# Patient Record
Sex: Male | Born: 1955 | Race: White | Hispanic: No | Marital: Married | Smoking: Former smoker
Health system: Southern US, Community
[De-identification: ages and names within clinical notes are randomized; demographics above are authoritative.]

## PROBLEM LIST (undated history)

## (undated) DIAGNOSIS — H269 Unspecified cataract: Secondary | ICD-10-CM

## (undated) DIAGNOSIS — Z8249 Family history of ischemic heart disease and other diseases of the circulatory system: Secondary | ICD-10-CM

## (undated) DIAGNOSIS — E119 Type 2 diabetes mellitus without complications: Secondary | ICD-10-CM

## (undated) DIAGNOSIS — G43909 Migraine, unspecified, not intractable, without status migrainosus: Secondary | ICD-10-CM

## (undated) DIAGNOSIS — E785 Hyperlipidemia, unspecified: Secondary | ICD-10-CM

## (undated) DIAGNOSIS — R7303 Prediabetes: Secondary | ICD-10-CM

## (undated) DIAGNOSIS — I739 Peripheral vascular disease, unspecified: Secondary | ICD-10-CM

## (undated) DIAGNOSIS — R569 Unspecified convulsions: Secondary | ICD-10-CM

## (undated) DIAGNOSIS — Z85828 Personal history of other malignant neoplasm of skin: Secondary | ICD-10-CM

## (undated) DIAGNOSIS — I10 Essential (primary) hypertension: Secondary | ICD-10-CM

## (undated) DIAGNOSIS — M1990 Unspecified osteoarthritis, unspecified site: Secondary | ICD-10-CM

## (undated) HISTORY — DX: Prediabetes: R73.03

## (undated) HISTORY — DX: Personal history of other malignant neoplasm of skin: Z85.828

## (undated) HISTORY — DX: Type 2 diabetes mellitus without complications: E11.9

## (undated) HISTORY — DX: Hyperlipidemia, unspecified: E78.5

## (undated) HISTORY — DX: Essential (primary) hypertension: I10

## (undated) HISTORY — DX: Peripheral vascular disease, unspecified: I73.9

## (undated) HISTORY — DX: Migraine, unspecified, not intractable, without status migrainosus: G43.909

## (undated) HISTORY — DX: Unspecified cataract: H26.9

---
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
## 1992-02-16 HISTORY — PX: SPINE SURGERY: SHX786

## 2010-04-14 DIAGNOSIS — E269 Hyperaldosteronism, unspecified: Secondary | ICD-10-CM | POA: Insufficient documentation

## 2011-10-30 ENCOUNTER — Ambulatory Visit: Payer: Managed Care, Other (non HMO) | Admitting: Emergency Medicine

## 2011-10-30 VITALS — BP 157/81 | HR 62 | Temp 97.5°F | Resp 16 | Ht 71.0 in | Wt 189.0 lb

## 2011-10-30 DIAGNOSIS — E119 Type 2 diabetes mellitus without complications: Secondary | ICD-10-CM

## 2011-10-30 DIAGNOSIS — I10 Essential (primary) hypertension: Secondary | ICD-10-CM

## 2011-10-30 LAB — COMPREHENSIVE METABOLIC PANEL
ALT: 23 U/L (ref 0–53)
BUN: 19 mg/dL (ref 6–23)
CO2: 25 mEq/L (ref 19–32)
Calcium: 9.9 mg/dL (ref 8.4–10.5)
Chloride: 107 mEq/L (ref 96–112)
Creat: 0.97 mg/dL (ref 0.50–1.35)

## 2011-10-30 LAB — LIPID PANEL
Cholesterol: 242 mg/dL — ABNORMAL HIGH (ref 0–200)
Total CHOL/HDL Ratio: 7.1 Ratio

## 2011-10-30 LAB — POCT GLYCOSYLATED HEMOGLOBIN (HGB A1C): Hemoglobin A1C: 5.2

## 2011-10-30 MED ORDER — LISINOPRIL 40 MG PO TABS: 40.0000 mg | ORAL_TABLET | Freq: Every day | ORAL | Status: DC

## 2011-10-30 MED ORDER — SPIRONOLACTONE 50 MG PO TABS: 50.0000 mg | ORAL_TABLET | Freq: Every day | ORAL | Status: DC

## 2011-10-30 MED ORDER — HYDRALAZINE HCL 50 MG PO TABS: 50.0000 mg | ORAL_TABLET | Freq: Three times a day (TID) | ORAL | Status: DC

## 2011-10-30 MED ORDER — METOPROLOL SUCCINATE ER 100 MG PO TB24: 100.0000 mg | ORAL_TABLET | Freq: Every day | ORAL | Status: DC

## 2011-10-30 MED ORDER — METFORMIN HCL 500 MG PO TABS: 500.0000 mg | ORAL_TABLET | Freq: Two times a day (BID) | ORAL | Status: DC

## 2011-10-30 NOTE — Progress Notes (Addendum)
   Date:  10/30/2011   Name:  Kyle [name]   DOB:  1955/11/17   MRN:  [id number] Gender: male Age: 56 y.o.  PCP:  No primary provider on file.    Chief Complaint: Establish Care and Hypertension   History of Present Illness:  Kyle [name] is a 56 y.o. pleasant patient who presents with the following:  History of "borderline" NIDDM and hypertension.  Wants to change from his current doctor that is "too far away".  Says that his blood pressure is always a little high.  Says that he had a normal A1C on his last testing one year ago.  Does not follow a particular diet and does not check his sugar at home.  Denies symptoms.  Reformed smoker.  There is no problem list on file for this patient.   No past medical history on file.  No past surgical history on file.  History  Substance Use Topics  . Smoking status: Former Games developer  . Smokeless tobacco: Not on file  . Alcohol Use: Not on file    No family history on file.  No Known Allergies  Medication list has been reviewed and updated.  No outpatient prescriptions prior to visit.    Review of Systems:  As per HPI, otherwise negative.    Physical Examination: Filed Vitals:   10/30/11 0947  BP: 157/81  Pulse: 62  Temp: 97.5 F (36.4 C)  Resp: 16   Filed Vitals:   10/30/11 0947  Height: 5\' 11"  (1.803 m)  Weight: 189 lb (85.73 kg)   Body mass index is 26.36 kg/(m^2). Ideal Body Weight: Weight in (lb) to have BMI = 25: 178.9   GEN: WDWN, NAD, Non-toxic, A & O x 3 HEENT: Atraumatic, Normocephalic. Neck supple. No masses, No LAD. Ears and Nose: No external deformity. CV: RRR, No M/G/R. No JVD. No thrill. No extra heart sounds. PULM: CTA B, no wheezes, crackles, rhonchi. No retractions. No resp. distress. No accessory muscle use. ABD: S, NT, ND, +BS. No rebound. No HSM. EXTR: No c/c/e NEURO Normal gait.  PSYCH: Normally interactive. Conversant. Not depressed or anxious appearing.  Calm demeanor.     Assessment and Plan:  Hypertension NIDDM Discussed importance of smoking cessation maintenance, dietary factors in NIDDM, and need to check his sugar daily and follow a diet. Refilled meds Changed to Toprol XL from lopressor Follow up in three months.   Carmelina Dane, MD I have reviewed and agree with documentation. Robert P. Merla Riches, M.D.

## 2011-11-02 MED ORDER — ROSUVASTATIN CALCIUM 20 MG PO TABS: 20.0000 mg | ORAL_TABLET | Freq: Every day | ORAL | Status: DC

## 2011-11-03 ENCOUNTER — Other Ambulatory Visit: Payer: Self-pay

## 2011-11-03 MED ORDER — HYDRALAZINE HCL 50 MG PO TABS: 50.0000 mg | ORAL_TABLET | Freq: Three times a day (TID) | ORAL | Status: DC

## 2011-11-03 MED ORDER — METOPROLOL SUCCINATE ER 100 MG PO TB24: 100.0000 mg | ORAL_TABLET | Freq: Every day | ORAL | Status: DC

## 2011-11-03 MED ORDER — METFORMIN HCL 500 MG PO TABS: 500.0000 mg | ORAL_TABLET | Freq: Two times a day (BID) | ORAL | Status: DC

## 2011-11-03 MED ORDER — SPIRONOLACTONE 50 MG PO TABS: 50.0000 mg | ORAL_TABLET | Freq: Every day | ORAL | Status: DC

## 2011-11-03 MED ORDER — LISINOPRIL 40 MG PO TABS: 40.0000 mg | ORAL_TABLET | Freq: Every day | ORAL | Status: DC

## 2011-11-07 ENCOUNTER — Telehealth: Payer: Self-pay

## 2011-11-07 NOTE — Telephone Encounter (Signed)
Pt states that medication was sent in mail for crestor, but dr was supposed to had prescribed another type of blood pressure medication. [phone]

## 2011-11-07 NOTE — Telephone Encounter (Signed)
Yes, his lopressor was changed to Toprol XL (metoprolol succinate) and that was sent to [facility] on 9/18.

## 2011-11-08 NOTE — Telephone Encounter (Signed)
Called pt unable to leave message.

## 2011-11-11 NOTE — Telephone Encounter (Signed)
Dr Dareen Piano, please see notes under pt's lab results. Pt stated he can not take Crestor or Lipitor d/t leg cramps and would like you to Rx something else.

## 2011-12-31 ENCOUNTER — Telehealth: Payer: Self-pay

## 2011-12-31 NOTE — Telephone Encounter (Signed)
Pt is needing nurse to contact him with his results, numbers when he had an A1C done, pt needs this info for personal purposes. [phone]

## 2011-12-31 NOTE — Telephone Encounter (Signed)
Patient notified and voiced understanding. Also requested copy mailed.

## 2012-01-20 ENCOUNTER — Telehealth: Payer: Self-pay

## 2012-01-20 NOTE — Telephone Encounter (Signed)
Pt is needing to talk about changing his medications because at beginning of year price will increase for blood pressure

## 2012-01-21 ENCOUNTER — Telehealth: Payer: Self-pay | Admitting: Family Medicine

## 2012-01-21 NOTE — Telephone Encounter (Signed)
Mr Kaushik called to speak with Amy about RX but will call to speak with her tomorrow

## 2012-01-21 NOTE — Telephone Encounter (Signed)
Left message for pt. To call back about this.

## 2012-01-22 NOTE — Telephone Encounter (Signed)
Pt called to speak with amy about his medication [phone]

## 2012-01-23 ENCOUNTER — Telehealth: Payer: Self-pay

## 2012-01-23 MED ORDER — METOPROLOL TARTRATE 50 MG PO TABS: 50.0000 mg | ORAL_TABLET | Freq: Two times a day (BID) | ORAL | Status: DC

## 2012-01-23 NOTE — Telephone Encounter (Signed)
Spoke with pt advised message from [place]. Pt understood and will recheck next Saturday.

## 2012-01-23 NOTE — Telephone Encounter (Signed)
Taking Toprol XL, through [facility]. Has gotten letter this medication not covered as of Jan 1st please advise on changing medication, they state they will cover regular Metoprolol, but no longer the Toprol XL

## 2012-01-23 NOTE — Telephone Encounter (Signed)
Please advise patient change has been made. They will need a recheck before they run out. Have them check their BP periodically on the new medication also, if it is running too high or too low RTC.

## 2012-01-23 NOTE — Telephone Encounter (Signed)
Patients wife called in regards to RX [facility]- [facility]- patient needs a low generic or low cost brand to replace it and have it covered by the insurance. Spoke to Amy a couple days ago. Patients wife just wants to get it resolved as soon as possible before his appointment. Thank you!

## 2012-02-06 ENCOUNTER — Other Ambulatory Visit: Payer: Self-pay | Admitting: Physician Assistant

## 2012-02-12 ENCOUNTER — Ambulatory Visit: Payer: Managed Care, Other (non HMO)

## 2012-03-17 ENCOUNTER — Ambulatory Visit: Payer: Managed Care, Other (non HMO) | Admitting: Emergency Medicine

## 2012-03-17 VITALS — BP 146/88 | HR 77 | Temp 98.0°F | Resp 16 | Ht 70.5 in | Wt 189.0 lb

## 2012-03-17 DIAGNOSIS — E782 Mixed hyperlipidemia: Secondary | ICD-10-CM

## 2012-03-17 DIAGNOSIS — E785 Hyperlipidemia, unspecified: Secondary | ICD-10-CM | POA: Insufficient documentation

## 2012-03-17 DIAGNOSIS — I10 Essential (primary) hypertension: Secondary | ICD-10-CM | POA: Insufficient documentation

## 2012-03-17 DIAGNOSIS — E119 Type 2 diabetes mellitus without complications: Secondary | ICD-10-CM | POA: Insufficient documentation

## 2012-03-17 LAB — BASIC METABOLIC PANEL
CO2: 28 mEq/L (ref 19–32)
Chloride: 106 mEq/L (ref 96–112)
Creat: 1.07 mg/dL (ref 0.50–1.35)
Potassium: 4.4 mEq/L (ref 3.5–5.3)

## 2012-03-17 LAB — POCT CBC
Granulocyte percent: 56 %G (ref 37–80)
HCT, POC: 45.5 % (ref 43.5–53.7)
Hemoglobin: 14.9 g/dL (ref 14.1–18.1)
Lymph, poc: 4.8 — AB (ref 0.6–3.4)
MCV: 91.4 fL (ref 80–97)
POC Granulocyte: 7.3 — AB (ref 2–6.9)
POC LYMPH PERCENT: 37.3 %L (ref 10–50)
RDW, POC: 13.9 %

## 2012-03-17 LAB — POCT GLYCOSYLATED HEMOGLOBIN (HGB A1C): Hemoglobin A1C: 5.6

## 2012-03-17 LAB — GLUCOSE, POCT (MANUAL RESULT ENTRY): POC Glucose: 62 mg/dl — AB (ref 70–99)

## 2012-03-17 NOTE — Progress Notes (Signed)
  Subjective:    Patient ID: Kyle [name], male    DOB: 22-Feb-1955, 57 y.o.   MRN: [id number]  HPI Pt here for check up. He was here In Sept 2013. They drew blood then for his DM. He has been taking his meds and checks his sugars when he has the strips. HE is a truckdriver. He has never had a colonoscopy. Patient has been to the [facility] in the past with hypertensive crisis. At times he has not had money to pay for his medications. He currently has been made with the takes medication and is trying to do better with his help  Review of Systems     Objective:   Physical Exam HEENT exam is unremarkable. His chest is clear to auscultation and percussion. Heart regular rate no murmurs. Abdomen soft nontender. Extremities without edema pulses are 2+ and symmetrical.  Results for orders placed in visit on 03/17/12  POCT CBC      Component Value Range   WBC 13.0 (*) 4.6 - 10.2 K/uL   Lymph, poc 4.8 (*) 0.6 - 3.4   POC LYMPH PERCENT 37.3  10 - 50 %L   MID (cbc) 0.9  0 - 0.9   POC MID % 6.7  0 - 12 %M   POC Granulocyte 7.3 (*) 2 - 6.9   Granulocyte percent 56.0  37 - 80 %G   RBC 4.98  4.69 - 6.13 M/uL   Hemoglobin 14.9  14.1 - 18.1 g/dL   HCT, POC 40.9  81.1 - 53.7 %   MCV 91.4  80 - 97 fL   MCH, POC 29.9  27 - 31.2 pg   MCHC 32.7  31.8 - 35.4 g/dL   RDW, POC 91.4     Platelet Count, POC 296  142 - 424 K/uL   MPV 11.3  0 - 99.8 fL  GLUCOSE, POCT (MANUAL RESULT ENTRY)      Component Value Range   POC Glucose 62 (*) 70 - 99 mg/dl  POCT GLYCOSYLATED HEMOGLOBIN (HGB A1C)      Component Value Range   Hemoglobin A1C 5.6          Assessment & Plan:

## 2012-03-24 ENCOUNTER — Encounter: Payer: Self-pay | Admitting: *Deleted

## 2012-04-04 ENCOUNTER — Encounter (INDEPENDENT_AMBULATORY_CARE_PROVIDER_SITE_OTHER): Payer: Managed Care, Other (non HMO)

## 2012-04-04 ENCOUNTER — Ambulatory Visit (INDEPENDENT_AMBULATORY_CARE_PROVIDER_SITE_OTHER): Payer: Managed Care, Other (non HMO) | Admitting: Cardiology

## 2012-04-04 ENCOUNTER — Encounter: Payer: Self-pay | Admitting: Cardiology

## 2012-04-04 VITALS — BP 162/90 | HR 60 | Ht 70.5 in | Wt 193.0 lb

## 2012-04-04 DIAGNOSIS — I10 Essential (primary) hypertension: Secondary | ICD-10-CM

## 2012-04-04 DIAGNOSIS — E7800 Pure hypercholesterolemia, unspecified: Secondary | ICD-10-CM

## 2012-04-04 DIAGNOSIS — E785 Hyperlipidemia, unspecified: Secondary | ICD-10-CM

## 2012-04-04 DIAGNOSIS — I739 Peripheral vascular disease, unspecified: Secondary | ICD-10-CM

## 2012-04-04 DIAGNOSIS — Z9189 Other specified personal risk factors, not elsewhere classified: Secondary | ICD-10-CM

## 2012-04-04 MED ORDER — PRAVASTATIN SODIUM 20 MG PO TABS: 20.0000 mg | ORAL_TABLET | Freq: Every evening | ORAL | Status: DC

## 2012-04-04 MED ORDER — HYDRALAZINE HCL 100 MG PO TABS: 100.0000 mg | ORAL_TABLET | Freq: Three times a day (TID) | ORAL | Status: DC

## 2012-04-04 NOTE — Progress Notes (Signed)
Patient ID: Kyle [name], male   DOB: 04/03/1955, 57 y.o.   MRN: [id number] PCP: Dr. Cleta Alberts  57 yo with history of PAD, resistant HTN, type II diabetes, and hyperlipidemia presents for cardiology evaluation.  Patient has had a long history of HTN.  He was told by a doctor in [place] that he had hyperaldosteronism so was started on spironolactone.  SBP at home ranges from the 120s-160s when he checks on his home cuff.  It is high today.  Additionally, he used to get severe pain in the right calf with ambulation.  He was diagnosed with PAD noninvasively by a doctor in [place].  He says that he started a walking program and the pain considerably improved.  Currently, he really is not getting symptoms that would be concerning for claudication.  He has been on Crestor and Lipitor in the past for hyperlipidemia but has been unable to tolerate them due to myalgias.  With last lipid check, triglycerides were very high.  Fish oil was increased to 6000 mg daily.    Patient denies chest pain or exertional dyspnea.  He does not get formal exercise but does not have any limitations that he can think of.  He says he had a stress test > 10 years ago that was "normal."    ECG: NSR, normal  Labs (9/13): TGs 890, HDL 34 Labs (1/14): K 4.4, creatinine 1.07  PMH: 1. PAD: Diagnosed in [place], greater involvement of right leg.  I do not have full details of prior workup.  Never had peripheral angiogram.  2. HTN: Resistant.  He was told by a doctor in [place] that he had hyperaldosteronism and was started on spironolactone.  3. Type II diabetes 4. Hyperlipidemia: Leg cramps with Lipitor and Crestor.  Very high triglycerides.   SH: Truck driver Ambulance person), lives in [place].  Originally from [place].  Quit smoking in 2009.   FH: No premature CAD.  ROS: All systems reviewed and negative except as per HPI.   Current Outpatient Prescriptions  Medication Sig Dispense Refill  . aspirin 81 MG  tablet Take 81 mg by mouth daily.      . fish oil-omega-3 fatty acids 1000 MG capsule Take 2 g by mouth daily.      Marland Kitchen lisinopril (PRINIVIL,ZESTRIL) 40 MG tablet Take 1 tablet (40 mg total) by mouth daily.  90 tablet  0  . metFORMIN (GLUCOPHAGE) 500 MG tablet Take 1 tablet (500 mg total) by mouth 2 (two) times daily with a meal.  180 tablet  0  . metoprolol succinate (TOPROL-XL) 100 MG 24 hr tablet Take 1 tablet (100 mg total) by mouth daily. Take with or immediately following a meal.  90 tablet  0  . spironolactone (ALDACTONE) 50 MG tablet Take 1 tablet (50 mg total) by mouth daily. Need office visit for additional refills.  90 tablet  0  . Coenzyme Q10 200 MG TABS Take by mouth.      . hydrALAZINE (APRESOLINE) 100 MG tablet Take 1 tablet (100 mg total) by mouth 3 (three) times daily.  270 tablet  2  . pravastatin (PRAVACHOL) 20 MG tablet Take 1 tablet (20 mg total) by mouth every evening.  90 tablet  3   No current facility-administered medications for this visit.    BP 162/90  Pulse 60  Ht 5' 10.5" (1.791 m)  Wt 193 lb (87.544 kg)  BMI 27.29 kg/m2 General: NAD Neck: No JVD, no thyromegaly or thyroid nodule.  Lungs: Clear  to auscultation bilaterally with normal respiratory effort. CV: Nondisplaced PMI.  Heart regular S1/S2, no S3/S4, no murmur.  No peripheral edema.  No carotid bruit.  Trace right PT pulse, 2+ left PT pulse.   Abdomen: Soft, nontender, no hepatosplenomegaly, no distention.  Skin: Intact without lesions or rashes.  Neurologic: Alert and oriented x 3.  Psych: Normal affect. Extremities: No clubbing or cyanosis.  HEENT: Normal.   Assessment/Plan: 1. PAD: Patient has history of PAD with no recent evaluation.   He used to have significant claudication but this has improved considerably.  He is no longer smoking.  The pulses in his right foot are considerably weaker than the left foot.  - I will get ABIs to assess degree of PAD. - Continue ASA, needs to start statin given  vascular disease history.  2. HTN: Resistant.  He is on multiple meds but BP still tends to run high.  He has been told that he has hyperaldosteronism and is on spironolactone.   - Increase hydralazine to 100 mg tid.  - Check renal artery dopplers to rule out renal artery stenosis.  3. Hyperlipidemia: Should be on a statin with known vascular disease but has had myalgias with Lipitor and Crestor.  I will have him try a low dose of pravastatin (20 mg daily) along with coenzyme Q10 200 mg daily to see if he can tolerate this.  He recently increased fish oil for very high triglycerides.  The level of hypertriglyceridemia that he had risks acute pancreatitis.  I will repeat lipids/LFTs in 2 months.  If TGs still high, will need to add fenofibrate.   4. CAD: Multiple CAD risk factors (PAD, HTN, hyperlipidemia, DM).  I will get an ETT (no imaging) to risk stratify. I would like him to exercise more.  If ETT looks ok, he should try to start a regular walking program.   Marca Ancona 04/04/2012 2:37 PM

## 2012-04-04 NOTE — Patient Instructions (Addendum)
Increase hydralazine to 100mg  three times a day.   Start pravastatin (pravachol) 20mg  daily.   Take coenzyme Q10 200mg  daily.   Your physician has requested that you have an exercise tolerance test. For further information please visit [email or web address] Please also follow instruction sheet, as given.  Your physician has requested that you have a lower extremity arterial duplex. This test is an ultrasound of the arteries in the legs . It looks at arterial blood flow in the legs. Allow one hour for Lower  Arterial scans. There are no restrictions or special instructions.  Your physician has requested that you have a renal artery duplex. During this test, an ultrasound is used to evaluate blood flow to the kidneys. Allow one hour for this exam. Do not eat after midnight the day before and avoid carbonated beverages. Take your medications as you usually do.    Your physician recommends that you return for a FASTING lipid profile /liver profile/BMEt in 2 months.  Your physician wants you to follow-up in: 6 months with Dr Shirlee Latch. (August 2014). You will receive a reminder letter in the mail two months in advance. If you don't receive a letter, please call our office to schedule the follow-up appointment.

## 2012-04-09 ENCOUNTER — Other Ambulatory Visit: Payer: Self-pay | Admitting: Physician Assistant

## 2012-04-10 ENCOUNTER — Telehealth: Payer: Self-pay | Admitting: Cardiology

## 2012-04-12 ENCOUNTER — Encounter (INDEPENDENT_AMBULATORY_CARE_PROVIDER_SITE_OTHER): Payer: Managed Care, Other (non HMO)

## 2012-04-12 DIAGNOSIS — E7800 Pure hypercholesterolemia, unspecified: Secondary | ICD-10-CM

## 2012-04-12 DIAGNOSIS — I10 Essential (primary) hypertension: Secondary | ICD-10-CM

## 2012-04-12 DIAGNOSIS — I739 Peripheral vascular disease, unspecified: Secondary | ICD-10-CM

## 2012-04-16 ENCOUNTER — Other Ambulatory Visit: Payer: Self-pay | Admitting: Physician Assistant

## 2012-04-17 ENCOUNTER — Encounter: Payer: Self-pay | Admitting: Nurse Practitioner

## 2012-04-17 ENCOUNTER — Ambulatory Visit (INDEPENDENT_AMBULATORY_CARE_PROVIDER_SITE_OTHER): Payer: Managed Care, Other (non HMO) | Admitting: Nurse Practitioner

## 2012-04-17 VITALS — BP 175/80 | HR 83

## 2012-04-17 DIAGNOSIS — E7800 Pure hypercholesterolemia, unspecified: Secondary | ICD-10-CM

## 2012-04-17 DIAGNOSIS — I739 Peripheral vascular disease, unspecified: Secondary | ICD-10-CM

## 2012-04-17 DIAGNOSIS — I10 Essential (primary) hypertension: Secondary | ICD-10-CM

## 2012-04-17 DIAGNOSIS — I472 Ventricular tachycardia, unspecified: Secondary | ICD-10-CM

## 2012-04-17 MED ORDER — AMLODIPINE BESYLATE 5 MG PO TABS: 5.0000 mg | ORAL_TABLET | Freq: Every day | ORAL | Status: DC

## 2012-04-17 NOTE — Progress Notes (Signed)
Exercise Treadmill Test  Pre-Exercise Testing Evaluation Rhythm: normal sinus  Rate: 64                 Test  Exercise Tolerance Test Ordering MD: Marca Ancona, MD  Interpreting MD: Norma Fredrickson, NP  Unique Test No: 1  Treadmill:  1  Indication for ETT: Ischemia Evaluation, PVD  Contraindication to ETT: No   Stress Modality: exercise - treadmill  Cardiac Imaging Performed: non   Protocol: standard Bruce - maximal  Max BP:  243/99  Max MPHR (bpm):  162 85% MPR (bpm):  138  MPHR obtained (bpm):  141 % MPHR obtained:  87%  Reached 85% MPHR (min:sec):  n/a Total Exercise Time (min-sec):  3:42  Workload in METS:  5.4 Borg Scale: 13  Reason ETT Terminated:  exaggerated hypertensive response    ST Segment Analysis At Rest: normal ST segments - no evidence of significant ST depression With Exercise: no evidence of significant ST depression  Other Information Arrhythmia:  Yes Angina during ETT:  absent (0) Quality of ETT:  non-diagnostic  ETT Interpretation:  normal - no evidence of ischemia by ST analysis but with poor exercise tolerance, hypertensive response and frequent PVC's and short runs (3 to 4 beats)  Comments: Patient presents today for routine GXT. Has no known CAD but multiple CV risk factors. No smoking since 2009.  Patient exercised today on the standard Bruce protocol. He exercised for only 3:42. Test was stopped by myself due to marked hypertension and PVC's. Reduced exercise tolerance. Marked hypertensive BP response. Clinically negative. EKG negative for ischemia but with PVCs and short 3 to 4 beat runs.   Recommendations: Will arrange for Lexiscan - not to hold his beta blocker Will arrange echo as well  See Dr. Shirlee Latch in one month  Addendum: At the end of the procedure, patient developed SVT - rate up to 200. Code Blue was called. Patient remains alert and responsive during the entire episode.  Rhythm broke with vagal maneuvers (coughing and bearing down).  BP remains markedly high (up to 215/138). Lopressor 50 mg given po. Will need to discuss with Dr. Shirlee Latch as to cath vs Myoview testing. BP has dropped to 199/104  Patient was subsequently seen by Dr. Shirlee Latch. He has advised Lexiscan. 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   Call the [facility] office at [phone] if you have any questions, problems or concerns.

## 2012-04-17 NOTE — Patient Instructions (Addendum)
We need to get an ultrasound of your heart and a stress test Kyle [name])  Do not hold your Toprol for this next stress test  We are going to add Norvasc 5 mg a day - take this along with your other medicines  Take your Toprol when you get home  See Dr. Shirlee Latch in one month  Call the [facility] office at [phone] if you have any questions, problems or concerns.

## 2012-04-23 ENCOUNTER — Other Ambulatory Visit: Payer: Self-pay | Admitting: Physician Assistant

## 2012-05-01 ENCOUNTER — Ambulatory Visit (HOSPITAL_COMMUNITY): Payer: Managed Care, Other (non HMO) | Attending: Cardiology

## 2012-05-01 DIAGNOSIS — I079 Rheumatic tricuspid valve disease, unspecified: Secondary | ICD-10-CM | POA: Insufficient documentation

## 2012-05-01 DIAGNOSIS — E119 Type 2 diabetes mellitus without complications: Secondary | ICD-10-CM | POA: Insufficient documentation

## 2012-05-01 DIAGNOSIS — I059 Rheumatic mitral valve disease, unspecified: Secondary | ICD-10-CM | POA: Insufficient documentation

## 2012-05-01 DIAGNOSIS — E7800 Pure hypercholesterolemia, unspecified: Secondary | ICD-10-CM

## 2012-05-01 DIAGNOSIS — I739 Peripheral vascular disease, unspecified: Secondary | ICD-10-CM

## 2012-05-01 DIAGNOSIS — I471 Supraventricular tachycardia, unspecified: Secondary | ICD-10-CM | POA: Insufficient documentation

## 2012-05-01 DIAGNOSIS — I10 Essential (primary) hypertension: Secondary | ICD-10-CM

## 2012-05-01 DIAGNOSIS — I472 Ventricular tachycardia, unspecified: Secondary | ICD-10-CM

## 2012-05-01 DIAGNOSIS — I379 Nonrheumatic pulmonary valve disorder, unspecified: Secondary | ICD-10-CM | POA: Insufficient documentation

## 2012-05-01 NOTE — Progress Notes (Signed)
Echocardiogram performed.  

## 2012-05-08 ENCOUNTER — Ambulatory Visit (HOSPITAL_COMMUNITY): Payer: Managed Care, Other (non HMO) | Attending: Cardiovascular Disease | Admitting: Radiology

## 2012-05-08 VITALS — BP 179/95 | HR 67 | Ht 70.5 in | Wt 185.0 lb

## 2012-05-08 DIAGNOSIS — R9439 Abnormal result of other cardiovascular function study: Secondary | ICD-10-CM

## 2012-05-08 DIAGNOSIS — I472 Ventricular tachycardia, unspecified: Secondary | ICD-10-CM

## 2012-05-08 DIAGNOSIS — I10 Essential (primary) hypertension: Secondary | ICD-10-CM

## 2012-05-08 DIAGNOSIS — E119 Type 2 diabetes mellitus without complications: Secondary | ICD-10-CM | POA: Insufficient documentation

## 2012-05-08 DIAGNOSIS — E7800 Pure hypercholesterolemia, unspecified: Secondary | ICD-10-CM

## 2012-05-08 DIAGNOSIS — I739 Peripheral vascular disease, unspecified: Secondary | ICD-10-CM

## 2012-05-08 DIAGNOSIS — R079 Chest pain, unspecified: Secondary | ICD-10-CM | POA: Insufficient documentation

## 2012-05-08 MED ORDER — TECHNETIUM TC 99M SESTAMIBI GENERIC - CARDIOLITE
33.0000 | Freq: Once | INTRAVENOUS | Status: AC | PRN
  Administered 2012-05-08: 33 via INTRAVENOUS

## 2012-05-08 MED ORDER — REGADENOSON 0.4 MG/5ML IV SOLN
0.4000 mg | Freq: Once | INTRAVENOUS | Status: AC
  Administered 2012-05-08: 0.4 mg via INTRAVENOUS

## 2012-05-08 MED ORDER — TECHNETIUM TC 99M SESTAMIBI GENERIC - CARDIOLITE
11.0000 | Freq: Once | INTRAVENOUS | Status: AC | PRN
  Administered 2012-05-08: 11 via INTRAVENOUS

## 2012-05-08 NOTE — Progress Notes (Signed)
[facility] SITE 3 NUCLEAR MED [address] [place], [place] [address] [phone]    Cardiology Nuclear Med Study  Kyle [name] is a 57 y.o. male     MRN : [id number]     DOB: 02-08-1956  Procedure Date: 05/08/2012  Nuclear Med Background Indication for Stress Test:  Evaluation for Ischemia and Abnormal GXT History:  3/14 Echo:EF=65%; 3/14 GXT: Cardiac Risk Factors: Claudication, History of Smoking, Hypertension, Lipids, NIDDM and PVD  Symptoms:  Chest Pressure with Rapid HR.  (last episode of chest discomfort was with GXT on 04/17/12 during SVT in recovery)    Nuclear Pre-Procedure Caffeine/Decaff Intake:  None NPO After: 7:00pm   Lungs:  Clear. O2 Sat: 99% on room air. IV 0.9% NS with Angio Cath:  20g  IV Site: R Antecubital  IV Started by:  Stanton Kidney, EMT-P  Chest Size (in):  42 Cup Size: n/a  Height: 5' 10.5" (1.791 m)  Weight:  185 lb (83.915 kg)  BMI:  Body mass index is 26.16 kg/(m^2). Tech Comments:  Metoprolol was taken at 7am, as directed.    Nuclear Med Study 1 or 2 day study: 1 day  Stress Test Type:  Eugenie Birks  Reading MD: Charlton Haws, MD  Order Authorizing Provider:  Marca Ancona, MD  Resting Radionuclide: Technetium 29m Sestamibi  Resting Radionuclide Dose: 11.0 mCi   Stress Radionuclide:  Technetium 29m Sestamibi  Stress Radionuclide Dose: 33.0 mCi           Stress Protocol Rest HR: 67 Stress HR: 110  Rest BP: 179/95 Stress BP: 181/83  Exercise Time (min): n/a METS: n/a   Predicted Max HR: 162 bpm % Max HR: 67.9 bpm Rate Pressure Product: 21308   Dose of Adenosine (mg):  n/a Dose of Lexiscan: 0.4 mg  Dose of Atropine (mg): n/a Dose of Dobutamine: n/a mcg/kg/min (at max HR)  Stress Test Technologist: Smiley Houseman, CMA-N  Nuclear Technologist:  Domenic Polite, CNMT     Rest Procedure:  Myocardial perfusion imaging was performed at rest 45 minutes following the intravenous administration of Technetium 36m Sestamibi.  Rest ECG:  NSR - Normal EKG  Stress Procedure:  The patient received IV Lexiscan 0.4 mg over 15-seconds.  Technetium 37m Sestamibi injected at 30-seconds.  He c/o chest pressure with Lexiscan, relieved in recovery.  Quantitative spect images were obtained after a 45 minute delay.  Stress ECG: No significant change from baseline ECG  QPS Raw Data Images:  Patient motion noted. Stress Images:  Normal homogeneous uptake in all areas of the myocardium. Rest Images:  Normal homogeneous uptake in all areas of the myocardium. Subtraction (SDS):  Normal Transient Ischemic Dilatation (Normal <1.22):  1.01 Lung/Heart Ratio (Normal <0.45):  0.29  Quantitative Gated Spect Images QGS EDV:  102 ml QGS ESV:  40 ml  Impression Exercise Capacity:  Lexiscan with no exercise. BP Response:  Normal blood pressure response. Clinical Symptoms:  There is dyspnea. ECG Impression:  No significant ST segment change suggestive of ischemia. Comparison with Prior Nuclear Study: No previous nuclear study performed  Overall Impression:  Normal stress nuclear study.  LV Ejection Fraction: 61%.  LV Wall Motion:  NL LV Function; NL Wall Motion  Charlton Haws

## 2012-05-10 ENCOUNTER — Telehealth: Payer: Self-pay | Admitting: Cardiology

## 2012-05-10 NOTE — Telephone Encounter (Signed)
New problem ° ° ° °Pt was returning your phone call. °

## 2012-05-10 NOTE — Telephone Encounter (Signed)
Spoke with pt

## 2012-05-20 ENCOUNTER — Ambulatory Visit: Payer: Managed Care, Other (non HMO)

## 2012-05-20 ENCOUNTER — Ambulatory Visit: Payer: Managed Care, Other (non HMO) | Admitting: Family Medicine

## 2012-05-20 VITALS — BP 149/75 | HR 79 | Temp 98.5°F | Resp 16 | Ht 71.5 in | Wt 187.0 lb

## 2012-05-20 DIAGNOSIS — M25569 Pain in unspecified knee: Secondary | ICD-10-CM

## 2012-05-20 DIAGNOSIS — M25562 Pain in left knee: Secondary | ICD-10-CM

## 2012-05-20 DIAGNOSIS — M25559 Pain in unspecified hip: Secondary | ICD-10-CM

## 2012-05-20 DIAGNOSIS — M25552 Pain in left hip: Secondary | ICD-10-CM

## 2012-05-20 MED ORDER — HYDROCODONE-ACETAMINOPHEN 5-325 MG PO TABS: 1.0000 | ORAL_TABLET | Freq: Three times a day (TID) | ORAL | Status: DC | PRN

## 2012-05-20 NOTE — Progress Notes (Signed)
[facility] [address] [place] [address] [phone]  Date:  05/20/2012   Name:  Kyle [name]   DOB:  [birth year]   MRN:  [id number]  PCP:  Carmelina Dane, MD    Chief Complaint: Knee Pain   History of Present Illness:  Kyle [name] is a 57 y.o. very pleasant male patient who presents with the following:  He is here with left knee pain for a few weeks- it has been hurting more for the last 3 days.  It hurts from the knee up to the left hip.  The right knee is ok.  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  He has not had this in the past.   The knee is painful even at rest- heat and ice do help some.  He will be quite stiff when he first rises and then loosen up with a few steps.  There is no known injury No past history of gout.  He has not noted fevers or chills.   He has had a discectomy in the past- this did help him.  His disc was causing pain in his left leg.    Patient Active Problem List  Diagnosis  . Hypertension  . Diabetes  . Hyperlipidemia  . PAD (peripheral artery disease)  . Cardiovascular risk factor    Past Medical History  Diagnosis Date  . History of basal cell carcinoma of skin     shoulder  . Prediabetes   . Hypertension   . HLD (hyperlipidemia)     Past Surgical History  Procedure Laterality Date  . Spine surgery  1994  . Tonsillectomy  1961    History  Substance Use Topics  . Smoking status: Former Smoker    Types: Cigarettes    Start date: 11/05/2007  . Smokeless tobacco: Not on file  . Alcohol Use: No    Family History  Problem Relation Age of Onset  . Diabetes Father     Allergies  Allergen Reactions  . Statins     Leg cramps    Medication list has been reviewed and updated.  Current Outpatient Prescriptions on File Prior to Visit  Medication Sig Dispense Refill  . amLODipine (NORVASC) 5 MG tablet Take 1 tablet (5 mg total) by mouth daily.  30 tablet  3  .  aspirin 81 MG tablet Take 81 mg by mouth daily.      . Coenzyme Q10 200 MG TABS Take by mouth.      . fish oil-omega-3 fatty acids 1000 MG capsule Take 2 g by mouth daily.      . hydrALAZINE (APRESOLINE) 100 MG tablet Take 1 tablet (100 mg total) by mouth 3 (three) times daily.  270 tablet  2  . lisinopril (PRINIVIL,ZESTRIL) 40 MG tablet TAKE 1 TABLET DAILY  90 tablet  0  . metFORMIN (GLUCOPHAGE) 500 MG tablet TAKE 1 TABLET TWICE A DAY WITH MEALS  180 tablet  1  . metoprolol (LOPRESSOR) 50 MG tablet Take 1 tablet (50 mg total) by mouth 2 (two) times daily. Need office visit for additional refills. 2nd notice.  180 tablet  0  . metoprolol succinate (TOPROL-XL) 100 MG 24 hr tablet Take 1 tablet (100 mg total) by mouth daily. Take with or immediately following a meal.  90 tablet  0  . spironolactone (ALDACTONE) 50 MG tablet Take 1 tablet (50 mg total) by mouth daily. Need office  visit for additional refills. 2nd notice.  90 tablet  0  . pravastatin (PRAVACHOL) 20 MG tablet Take 1 tablet (20 mg total) by mouth every evening.  90 tablet  3   No current facility-administered medications on file prior to visit.    Review of Systems:  As per HPI- otherwise negative.   Physical Examination: Filed Vitals:   05/20/12 1328  BP: 149/75  Pulse: 79  Temp: 98.5 F (36.9 C)  Resp: 16   Filed Vitals:   05/20/12 1328  Height: 5' 11.5" (1.816 m)  Weight: 187 lb (84.823 kg)   Body mass index is 25.72 kg/(m^2). Ideal Body Weight: Weight in (lb) to have BMI = 25: 181.4  GEN: WDWN, NAD, Non-toxic, A & O x 3 HEENT: Atraumatic, Normocephalic. Neck supple. No masses, No LAD. Ears and Nose: No external deformity. CV: RRR, No M/G/R. No JVD. No thrill. No extra heart sounds. PULM: CTA B, no wheezes, crackles, rhonchi. No retractions. No resp. distress. No accessory muscle use. EXTR: No c/c/e NEURO very stiff gait for the first few steps after sitting, then improves.  Slightly decreased patellar DTR on  the left which he relates is stable since his back surgery PSYCH: Normally interactive. Conversant. Not depressed or anxious appearing.  Calm demeanor.  Right knee: negative exam except for slight tenderness which is inconsitent.  The knee has full ROM, good flexion, no swelling/ edema/ effusion/ redness or heat.  Ligaments are stable The thigh also seems to have some tenderness which moves on re-exam.  There is no swelling or redness noted.   Left hip is non- tender over the greater trochanter, normal internal and external rotation and flexion.   [facility] reading (PRIMARY) by  Dr. Patsy Lager. Left ZOX:WRUE OA changes Left femur: negative Left knee:negative  LEFT KNEE - COMPLETE 4+ VIEW  Comparison: Femur film and hip films same date  Findings: Mild lateral patellofemoral joint space narrowing.  No fracture or dislocation.  Small sclerotic focus probably a bone island proximal tibia.  IMPRESSION: Mild lateral patellofemoral joint space narrowing.  No fracture or dislocation.  Small sclerotic focus probably a bone island proximal tibia.  LEFT FEMUR - 2 VIEW  Comparison: Hip films and knee films same date.  Findings: Mild left hip joint degenerative changes. No fracture or dislocation.  IMPRESSION: Mild left hip joint degenerative changes.  No fracture or dislocation.  LEFT HIP - COMPLETE 2+ VIEW  Comparison: None.  Findings: Mild left hip joint degenerative changes. No evidence of fracture or dislocation. No plain film evidence of avascular necrosis.  IMPRESSION: Mild left hip joint degenerative changes.   Assessment and Plan: Pain in left knee - Plan: DG Femur Left, DG Knee Complete 4 Views Left, HYDROcodone-acetaminophen (NORCO/VICODIN) 5-325 MG per tablet  Pain in left hip - Plan: DG Hip Complete Left, HYDROcodone-acetaminophen (NORCO/VICODIN) 5-325 MG per tablet  Noted mild arthritis on x-rays.  Edu relates that his pain is enough to cause difficulty with  walking. Will give a small supply of norco, rest, ice, and elevate knee.  If not better in the next couple of days plan ortho referral as his symptoms are somewhat unusual   Signed Abbe Amsterdam, MD

## 2012-05-20 NOTE — Patient Instructions (Addendum)
Please get in touch with me on Monday- if you are not doing a good bit better I will refer you to an orthopedist.  We are open tomorrow if you get worse. Do not use the pain medicine when you need to drive as it can cause drowsiness

## 2012-05-22 ENCOUNTER — Ambulatory Visit: Payer: Managed Care, Other (non HMO) | Admitting: Cardiology

## 2012-05-22 ENCOUNTER — Telehealth: Payer: Self-pay | Admitting: Radiology

## 2012-05-22 DIAGNOSIS — M25569 Pain in unspecified knee: Secondary | ICD-10-CM

## 2012-05-22 NOTE — Telephone Encounter (Signed)
To you FYI , pt states his knee is not better, per your note, ortho referral made. I have also provided him a work note, per his request. 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

## 2012-05-24 ENCOUNTER — Telehealth: Payer: Self-pay

## 2012-05-24 NOTE — Telephone Encounter (Signed)
Have given request to xray.

## 2012-05-24 NOTE — Telephone Encounter (Signed)
Pt has an appt with dr Darrelyn Hillock at [phone] tomorrow and needs xrays of his knees   Best number [phone] once ready to pick up

## 2012-05-29 ENCOUNTER — Other Ambulatory Visit (INDEPENDENT_AMBULATORY_CARE_PROVIDER_SITE_OTHER): Payer: Managed Care, Other (non HMO)

## 2012-05-29 ENCOUNTER — Telehealth: Payer: Self-pay | Admitting: Family Medicine

## 2012-05-29 DIAGNOSIS — I739 Peripheral vascular disease, unspecified: Secondary | ICD-10-CM

## 2012-05-29 DIAGNOSIS — I10 Essential (primary) hypertension: Secondary | ICD-10-CM

## 2012-05-29 DIAGNOSIS — E7800 Pure hypercholesterolemia, unspecified: Secondary | ICD-10-CM

## 2012-05-29 LAB — BASIC METABOLIC PANEL
BUN: 21 mg/dL (ref 6–23)
Calcium: 9.1 mg/dL (ref 8.4–10.5)
Creatinine, Ser: 1 mg/dL (ref 0.4–1.5)
GFR: 81.34 mL/min (ref >60.00)
Glucose, Bld: 121 mg/dL — ABNORMAL HIGH (ref 70–99)

## 2012-05-29 LAB — HEPATIC FUNCTION PANEL
ALT: 28 U/L (ref 0–53)
AST: 17 U/L (ref 0–37)
Bilirubin, Direct: 0 mg/dL (ref 0.0–0.3)
Total Bilirubin: 0.4 mg/dL (ref 0.3–1.2)

## 2012-05-29 LAB — LIPID PANEL: Triglycerides: 756 mg/dL — ABNORMAL HIGH (ref 0.0–149.0)

## 2012-05-29 NOTE — Telephone Encounter (Signed)
Received notes from [facility] ortho and discussed with the PA who saw this patient- they are not concerned about the probably bone island seen on his x-ray.  Will send a letter to pt

## 2012-05-30 ENCOUNTER — Telehealth: Payer: Self-pay | Admitting: Cardiology

## 2012-05-30 DIAGNOSIS — E7800 Pure hypercholesterolemia, unspecified: Secondary | ICD-10-CM

## 2012-05-30 MED ORDER — FENOFIBRATE 145 MG PO TABS: 145.0000 mg | ORAL_TABLET | Freq: Every day | ORAL | Status: DC

## 2012-05-30 NOTE — Telephone Encounter (Signed)
Called patient to report lab results and order from Dr. Shirlee Latch to begin taking Fenofibrate.  Rx sent to [facility].  Patient verbalized agreement with plan of care including repeat lipid panel in 2 months.  Patient requested cholesterol results and states he is not putting another statin in his body.  Patient states DM is not aware that he has stopped his pravastatin.

## 2012-07-11 ENCOUNTER — Other Ambulatory Visit: Payer: Self-pay | Admitting: Physician Assistant

## 2012-07-30 ENCOUNTER — Other Ambulatory Visit: Payer: Self-pay | Admitting: Physician Assistant

## 2012-07-31 NOTE — Telephone Encounter (Signed)
Needs OV, labs 

## 2012-09-03 ENCOUNTER — Other Ambulatory Visit: Payer: Self-pay | Admitting: Physician Assistant

## 2012-09-11 ENCOUNTER — Ambulatory Visit: Payer: Managed Care, Other (non HMO) | Admitting: Emergency Medicine

## 2012-09-11 VITALS — BP 150/80 | HR 60 | Temp 98.0°F | Resp 16 | Ht 70.0 in | Wt 198.0 lb

## 2012-09-11 DIAGNOSIS — E119 Type 2 diabetes mellitus without complications: Secondary | ICD-10-CM

## 2012-09-11 DIAGNOSIS — Z0000 Encounter for general adult medical examination without abnormal findings: Secondary | ICD-10-CM

## 2012-09-11 DIAGNOSIS — I10 Essential (primary) hypertension: Secondary | ICD-10-CM

## 2012-09-11 LAB — POCT CBC
Hemoglobin: 14 g/dL — AB (ref 14.1–18.1)
Lymph, poc: 3.2 (ref 0.6–3.4)
MCH, POC: 30.9 pg (ref 27–31.2)
MCHC: 32.7 g/dL (ref 31.8–35.4)
MID (cbc): 0.7 (ref 0–0.9)
MPV: 11.1 fL (ref 0–99.8)
POC MID %: 7.3 %M (ref 0–12)
WBC: 9.6 10*3/uL (ref 4.6–10.2)

## 2012-09-11 NOTE — Progress Notes (Signed)
  Subjective:    Patient here for follow-up of elevated blood pressure.  He is not exercising and is adherent to a low-salt diet.  Blood pressure is not well controlled at home. Cardiac symptoms: none. Patient denies: chest pain, chest pressure/discomfort, claudication, dyspnea, exertional chest pressure/discomfort, fatigue, irregular heart beat, lower extremity edema and near-syncope. Cardiovascular risk factors: advanced age (older than 76 for men, 67 for women), diabetes mellitus, hypertension, male gender, obesity (BMI >= 30 kg/m2) and sedentary lifestyle. Use of agents associated with hypertension: none. History of target organ damage: peripheral artery disease.  The following portions of the patient's history were reviewed and updated as appropriate: allergies, current medications, past family history, past medical history, past social history, past surgical history and problem list.  Review of Systems A comprehensive review of systems was negative.     Objective:    BP 150/80  Pulse 60  Temp(Src) 98 F (36.7 C) (Oral)  Resp 16  Ht 5\' 10"  (1.778 m)  Wt 198 lb (89.812 kg)  BMI 28.41 kg/m2  SpO2 97%  General Appearance:    Alert, cooperative, no distress, appears stated age  Head:    Normocephalic, without obvious abnormality, atraumatic  Eyes:    PERRL, conjunctiva/corneas clear, EOM's intact, fundi    benign, both eyes       Ears:    Normal TM's and external ear canals, both ears  Nose:   Nares normal, septum midline, mucosa normal, no drainage    or sinus tenderness  Throat:   Lips, mucosa, and tongue normal; teeth and gums normal  Neck:   Supple, symmetrical, trachea midline, no adenopathy;       thyroid:  No enlargement/tenderness/nodules; no carotid   bruit or JVD  Back:     Symmetric, no curvature, ROM normal, no CVA tenderness  Lungs:     Clear to auscultation bilaterally, respirations unlabored  Chest wall:    No tenderness or deformity  Heart:    Regular rate and  rhythm, S1 and S2 normal, no murmur, rub   or gallop  Abdomen:     Soft, non-tender, bowel sounds active all four quadrants,    no masses, no organomegaly        Extremities:   Extremities normal, atraumatic, no cyanosis or edema  Pulses:   2+ and symmetric all extremities  Skin:   Skin color, texture, turgor normal, no rashes or lesions  Lymph nodes:   Cervical, supraclavicular, and axillary nodes normal  Neurologic:   CNII-XII intact. Normal strength, sensation and reflexes      throughout      Assessment:    Hypertension, normal blood pressure . Evidence of target organ damage: peripheral artery disease.    Plan:    Medication: no change.

## 2012-09-12 ENCOUNTER — Telehealth: Payer: Self-pay

## 2012-09-12 LAB — COMPREHENSIVE METABOLIC PANEL
Alkaline Phosphatase: 40 U/L (ref 39–117)
BUN: 22 mg/dL (ref 6–23)
Glucose, Bld: 112 mg/dL — ABNORMAL HIGH (ref 70–99)
Sodium: 137 mEq/L (ref 135–145)
Total Bilirubin: 0.4 mg/dL (ref 0.3–1.2)

## 2012-09-12 LAB — LIPID PANEL
HDL: 35 mg/dL — ABNORMAL LOW (ref >39)
Total CHOL/HDL Ratio: 6.7 Ratio
Triglycerides: 460 mg/dL — ABNORMAL HIGH (ref <150)

## 2012-09-12 NOTE — Telephone Encounter (Signed)
Pt is calling back about his lab work  Best number is [phone]

## 2012-09-12 NOTE — Telephone Encounter (Signed)
Notes Recorded by Cheri Kearns, CMA on 09/12/2012 at 9:39 AM Left message on machine for pt to call back ------  Notes Recorded by Phillips Odor, MD on 09/12/2012 at 8:38 AM Lipids have improved. Follow up with your cardiologist   Called him to advise. Left another message/ see lab.

## 2012-09-17 ENCOUNTER — Other Ambulatory Visit: Payer: Self-pay | Admitting: Physician Assistant

## 2012-09-22 ENCOUNTER — Other Ambulatory Visit: Payer: Self-pay | Admitting: Physician Assistant

## 2012-09-22 NOTE — Telephone Encounter (Signed)
Patient calling back to lab results.   Best number: [phone]

## 2012-09-25 ENCOUNTER — Telehealth: Payer: Self-pay

## 2012-09-25 NOTE — Telephone Encounter (Signed)
Spoke with patient and did not see new message since we had called for lab results.

## 2012-09-25 NOTE — Telephone Encounter (Signed)
Please call patient back - lab call   [phone]

## 2012-10-01 ENCOUNTER — Other Ambulatory Visit: Payer: Self-pay | Admitting: Physician Assistant

## 2012-11-03 ENCOUNTER — Other Ambulatory Visit: Payer: Self-pay | Admitting: Physician Assistant

## 2012-11-08 ENCOUNTER — Telehealth: Payer: Self-pay

## 2012-11-08 NOTE — Telephone Encounter (Signed)
We sent in a 90 day supply on 8/4/ 14. What is he referring to? Who d/c his statin? Will call him tomorrow

## 2012-11-08 NOTE — Telephone Encounter (Signed)
PT STATES HE DID NOT GET HIS PROPER NO OF REFILLS THRU [facility],SAYS THIS CAUSED HIS BP TO ELEVATE. 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 HE NOW HAS NO MONEY TO COME IN FOR OFFICE VISITS AND ALSO STATES WE KEEP FILLING HIS STATIN RX WHEN HE TOLD us HE WOULD NOT TAKE THEM????   BEST PHONE FOR PT [phone]

## 2012-11-09 NOTE — Telephone Encounter (Signed)
Left message for him to call me back.  

## 2012-11-13 NOTE — Telephone Encounter (Signed)
Called again left another message

## 2012-12-03 ENCOUNTER — Other Ambulatory Visit: Payer: Self-pay | Admitting: Physician Assistant

## 2012-12-07 ENCOUNTER — Other Ambulatory Visit: Payer: Self-pay | Admitting: Cardiology

## 2012-12-16 ENCOUNTER — Other Ambulatory Visit: Payer: Self-pay | Admitting: Physician Assistant

## 2012-12-21 ENCOUNTER — Other Ambulatory Visit: Payer: Self-pay | Admitting: Physician Assistant

## 2012-12-21 NOTE — Telephone Encounter (Signed)
Needs OV, labs 

## 2013-01-11 ENCOUNTER — Other Ambulatory Visit: Payer: Self-pay | Admitting: Emergency Medicine

## 2013-02-10 ENCOUNTER — Other Ambulatory Visit: Payer: Self-pay | Admitting: Emergency Medicine

## 2013-03-01 ENCOUNTER — Other Ambulatory Visit: Payer: Self-pay | Admitting: Cardiology

## 2013-03-16 ENCOUNTER — Other Ambulatory Visit: Payer: Self-pay | Admitting: Physician Assistant

## 2013-03-21 ENCOUNTER — Other Ambulatory Visit: Payer: Self-pay | Admitting: Physician Assistant

## 2013-04-08 ENCOUNTER — Other Ambulatory Visit: Payer: Self-pay | Admitting: Cardiology

## 2013-04-10 ENCOUNTER — Other Ambulatory Visit: Payer: Self-pay | Admitting: Emergency Medicine

## 2013-04-28 ENCOUNTER — Ambulatory Visit: Payer: Managed Care, Other (non HMO) | Admitting: Physician Assistant

## 2013-04-28 VITALS — BP 142/74 | HR 61 | Temp 98.3°F | Resp 16 | Ht 71.0 in | Wt 192.0 lb

## 2013-04-28 DIAGNOSIS — I10 Essential (primary) hypertension: Secondary | ICD-10-CM

## 2013-04-28 DIAGNOSIS — E785 Hyperlipidemia, unspecified: Secondary | ICD-10-CM

## 2013-04-28 DIAGNOSIS — E119 Type 2 diabetes mellitus without complications: Secondary | ICD-10-CM

## 2013-04-28 DIAGNOSIS — Z1159 Encounter for screening for other viral diseases: Secondary | ICD-10-CM

## 2013-04-28 LAB — COMPREHENSIVE METABOLIC PANEL
ALBUMIN: 4.6 g/dL (ref 3.5–5.2)
ALT: 26 U/L (ref 0–53)
AST: 16 U/L (ref 0–37)
Alkaline Phosphatase: 66 U/L (ref 39–117)
BUN: 21 mg/dL (ref 6–23)
CALCIUM: 10.4 mg/dL (ref 8.4–10.5)
CHLORIDE: 103 meq/L (ref 96–112)
CO2: 27 mEq/L (ref 19–32)
Creat: 1.15 mg/dL (ref 0.50–1.35)
Glucose, Bld: 107 mg/dL — ABNORMAL HIGH (ref 70–99)
POTASSIUM: 4.6 meq/L (ref 3.5–5.3)
Sodium: 140 mEq/L (ref 135–145)
Total Bilirubin: 0.5 mg/dL (ref 0.2–1.2)
Total Protein: 7.9 g/dL (ref 6.0–8.3)

## 2013-04-28 LAB — LIPID PANEL
CHOLESTEROL: 235 mg/dL — AB (ref 0–200)
HDL: 38 mg/dL — AB (ref >39)
Total CHOL/HDL Ratio: 6.2 Ratio
Triglycerides: 652 mg/dL — ABNORMAL HIGH (ref <150)

## 2013-04-28 LAB — POCT CBC
Granulocyte percent: 61.7 %G (ref 37–80)
HEMATOCRIT: 45.2 % (ref 43.5–53.7)
Hemoglobin: 15.4 g/dL (ref 14.1–18.1)
LYMPH, POC: 3.7 — AB (ref 0.6–3.4)
MCH, POC: 30.8 pg (ref 27–31.2)
MCHC: 34.1 g/dL (ref 31.8–35.4)
MCV: 90.4 fL (ref 80–97)
MID (CBC): 0.7 (ref 0–0.9)
MPV: 10.4 fL (ref 0–99.8)
POC GRANULOCYTE: 7.2 — AB (ref 2–6.9)
POC LYMPH %: 31.9 % (ref 10–50)
POC MID %: 6.4 %M (ref 0–12)
Platelet Count, POC: 267 10*3/uL (ref 142–424)
RBC: 5 M/uL (ref 4.69–6.13)
RDW, POC: 13.5 %
WBC: 11.6 10*3/uL — AB (ref 4.6–10.2)

## 2013-04-28 LAB — MICROALBUMIN, URINE: MICROALB UR: 1.71 mg/dL (ref 0.00–1.89)

## 2013-04-28 LAB — HEPATITIS C ANTIBODY: HCV AB: NEGATIVE

## 2013-04-28 LAB — POCT GLYCOSYLATED HEMOGLOBIN (HGB A1C): Hemoglobin A1C: 5.8

## 2013-04-28 LAB — GLUCOSE, POCT (MANUAL RESULT ENTRY): POC Glucose: 90 mg/dL (ref 70–99)

## 2013-04-28 MED ORDER — METOPROLOL TARTRATE 50 MG PO TABS: ORAL_TABLET | ORAL | Status: DC

## 2013-04-28 MED ORDER — SPIRONOLACTONE 50 MG PO TABS: 50.0000 mg | ORAL_TABLET | Freq: Once | ORAL | Status: DC

## 2013-04-28 MED ORDER — HYDRALAZINE HCL 100 MG PO TABS: ORAL_TABLET | ORAL | Status: DC

## 2013-04-28 MED ORDER — METFORMIN HCL 500 MG PO TABS: 500.0000 mg | ORAL_TABLET | Freq: Two times a day (BID) | ORAL | Status: DC

## 2013-04-28 MED ORDER — LISINOPRIL 40 MG PO TABS: 40.0000 mg | ORAL_TABLET | Freq: Every day | ORAL | Status: DC

## 2013-04-28 NOTE — Patient Instructions (Addendum)
Please consider getting the following: Tetanus booster, flu vaccine, pneumococcal vaccine, colonoscopy.  I will contact you with your lab results as soon as they are available.   If you have not heard from me in 2 weeks, please contact me.  The fastest way to get your results is to register for My Chart (see the instructions on the last page of this printout).

## 2013-04-28 NOTE — Progress Notes (Signed)
Subjective:    Patient ID: Kyle [name], male    DOB: January 15, 1956, 58 y.o.   MRN: [id number]   PCP: Carmelina Dane, MD  Chief Complaint  Patient presents with  . Medication Refill    apresoline, prinivil, metformin, meoprolol, and aldactone    Medications, allergies, past medical history, surgical history, family history, social history and problem list reviewed and updated.   HPI  Has been out of Metformin for about a week. Home BP 120's/60's recently.  Frequency of home glucose monitoring: doesn't check ("I don't have to check it.") Sees eye specialist annually at [facility]. Doesn't routinely see a dentist, due to fully compensated complete edentula. Checks feet daily. Is not current with influenza vaccine. Declines today. Is not current with pneumococcal vaccine. Declines today. "I've never had the flu or pneumonia, and I've never had those shots, and I don't plan to start now." Due to Tdap, decline today. Not interested in referral for colonoscopy.  He is accompanied by his wife today.  Review of Systems Denies chest pain, shortness of breath, HA, dizziness, vision change, nausea, vomiting, diarrhea, constipation, melena, hematochezia, dysuria, increased urinary urgency or frequency, increased hunger or thirst, unintentional weight change, unexplained myalgias or arthralgias, rash.     Objective:   Physical Exam  Vitals reviewed. Constitutional: He is oriented to person, place, and time. Vital signs are normal. He appears well-developed and well-nourished. He is active and cooperative. No distress.  BP 142/74  Pulse 61  Temp(Src) 98.3 F (36.8 C)  Resp 16  Ht 5\' 11"  (1.803 m)  Wt 192 lb (87.091 kg)  BMI 26.79 kg/m2  SpO2 99%   HENT:  Head: Normocephalic and atraumatic.  Right Ear: Hearing normal.  Left Ear: Hearing normal.  Eyes: EOM are normal. Pupils are equal, round, and reactive to light.  Neck: Normal range of motion. Neck supple. No  thyromegaly present.  Cardiovascular: Normal rate, regular rhythm and normal heart sounds.   Pulses:      Radial pulses are 2+ on the right side, and 2+ on the left side.       Dorsalis pedis pulses are 2+ on the right side, and 2+ on the left side.       Posterior tibial pulses are 2+ on the right side, and 2+ on the left side.  Pulmonary/Chest: Effort normal and breath sounds normal.  Lymphadenopathy:       Head (right side): No tonsillar, no preauricular, no posterior auricular and no occipital adenopathy present.       Head (left side): No tonsillar, no preauricular, no posterior auricular and no occipital adenopathy present.    He has no cervical adenopathy.       Right: No supraclavicular adenopathy present.       Left: No supraclavicular adenopathy present.  Neurological: He is alert and oriented to person, place, and time. No sensory deficit.  Skin: Skin is warm, dry and intact. No rash noted. No cyanosis or erythema. Nails show no clubbing.  See diabetic foot exam.   Psychiatric: He has a normal mood and affect.    Results for orders placed in visit on 04/28/13  GLUCOSE, POCT (MANUAL RESULT ENTRY)      Result Value Ref Range   POC Glucose 90  70 - 99 mg/dl  POCT GLYCOSYLATED HEMOGLOBIN (HGB A1C)      Result Value Ref Range   Hemoglobin A1C 5.8    POCT CBC      Result Value Ref  Range   WBC 11.6 (*) 4.6 - 10.2 K/uL   Lymph, poc 3.7 (*) 0.6 - 3.4   POC LYMPH PERCENT 31.9  10 - 50 %L   MID (cbc) 0.7  0 - 0.9   POC MID % 6.4  0 - 12 %M   POC Granulocyte 7.2 (*) 2 - 6.9   Granulocyte percent 61.7  37 - 80 %G   RBC 5.00  4.69 - 6.13 M/uL   Hemoglobin 15.4  14.1 - 18.1 g/dL   HCT, POC 21.345.2  08.643.5 - 53.7 %   MCV 90.4  80 - 97 fL   MCH, POC 30.8  27 - 31.2 pg   MCHC 34.1  31.8 - 35.4 g/dL   RDW, POC 57.813.5     Platelet Count, POC 267  142 - 424 K/uL   MPV 10.4  0 - 99.8 fL         Assessment & Plan:  1. Type II or unspecified type diabetes mellitus without mention of  complication, not stated as uncontrolled Well-controlled. Continue current treatment. - HM Diabetes Eye Exam - HM Diabetes Foot Exam - POCT glucose (manual entry) - POCT glycosylated hemoglobin (Hb A1C) - Comprehensive metabolic panel - Microalbumin, urine - metFORMIN (GLUCOPHAGE) 500 MG tablet; Take 1 tablet (500 mg total) by mouth 2 (two) times daily with a meal.  Dispense: 180 tablet; Refill: 3  2. Hypertension Above goal here today, but controlled by report of home readings.  Advise keeping a log of home measurements to bring to visits here and with cardiology. - POCT CBC - hydrALAZINE (APRESOLINE) 100 MG tablet; TAKE 1 TABLET THREE TIMES A DAY  Dispense: 270 tablet; Refill: 3 - lisinopril (PRINIVIL,ZESTRIL) 40 MG tablet; Take 1 tablet (40 mg total) by mouth daily.  Dispense: 90 tablet; Refill: 3 - metoprolol (LOPRESSOR) 50 MG tablet; TAKE 1 TABLET TWICE A DAY  Dispense: 180 tablet; Refill: 3 - spironolactone (ALDACTONE) 50 MG tablet; Take 1 tablet (50 mg total) by mouth once.  Dispense: 90 tablet; Refill: 3  3. Hyperlipidemia Await lab results.  Intolerant to statins. - Lipid panel  4. Need for hepatitis C screening test - Hepatitis C antibody  Patient Instructions  Please consider getting the following: Tetanus booster, flu vaccine, pneumococcal vaccine, colonoscopy.    Return in about 6 months (around 10/29/2013). Unless labs indicate need for visit sooner.  Fernande Brashelle S. Khalila Buechner, PA-C Physician Assistant-Certified [facility]

## 2013-04-29 ENCOUNTER — Encounter: Payer: Self-pay | Admitting: Physician Assistant

## 2013-04-29 MED ORDER — GEMFIBROZIL 600 MG PO TABS: 600.0000 mg | ORAL_TABLET | Freq: Two times a day (BID) | ORAL | Status: DC

## 2013-04-29 NOTE — Addendum Note (Signed)
Addended by: Fernande BrasJEFFERY, Bowen Goyal S on: 04/29/2013 07:15 PM   Modules accepted: Orders

## 2013-07-08 ENCOUNTER — Other Ambulatory Visit: Payer: Self-pay | Admitting: Cardiology

## 2013-11-14 ENCOUNTER — Telehealth: Payer: Self-pay | Admitting: *Deleted

## 2013-11-14 NOTE — Telephone Encounter (Signed)
Called patient left message in voice mail of cell number to follow up diabetes at [address] [facility].

## 2013-12-13 ENCOUNTER — Other Ambulatory Visit: Payer: Self-pay | Admitting: Cardiology

## 2014-01-26 ENCOUNTER — Ambulatory Visit (INDEPENDENT_AMBULATORY_CARE_PROVIDER_SITE_OTHER): Payer: Managed Care, Other (non HMO) | Admitting: Physician Assistant

## 2014-01-26 VITALS — BP 116/60 | HR 71 | Temp 98.2°F | Resp 18 | Ht 72.0 in | Wt 195.0 lb

## 2014-01-26 DIAGNOSIS — I10 Essential (primary) hypertension: Secondary | ICD-10-CM

## 2014-01-26 DIAGNOSIS — M1610 Unilateral primary osteoarthritis, unspecified hip: Secondary | ICD-10-CM | POA: Insufficient documentation

## 2014-01-26 DIAGNOSIS — E785 Hyperlipidemia, unspecified: Secondary | ICD-10-CM

## 2014-01-26 DIAGNOSIS — G43109 Migraine with aura, not intractable, without status migrainosus: Secondary | ICD-10-CM | POA: Insufficient documentation

## 2014-01-26 DIAGNOSIS — E119 Type 2 diabetes mellitus without complications: Secondary | ICD-10-CM

## 2014-01-26 LAB — LIPID PANEL
CHOL/HDL RATIO: 8.2 ratio
Cholesterol: 270 mg/dL — ABNORMAL HIGH (ref 0–200)
HDL: 33 mg/dL — ABNORMAL LOW (ref >39)
Triglycerides: 852 mg/dL — ABNORMAL HIGH (ref <150)

## 2014-01-26 LAB — COMPREHENSIVE METABOLIC PANEL
ALT: 42 U/L (ref 0–53)
AST: 25 U/L (ref 0–37)
Albumin: 4.7 g/dL (ref 3.5–5.2)
Alkaline Phosphatase: 68 U/L (ref 39–117)
BILIRUBIN TOTAL: 0.5 mg/dL (ref 0.2–1.2)
BUN: 18 mg/dL (ref 6–23)
CALCIUM: 10.6 mg/dL — AB (ref 8.4–10.5)
CO2: 23 mEq/L (ref 19–32)
CREATININE: 1.1 mg/dL (ref 0.50–1.35)
Chloride: 102 mEq/L (ref 96–112)
Glucose, Bld: 135 mg/dL — ABNORMAL HIGH (ref 70–99)
Potassium: 4.6 mEq/L (ref 3.5–5.3)
SODIUM: 140 meq/L (ref 135–145)
Total Protein: 8.1 g/dL (ref 6.0–8.3)

## 2014-01-26 LAB — POCT CBC
GRANULOCYTE PERCENT: 61.7 % (ref 37–80)
HEMATOCRIT: 46.5 % (ref 43.5–53.7)
Hemoglobin: 15.8 g/dL (ref 14.1–18.1)
Lymph, poc: 4.1 — AB (ref 0.6–3.4)
MCH, POC: 30 pg (ref 27–31.2)
MCHC: 33.9 g/dL (ref 31.8–35.4)
MCV: 88.4 fL (ref 80–97)
MID (cbc): 0.3 (ref 0–0.9)
MPV: 8.7 fL (ref 0–99.8)
POC GRANULOCYTE: 7.1 — AB (ref 2–6.9)
POC LYMPH %: 35.8 % (ref 10–50)
POC MID %: 2.5 %M (ref 0–12)
Platelet Count, POC: 276 10*3/uL (ref 142–424)
RBC: 5.26 M/uL (ref 4.69–6.13)
RDW, POC: 12.9 %
WBC: 11.5 10*3/uL — AB (ref 4.6–10.2)

## 2014-01-26 LAB — POCT GLYCOSYLATED HEMOGLOBIN (HGB A1C): HEMOGLOBIN A1C: 6.8

## 2014-01-26 LAB — GLUCOSE, POCT (MANUAL RESULT ENTRY): POC Glucose: 102 mg/dl — AB (ref 70–99)

## 2014-01-26 MED ORDER — METOPROLOL TARTRATE 50 MG PO TABS: ORAL_TABLET | ORAL | Status: DC

## 2014-01-26 MED ORDER — SPIRONOLACTONE 50 MG PO TABS: 50.0000 mg | ORAL_TABLET | Freq: Once | ORAL | Status: DC

## 2014-01-26 MED ORDER — METFORMIN HCL 500 MG PO TABS: 500.0000 mg | ORAL_TABLET | Freq: Two times a day (BID) | ORAL | Status: DC

## 2014-01-26 MED ORDER — GEMFIBROZIL 600 MG PO TABS: 600.0000 mg | ORAL_TABLET | Freq: Two times a day (BID) | ORAL | Status: DC

## 2014-01-26 MED ORDER — LISINOPRIL 40 MG PO TABS: 40.0000 mg | ORAL_TABLET | Freq: Every day | ORAL | Status: DC

## 2014-01-26 MED ORDER — HYDRALAZINE HCL 100 MG PO TABS: ORAL_TABLET | ORAL | Status: DC

## 2014-01-26 NOTE — Patient Instructions (Signed)
I will contact you with your lab results as soon as they are available.   If you have not heard from me in 2 weeks, please contact me.  The fastest way to get your results is to register for My Chart (see the instructions on the last page of this printout).   

## 2014-01-26 NOTE — Progress Notes (Signed)
Subjective:    Patient ID: Kyle [name], male    DOB: 1955/07/16, 58 y.o.   MRN: [id number]   PCP: Carmelina DaneAnderson, Aadvika Konen S, MD  Chief Complaint  Patient presents with  . rx refills    lisinopril, hydralazine, metformin, metoprolol, spironolactone    Allergies  Allergen Reactions  . Statins     Leg cramps    Patient Active Problem List   Diagnosis Date Noted  . PAD (peripheral artery disease) 04/04/2012  . Cardiovascular risk factor 04/04/2012  . Hypertension 03/17/2012  . Diabetes 03/17/2012  . Hyperlipidemia 03/17/2012    Prior to Admission medications   Medication Sig Start Date End Date Taking? Authorizing Provider  aspirin 81 MG tablet Take 81 mg by mouth daily.   Yes Historical Provider, MD  fish oil-omega-3 fatty acids 1000 MG capsule Take 2 g by mouth daily.   Yes Historical Provider, MD  hydrALAZINE (APRESOLINE) 100 MG tablet TAKE 1 TABLET THREE TIMES A DAY 04/28/13  Yes Braidyn Peace S Aleaha Fickling, PA-C  lisinopril (PRINIVIL,ZESTRIL) 40 MG tablet Take 1 tablet (40 mg total) by mouth daily. 04/28/13  Yes Eriel Dunckel S Lain Tetterton, PA-C  metFORMIN (GLUCOPHAGE) 500 MG tablet Take 1 tablet (500 mg total) by mouth 2 (two) times daily with a meal. 04/28/13  Yes Mayleen Borrero S Ronnell Makarewicz, PA-C  metoprolol (LOPRESSOR) 50 MG tablet TAKE 1 TABLET TWICE A DAY 04/28/13  Yes Lee-Ann Gal S Beatriz Quintela, PA-C  spironolactone (ALDACTONE) 50 MG tablet Take 1 tablet (50 mg total) by mouth once. 04/28/13  Yes Kayvon Mo S Larine Fielding, PA-C  Coenzyme Q10 200 MG TABS Take by mouth. 04/04/12   Laurey Moralealton S McLean, MD  fenofibrate (TRICOR) 145 MG tablet TAKE 1 TABLET DAILY Patient not taking: Reported on 01/26/2014   Side effect Laurey Moralealton S McLean, MD  gemfibrozil (LOPID) 600 MG tablet Take 1 tablet (600 mg total) by mouth 2 (two) times daily before a meal. Patient not taking: Reported on 01/26/2014 04/29/13  Did not fill Noelie Renfrow S Sedric Guia, PA-C  pravastatin (PRAVACHOL) 20 MG tablet TAKE 1 TABLET EVERY EVENING Patient not taking: Reported on  01/26/2014 12/14/13  Side  effect Laurey Moralealton S McLean, MD    Medical, Surgical, Family and Social History reviewed and updated.  HPI  Presents for follow-up of diabetes, HTN and elevated lipids.  Last seen in 04/2013.  He doesn't like to follow-up as frequently as I recommend, we agreed on Q6 months as a compromise.   Frequency of home glucose monitoring: doesn't check ("I don't have to check it.") Sees eye specialist annually at [facility]. Doesn't routinely see a dentist, due to fully compensated complete edentula. Checks feet daily. Is not current with influenza vaccine. Declines today. Is not current with pneumococcal vaccine. Declines today. "I believe they cause a lot of problems." Due to Tdap, declines today. He does not have any vacation time to take until after the first of the year, and intends to schedule a screening colonoscopy after that.  Review of Systems Denies chest pain, shortness of breath, HA, dizziness, vision change, nausea, vomiting, diarrhea, constipation, melena, hematochezia, dysuria, increased urinary urgency or frequency, increased hunger or thirst, unintentional weight change, unexplained myalgias or arthralgias, rash.  He does have LEFT hip arthritis pain intermittently.    Objective:   Physical Exam  Constitutional: He is oriented to person, place, and time. Vital signs are normal. He appears well-developed and well-nourished. He is active and cooperative. No distress.  BP 116/60 mmHg  Pulse 71  Temp(Src) 98.2 F (36.8 C) (  Oral)  Resp 18  Ht 6' (1.829 m)  Wt 195 lb (88.451 kg)  BMI 26.44 kg/m2  SpO2 96%  HENT:  Head: Normocephalic and atraumatic.  Right Ear: Hearing normal.  Left Ear: Hearing normal.  Mouth/Throat: Oropharynx is clear and moist and mucous membranes are normal. He has dentures (fully compensated complete edentula).  Eyes: Conjunctivae are normal. No scleral icterus.  Neck: Normal range of motion. Neck supple. No thyromegaly present.   Cardiovascular: Normal rate, regular rhythm and normal heart sounds.   Pulses:      Radial pulses are 2+ on the right side, and 2+ on the left side.  Pulmonary/Chest: Effort normal and breath sounds normal.  Lymphadenopathy:       Head (right side): No tonsillar, no preauricular, no posterior auricular and no occipital adenopathy present.       Head (left side): No tonsillar, no preauricular, no posterior auricular and no occipital adenopathy present.    He has no cervical adenopathy.       Right: No supraclavicular adenopathy present.       Left: No supraclavicular adenopathy present.  Neurological: He is alert and oriented to person, place, and time. No sensory deficit.  Skin: Skin is warm, dry and intact. No rash noted. No cyanosis or erythema. Nails show no clubbing.  Psychiatric: He has a normal mood and affect. His speech is normal and behavior is normal.  Vitals reviewed.  Diabetic Foot Exam - Simple   Simple Foot Form  Diabetic Foot exam was performed with the following findings:  Yes 01/26/2014  1:11 PM  Visual Inspection  No deformities, no ulcerations, no other skin breakdown bilaterally:  Yes  Sensation Testing  Intact to touch and monofilament testing bilaterally:  Yes  Pulse Check  Posterior Tibialis and Dorsalis pulse intact bilaterally:  Yes  Comments         Results for orders placed or performed in visit on 01/26/14  POCT CBC  Result Value Ref Range   WBC 11.5 (A) 4.6 - 10.2 K/uL   Lymph, poc 4.1 (A) 0.6 - 3.4   POC LYMPH PERCENT 35.8 10 - 50 %L   MID (cbc) 0.3 0 - 0.9   POC MID % 2.5 0 - 12 %M   POC Granulocyte 7.1 (A) 2 - 6.9   Granulocyte percent 61.7 37 - 80 %G   RBC 5.26 4.69 - 6.13 M/uL   Hemoglobin 15.8 14.1 - 18.1 g/dL   HCT, POC 16.1 09.6 - 53.7 %   MCV 88.4 80 - 97 fL   MCH, POC 30.0 27 - 31.2 pg   MCHC 33.9 31.8 - 35.4 g/dL   RDW, POC 04.5 %   Platelet Count, POC 276 142 - 424 K/uL   MPV 8.7 0 - 99.8 fL  POCT glycosylated hemoglobin (Hb  A1C)  Result Value Ref Range   Hemoglobin A1C 6.8   POCT glucose (manual entry)  Result Value Ref Range   POC Glucose 102 (A) 70 - 99 mg/dl       Assessment & Plan:  1. Diabetes type 2, controlled A1C, while still <7%, has risen 1 point since 04/2013. Reminded of the importance of taking his medications regularly and getting exercise and making healthy eating choices. - POCT glycosylated hemoglobin (Hb A1C) - POCT glucose (manual entry) - Comprehensive metabolic panel - metFORMIN (GLUCOPHAGE) 500 MG tablet; Take 1 tablet (500 mg total) by mouth 2 (two) times daily with a meal.  Dispense: 180 tablet;  Refill: 3  2. Hyperlipidemia Never filled the Lopid prescribed in 04/2013. Start it now. - Lipid panel - gemfibrozil (LOPID) 600 MG tablet; Take 1 tablet (600 mg total) by mouth 2 (two) times daily before a meal.  Dispense: 180 tablet; Refill: 3  3. Essential hypertension Controlled. Continue current medications. - POCT CBC - spironolactone (ALDACTONE) 50 MG tablet; Take 1 tablet (50 mg total) by mouth once.  Dispense: 90 tablet; Refill: 3 - metoprolol (LOPRESSOR) 50 MG tablet; TAKE 1 TABLET TWICE A DAY  Dispense: 180 tablet; Refill: 3 - lisinopril (PRINIVIL,ZESTRIL) 40 MG tablet; Take 1 tablet (40 mg total) by mouth daily.  Dispense: 90 tablet; Refill: 3 - hydrALAZINE (APRESOLINE) 100 MG tablet; TAKE 1 TABLET THREE TIMES A DAY  Dispense: 270 tablet; Refill: 3  Return in about 6 months (around 07/28/2014) for re-evaluation and labs.  Fernande Brashelle S. Mahamed Zalewski, PA-C Physician Assistant-Certified [facility]

## 2014-02-01 ENCOUNTER — Other Ambulatory Visit: Payer: Self-pay

## 2014-02-01 DIAGNOSIS — I10 Essential (primary) hypertension: Secondary | ICD-10-CM

## 2014-02-01 NOTE — Telephone Encounter (Signed)
Chelle, [facility] faxed for clarification of sig bc you had clicked on "once" instead of "daily". I corrected sig, but when I went to sign I got a "very high" alert of interaction between lisinopril and spirolactone involving possible hyperkalemia. I know that pt has been taking these two meds together, but wanted to make sure you were aware.

## 2014-02-04 MED ORDER — SPIRONOLACTONE 50 MG PO TABS: 50.0000 mg | ORAL_TABLET | Freq: Every day | ORAL | Status: DC

## 2014-02-04 NOTE — Telephone Encounter (Signed)
Meds ordered this encounter  Medications  . spironolactone (ALDACTONE) 50 MG tablet    Sig: Take 1 tablet (50 mg total) by mouth daily.    Dispense:  90 tablet    Refill:  3

## 2014-09-21 ENCOUNTER — Ambulatory Visit (INDEPENDENT_AMBULATORY_CARE_PROVIDER_SITE_OTHER): Payer: Managed Care, Other (non HMO) | Admitting: Emergency Medicine

## 2014-09-21 VITALS — BP 130/80 | HR 65 | Temp 97.7°F | Ht 71.0 in | Wt 190.1 lb

## 2014-09-21 DIAGNOSIS — E785 Hyperlipidemia, unspecified: Secondary | ICD-10-CM | POA: Diagnosis not present

## 2014-09-21 DIAGNOSIS — E119 Type 2 diabetes mellitus without complications: Secondary | ICD-10-CM | POA: Diagnosis not present

## 2014-09-21 DIAGNOSIS — I10 Essential (primary) hypertension: Secondary | ICD-10-CM

## 2014-09-21 DIAGNOSIS — Z1211 Encounter for screening for malignant neoplasm of colon: Secondary | ICD-10-CM | POA: Diagnosis not present

## 2014-09-21 DIAGNOSIS — Z125 Encounter for screening for malignant neoplasm of prostate: Secondary | ICD-10-CM

## 2014-09-21 LAB — POCT URINALYSIS DIPSTICK
BILIRUBIN UA: NEGATIVE
Glucose, UA: 100
Ketones, UA: NEGATIVE
LEUKOCYTES UA: NEGATIVE
Nitrite, UA: NEGATIVE
PH UA: 5.5
PROTEIN UA: NEGATIVE
RBC UA: NEGATIVE
Spec Grav, UA: 1.025
Urobilinogen, UA: 0.2

## 2014-09-21 LAB — COMPREHENSIVE METABOLIC PANEL
ALT: 36 U/L (ref 9–46)
AST: 25 U/L (ref 10–35)
Albumin: 5 g/dL (ref 3.6–5.1)
Alkaline Phosphatase: 68 U/L (ref 40–115)
BUN: 27 mg/dL — ABNORMAL HIGH (ref 7–25)
CO2: 22 mmol/L (ref 20–31)
Calcium: 10.5 mg/dL — ABNORMAL HIGH (ref 8.6–10.3)
Chloride: 102 mmol/L (ref 98–110)
Creat: 0.99 mg/dL (ref 0.70–1.25)
Glucose, Bld: 199 mg/dL — ABNORMAL HIGH (ref 65–99)
Potassium: 4.6 mmol/L (ref 3.5–5.3)
Sodium: 137 mmol/L (ref 135–146)
Total Bilirubin: 0.6 mg/dL (ref 0.2–1.2)
Total Protein: 7.8 g/dL (ref 6.1–8.1)

## 2014-09-21 LAB — POCT UA - MICROSCOPIC ONLY
Casts, Ur, LPF, POC: NEGATIVE
Crystals, Ur, HPF, POC: NEGATIVE
MUCUS UA: NEGATIVE
RBC, urine, microscopic: NEGATIVE
Yeast, UA: NEGATIVE

## 2014-09-21 LAB — MICROALBUMIN, URINE: Microalb, Ur: 0.9 mg/dL (ref <2.0)

## 2014-09-21 LAB — LIPID PANEL
Cholesterol: 275 mg/dL — ABNORMAL HIGH (ref 125–200)
HDL: 31 mg/dL — ABNORMAL LOW (ref >=40)
Total CHOL/HDL Ratio: 8.9 Ratio — ABNORMAL HIGH (ref <=5.0)
Triglycerides: 527 mg/dL — ABNORMAL HIGH (ref <150)

## 2014-09-21 LAB — GLUCOSE, POCT (MANUAL RESULT ENTRY): POC Glucose: 162 mg/dl — AB (ref 70–99)

## 2014-09-21 LAB — POCT GLYCOSYLATED HEMOGLOBIN (HGB A1C): HEMOGLOBIN A1C: 7.8

## 2014-09-21 MED ORDER — LISINOPRIL 40 MG PO TABS: 40.0000 mg | ORAL_TABLET | Freq: Every day | ORAL | Status: DC

## 2014-09-21 MED ORDER — GEMFIBROZIL 600 MG PO TABS: 600.0000 mg | ORAL_TABLET | Freq: Two times a day (BID) | ORAL | Status: DC

## 2014-09-21 MED ORDER — METOPROLOL TARTRATE 50 MG PO TABS: ORAL_TABLET | ORAL | Status: DC

## 2014-09-21 MED ORDER — METFORMIN HCL 500 MG PO TABS: 1000.0000 mg | ORAL_TABLET | Freq: Two times a day (BID) | ORAL | Status: DC

## 2014-09-21 MED ORDER — HYDRALAZINE HCL 100 MG PO TABS: ORAL_TABLET | ORAL | Status: DC

## 2014-09-21 MED ORDER — SPIRONOLACTONE 50 MG PO TABS: 50.0000 mg | ORAL_TABLET | Freq: Every day | ORAL | Status: DC

## 2014-09-21 NOTE — Patient Instructions (Signed)

## 2014-09-21 NOTE — Progress Notes (Signed)
Subjective:  Patient ID: Kyle [name], male    DOB: 14-Oct-1955  Age: 59 y.o. MRN: [id number]  CC: Medication Refill and Mass   HPI Kyle [name] presents  patient comes for evaluation of his diabetes and hypertension. His blood pressures well-controlled sugar control is satisfactory although he did the clients to admit that he has diabetes says is "borderline" he's not had a colonoscopy and is 11 years overdue. Is a nonsmoker and denies any other complaints.  History Kyle [name] has a past medical history of History of basal cell carcinoma of skin; Prediabetes; Hypertension; and HLD (hyperlipidemia).   He has past surgical history that includes Spine surgery (1994) and Tonsillectomy (1961).   His  family history includes Diabetes in his father and sister; Hypertension in his mother, sister, and sister.  He   reports that he has quit smoking. His smoking use included Cigarettes. He started smoking about 6 years ago. He has never used smokeless tobacco. He reports that he does not drink alcohol or use illicit drugs.  Outpatient Prescriptions Prior to Visit  Medication Sig Dispense Refill  . aspirin 81 MG tablet Take 81 mg by mouth daily.    . fish oil-omega-3 fatty acids 1000 MG capsule Take 2 g by mouth daily.    Marland Kitchen gemfibrozil (LOPID) 600 MG tablet Take 1 tablet (600 mg total) by mouth 2 (two) times daily before a meal. 180 tablet 3  . hydrALAZINE (APRESOLINE) 100 MG tablet TAKE 1 TABLET THREE TIMES A DAY 270 tablet 3  . lisinopril (PRINIVIL,ZESTRIL) 40 MG tablet Take 1 tablet (40 mg total) by mouth daily. 90 tablet 3  . metFORMIN (GLUCOPHAGE) 500 MG tablet Take 1 tablet (500 mg total) by mouth 2 (two) times daily with a meal. 180 tablet 3  . metoprolol (LOPRESSOR) 50 MG tablet TAKE 1 TABLET TWICE A DAY 180 tablet 3  . spironolactone (ALDACTONE) 50 MG tablet Take 1 tablet (50 mg total) by mouth daily. 90 tablet 3  . Coenzyme Q10 200 MG TABS Take by mouth.     No  facility-administered medications prior to visit.    History   Social History  . Marital Status: Married    Spouse Name: Dennie Bible  . Number of Children: 2  . Years of Education: GED   Occupational History  . Estate agent     former truck Hospital doctor   Social History Main Topics  . Smoking status: Former Smoker    Types: Cigarettes    Start date: 11/05/2007  . Smokeless tobacco: Never Used  . Alcohol Use: No  . Drug Use: No  . Sexual Activity:    Partners: Female    Copy: None   Other Topics Concern  . None   Social History Narrative   Lives with his wife and their pets.     Review of Systems  Constitutional: Negative for fever, chills and appetite change.  HENT: Negative for congestion, ear pain, postnasal drip, sinus pressure and sore throat.   Eyes: Negative for pain and redness.  Respiratory: Negative for cough, shortness of breath and wheezing.   Cardiovascular: Negative for leg swelling.  Gastrointestinal: Negative for nausea, vomiting, abdominal pain, diarrhea, constipation and blood in stool.  Endocrine: Negative for polyuria.  Genitourinary: Negative for dysuria, urgency, frequency and flank pain.  Musculoskeletal: Negative for gait problem.  Skin: Negative for rash.  Neurological: Negative for weakness and headaches.  Psychiatric/Behavioral: Negative for confusion and decreased concentration. The patient is not nervous/anxious.  Objective:  BP 130/80 mmHg  Pulse 65  Temp(Src) 97.7 F (36.5 C) (Oral)  Ht 5\' 11"  (1.803 m)  Wt 190 lb 2 oz (86.24 kg)  BMI 26.53 kg/m2  SpO2 98%  Physical Exam  Constitutional: He is oriented to person, place, and time. He appears well-developed and well-nourished. No distress.  HENT:  Head: Normocephalic and atraumatic.  Right Ear: External ear normal.  Left Ear: External ear normal.  Nose: Nose normal.  Eyes: Conjunctivae and EOM are normal. Pupils are equal, round, and reactive to light. No  scleral icterus.  Neck: Normal range of motion. Neck supple. No tracheal deviation present.  Cardiovascular: Normal rate, regular rhythm and normal heart sounds.   Pulmonary/Chest: Effort normal. No respiratory distress. He has no wheezes. He has no rales.  Abdominal: He exhibits no mass. There is no tenderness. There is no rebound and no guarding.  Musculoskeletal: He exhibits no edema.  Lymphadenopathy:    He has no cervical adenopathy.  Neurological: He is alert and oriented to person, place, and time. Coordination normal.  Skin: Skin is warm and dry. No rash noted.  Psychiatric: He has a normal mood and affect. His behavior is normal.      Assessment & Plan:   Kyle [name] was seen today for medication refill and mass.  Diagnoses and all orders for this visit:  Essential hypertension Orders: -     spironolactone (ALDACTONE) 50 MG tablet; Take 1 tablet (50 mg total) by mouth daily. -     lisinopril (PRINIVIL,ZESTRIL) 40 MG tablet; Take 1 tablet (40 mg total) by mouth daily. -     hydrALAZINE (APRESOLINE) 100 MG tablet; TAKE 1 TABLET THREE TIMES A DAY -     metoprolol (LOPRESSOR) 50 MG tablet; TAKE 1 TABLET TWICE A DAY -     POCT glucose (manual entry) -     POCT glycosylated hemoglobin (Hb A1C) -     Comprehensive metabolic panel -     Lipid panel -     POCT UA - Microscopic Only -     POCT urinalysis dipstick -     Microalbumin, urine -     Ambulatory referral to Ophthalmology  Hyperlipidemia Orders: -     gemfibrozil (LOPID) 600 MG tablet; Take 1 tablet (600 mg total) by mouth 2 (two) times daily before a meal. -     POCT glucose (manual entry) -     POCT glycosylated hemoglobin (Hb A1C) -     Comprehensive metabolic panel -     Lipid panel -     POCT UA - Microscopic Only -     POCT urinalysis dipstick -     Microalbumin, urine -     Ambulatory referral to Ophthalmology  Diabetes type 2, controlled Orders: -     metFORMIN (GLUCOPHAGE) 500 MG tablet; Take 2 tablets  (1,000 mg total) by mouth 2 (two) times daily with a meal. -     POCT glucose (manual entry) -     POCT glycosylated hemoglobin (Hb A1C) -     Comprehensive metabolic panel -     Lipid panel -     POCT UA - Microscopic Only -     POCT urinalysis dipstick -     Microalbumin, urine -     Ambulatory referral to Ophthalmology  Special screening for malignant neoplasms, colon Orders: -     Ambulatory referral to Gastroenterology   I have changed Kyle [name] metFORMIN. I am also  having him maintain his aspirin, fish oil-omega-3 fatty acids, Coenzyme Q10, spironolactone, lisinopril, gemfibrozil, hydrALAZINE, and metoprolol.  Meds ordered this encounter  Medications  . spironolactone (ALDACTONE) 50 MG tablet    Sig: Take 1 tablet (50 mg total) by mouth daily.    Dispense:  90 tablet    Refill:  3  . lisinopril (PRINIVIL,ZESTRIL) 40 MG tablet    Sig: Take 1 tablet (40 mg total) by mouth daily.    Dispense:  90 tablet    Refill:  3  . gemfibrozil (LOPID) 600 MG tablet    Sig: Take 1 tablet (600 mg total) by mouth 2 (two) times daily before a meal.    Dispense:  180 tablet    Refill:  3  . hydrALAZINE (APRESOLINE) 100 MG tablet    Sig: TAKE 1 TABLET THREE TIMES A DAY    Dispense:  270 tablet    Refill:  3  . metFORMIN (GLUCOPHAGE) 500 MG tablet    Sig: Take 2 tablets (1,000 mg total) by mouth 2 (two) times daily with a meal.    Dispense:  360 tablet    Refill:  3  . metoprolol (LOPRESSOR) 50 MG tablet    Sig: TAKE 1 TABLET TWICE A DAY    Dispense:  180 tablet    Refill:  3   I increased his dose of metformin from 500 twice daily 1000 twice daily. I also encouraged him to go for consultation with GI specialist for colonoscopy as he is 11 years overdue and he is refused stating concern about missing work.  Appropriate red flag conditions were discussed with the patient as well as actions that should be taken.  Patient expressed his understanding.  Follow-up: Return in about 6  months (around 03/24/2015), or if symptoms worsen or fail to improve.  Carmelina Dane, MD   Results for orders placed or performed in visit on 09/21/14  POCT glucose (manual entry)  Result Value Ref Range   POC Glucose 162 (A) 70 - 99 mg/dl  POCT glycosylated hemoglobin (Hb A1C)  Result Value Ref Range   Hemoglobin A1C 7.8   POCT UA - Microscopic Only  Result Value Ref Range   WBC, Ur, HPF, POC 0-1    RBC, urine, microscopic Negative    Bacteria, U Microscopic trace    Mucus, UA negative    Epithelial cells, urine per micros 0-3    Crystals, Ur, HPF, POC Negative    Casts, Ur, LPF, POC Negative    Yeast, UA Negative   POCT urinalysis dipstick  Result Value Ref Range   Color, UA Amber    Clarity, UA Clear    Glucose, UA 100    Bilirubin, UA Negative    Ketones, UA Negative    Spec Grav, UA 1.025    Blood, UA Negative    pH, UA 5.5    Protein, UA negative    Urobilinogen, UA 0.2    Nitrite, UA negative    Leukocytes, UA Negative Negative

## 2014-09-24 LAB — PSA: PSA: 0.57 ng/mL (ref <=4.00)

## 2014-09-26 ENCOUNTER — Encounter: Payer: Self-pay | Admitting: Family Medicine

## 2014-11-18 LAB — HM DIABETES EYE EXAM

## 2015-08-26 ENCOUNTER — Ambulatory Visit (INDEPENDENT_AMBULATORY_CARE_PROVIDER_SITE_OTHER): Payer: Managed Care, Other (non HMO) | Admitting: Physician Assistant

## 2015-08-26 ENCOUNTER — Encounter: Payer: Self-pay | Admitting: Physician Assistant

## 2015-08-26 VITALS — BP 190/82 | HR 68 | Temp 97.3°F | Resp 16 | Ht 70.0 in | Wt 184.0 lb

## 2015-08-26 DIAGNOSIS — E785 Hyperlipidemia, unspecified: Secondary | ICD-10-CM

## 2015-08-26 DIAGNOSIS — I10 Essential (primary) hypertension: Secondary | ICD-10-CM

## 2015-08-26 DIAGNOSIS — Z23 Encounter for immunization: Secondary | ICD-10-CM

## 2015-08-26 DIAGNOSIS — E119 Type 2 diabetes mellitus without complications: Secondary | ICD-10-CM | POA: Diagnosis not present

## 2015-08-26 DIAGNOSIS — Z114 Encounter for screening for human immunodeficiency virus [HIV]: Secondary | ICD-10-CM | POA: Diagnosis not present

## 2015-08-26 DIAGNOSIS — I739 Peripheral vascular disease, unspecified: Secondary | ICD-10-CM

## 2015-08-26 MED ORDER — METOPROLOL TARTRATE 50 MG PO TABS: ORAL_TABLET | ORAL | Status: DC

## 2015-08-26 MED ORDER — METFORMIN HCL 500 MG PO TABS: 1000.0000 mg | ORAL_TABLET | Freq: Two times a day (BID) | ORAL | Status: DC

## 2015-08-26 MED ORDER — GEMFIBROZIL 600 MG PO TABS: 600.0000 mg | ORAL_TABLET | Freq: Two times a day (BID) | ORAL | Status: DC

## 2015-08-26 MED ORDER — HYDRALAZINE HCL 100 MG PO TABS: ORAL_TABLET | ORAL | Status: DC

## 2015-08-26 MED ORDER — SPIRONOLACTONE 50 MG PO TABS: 50.0000 mg | ORAL_TABLET | Freq: Every day | ORAL | Status: DC

## 2015-08-26 MED ORDER — LISINOPRIL 40 MG PO TABS: 40.0000 mg | ORAL_TABLET | Freq: Every day | ORAL | Status: DC

## 2015-08-26 NOTE — Progress Notes (Signed)
Patient ID: Kyle DuffelJames [name], male    DOB: 08-27-1955, 60 y.o.   MRN: [id number]  PCP: Carmelina DaneJeffery S Anderson, MD  Subjective:   Chief Complaint  Patient presents with  . Medication Refill    all meds    HPI Presents for medication refills. Last seen here 09/2014.  He is due for evaluation of DM, HTN.  He feels well, denies problems/concerns/medication side effects. Home BP 140/80. Normally takes BP med at noon, and his dose is in the car.  Overdue health maintenance: Declines shingles, pneumococcal, colonoscopy.   Review of Systems No chest pain, SOB, HA, dizziness, vision change, N/V, diarrhea, constipation, dysuria, urinary urgency or frequency, myalgias, arthralgias or rash.     Patient Active Problem List   Diagnosis Date Noted  . 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 Arthritis, hip 01/26/2014  . PAD (peripheral artery disease) (HCC) 04/04/2012  . Cardiovascular risk factor 04/04/2012  . Hypertension 03/17/2012  . Diabetes (HCC) 03/17/2012  . Hyperlipidemia 03/17/2012     Prior to Admission medications   Medication Sig Start Date End Date Taking? Authorizing Provider  aspirin 81 MG tablet Take 81 mg by mouth daily.   Yes Historical Provider, MD  gemfibrozil (LOPID) 600 MG tablet Take 1 tablet (600 mg total) by mouth 2 (two) times daily before a meal. 09/21/14  Yes Carmelina DaneJeffery S Anderson, MD  hydrALAZINE (APRESOLINE) 100 MG tablet TAKE 1 TABLET THREE TIMES A DAY 09/21/14  Yes Carmelina DaneJeffery S Anderson, MD  lisinopril (PRINIVIL,ZESTRIL) 40 MG tablet Take 1 tablet (40 mg total) by mouth daily. 09/21/14  Yes Carmelina DaneJeffery S Anderson, MD  metFORMIN (GLUCOPHAGE) 500 MG tablet Take 2 tablets (1,000 mg total) by mouth 2 (two) times daily with a meal. 09/21/14  Yes Carmelina DaneJeffery S Anderson, MD  metoprolol (LOPRESSOR) 50 MG tablet TAKE 1 TABLET TWICE A DAY 09/21/14  Yes Carmelina DaneJeffery S Anderson, MD  spironolactone (ALDACTONE) 50 MG tablet Take 1 tablet (50 mg total) by mouth daily. 09/21/14  Yes Carmelina DaneJeffery S Anderson, MD    Coenzyme Q10 200 MG TABS Take by mouth. Reported on 08/26/2015 04/04/12   Laurey Moralealton S McLean, MD  fish oil-omega-3 fatty acids 1000 MG capsule Take 2 g by mouth daily. Reported on 08/26/2015    Historical Provider, MD     Allergies  Allergen Reactions  . Statins     Leg cramps       Objective:  Physical Exam  Constitutional: He is oriented to person, place, and time. He appears well-developed and well-nourished. He is active and cooperative. No distress.  BP 190/82 mmHg  Pulse 68  Temp(Src) 97.3 F (36.3 C) (Oral)  Resp 16  Ht 5\' 10"  (1.778 m)  Wt 184 lb (83.462 kg)  BMI 26.40 kg/m2  SpO2 98%  HENT:  Head: Normocephalic and atraumatic.  Right Ear: Hearing normal.  Left Ear: Hearing normal.  Eyes: Conjunctivae are normal. No scleral icterus.  Neck: Normal range of motion. Neck supple. No thyromegaly present.  Cardiovascular: Normal rate, regular rhythm and normal heart sounds.   Pulses:      Radial pulses are 2+ on the right side, and 2+ on the left side.  Pulmonary/Chest: Effort normal and breath sounds normal.  Lymphadenopathy:       Head (right side): No tonsillar, no preauricular, no posterior auricular and no occipital adenopathy present.       Head (left side): No tonsillar, no preauricular, no posterior auricular and no occipital adenopathy present.    He has no  cervical adenopathy.       Right: No supraclavicular adenopathy present.       Left: No supraclavicular adenopathy present.  Neurological: He is alert and oriented to person, place, and time. No sensory deficit.  Skin: Skin is warm, dry and intact. No rash noted. No cyanosis or erythema. Nails show no clubbing.  Psychiatric: He has a normal mood and affect. His speech is normal and behavior is normal.           Assessment & Plan:   1. Controlled type 2 diabetes mellitus without complication, without long-term current use of insulin (HCC) - metFORMIN (GLUCOPHAGE) 500 MG tablet; Take 2 tablets (1,000 mg  total) by mouth 2 (two) times daily with a meal.  Dispense: 360 tablet; Refill: 3  2. Essential hypertension - spironolactone (ALDACTONE) 50 MG tablet; Take 1 tablet (50 mg total) by mouth daily.  Dispense: 90 tablet; Refill: 3 - metoprolol (LOPRESSOR) 50 MG tablet; TAKE 1 TABLET TWICE A DAY  Dispense: 180 tablet; Refill: 3 - lisinopril (PRINIVIL,ZESTRIL) 40 MG tablet; Take 1 tablet (40 mg total) by mouth daily.  Dispense: 90 tablet; Refill: 3 - hydrALAZINE (APRESOLINE) 100 MG tablet; TAKE 1 TABLET THREE TIMES A DAY  Dispense: 270 tablet; Refill: 3  3. Hyperlipidemia - gemfibrozil (LOPID) 600 MG tablet; Take 1 tablet (600 mg total) by mouth 2 (two) times daily before a meal.  Dispense: 180 tablet; Refill: 3  4. PAD (peripheral artery disease) (HCC)  5. Need for Tdap vaccination - Tdap vaccine greater than or equal to 7yo IM - Care order/instruction  6. Screening for HIV (human immunodeficiency virus) - HIV antibody  Return in about 4 months (around 12/27/2015) for re-evaluation of diabetes and blood pressure. It is not clear to me why the patient had only the HIV test, and none of the other intended labs: A1C, CBC, CMET, Lipids, Urine Microalbumin. Will need these at his follow-up visit in 3 months.   Fernande Bras, PA-C Physician Assistant-Certified [facility]

## 2015-08-26 NOTE — Patient Instructions (Signed)
     IF you received an x-ray today, you will receive an invoice from [facility]. Please contact [facility] at [phone] with questions or concerns regarding your invoice.   IF you received labwork today, you will receive an invoice from [facility]. Please contact [facility] at [phone] with questions or concerns regarding your invoice.   Our billing staff will not be able to assist you with questions regarding bills from these companies.  You will be contacted with the lab results as soon as they are available. The fastest way to get your results is to activate your My Chart account. Instructions are located on the last page of this paperwork. If you have not heard from us regarding the results in 2 weeks, please contact this office.      

## 2015-08-27 LAB — HIV ANTIBODY (ROUTINE TESTING W REFLEX): HIV 1&2 Ab, 4th Generation: NONREACTIVE

## 2015-10-30 ENCOUNTER — Other Ambulatory Visit: Payer: Self-pay

## 2015-10-30 DIAGNOSIS — I10 Essential (primary) hypertension: Secondary | ICD-10-CM

## 2015-10-30 MED ORDER — HYDRALAZINE HCL 100 MG PO TABS: ORAL_TABLET | ORAL | 2 refills | Status: DC

## 2015-10-30 NOTE — Telephone Encounter (Signed)
Re-sending remaining RFs on Rx Chelle sent in July. [facility] doesn't have those on file that they can find.

## 2015-11-25 ENCOUNTER — Other Ambulatory Visit: Payer: Self-pay | Admitting: Emergency Medicine

## 2015-11-25 DIAGNOSIS — I10 Essential (primary) hypertension: Secondary | ICD-10-CM

## 2015-11-25 MED ORDER — SPIRONOLACTONE 50 MG PO TABS: 50.0000 mg | ORAL_TABLET | Freq: Every day | ORAL | 3 refills | Status: DC

## 2015-11-25 MED ORDER — LISINOPRIL 40 MG PO TABS: 40.0000 mg | ORAL_TABLET | Freq: Every day | ORAL | 3 refills | Status: DC

## 2015-12-11 ENCOUNTER — Telehealth: Payer: Self-pay

## 2015-12-11 NOTE — Telephone Encounter (Signed)
Please call this patient.  Has his glucose been running this high for a while or just today?  He needs to drink LOTS of water and go for a brisk walk.  Avoid sugary drinks and meal tonight should be protein and vegetables (skip the starch).  If it's going up over night, he should go to the ED. If it remains this high, he needs to come in for re-evaluation and may need additional medications.

## 2015-12-11 NOTE — Telephone Encounter (Signed)
Wife states patient has been sleeping more lately x 3 days.I spoke with patient and he is appropriate on the phone. He has not been checking sugars regularly and wife advised he needs to check them.  426 BS at 320pm today  Is only on metformin now.  I advised he needs an evaluation now. I offered tomorrow and they declined, they stated they could come in Saturday to see a partner, transferred up front for an appointment. I stressed the need for them monitor and also go to the ER for treatment, which they declined today. chelles advice is given as well.  Wife did state she will call [phone] if he is lethargic because "she dealt with this with her dad"

## 2015-12-11 NOTE — Telephone Encounter (Signed)
Patient's wife requested to speak to Chelle.  Patients blood sugar is 426 and is asking for advice as to what to do.  Please call back as soon as possible.  Patient's number: [phone]

## 2015-12-12 ENCOUNTER — Encounter (HOSPITAL_COMMUNITY): Payer: Self-pay

## 2015-12-12 ENCOUNTER — Emergency Department (HOSPITAL_COMMUNITY)
Admission: EM | Admit: 2015-12-12 | Discharge: 2015-12-12 | Disposition: A | Discharge status: Home / Self Care | Payer: 59 | Attending: Emergency Medicine | Admitting: Emergency Medicine

## 2015-12-12 DIAGNOSIS — Z7984 Long term (current) use of oral hypoglycemic drugs: Secondary | ICD-10-CM | POA: Insufficient documentation

## 2015-12-12 DIAGNOSIS — Z87891 Personal history of nicotine dependence: Secondary | ICD-10-CM | POA: Diagnosis not present

## 2015-12-12 DIAGNOSIS — Z7982 Long term (current) use of aspirin: Secondary | ICD-10-CM | POA: Diagnosis not present

## 2015-12-12 DIAGNOSIS — E1165 Type 2 diabetes mellitus with hyperglycemia: Secondary | ICD-10-CM | POA: Diagnosis not present

## 2015-12-12 DIAGNOSIS — Z79899 Other long term (current) drug therapy: Secondary | ICD-10-CM | POA: Diagnosis not present

## 2015-12-12 DIAGNOSIS — I10 Essential (primary) hypertension: Secondary | ICD-10-CM | POA: Diagnosis not present

## 2015-12-12 DIAGNOSIS — R739 Hyperglycemia, unspecified: Secondary | ICD-10-CM

## 2015-12-12 LAB — URINALYSIS, ROUTINE W REFLEX MICROSCOPIC
BILIRUBIN URINE: NEGATIVE
Glucose, UA: >1000 mg/dL — AB
Hgb urine dipstick: NEGATIVE
KETONES UR: NEGATIVE mg/dL
LEUKOCYTES UA: NEGATIVE
NITRITE: NEGATIVE
PROTEIN: NEGATIVE mg/dL
Specific Gravity, Urine: 1.028 (ref 1.005–1.030)
pH: 5.5 (ref 5.0–8.0)

## 2015-12-12 LAB — CBC
HEMATOCRIT: 40.1 % (ref 39.0–52.0)
HEMOGLOBIN: 14.4 g/dL (ref 13.0–17.0)
MCH: 30.8 pg (ref 26.0–34.0)
MCHC: 35.9 g/dL (ref 30.0–36.0)
MCV: 85.9 fL (ref 78.0–100.0)
Platelets: 236 10*3/uL (ref 150–400)
RBC: 4.67 MIL/uL (ref 4.22–5.81)
RDW: 12.3 % (ref 11.5–15.5)
WBC: 9.6 10*3/uL (ref 4.0–10.5)

## 2015-12-12 LAB — URINE MICROSCOPIC-ADD ON
RBC / HPF: NONE SEEN RBC/hpf (ref 0–5)
WBC UA: NONE SEEN WBC/hpf (ref 0–5)

## 2015-12-12 LAB — CBG MONITORING, ED
GLUCOSE-CAPILLARY: 485 mg/dL — AB (ref 65–99)
Glucose-Capillary: 283 mg/dL — ABNORMAL HIGH (ref 65–99)

## 2015-12-12 LAB — BASIC METABOLIC PANEL
ANION GAP: 15 (ref 5–15)
BUN: 28 mg/dL — ABNORMAL HIGH (ref 6–20)
CALCIUM: 10.4 mg/dL — AB (ref 8.9–10.3)
CO2: 18 mmol/L — ABNORMAL LOW (ref 22–32)
Chloride: 98 mmol/L — ABNORMAL LOW (ref 101–111)
Creatinine, Ser: 1.09 mg/dL (ref 0.61–1.24)
GFR calc Af Amer: >60 mL/min (ref >60)
Glucose, Bld: 444 mg/dL — ABNORMAL HIGH (ref 65–99)
POTASSIUM: 4.7 mmol/L (ref 3.5–5.1)
SODIUM: 131 mmol/L — AB (ref 135–145)

## 2015-12-12 MED ORDER — SODIUM CHLORIDE 0.9 % IV BOLUS (SEPSIS)
1000.0000 mL | Freq: Once | INTRAVENOUS | Status: AC
  Administered 2015-12-12: 1000 mL via INTRAVENOUS

## 2015-12-12 NOTE — Discharge Instructions (Signed)
Take your normal medications. Follow-up with your doctor tomorrow.

## 2015-12-12 NOTE — ED Triage Notes (Signed)
Pt presents for evaluation of hyperglycemia this AM. Pt. States takes metformin twice daily, States has noticed a steady incline in BS over the past week. Pt. States this AM had CBG 240, pt. States he checked again around 0745 and meter read 502. States only had bowl of cereal this AM. Pt. Has no complaints otherwise, appointment with PCP tomorrow.

## 2015-12-12 NOTE — ED Provider Notes (Signed)
[facility]-EMER[facility]ENCY DEPT Provider Note   CSN: [id number] Arrival date & time: 12/12/15  4098     History   Chief Complaint Chief Complaint  Patient presents with  . Hyperglycemia    HPI Kyle [name] is a 60 y.o. male.  Patient is a known type II diabetic. His glucose's have been running high lately. Normally, they're in the 180-220 range. Lately they have been in the 300-400 range. He has not been ill of late.   No fever, sweats, chills, chest pain, dyspnea, dysuria. He been taking his medications as prescribed.      Past Medical History:  Diagnosis Date  . History of basal cell carcinoma of skin    shoulder  . HLD (hyperlipidemia)   . Hypertension   . Prediabetes     Patient Active Problem List   Diagnosis Date Noted  . 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 Arthritis, hip 01/26/2014  . PAD (peripheral artery disease) (HCC) 04/04/2012  . Cardiovascular risk factor 04/04/2012  . Hypertension 03/17/2012  . Diabetes (HCC) 03/17/2012  . Hyperlipidemia 03/17/2012  . 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 SPINE SURGERY  1994  . TONSILLECTOMY  1961       Home Medications    Prior to Admission medications   Medication Sig Start Date End Date Taking? Authorizing Provider  aspirin 81 MG tablet Take 81 mg by mouth daily.   Yes Historical Provider, MD  gemfibrozil (LOPID) 600 MG tablet Take 1 tablet (600 mg total) by mouth 2 (two) times daily before a meal. 08/26/15  Yes Chelle Jeffery, PA-C  hydrALAZINE (APRESOLINE) 100 MG tablet TAKE 1 TABLET THREE TIMES A DAY Patient taking differently: Take 100 mg by mouth 3 (three) times daily.  10/30/15  Yes Ethelda Chick, MD  lisinopril (PRINIVIL,ZESTRIL) 40 MG tablet Take 1 tablet (40 mg total) by mouth daily. 11/25/15  Yes Chelle Jeffery, PA-C  metFORMIN (GLUCOPHAGE) 500 MG tablet Take 2 tablets (1,000 mg total) by mouth 2 (two) times daily with a meal. 08/26/15  Yes Chelle Jeffery,  PA-C  metoprolol (LOPRESSOR) 50 MG tablet TAKE 1 TABLET TWICE A DAY Patient taking differently: Take 50 mg by mouth 2 (two) times daily.  08/26/15  Yes Chelle Jeffery, PA-C  spironolactone (ALDACTONE) 50 MG tablet Take 1 tablet (50 mg total) by mouth daily. 11/25/15  Yes Porfirio Oar, PA-C    Family History Family History  Problem Relation Age of Onset  . Diabetes Father   . Hypertension Father   . Hypertension Mother   . Hypertension Sister   . Diabetes Sister     diet controlled    Social History Social History  Substance Use Topics  . Smoking status: Former Smoker    Types: Cigarettes    Start date: 11/05/2007  . Smokeless tobacco: Never Used  . Alcohol use No     Allergies   Statins   Review of Systems Review of Systems  All other systems reviewed and are negative.    Physical Exam Updated Vital Signs BP 140/83   Pulse 74   Temp 98 F (36.7 C) (Oral)   Resp 12   Ht 5\' 10"  (1.778 m)   Wt 180 lb (81.6 kg)   SpO2 96%   BMI 25.83 kg/m   Physical Exam  Constitutional: He is oriented to person, place, and time. He appears well-developed and well-nourished.  HENT:  Head: Normocephalic and atraumatic.  Eyes: Conjunctivae are normal.  Neck: Neck supple.  Cardiovascular: Normal rate and regular rhythm.   Pulmonary/Chest: Effort normal and breath sounds normal.  Abdominal: Soft. Bowel sounds are normal.  Musculoskeletal: Normal range of motion.  Neurological: He is alert and oriented to person, place, and time.  Skin: Skin is warm and dry.  Psychiatric: He has a normal mood and affect. His behavior is normal.  Nursing note and vitals reviewed.    ED Treatments / Results  Labs (all labs ordered are listed, but only abnormal results are displayed) Labs Reviewed  BASIC METABOLIC PANEL - Abnormal; Notable for the following:       Result Value   Sodium 131 (*)    Chloride 98 (*)    CO2 18 (*)    Glucose, Bld 444 (*)    BUN 28 (*)    Calcium 10.4 (*)     All other components within normal limits  URINALYSIS, ROUTINE W REFLEX MICROSCOPIC (NOT AT [facility]) - Abnormal; Notable for the following:    Glucose, UA >1000 (*)    All other components within normal limits  URINE MICROSCOPIC-ADD ON - Abnormal; Notable for the following:    Squamous Epithelial / LPF 0-5 (*)    Bacteria, UA RARE (*)    All other components within normal limits  CBG MONITORING, ED - Abnormal; Notable for the following:    Glucose-Capillary 485 (*)    All other components within normal limits  CBG MONITORING, ED - Abnormal; Notable for the following:    Glucose-Capillary 283 (*)    All other components within normal limits  CBC    EKG  EKG Interpretation None       Radiology No results found.  Procedures Procedures (including critical care time)  Medications Ordered in ED Medications  sodium chloride 0.9 % bolus 1,000 mL (1,000 mLs Intravenous New Bag/Given 12/12/15 0931)  sodium chloride 0.9 % bolus 1,000 mL (1,000 mLs Intravenous New Bag/Given 12/12/15 0931)     Initial Impression / Assessment and Plan / ED Course  I have reviewed the triage vital signs and the nursing notes.  Pertinent labs & imaging results that were available during my care of the patient were reviewed by me and considered in my medical decision making (see chart for details).  Clinical Course    Glucose has improved with hydration only. He has a primary care visit scheduled for tomorrow. No evidence of DKA  Final Clinical Impressions(s) / ED Diagnoses   Final diagnoses:  Hyperglycemia    New Prescriptions New Prescriptions   No medications on file     Donnetta HutchingBrian Trino Higinbotham, MD 12/12/15 1339

## 2015-12-12 NOTE — ED Notes (Addendum)
PT CBG Was 283mg . Nurse notified.

## 2015-12-13 ENCOUNTER — Ambulatory Visit (INDEPENDENT_AMBULATORY_CARE_PROVIDER_SITE_OTHER): Payer: Managed Care, Other (non HMO) | Admitting: Family Medicine

## 2015-12-13 VITALS — BP 132/82 | HR 78 | Temp 98.3°F | Resp 17 | Ht 72.0 in | Wt 183.0 lb

## 2015-12-13 DIAGNOSIS — E118 Type 2 diabetes mellitus with unspecified complications: Secondary | ICD-10-CM | POA: Diagnosis not present

## 2015-12-13 LAB — COMPLETE METABOLIC PANEL WITH GFR
ALBUMIN: 4.6 g/dL (ref 3.6–5.1)
ALK PHOS: 65 U/L (ref 40–115)
ALT: 41 U/L (ref 9–46)
AST: 33 U/L (ref 10–35)
BUN: 21 mg/dL (ref 7–25)
CALCIUM: 10.5 mg/dL — AB (ref 8.6–10.3)
CHLORIDE: 101 mmol/L (ref 98–110)
CO2: 21 mmol/L (ref 20–31)
Creat: 1.05 mg/dL (ref 0.70–1.25)
GFR, EST NON AFRICAN AMERICAN: 76 mL/min (ref >=60)
GFR, Est African American: 88 mL/min (ref >=60)
Glucose, Bld: 312 mg/dL — ABNORMAL HIGH (ref 65–99)
POTASSIUM: 4.5 mmol/L (ref 3.5–5.3)
Sodium: 135 mmol/L (ref 135–146)
Total Bilirubin: 0.4 mg/dL (ref 0.2–1.2)
Total Protein: 7.4 g/dL (ref 6.1–8.1)

## 2015-12-13 LAB — CBC WITH DIFFERENTIAL/PLATELET
BASOS ABS: 79 {cells}/uL (ref 0–200)
Basophils Relative: 1 %
EOS ABS: 316 {cells}/uL (ref 15–500)
Eosinophils Relative: 4 %
HEMATOCRIT: 39.4 % (ref 38.5–50.0)
Hemoglobin: 13.9 g/dL (ref 13.2–17.1)
LYMPHS PCT: 36 %
Lymphs Abs: 2844 cells/uL (ref 850–3900)
MCH: 31.1 pg (ref 27.0–33.0)
MCHC: 35.3 g/dL (ref 32.0–36.0)
MCV: 88.1 fL (ref 80.0–100.0)
MONO ABS: 474 {cells}/uL (ref 200–950)
MONOS PCT: 6 %
MPV: 12.2 fL (ref 7.5–12.5)
NEUTROS PCT: 53 %
Neutro Abs: 4187 cells/uL (ref 1500–7800)
PLATELETS: 242 10*3/uL (ref 140–400)
RBC: 4.47 MIL/uL (ref 4.20–5.80)
RDW: 12.3 % (ref 11.0–15.0)
WBC: 7.9 10*3/uL (ref 3.8–10.8)

## 2015-12-13 LAB — LIPID PANEL
CHOL/HDL RATIO: 10.1 ratio — AB (ref <=5.0)
CHOLESTEROL: 292 mg/dL — AB (ref 125–200)
HDL: 29 mg/dL — AB (ref >=40)
TRIGLYCERIDES: 1697 mg/dL — AB (ref <150)

## 2015-12-13 LAB — THYROID PANEL WITH TSH
FREE THYROXINE INDEX: 2.4 (ref 1.4–3.8)
T3 Uptake: 29 % (ref 22–35)
T4, Total: 8.3 ug/dL (ref 4.5–12.0)
TSH: 1.03 mIU/L (ref 0.40–4.50)

## 2015-12-13 MED ORDER — GLIPIZIDE 5 MG PO TABS: 5.0000 mg | ORAL_TABLET | Freq: Every day | ORAL | 1 refills | Status: DC

## 2015-12-13 MED ORDER — GLIPIZIDE 5 MG PO TABS: 5.0000 mg | ORAL_TABLET | Freq: Every day | ORAL | 0 refills | Status: DC

## 2015-12-13 NOTE — Patient Instructions (Addendum)
Start Glipizide 5 mg once daily for diabetes.  Continue Metformin. Continue to check blood sugar at least once per day fasting and keep a log of readings to  determine if medication therapy is effective.  You have an appointment with Kyle [name] on November 14th and she will re-evaluate medication therapy at that time. Nice to meet you!  Godfrey PickKimberly S. Tiburcio PeaHarris, MSN, FNP-C [facility]  IF you received an x-ray today, you will receive an invoice from [facility]. Please contact [facility] at [phone] with questions or concerns regarding your invoice.   IF you received labwork today, you will receive an invoice from [facility]. Please contact [facility] at [phone] with questions or concerns regarding your invoice.   Our billing staff will not be able to assist you with questions regarding bills from these companies.  You will be contacted with the lab results as soon as they are available. The fastest way to get your results is to activate your My Chart account. Instructions are located on the last page of this paperwork. If you have not heard from us regarding the results in 2 weeks, please contact this office.

## 2015-12-13 NOTE — Progress Notes (Signed)
Patient ID: Kyle [name], male    DOB: 19-Nov-1955, 60 y.o.   MRN: [id number]  PCP: Porfirio Oarhelle Jeffery, PA-C  Chief Complaint  Patient presents with  . Diabetes    high blood sugar    Subjective:   HPI 60 year old male, presents for follow-up post admission from the emergency department times 1 day ago. He reports feeling ill yesterday. He checked his blood sugar and is was 502. He presented to the emergency department, 12/12/15 and upon further evaluation his blood glucose was elevated greater than 400. He reports that his blood sugar reading at home has steadily ranged in 300-400's when he monitors as at home. While at the ED he received 2 liter boluses of fluid and was advised to follow-up with is primary care office on today. Reports today his fasting blood sugar was 287. A1C when checked in the emergency department was 7.8. He denies any recent numbness of tingling of extremities, headaches, changes in urination patterns, or appetite. Reports increased fatigue.  Social History   Social History  . Marital status: Married    Spouse name: Dennie Bibleat  . Number of children: 2  . Years of education: GED   Occupational History  . retired 07/2015     former truck Hospital doctordriver, Museum/gallery exhibitions officerforklift driver   Social History Main Topics  . Smoking status: Former Smoker    Types: Cigarettes    Start date: 11/05/2007  . Smokeless tobacco: Never Used  . Alcohol use No  . Drug use: No  . Sexual activity: Yes    Partners: Female    Birth control/ protection: None   Other Topics Concern  . Not on file   Social History Narrative   Lives with his wife and their pets.   Daughters live in [place] and [place].   Son lives in [place].   Enjoying doing work around the house and yard.   Is thinking about part time work at some time.     Review of Systems See HPI  Patient Active Problem List   Diagnosis Date Noted  . 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 Arthritis, hip 01/26/2014  . PAD (peripheral artery disease)  (HCC) 04/04/2012  . Cardiovascular risk factor 04/04/2012  . Hypertension 03/17/2012  . Diabetes (HCC) 03/17/2012  . Hyperlipidemia 03/17/2012  . 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 Authorizing Provider  aspirin 81 MG tablet Take 81 mg by mouth daily.   Yes Historical Provider, MD  gemfibrozil (LOPID) 600 MG tablet Take 1 tablet (600 mg total) by mouth 2 (two) times daily before a meal. 08/26/15  Yes Chelle Jeffery, PA-C  hydrALAZINE (APRESOLINE) 100 MG tablet TAKE 1 TABLET THREE TIMES A DAY Patient taking differently: Take 100 mg by mouth 3 (three) times daily.  10/30/15  Yes Ethelda ChickKristi M Smith, MD  lisinopril (PRINIVIL,ZESTRIL) 40 MG tablet Take 1 tablet (40 mg total) by mouth daily. 11/25/15  Yes Chelle Jeffery, PA-C  metFORMIN (GLUCOPHAGE) 500 MG tablet Take 2 tablets (1,000 mg total) by mouth 2 (two) times daily with a meal. 08/26/15  Yes Chelle Jeffery, PA-C  metoprolol (LOPRESSOR) 50 MG tablet TAKE 1 TABLET TWICE A DAY Patient taking differently: Take 50 mg by mouth 2 (two) times daily.  08/26/15  Yes Chelle Jeffery, PA-C  spironolactone (ALDACTONE) 50 MG tablet Take 1 tablet (50 mg total) by mouth daily. 11/25/15  Yes Porfirio Oarhelle Jeffery, PA-C  Allergies  Allergen Reactions  . Statins     Leg cramps       Objective:  Physical Exam  Constitutional: He is oriented to person, place, and time. He appears well-developed.  HENT:  Head: Normocephalic and atraumatic.  Right Ear: External ear normal.  Left Ear: External ear normal.  Nose: Nose normal.  Eyes: Conjunctivae and EOM are normal. Pupils are equal, round, and reactive to light.  Neck: Normal range of motion. Neck supple. No thyromegaly present.  Cardiovascular: Normal rate, regular rhythm, normal heart sounds and intact distal pulses.   Pulmonary/Chest: Effort normal and breath sounds normal.  Musculoskeletal: Normal range of motion.  Neurological: He  is alert and oriented to person, place, and time.  Skin: Skin is warm and dry.  Psychiatric: He has a normal mood and affect. His behavior is normal. Judgment and thought content normal.      Vitals:   12/13/15 1124  BP: 132/82  Pulse: 78  Resp: 17  Temp: 98.3 F (36.8 C)   Assessment & Plan:  1. Type 2 diabetes mellitus with complication, without long-term current use of insulin (HCC) - COMPLETE METABOLIC PANEL WITH GFR - Lipid panel - CBC with Differential/Platelet - Thyroid Panel With TSH  Plan: Continue Metformin 1,000 mg twice daily with meals.  Start Glipizide (glucotrol) 5 mg daily with breakfast.  Continue to check blood sugar at least once per day fasting and keep a log of readings to determine if medication therapy is effective.   You have an appointment with Junius Creamerhelle Jeffries on November 14th and she will re-evaluate medication therapy at that time.  Godfrey PickKimberly S. Tiburcio PeaHarris, MSN, FNP-C [facility]

## 2015-12-18 ENCOUNTER — Telehealth: Payer: Self-pay

## 2015-12-18 MED ORDER — GLIPIZIDE 10 MG PO TABS: 10.0000 mg | ORAL_TABLET | Freq: Two times a day (BID) | ORAL | 1 refills | Status: DC

## 2015-12-18 NOTE — Telephone Encounter (Signed)
Returned call and left VM for patient to call back. I really would like to further inquire about the patients diet in addition I will increase his glipizide to 10 mg in morning with breakfast and add 5 mg with dinner initially.  Furthermore I would like him to start checking his blood sugar prior to bedtime and keep a log of those readings and continue checking fasting levels in the morning. Bring readings to follow-up appointment scheduled with Chelle. Call me next Monday if blood sugars remain elevated as we may need to schedule a return visit sooner than his appointment with Chelle.  Godfrey PickKimberly S. Tiburcio PeaHarris, MSN, FNP-C [facility]

## 2015-12-18 NOTE — Telephone Encounter (Signed)
Selena BattenKim, please see notes below from wife and advise.

## 2015-12-18 NOTE — Telephone Encounter (Signed)
Spoke with wife she understands message and will keep a log and will will RTC if it stay elevated

## 2015-12-18 NOTE — Telephone Encounter (Signed)
PATIENT'S WIFE (PATRICIA) STATES HER HUSBAND SAW KIMBERLY HARRIS ON Saturday FOR HIS DIABETES. HE WAS PUT ON ALIPIZIDE 5 MG TO TAKE 1 DOSE BY MOUTH WITH HIS BREAKFAST. SHE SAID HIS BLOOD SUGARS ARE STILL RUNNING HIGH. THIS MORNING IT WAS 335. Friday IT WAS 286. Thursday IT WAS 319. SHE WANTS TO KNOW IF SHE SHOULD HAVE HIM TAKE ANOTHER PILL AT SUPER TIME? BEST PHONE [phone] (CELL)  PHARMACY CHOICE IS [facility].  MBC

## 2015-12-30 ENCOUNTER — Encounter: Payer: Self-pay | Admitting: Physician Assistant

## 2015-12-30 ENCOUNTER — Ambulatory Visit (INDEPENDENT_AMBULATORY_CARE_PROVIDER_SITE_OTHER): Payer: Managed Care, Other (non HMO) | Admitting: Physician Assistant

## 2015-12-30 VITALS — BP 130/76 | HR 71 | Temp 98.0°F | Resp 16 | Ht 70.5 in | Wt 178.2 lb

## 2015-12-30 DIAGNOSIS — I10 Essential (primary) hypertension: Secondary | ICD-10-CM

## 2015-12-30 DIAGNOSIS — E119 Type 2 diabetes mellitus without complications: Secondary | ICD-10-CM | POA: Diagnosis not present

## 2015-12-30 DIAGNOSIS — E785 Hyperlipidemia, unspecified: Secondary | ICD-10-CM

## 2015-12-30 MED ORDER — LANCETS 30G MISC: 99 refills | Status: DC

## 2015-12-30 MED ORDER — GLUCOSE BLOOD VI STRP: ORAL_STRIP | 12 refills | Status: DC

## 2015-12-30 NOTE — Patient Instructions (Signed)
     IF you received an x-ray today, you will receive an invoice from [facility]. Please contact [facility] at [phone] with questions or concerns regarding your invoice.   IF you received labwork today, you will receive an invoice from [facility]. Please contact [facility] at [phone] with questions or concerns regarding your invoice.   Our billing staff will not be able to assist you with questions regarding bills from these companies.  You will be contacted with the lab results as soon as they are available. The fastest way to get your results is to activate your My Chart account. Instructions are located on the last page of this paperwork. If you have not heard from us regarding the results in 2 weeks, please contact this office.      

## 2015-12-30 NOTE — Progress Notes (Signed)
Patient ID: Kyle [name], male    DOB: March 10, 1955, 60 y.o.   MRN: [id number]  PCP: Porfirio Oarhelle Xavien Dauphinais, PA-C  Chief Complaint  Patient presents with  . Diabetes    bloodwork    Subjective:    HPI Presents for evaluation of Diabetes.  I last saw him in 08/2015, with plans to see him back this month. Unfortunately in late October, he had a significant elevation in the glucose. He was seen in the ED, though he reports he was feeling fine. He was hydrated with improvement in glucose from 444 to 283. He presented here the following day for ED follow-up and glipizide was added to his regimen. TG were up significantly, likely due to uncontrolled diabetes.  He is tolerating that well, and for the past week has had only one reading >200 (203 on 12/28/2015 before lunch), and two readings in the 110's. He continues to feel well. A1C not done recently, but won't be helpful today, given known elevation last month. Will defer to later date. Same with urine microalbumin.  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    Review of Systems Denies chest pain, shortness of breath, HA, dizziness, vision change, nausea, vomiting, diarrhea, constipation, melena, hematochezia, dysuria, increased urinary urgency or frequency, increased hunger or thirst, unintentional weight change, unexplained myalgias or arthralgias, rash.     Patient Active Problem List   Diagnosis Date Noted  . 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 Arthritis, hip 01/26/2014  . PAD (peripheral artery disease) (HCC) 04/04/2012  . Cardiovascular risk factor 04/04/2012  . Hypertension 03/17/2012  . Diabetes (HCC) 03/17/2012  . Hyperlipidemia 03/17/2012  . 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 Authorizing Provider  aspirin 81 MG tablet Take 81 mg by mouth daily.   Yes Historical Provider, MD  gemfibrozil  (LOPID) 600 MG tablet Take 1 tablet (600 mg total) by mouth 2 (two) times daily before a meal. 08/26/15  Yes Estel Tonelli, PA-C  glipiZIDE (GLUCOTROL) 10 MG tablet Take 1 tablet (10 mg total) by mouth 2 (two) times daily before a meal. 12/18/15  Yes Doyle AskewKimberly Stephenia Harris, FNP  hydrALAZINE (APRESOLINE) 100 MG tablet TAKE 1 TABLET THREE TIMES A DAY Patient taking differently: Take 100 mg by mouth 3 (three) times daily.  10/30/15  Yes Ethelda ChickKristi M Smith, MD  lisinopril (PRINIVIL,ZESTRIL) 40 MG tablet Take 1 tablet (40 mg total) by mouth daily. 11/25/15  Yes Robi Mitter, PA-C  metFORMIN (GLUCOPHAGE) 500 MG tablet Take 2 tablets (1,000 mg total) by mouth 2 (two) times daily with a meal. 08/26/15  Yes Tyresha Fede, PA-C  metoprolol (LOPRESSOR) 50 MG tablet TAKE 1 TABLET TWICE A DAY Patient taking differently: Take 50 mg by mouth 2 (two) times daily.  08/26/15  Yes Melvena Vink, PA-C  spironolactone (ALDACTONE) 50 MG tablet Take 1 tablet (50 mg total) by mouth daily. 11/25/15  Yes Eleanna Theilen, PA-C     Allergies  Allergen Reactions  . Statins     Leg cramps       Objective:  Physical Exam  Constitutional: He is oriented to person, place, and time. He appears well-developed and well-nourished. He is active and cooperative. No distress.  BP 130/76   Pulse 71   Temp 98 F (36.7 C) (Oral)   Resp 16   Ht 5' 10.5" (1.791 m)   Wt 178 lb 3.2 oz (  80.8 kg)   SpO2 99%   BMI 25.21 kg/m   HENT:  Head: Normocephalic and atraumatic.  Right Ear: Hearing normal.  Left Ear: Hearing normal.  Eyes: Conjunctivae are normal. No scleral icterus.  Neck: Normal range of motion. Neck supple. No thyromegaly present.  Cardiovascular: Normal rate, regular rhythm and normal heart sounds.   Pulses:      Radial pulses are 2+ on the right side, and 2+ on the left side.  Pulmonary/Chest: Effort normal and breath sounds normal.  Lymphadenopathy:       Head (right side): No tonsillar, no preauricular, no  posterior auricular and no occipital adenopathy present.       Head (left side): No tonsillar, no preauricular, no posterior auricular and no occipital adenopathy present.    He has no cervical adenopathy.       Right: No supraclavicular adenopathy present.       Left: No supraclavicular adenopathy present.  Neurological: He is alert and oriented to person, place, and time. No sensory deficit.  Skin: Skin is warm, dry and intact. No rash noted. No cyanosis or erythema. Nails show no clubbing.  Psychiatric: He has a normal mood and affect. His speech is normal and behavior is normal.           Assessment & Plan:   1. Type 2 diabetes mellitus without complication, without long-term current use of insulin (HCC) Recent loss of control, now improving. Diabetes education. Continue current regimen. Continue daily glucose checks, with alternating fasting and 2-3 hour post=prandial readings. Schedule eye exam. Re-evaluate in 4 weeks. - HM Diabetes Foot Exam - Ambulatory referral to diabetic education - glucose blood test strip; Use as instructed  Dispense: 100 each; Refill: 12 - Lancets 30G MISC; Use to check home glucose daily and as needed  Dispense: 100 each; Refill: prn  2. Hyperlipidemia, unspecified hyperlipidemia type TG elevated 10/28, at least in part due to elevated glucose. Recheck in 12-16 weeks.  3. Essential hypertension Controlled.   Fernande Brashelle S. Eustace Hur, PA-C Physician Assistant-Certified [facility]

## 2016-01-22 ENCOUNTER — Encounter: Payer: 59 | Attending: Physician Assistant | Admitting: Skilled Nursing Facility1

## 2016-01-22 ENCOUNTER — Encounter: Payer: Self-pay | Admitting: Skilled Nursing Facility1

## 2016-01-22 DIAGNOSIS — Z6824 Body mass index (BMI) 24.0-24.9, adult: Secondary | ICD-10-CM | POA: Diagnosis not present

## 2016-01-22 DIAGNOSIS — Z713 Dietary counseling and surveillance: Secondary | ICD-10-CM | POA: Diagnosis present

## 2016-01-22 DIAGNOSIS — E119 Type 2 diabetes mellitus without complications: Secondary | ICD-10-CM

## 2016-01-22 NOTE — Progress Notes (Signed)
Pt states he checks his blood sugar fasting, before lunch, and 2 hours after dinner alternating throughout the week. Pt states A nurse told them to stop eating carrots because they are full of sugars: Dietitian educated the pt on this topic. Pt states there is no way anyone is getting vegetables into my mouth. Diabetes Self-Management Education  Visit Type: First/Initial  Appt. Start Time: 10:20 Appt. End Time: 11:20  01/22/2016  Kyle [name], identified by name and date of birth, is a 60 y.o. male with a diagnosis of Diabetes: Type 2.   ASSESSMENT  Height 5\' 11"  (1.803 m), weight 173 lb (78.5 kg). Body mass index is 24.13 kg/m.      Diabetes Self-Management Education - 01/22/16 1027      Visit Information   Visit Type First/Initial     Initial Visit   Diabetes Type Type 2   Are you currently following a meal plan? No   Are you taking your medications as prescribed? Yes   Date Diagnosed 2015     Health Coping   How would you rate your overall health? Good     Psychosocial Assessment   Patient Belief/Attitude about Diabetes Motivated to manage diabetes     Pre-Education Assessment   Patient understands the diabetes disease and treatment process. Needs Instruction   Patient understands incorporating nutritional management into lifestyle. Needs Instruction   Patient undertands incorporating physical activity into lifestyle. Needs Instruction   Patient understands using medications safely. Needs Instruction   Patient understands monitoring blood glucose, interpreting and using results Needs Instruction   Patient understands prevention, detection, and treatment of acute complications. Needs Instruction   Patient understands prevention, detection, and treatment of chronic complications. Needs Instruction   Patient understands how to develop strategies to address psychosocial issues. Needs Instruction   Patient understands how to develop strategies to promote  health/change behavior. Needs Instruction     Complications   Last HgB A1C per patient/outside source 7.8 %   How often do you check your blood sugar? 1-2 times/day   Fasting Blood glucose range (mg/dL) [phone]   Postprandial Blood glucose range (mg/dL) 98-119;[phone];130-179   Have you had a dilated eye exam in the past 12 months? No   Have you had a dental exam in the past 12 months? No   Are you checking your feet? No     Dietary Intake   Breakfast cereal   Lunch sandwich and fruit bar   Snack (afternoon) chips---fruit   Dinner hamburger---steak---chicken, potatoes or rice or noodles or mac n cheese somtimes a vegetable with it   Beverage(s) water, 2% milk, creamer coffee     Exercise   Exercise Type ADL's     Patient Education   Previous Diabetes Education No   Disease state  Definition of diabetes, type 1 and 2, and the diagnosis of diabetes   Nutrition management  Role of diet in the treatment of diabetes and the relationship between the three main macronutrients and blood glucose level;Food label reading, portion sizes and measuring food.;Carbohydrate counting;Reviewed blood glucose goals for pre and post meals and how to evaluate the patients' food intake on their blood glucose level.;Meal timing in regards to the patients' current diabetes medication.;Information on hints to eating out and maintain blood glucose control.   Physical activity and exercise  Role of exercise on diabetes management, blood pressure control and cardiac health.   Monitoring Daily foot exams;Yearly dilated eye exam;Purpose and frequency of SMBG.   Chronic  complications Dental care;Assessed and discussed foot care and prevention of foot problems;Retinopathy and reason for yearly dilated eye exams     Individualized Goals (developed by patient)   Nutrition Follow meal plan discussed;Adjust meds/carbs with exercise as discussed   Physical Activity Exercise 1-2 times per week;15 minutes per day      Post-Education Assessment   Patient understands the diabetes disease and treatment process. Demonstrates understanding / competency   Patient understands incorporating nutritional management into lifestyle. Demonstrates understanding / competency   Patient undertands incorporating physical activity into lifestyle. Demonstrates understanding / competency   Patient understands using medications safely. Demonstrates understanding / competency   Patient understands monitoring blood glucose, interpreting and using results Demonstrates understanding / competency   Patient understands prevention, detection, and treatment of acute complications. Demonstrates understanding / competency   Patient understands prevention, detection, and treatment of chronic complications. Demonstrates understanding / competency   Patient understands how to develop strategies to address psychosocial issues. Demonstrates understanding / competency   Patient understands how to develop strategies to promote health/change behavior. Demonstrates understanding / competency     Outcomes   Expected Outcomes Demonstrated interest in learning. Expect positive outcomes   Future DMSE PRN   Program Status Completed      Individualized Plan for Diabetes Self-Management Training:   Learning Objective:  Patient will have a greater understanding of diabetes self-management. Patient education plan is to attend individual and/or group sessions per assessed needs and concerns.   Plan:   Patient Instructions  -Check your feet every day  -Before you eat in the morning you want your sugars to be under 130  -2 hours after you eat you want your sugars under 180   -Always bring your meter with you everywhere you go -Always Properly dispose of your needles:  -Discard in a hard plastic/metal container with a lid (something the needle can't puncture)  -Write Do Not Recycle on the outside of the container  -Example: A laundry detergent  bottle -Never use the same needle more than once -Eat 2-3 carbohydrate choices for each meal and 1 for each snack -A meal: carbohydrates, protein, vegetable -A snack: A Fruit OR Vegetable AND Protein  -Try to be more active -Always pay attention to your body keeping watchful of possible low blood sugar (below 70) or high blood sugar (above 200)    Expected Outcomes:  Demonstrated interest in learning. Expect positive outcomes  Education material provided: Living Well with Diabetes, Meal plan card, My Plate and Snack sheet  If problems or questions, patient to contact team via:  Phone  Future DSME appointment: PRN

## 2016-01-22 NOTE — Patient Instructions (Signed)
-  Check your feet every day  -Before you eat in the morning you want your sugars to be under 130  -2 hours after you eat you want your sugars under 180   -Always bring your meter with you everywhere you go -Always Properly dispose of your needles:  -Discard in a hard plastic/metal container with a lid (something the needle can't puncture)  -Write Do Not Recycle on the outside of the container  -Example: A laundry detergent bottle -Never use the same needle more than once -Eat 2-3 carbohydrate choices for each meal and 1 for each snack -A meal: carbohydrates, protein, vegetable -A snack: A Fruit OR Vegetable AND Protein  -Try to be more active -Always pay attention to your body keeping watchful of possible low blood sugar (below 70) or high blood sugar (above 200)

## 2016-01-27 ENCOUNTER — Ambulatory Visit (INDEPENDENT_AMBULATORY_CARE_PROVIDER_SITE_OTHER): Payer: Managed Care, Other (non HMO) | Admitting: Physician Assistant

## 2016-01-27 ENCOUNTER — Encounter: Payer: Self-pay | Admitting: Physician Assistant

## 2016-01-27 VITALS — BP 118/72 | HR 74 | Temp 98.6°F | Resp 18 | Ht 71.0 in | Wt 178.0 lb

## 2016-01-27 DIAGNOSIS — E119 Type 2 diabetes mellitus without complications: Secondary | ICD-10-CM

## 2016-01-27 NOTE — Patient Instructions (Addendum)
Keep up the great work!    IF you received an x-ray today, you will receive an invoice from [facility]. Please contact [facility] at [phone] with questions or concerns regarding your invoice.   IF you received labwork today, you will receive an invoice from [facility]. Please contact [facility] at [phone] with questions or concerns regarding your invoice.   Our billing staff will not be able to assist you with questions regarding bills from these companies.  You will be contacted with the lab results as soon as they are available. The fastest way to get your results is to activate your My Chart account. Instructions are located on the last page of this paperwork. If you have not heard from us regarding the results in 2 weeks, please contact this office.     

## 2016-01-27 NOTE — Progress Notes (Signed)
Patient ID: Kyle [name], male    DOB: [birth year], 60 y.o.   MRN: [id number]  PCP: Porfirio Oarhelle Zykeriah Mathia, PA-C  No chief complaint on file.   Subjective:   Presents for evaluation of diabetes. He is accompanied by his wife today.  09/2014 A1C was 7.8%. In October he had significant elevation of glucose, was started on glipizide and has started diabetes education classes. Presents today for re-evaluation of diabetes and for update of A1C.  In the last two weeks, glucose has ranged 78-204, only 2 readings >=200 Is waiting for new health benefits information to determine who is in network, then will schedule.Previously saw Dr. Dione BoozeGroat. He reports that he feels well. No problems/concerns.  Review of Systems  Constitutional: Negative for activity change, appetite change, fatigue and unexpected weight change.  HENT: Negative for congestion, dental problem, ear pain, hearing loss, mouth sores, postnasal drip, rhinorrhea, sneezing, sore throat, tinnitus and trouble swallowing.   Eyes: Negative for photophobia, pain, redness and visual disturbance.  Respiratory: Negative for cough, chest tightness and shortness of breath.   Cardiovascular: Negative for chest pain, palpitations and leg swelling.  Gastrointestinal: Negative for abdominal pain, blood in stool, constipation, diarrhea, nausea and vomiting.  Genitourinary: Negative for dysuria, frequency, hematuria and urgency.  Musculoskeletal: Negative for arthralgias, gait problem, myalgias and neck stiffness.  Skin: Negative for rash.  Neurological: Negative for dizziness, speech difficulty, weakness, light-headedness, numbness and headaches.  Hematological: Negative for adenopathy.  Psychiatric/Behavioral: Negative for confusion and sleep disturbance. The patient is not nervous/anxious.        Patient Active Problem List   Diagnosis Date Noted  . 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 Arthritis, hip 01/26/2014  . PAD (peripheral artery disease)  (HCC) 04/04/2012  . Cardiovascular risk factor 04/04/2012  . Hypertension 03/17/2012  . Diabetes (HCC) 03/17/2012  . Hyperlipidemia 03/17/2012  . 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 Authorizing Provider  aspirin 81 MG tablet Take 81 mg by mouth daily.    Historical Provider, MD  gemfibrozil (LOPID) 600 MG tablet Take 1 tablet (600 mg total) by mouth 2 (two) times daily before a meal. 08/26/15   Samia Kukla, PA-C  glipiZIDE (GLUCOTROL) 10 MG tablet Take 1 tablet (10 mg total) by mouth 2 (two) times daily before a meal. 12/18/15   Doyle AskewKimberly Stephenia Harris, FNP  glucose blood test strip Use as instructed 12/30/15   Katherleen Folkes, PA-C  hydrALAZINE (APRESOLINE) 100 MG tablet TAKE 1 TABLET THREE TIMES A DAY Patient taking differently: Take 100 mg by mouth 3 (three) times daily.  10/30/15   Ethelda ChickKristi M Smith, MD  Lancets 30G MISC Use to check home glucose daily and as needed 12/30/15   Loranda Mastel, PA-C  lisinopril (PRINIVIL,ZESTRIL) 40 MG tablet Take 1 tablet (40 mg total) by mouth daily. 11/25/15   Keiaira Donlan, PA-C  metFORMIN (GLUCOPHAGE) 500 MG tablet Take 2 tablets (1,000 mg total) by mouth 2 (two) times daily with a meal. 08/26/15   Iysis Germain, PA-C  metoprolol (LOPRESSOR) 50 MG tablet TAKE 1 TABLET TWICE A DAY Patient taking differently: Take 50 mg by mouth 2 (two) times daily.  08/26/15   Acsa Estey, PA-C  spironolactone (ALDACTONE) 50 MG tablet Take 1 tablet (50 mg total) by mouth daily. 11/25/15   Porfirio Oarhelle Sedona Wenk, PA-C     Allergies  Allergen Reactions  . Statins     Leg cramps  Objective:  Physical Exam  Constitutional: He is oriented to person, place, and time. He appears well-developed and well-nourished. He is active and cooperative. No distress.  BP 118/72 (BP Location: Right Arm, Patient Position: Sitting, Cuff Size: Small)   Pulse 74   Temp 98.6 F (37 C) (Oral)   Resp 18   Ht  5\' 11"  (1.803 m)   Wt 178 lb (80.7 kg)   SpO2 98%   BMI 24.83 kg/m   HENT:  Head: Normocephalic and atraumatic.  Right Ear: Hearing normal.  Left Ear: Hearing normal.  Eyes: Conjunctivae are normal. No scleral icterus.  Neck: Normal range of motion. Neck supple. No thyromegaly present.  Cardiovascular: Normal rate, regular rhythm and normal heart sounds.   Pulses:      Radial pulses are 2+ on the right side, and 2+ on the left side.  Pulmonary/Chest: Effort normal and breath sounds normal.  Lymphadenopathy:       Head (right side): No tonsillar, no preauricular, no posterior auricular and no occipital adenopathy present.       Head (left side): No tonsillar, no preauricular, no posterior auricular and no occipital adenopathy present.    He has no cervical adenopathy.       Right: No supraclavicular adenopathy present.       Left: No supraclavicular adenopathy present.  Neurological: He is alert and oriented to person, place, and time. No sensory deficit.  Skin: Skin is warm, dry and intact. No rash noted. No cyanosis or erythema. Nails show no clubbing.  Psychiatric: He has a normal mood and affect. His speech is normal and behavior is normal.           Assessment & Plan:   1. Type 2 diabetes mellitus without complication, without long-term current use of insulin (HCC) Expect some elevation today, given the significantly elevated readings in October. Continue healthy lifestyle changes. Plan re-evaluation in 3 months with lipids, CMET. Once glucose is well controlled, plan to switch from glipizide to a newer agent like Invokana or Farxiga. - Hemoglobin A1c   Fernande Brashelle S. Siegfried Vieth, PA-C Physician Assistant-Certified [facility]

## 2016-01-28 LAB — HEMOGLOBIN A1C
Est. average glucose Bld gHb Est-mCnc: 163 mg/dL
HEMOGLOBIN A1C: 7.3 % — AB (ref 4.8–5.6)

## 2016-03-14 ENCOUNTER — Encounter (HOSPITAL_COMMUNITY): Payer: Self-pay | Admitting: *Deleted

## 2016-03-14 ENCOUNTER — Emergency Department (HOSPITAL_COMMUNITY): Payer: PRIVATE HEALTH INSURANCE

## 2016-03-14 DIAGNOSIS — E119 Type 2 diabetes mellitus without complications: Secondary | ICD-10-CM | POA: Diagnosis not present

## 2016-03-14 DIAGNOSIS — I10 Essential (primary) hypertension: Secondary | ICD-10-CM | POA: Diagnosis not present

## 2016-03-14 DIAGNOSIS — Z7984 Long term (current) use of oral hypoglycemic drugs: Secondary | ICD-10-CM | POA: Insufficient documentation

## 2016-03-14 DIAGNOSIS — Z7982 Long term (current) use of aspirin: Secondary | ICD-10-CM | POA: Diagnosis not present

## 2016-03-14 DIAGNOSIS — Z79899 Other long term (current) drug therapy: Secondary | ICD-10-CM | POA: Insufficient documentation

## 2016-03-14 DIAGNOSIS — Z87891 Personal history of nicotine dependence: Secondary | ICD-10-CM | POA: Insufficient documentation

## 2016-03-14 DIAGNOSIS — R42 Dizziness and giddiness: Secondary | ICD-10-CM | POA: Diagnosis present

## 2016-03-14 LAB — CBC
HCT: 41.5 % (ref 39.0–52.0)
Hemoglobin: 14.6 g/dL (ref 13.0–17.0)
MCH: 31.3 pg (ref 26.0–34.0)
MCHC: 35.2 g/dL (ref 30.0–36.0)
MCV: 89.1 fL (ref 78.0–100.0)
PLATELETS: 275 10*3/uL (ref 150–400)
RBC: 4.66 MIL/uL (ref 4.22–5.81)
RDW: 12.9 % (ref 11.5–15.5)
WBC: 10.2 10*3/uL (ref 4.0–10.5)

## 2016-03-14 LAB — TROPONIN I

## 2016-03-14 LAB — BASIC METABOLIC PANEL
Anion gap: 8 (ref 5–15)
BUN: 29 mg/dL — AB (ref 6–20)
CALCIUM: 9.7 mg/dL (ref 8.9–10.3)
CHLORIDE: 111 mmol/L (ref 101–111)
CO2: 22 mmol/L (ref 22–32)
Creatinine, Ser: 1.02 mg/dL (ref 0.61–1.24)
GFR calc non Af Amer: >60 mL/min (ref >60)
Glucose, Bld: 140 mg/dL — ABNORMAL HIGH (ref 65–99)
Potassium: 4 mmol/L (ref 3.5–5.1)
SODIUM: 141 mmol/L (ref 135–145)

## 2016-03-14 NOTE — ED Triage Notes (Signed)
The pt is c/o palpitations dizziness  For 1-2 weeks  He feels like he is going to pass out intermittently

## 2016-03-15 ENCOUNTER — Emergency Department (HOSPITAL_COMMUNITY)
Admission: EM | Admit: 2016-03-15 | Discharge: 2016-03-15 | Disposition: A | Discharge status: Home / Self Care | Payer: PRIVATE HEALTH INSURANCE | Attending: Emergency Medicine | Admitting: Emergency Medicine

## 2016-03-15 DIAGNOSIS — R42 Dizziness and giddiness: Secondary | ICD-10-CM

## 2016-03-15 LAB — D-DIMER, QUANTITATIVE: D-Dimer, Quant: 0.5 ug/mL-FEU (ref 0.00–0.50)

## 2016-03-15 MED ORDER — SODIUM CHLORIDE 0.9 % IV BOLUS (SEPSIS)
1000.0000 mL | Freq: Once | INTRAVENOUS | Status: AC
  Administered 2016-03-15: 1000 mL via INTRAVENOUS

## 2016-03-15 NOTE — Discharge Instructions (Signed)
FOLLOW UP WITH YOUR PRIMARY CARE DOCTOR THIS WEEK FOR ONGOING EVALUATION OF EPISODIC SYMPTOMS OF LIGHTHEADEDNESS. RETURN HERE WITH ANY NEW SYMPTOMS OR CONCERN.

## 2016-03-15 NOTE — ED Provider Notes (Signed)
[facility]-EMERGENCY DEPT Provider Note   CSN: [id number] Arrival date & time: 03/14/16  2239     History   Chief Complaint Chief Complaint  Patient presents with  . Dizziness    HPI Kyle [name] is a 61 y.o. male.  Patient presents with episodes of lightheadedness, "like the lights are going out", cold sweats, "tunnel vision" that have been going on for the past 2 weeks. The episodes are becoming more frequent and of longer duration. No full syncope, chest pain, SOB, nausea or vomiting. He denies headaches, difficulty speaking or lateralizing weakness. He has been eating and drinking per usual and does not feel he is dehydrated. He reports having Glipizide added to medication regimen for control of blood sugar 2 months ago without interim symptoms. He is currently asymptomatic.   The history is provided by the patient and the spouse. No language interpreter was used.    Past Medical History:  Diagnosis Date  . Diabetes mellitus without complication (HCC)   . History of basal cell carcinoma of skin    shoulder  . HLD (hyperlipidemia)   . Hypertension   . Prediabetes     Patient Active Problem List   Diagnosis Date Noted  . 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 Arthritis, hip 01/26/2014  . PAD (peripheral artery disease) (HCC) 04/04/2012  . Cardiovascular risk factor 04/04/2012  . Hypertension 03/17/2012  . Diabetes (HCC) 03/17/2012  . Hyperlipidemia 03/17/2012  . 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 SPINE SURGERY  1994  . TONSILLECTOMY  1961       Home Medications    Prior to Admission medications   Medication Sig Start Date End Date Taking? Authorizing Provider  aspirin 81 MG tablet Take 81 mg by mouth daily.   Yes Historical Provider, MD  gemfibrozil (LOPID) 600 MG tablet Take 1 tablet (600 mg total) by mouth 2 (two) times daily before a meal. 08/26/15  Yes Chelle Jeffery, PA-C  glipiZIDE (GLUCOTROL) 10 MG tablet  Take 1 tablet (10 mg total) by mouth 2 (two) times daily before a meal. 12/18/15  Yes Doyle Askew, FNP  hydrALAZINE (APRESOLINE) 100 MG tablet TAKE 1 TABLET THREE TIMES A DAY Patient taking differently: Take 100 mg by mouth 3 (three) times daily.  10/30/15  Yes Ethelda Chick, MD  lisinopril (PRINIVIL,ZESTRIL) 40 MG tablet Take 1 tablet (40 mg total) by mouth daily. 11/25/15  Yes Chelle Jeffery, PA-C  metFORMIN (GLUCOPHAGE) 500 MG tablet Take 2 tablets (1,000 mg total) by mouth 2 (two) times daily with a meal. 08/26/15  Yes Chelle Jeffery, PA-C  metoprolol (LOPRESSOR) 50 MG tablet TAKE 1 TABLET TWICE A DAY Patient taking differently: Take 50 mg by mouth 2 (two) times daily.  08/26/15  Yes Chelle Jeffery, PA-C  spironolactone (ALDACTONE) 50 MG tablet Take 1 tablet (50 mg total) by mouth daily. 11/25/15  Yes Chelle Jeffery, PA-C  glucose blood test strip Use as instructed 12/30/15   Porfirio Oar, PA-C  Lancets 30G MISC Use to check home glucose daily and as needed 12/30/15   Porfirio Oar, PA-C    Family History Family History  Problem Relation Age of Onset  . Diabetes Father   . Hypertension Father   . Hypertension Mother   . Hypertension Sister   . Diabetes Sister     diet controlled    Social History Social History  Substance Use Topics  . Smoking status: Former Smoker    Types:  Cigarettes    Start date: 11/05/2007  . Smokeless tobacco: Never Used  . Alcohol use No     Allergies   Statins   Review of Systems Review of Systems  Constitutional: Positive for diaphoresis. Negative for chills and fever.  HENT: Negative.   Eyes: Positive for visual disturbance.  Respiratory: Negative.   Cardiovascular: Negative.  Negative for leg swelling.  Gastrointestinal: Positive for nausea and vomiting. Negative for abdominal pain.  Musculoskeletal: Negative.   Skin: Negative.   Neurological: Positive for light-headedness. Negative for headaches.     Physical  Exam Updated Vital Signs BP 150/91   Pulse 88   Temp 97.9 F (36.6 C) (Oral)   Resp 10   Ht 5' 10.5" (1.791 m)   Wt 80.7 kg   SpO2 97%   BMI 25.18 kg/m   Physical Exam  Constitutional: He is oriented to person, place, and time. He appears well-developed and well-nourished.  HENT:  Head: Normocephalic.  Neck: Normal range of motion. Neck supple.  Cardiovascular: Normal rate and regular rhythm.   No murmur heard. Pulmonary/Chest: Effort normal and breath sounds normal. He has no wheezes. He has no rales.  Abdominal: Soft. Bowel sounds are normal. There is no tenderness. There is no rebound and no guarding.  Musculoskeletal: Normal range of motion.  Neurological: He is alert and oriented to person, place, and time.  Skin: Skin is warm and dry. No rash noted.  Psychiatric: He has a normal mood and affect.     ED Treatments / Results  Labs (all labs ordered are listed, but only abnormal results are displayed) Labs Reviewed  BASIC METABOLIC PANEL - Abnormal; Notable for the following:       Result Value   Glucose, Bld 140 (*)    BUN 29 (*)    All other components within normal limits  CBC  TROPONIN I    EKG  EKG Interpretation None       Radiology Dg Chest 2 View  Result Date: 03/14/2016 CLINICAL DATA:  Dizziness. Rapid heart rate. Mid upper chest pain yesterday. EXAM: CHEST  2 VIEW COMPARISON:  None. FINDINGS: The cardiomediastinal contours are normal. Atherosclerosis of the aortic arch. Borderline hyperinflation. Pulmonary vasculature is normal. No consolidation, pleural effusion, or pneumothorax. No acute osseous abnormalities are seen. Mild loss of height of several thoracic vertebral bodies appears chronic. IMPRESSION: No active cardiopulmonary disease. Mild aortic atherosclerosis. Electronically Signed   By: Rubye OaksMelanie  Ehinger M.D.   On: 03/14/2016 23:17    Procedures Procedures (including critical care time)  Medications Ordered in ED Medications - No data to  display   Initial Impression / Assessment and Plan / ED Course  I have reviewed the triage vital signs and the nursing notes.  Pertinent labs & imaging results that were available during my care of the patient were reviewed by me and considered in my medical decision making (see chart for details).     Patient with symptoms of lightheadedness x 2 weeks, worsening over time. Currently asymptomatic. No stroke symptoms, no syncope.  VSS, no orthostatic hypotension. Labs are essentially unremarkable, including troponin and d-dimer. CXR clear. EKG sinus tachycardia, which has resolved. On re-evaluation the patient remains comfortable.   Discussed evaluation and importance of follow up this week with primary care physician. Patient and wife are comfortable with discharge home. Return precautions discussed.   Final Clinical Impressions(s) / ED Diagnoses   Final diagnoses:  None   1. Lightheadedness  New Prescriptions New Prescriptions  No medications on file     Elpidio Anis, PA-C 03/15/16 1610    Gilda Crease, MD 03/15/16 [phone]

## 2016-03-15 NOTE — ED Notes (Signed)
Pt understood dc material. NAD noted. 

## 2016-03-16 ENCOUNTER — Telehealth: Payer: Self-pay

## 2016-03-16 NOTE — Telephone Encounter (Signed)
Replied to patient in My Chart. If his symptoms of LIGHTHEADEDNESS have resolved, and he feels well, he does not need to come in. If he has any persistent symptoms, then he needs to come in for re-evaluation.

## 2016-03-16 NOTE — Telephone Encounter (Signed)
Kyle [name] patient wanted to inform us he is feeling, but states he wants to know if he needs to come in for a follow-up, he was seen in the ED yesterday , for a elevated heart.

## 2016-03-16 NOTE — Telephone Encounter (Signed)
Pt is needing Chelle to give them a call regarding pt's ER visit  Best number [phone]

## 2016-03-25 ENCOUNTER — Telehealth: Payer: Self-pay

## 2016-03-25 NOTE — Telephone Encounter (Signed)
Left message for patient to call back and reschedule his 04/27/16 appt with Chelle since she will be out of the office that day. Anyone can make him an appointment on her next available day. Thank you!

## 2016-04-27 ENCOUNTER — Ambulatory Visit: Payer: Managed Care, Other (non HMO) | Admitting: Physician Assistant

## 2016-05-28 ENCOUNTER — Other Ambulatory Visit: Payer: Self-pay | Admitting: Family Medicine

## 2016-06-01 ENCOUNTER — Ambulatory Visit (INDEPENDENT_AMBULATORY_CARE_PROVIDER_SITE_OTHER): Payer: PRIVATE HEALTH INSURANCE | Admitting: Physician Assistant

## 2016-06-01 ENCOUNTER — Encounter: Payer: Self-pay | Admitting: Physician Assistant

## 2016-06-01 VITALS — BP 196/96 | HR 57 | Temp 98.0°F | Resp 18 | Ht 70.5 in | Wt 183.6 lb

## 2016-06-01 DIAGNOSIS — E119 Type 2 diabetes mellitus without complications: Secondary | ICD-10-CM

## 2016-06-01 DIAGNOSIS — I10 Essential (primary) hypertension: Secondary | ICD-10-CM

## 2016-06-01 DIAGNOSIS — E269 Hyperaldosteronism, unspecified: Secondary | ICD-10-CM | POA: Diagnosis not present

## 2016-06-01 DIAGNOSIS — E785 Hyperlipidemia, unspecified: Secondary | ICD-10-CM | POA: Diagnosis not present

## 2016-06-01 DIAGNOSIS — M1610 Unilateral primary osteoarthritis, unspecified hip: Secondary | ICD-10-CM

## 2016-06-01 NOTE — Assessment & Plan Note (Signed)
Now on the RIGHT. Improved some with indomethacin. Will let me know if it persists. Would refer back to Dr. Darrelyn Hillock.

## 2016-06-01 NOTE — Assessment & Plan Note (Signed)
Await labs. Adjust regimen as indicated by results.  

## 2016-06-01 NOTE — Assessment & Plan Note (Addendum)
Uncontrolled HTN here today. Refer to nephrology. Prefers local specialist rather than driving back to [place].

## 2016-06-01 NOTE — Assessment & Plan Note (Addendum)
Uncontrolled today, atypical for him. Reportedly 130's/80's at home. Heart rate very low normal, increasing metoprolol dose not appropriate. Refer to local nephrologist (previously seen in [place]). May also need cardiology evaluation if palpitations persist. To ED for CP, SOB, HA, dizziness, weakness, slurred speech, confusion, etc.
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
## 2016-06-01 NOTE — Patient Instructions (Addendum)
[facility]  [address], [place], [place] [address]  Phone: [phone]  [facility] [address], [place], [place] [address]  Phone: [phone]  [facility] [address], [place], [place] [address]  Phone: [phone]      IF you received an x-ray today, you will receive an invoice from [facility]. Please contact [facility] at [phone] with questions or concerns regarding your invoice.   IF you received labwork today, you will receive an invoice from [facility]. Please contact [facility] at [phone] with questions or concerns regarding your invoice.   Our billing staff will not be able to assist you with questions regarding bills from these companies.  You will be contacted with the lab results as soon as they are available. The fastest way to get your results is to activate your My Chart account. Instructions are located on the last page of this paperwork. If you have not heard from us regarding the results in 2 weeks, please contact this office.      

## 2016-06-01 NOTE — Progress Notes (Signed)
Patient ID: Kyle [name], male    DOB: [birth year], 60 y.o.   MRN: [id number]  PCP: Porfirio Oar, PA-C  Chief Complaint  Patient presents with  . Labs Only    fasting labs   . Follow-up    DM    Subjective:   Presents for evaluation of diabetes and HTN, hyperlipidemia.  He is accompanied by his wife. He feels well, without problems or concerns.  Occasionally still has heart palpitations, for which he takes an extra dose of metoprolol with relief.  No CP, SOB, HA, dizziness. No nausea, vomiting. No urinary symptoms.  He has a history of aldosteronism, for which he saw nephrology in [place], and started spironolactone.   He tolerates his medications without difficulty.  A1C was 7.3% 01/27/2016. Thyroid studies were normal 12/13/2015.  Having some RIGHT hip pain, which he believes is arthritis or a lipoma he has in the buttock. It started after playing tackle football with his son and grandson. He had some leftover ?indomethacin? from Dr. Darrelyn Hillock for LEFT hip pain, which has helped some. Cause drowsiness, so he only takes them at HS. One day he experienced some tingling into the RIGHT leg. Still occurs when standing. The pain is constant. History of back surgery in 1992-07-12.  His father died 2 weeks ago. He apparently arrested while at his PCP office being evaluated for respiratory illness (family thought he probably had pneumonia). Emergency attention regained cardiac rhythm, but he never regained consciousness and required mechanical ventilations. The family made the decision to withdraw support.  Final diagnosis was influenza.    Review of Systems Denies chest pain, shortness of breath, HA, dizziness, vision change, nausea, vomiting, diarrhea, constipation, melena, hematochezia, dysuria, increased urinary urgency or frequency, increased hunger or thirst, unintentional weight change, other unexplained myalgias or arthralgias, rash.     Patient Active Problem  List   Diagnosis Date Noted  . 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 Arthritis, hip 01/26/2014  . PAD (peripheral artery disease) (HCC) 04/04/2012  . Cardiovascular risk factor 04/04/2012  . Hypertension 03/17/2012  . Diabetes (HCC) 03/17/2012  . Hyperlipidemia 03/17/2012  . 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 Authorizing Provider  aspirin 81 MG tablet Take 81 mg by mouth daily.   Yes Historical Provider, MD  gemfibrozil (LOPID) 600 MG tablet Take 1 tablet (600 mg total) by mouth 2 (two) times daily before a meal. 08/26/15  Yes Kimiyo Carmicheal, PA-C  glipiZIDE (GLUCOTROL) 10 MG tablet TAKE 1 TABLET TWICE A DAY BEFORE MEALS 05/29/16  Yes Zoe A Creta Levin, MD  glucose blood test strip Use as instructed 12/30/15  Yes Milt Coye, PA-C  hydrALAZINE (APRESOLINE) 100 MG tablet TAKE 1 TABLET THREE TIMES A DAY Patient taking differently: Take 100 mg by mouth 3 (three) times daily.  10/30/15  Yes Ethelda Chick, MD  Lancets 30G MISC Use to check home glucose daily and as needed 12/30/15  Yes Kanchan Gal, PA-C  lisinopril (PRINIVIL,ZESTRIL) 40 MG tablet Take 1 tablet (40 mg total) by mouth daily. 11/25/15  Yes Diondre Pulis, PA-C  metFORMIN (GLUCOPHAGE) 500 MG tablet Take 2 tablets (1,000 mg total) by mouth 2 (two) times daily with a meal. 08/26/15  Yes Johnathon Mittal, PA-C  metoprolol (LOPRESSOR) 50 MG tablet TAKE 1 TABLET TWICE A DAY Patient taking differently: Take 50 mg by mouth 2 (two) times daily.  08/26/15  Yes Taniya Dasher, PA-C  spironolactone (  ALDACTONE) 50 MG tablet Take 1 tablet (50 mg total) by mouth daily. 11/25/15  Yes Jeanne Terrance, PA-C     Allergies  Allergen Reactions  . Statins     Leg cramps       Objective:  Physical Exam  Constitutional: He is oriented to person, place, and time. He appears well-developed and well-nourished. He is active and cooperative. No distress.  BP (!) 196/96   Pulse (!)  57   Temp 98 F (36.7 C) (Oral)   Resp 18   Ht 5' 10.5" (1.791 m)   Wt 183 lb 9.6 oz (83.3 kg)   SpO2 97%   BMI 25.97 kg/m   HENT:  Head: Normocephalic and atraumatic.  Right Ear: Hearing normal.  Left Ear: Hearing normal.  Eyes: Conjunctivae are normal. No scleral icterus.  Neck: Normal range of motion. Neck supple. No thyromegaly present.  Cardiovascular: Normal rate, regular rhythm and normal heart sounds.   Pulses:      Radial pulses are 2+ on the right side, and 2+ on the left side.  Pulmonary/Chest: Effort normal and breath sounds normal.  Lymphadenopathy:       Head (right side): No tonsillar, no preauricular, no posterior auricular and no occipital adenopathy present.       Head (left side): No tonsillar, no preauricular, no posterior auricular and no occipital adenopathy present.    He has no cervical adenopathy.       Right: No supraclavicular adenopathy present.       Left: No supraclavicular adenopathy present.  Neurological: He is alert and oriented to person, place, and time. No sensory deficit.  Skin: Skin is warm, dry and intact. No rash noted. No cyanosis or erythema. Nails show no clubbing.  Psychiatric: He has a normal mood and affect. His speech is normal and behavior is normal.      Wt Readings from Last 3 Encounters:  06/01/16 183 lb 9.6 oz (83.3 kg)  03/14/16 178 lb (80.7 kg)  01/27/16 178 lb (80.7 kg)        Assessment & Plan:   Problem List Items Addressed This Visit    Arthritis, hip (Chronic)    Now on the RIGHT. Improved some with indomethacin. Will let me know if it persists. Would refer back to Dr. Darrelyn Hillock.      Hypertension - Primary    Uncontrolled today, atypical for him. Reportedly 130's/80's at home. Heart rate very low normal, increasing metoprolol dose not appropriate. Refer to local nephrologist (previously seen in [place]). May also need cardiology evaluation if palpitations persist. To ED for CP, SOB, HA, dizziness,  weakness, slurred speech, confusion, etc.       Relevant Orders   CBC with Differential/Platelet   Ambulatory referral to Nephrology   Diabetes [facility])    Await labs. Adjust regimen as indicated by results.       Relevant Orders   Comprehensive metabolic panel   Hemoglobin A1c   Hyperlipidemia    Await labs. Adjust regimen as indicated by results.       Relevant Orders   Lipid panel   Aldosteronism (HCC)    Uncontrolled HTN here today. Refer to nephrology. Prefers local specialist rather than driving back to [place].      Relevant Orders   Ambulatory referral to Nephrology       Return in about 3 months (around 08/31/2016) for re-evaluation and fasting labs.   Fernande Bras, PA-C [facility]

## 2016-06-02 LAB — CBC WITH DIFFERENTIAL/PLATELET
BASOS: 1 %
Basophils Absolute: 0 10*3/uL (ref 0.0–0.2)
EOS (ABSOLUTE): 0.3 10*3/uL (ref 0.0–0.4)
Eos: 4 %
HEMATOCRIT: 38.4 % (ref 37.5–51.0)
HEMOGLOBIN: 13.4 g/dL (ref 13.0–17.7)
IMMATURE GRANULOCYTES: 0 %
Immature Grans (Abs): 0 10*3/uL (ref 0.0–0.1)
LYMPHS ABS: 2.5 10*3/uL (ref 0.7–3.1)
Lymphs: 31 %
MCH: 30.2 pg (ref 26.6–33.0)
MCHC: 34.9 g/dL (ref 31.5–35.7)
MCV: 87 fL (ref 79–97)
MONOCYTES: 8 %
MONOS ABS: 0.7 10*3/uL (ref 0.1–0.9)
Neutrophils Absolute: 4.5 10*3/uL (ref 1.4–7.0)
Neutrophils: 56 %
Platelets: 284 10*3/uL (ref 150–379)
RBC: 4.44 x10E6/uL (ref 4.14–5.80)
RDW: 14.1 % (ref 12.3–15.4)
WBC: 8.1 10*3/uL (ref 3.4–10.8)

## 2016-06-02 LAB — COMPREHENSIVE METABOLIC PANEL
A/G RATIO: 1.7 (ref 1.2–2.2)
ALBUMIN: 4.6 g/dL (ref 3.6–4.8)
ALT: 23 IU/L (ref 0–44)
AST: 24 IU/L (ref 0–40)
Alkaline Phosphatase: 60 IU/L (ref 39–117)
BUN/Creatinine Ratio: 22 (ref 10–24)
BUN: 22 mg/dL (ref 8–27)
Bilirubin Total: 0.3 mg/dL (ref 0.0–1.2)
CALCIUM: 10.2 mg/dL (ref 8.6–10.2)
CO2: 21 mmol/L (ref 18–29)
CREATININE: 0.99 mg/dL (ref 0.76–1.27)
Chloride: 103 mmol/L (ref 96–106)
GFR calc Af Amer: 94 mL/min/{1.73_m2} (ref >59)
GFR, EST NON AFRICAN AMERICAN: 81 mL/min/{1.73_m2} (ref >59)
GLOBULIN, TOTAL: 2.7 g/dL (ref 1.5–4.5)
Glucose: 150 mg/dL — ABNORMAL HIGH (ref 65–99)
POTASSIUM: 4.4 mmol/L (ref 3.5–5.2)
SODIUM: 143 mmol/L (ref 134–144)
Total Protein: 7.3 g/dL (ref 6.0–8.5)

## 2016-06-02 LAB — LIPID PANEL
Chol/HDL Ratio: 6.6 ratio — ABNORMAL HIGH (ref 0.0–5.0)
Cholesterol, Total: 210 mg/dL — ABNORMAL HIGH (ref 100–199)
HDL: 32 mg/dL — AB (ref >39)
LDL Calculated: 116 mg/dL — ABNORMAL HIGH (ref 0–99)
Triglycerides: 308 mg/dL — ABNORMAL HIGH (ref 0–149)
VLDL CHOLESTEROL CAL: 62 mg/dL — AB (ref 5–40)

## 2016-06-02 LAB — HEMOGLOBIN A1C
Est. average glucose Bld gHb Est-mCnc: 128 mg/dL
Hgb A1c MFr Bld: 6.1 % — ABNORMAL HIGH (ref 4.8–5.6)

## 2016-06-03 ENCOUNTER — Encounter (HOSPITAL_COMMUNITY): Payer: Self-pay | Admitting: Emergency Medicine

## 2016-06-03 ENCOUNTER — Other Ambulatory Visit: Payer: Self-pay | Admitting: Physician Assistant

## 2016-06-03 ENCOUNTER — Emergency Department (HOSPITAL_COMMUNITY): Payer: PRIVATE HEALTH INSURANCE

## 2016-06-03 ENCOUNTER — Emergency Department (HOSPITAL_COMMUNITY)
Admission: EM | Admit: 2016-06-03 | Discharge: 2016-06-03 | Disposition: A | Discharge status: Home / Self Care | Payer: PRIVATE HEALTH INSURANCE | Attending: Emergency Medicine | Admitting: Emergency Medicine

## 2016-06-03 DIAGNOSIS — Z7984 Long term (current) use of oral hypoglycemic drugs: Secondary | ICD-10-CM | POA: Insufficient documentation

## 2016-06-03 DIAGNOSIS — I10 Essential (primary) hypertension: Secondary | ICD-10-CM | POA: Insufficient documentation

## 2016-06-03 DIAGNOSIS — E119 Type 2 diabetes mellitus without complications: Secondary | ICD-10-CM | POA: Diagnosis not present

## 2016-06-03 DIAGNOSIS — M5431 Sciatica, right side: Secondary | ICD-10-CM | POA: Insufficient documentation

## 2016-06-03 DIAGNOSIS — Z87891 Personal history of nicotine dependence: Secondary | ICD-10-CM | POA: Diagnosis not present

## 2016-06-03 DIAGNOSIS — Z7982 Long term (current) use of aspirin: Secondary | ICD-10-CM | POA: Insufficient documentation

## 2016-06-03 DIAGNOSIS — M79604 Pain in right leg: Secondary | ICD-10-CM | POA: Diagnosis present

## 2016-06-03 LAB — CBC
HEMATOCRIT: 38.3 % — AB (ref 39.0–52.0)
Hemoglobin: 13.3 g/dL (ref 13.0–17.0)
MCH: 30.3 pg (ref 26.0–34.0)
MCHC: 34.7 g/dL (ref 30.0–36.0)
MCV: 87.2 fL (ref 78.0–100.0)
PLATELETS: 270 10*3/uL (ref 150–400)
RBC: 4.39 MIL/uL (ref 4.22–5.81)
RDW: 13.4 % (ref 11.5–15.5)
WBC: 9.1 10*3/uL (ref 4.0–10.5)

## 2016-06-03 LAB — COMPREHENSIVE METABOLIC PANEL
ALBUMIN: 4.3 g/dL (ref 3.5–5.0)
ALT: 27 U/L (ref 17–63)
AST: 25 U/L (ref 15–41)
Alkaline Phosphatase: 51 U/L (ref 38–126)
Anion gap: 10 (ref 5–15)
BUN: 15 mg/dL (ref 6–20)
CHLORIDE: 107 mmol/L (ref 101–111)
CO2: 23 mmol/L (ref 22–32)
CREATININE: 0.97 mg/dL (ref 0.61–1.24)
Calcium: 9.6 mg/dL (ref 8.9–10.3)
GFR calc Af Amer: >60 mL/min (ref >60)
GLUCOSE: 168 mg/dL — AB (ref 65–99)
POTASSIUM: 3.5 mmol/L (ref 3.5–5.1)
Sodium: 140 mmol/L (ref 135–145)
Total Bilirubin: 0.9 mg/dL (ref 0.3–1.2)
Total Protein: 7.3 g/dL (ref 6.5–8.1)

## 2016-06-03 LAB — TROPONIN I: Troponin I: <0.03 ng/mL (ref <0.03)

## 2016-06-03 MED ORDER — LABETALOL HCL 100 MG PO TABS: 100.0000 mg | ORAL_TABLET | Freq: Two times a day (BID) | ORAL | 0 refills | Status: DC

## 2016-06-03 MED ORDER — LABETALOL HCL 200 MG PO TABS
100.0000 mg | ORAL_TABLET | Freq: Once | ORAL | Status: AC
  Administered 2016-06-03: 100 mg via ORAL
  Filled 2016-06-03: qty 1

## 2016-06-03 MED ORDER — TRAMADOL HCL 50 MG PO TABS: 50.0000 mg | ORAL_TABLET | Freq: Four times a day (QID) | ORAL | 0 refills | Status: DC | PRN

## 2016-06-03 NOTE — ED Provider Notes (Addendum)
[facility]-EMERGENCY DEPT Provider Note   CSN: [id number] Arrival date & time: 06/03/16  1138   By signing my name below, I, Kyle [name], attest that this documentation has been prepared under the direction and in the presence of Linwood Dibbles, MD . Electronically Signed: Freida [name], Scribe. 06/03/2016. 12:42 PM.   History   Chief Complaint Chief Complaint  Patient presents with  . Leg Pain   The history is provided by the patient. No language interpreter was used.     HPI Comments:  Kyle [name] is a 61 y.o. male who presents to the Emergency Department complaining of moderate constant RLE pain x 1 week. He has pain from the hip region down to the foot and pain is exacerbated when ambulating. Pt reports striking the extremity on a door and notes h/o similar pain when ghe "bangs" the leg on something. He also reports h/o PAD and was concerned his pain may be due to this. Pt is a former smoker. No alleviating factors noted.   He is also concerned about his BP. He reports elevated BP for the last few days, BPs of around 221/109. He denies recent changes in BP meds. He also denies CP and SOB. He notes mild HA PTA but none at this time. His PCP has recommend nephrology follow up.   Past Medical History:  Diagnosis Date  . Diabetes mellitus without complication (HCC)   . History of basal cell carcinoma of skin    shoulder  . HLD (hyperlipidemia)   . Hypertension   . Prediabetes     Patient Active Problem List   Diagnosis Date Noted  . 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 Arthritis, hip 01/26/2014  . PAD (peripheral artery disease) (HCC) 04/04/2012  . Cardiovascular risk factor 04/04/2012  . Hypertension 03/17/2012  . Diabetes (HCC) 03/17/2012  . Hyperlipidemia 03/17/2012  . 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 SPINE SURGERY  1994  . TONSILLECTOMY  1961       Home Medications    Prior to Admission medications   Medication  Sig Start Date End Date Taking? Authorizing Provider  aspirin 81 MG tablet Take 81 mg by mouth daily.    Historical Provider, MD  gemfibrozil (LOPID) 600 MG tablet Take 1 tablet (600 mg total) by mouth 2 (two) times daily before a meal. 08/26/15   Chelle Jeffery, PA-C  glipiZIDE (GLUCOTROL) 10 MG tablet TAKE 1 TABLET TWICE A DAY BEFORE MEALS 05/29/16   Doristine Bosworth, MD  glucose blood test strip Use as instructed 12/30/15   Chelle Jeffery, PA-C  hydrALAZINE (APRESOLINE) 100 MG tablet TAKE 1 TABLET THREE TIMES A DAY Patient taking differently: Take 100 mg by mouth 3 (three) times daily.  10/30/15   Ethelda Chick, MD  labetalol (NORMODYNE) 100 MG tablet Take 1 tablet (100 mg total) by mouth 2 (two) times daily. 06/03/16   Linwood Dibbles, MD  Lancets 30G MISC Use to check home glucose daily and as needed 12/30/15   Chelle Jeffery, PA-C  lisinopril (PRINIVIL,ZESTRIL) 40 MG tablet Take 1 tablet (40 mg total) by mouth daily. 11/25/15   Chelle Jeffery, PA-C  metFORMIN (GLUCOPHAGE) 500 MG tablet Take 2 tablets (1,000 mg total) by mouth 2 (two) times daily with a meal. 08/26/15   Chelle Jeffery, PA-C  spironolactone (ALDACTONE) 50 MG tablet Take 1 tablet (50 mg total) by mouth daily. 11/25/15   Chelle Jeffery, PA-C  traMADol (ULTRAM) 50 MG tablet Take  1 tablet (50 mg total) by mouth every 6 (six) hours as needed. 06/03/16   Linwood Dibbles, MD    Family History Family History  Problem Relation Age of Onset  . Diabetes Father   . Hypertension Father   . Hypertension Mother   . Hypertension Sister   . Diabetes Sister     diet controlled    Social History Social History  Substance Use Topics  . Smoking status: Former Smoker    Types: Cigarettes    Start date: 11/05/2007  . Smokeless tobacco: Never Used  . Alcohol use No     Allergies   Statins   Review of Systems Review of Systems  Constitutional: Negative for chills and fever.       +elevated BP  Respiratory: Negative for shortness of breath.     Cardiovascular: Negative for chest pain.  Musculoskeletal: Positive for myalgias.  Neurological: Positive for headaches (resolved).     Physical Exam Updated Vital Signs BP (!) 200/92 (BP Location: Right Arm)   Pulse 70   Temp 98.6 F (37 C) (Oral)   Resp 18   SpO2 98%   Physical Exam  Constitutional: He appears well-developed and well-nourished. No distress.  HENT:  Head: Normocephalic and atraumatic.  Right Ear: External ear normal.  Left Ear: External ear normal.  Eyes: Conjunctivae are normal. Right eye exhibits no discharge. Left eye exhibits no discharge. No scleral icterus.  Neck: Neck supple. No tracheal deviation present.  Cardiovascular: Normal rate, regular rhythm and intact distal pulses.   Pulmonary/Chest: Effort normal and breath sounds normal. No stridor. No respiratory distress. He has no wheezes. He has no rales.  Abdominal: Soft. Bowel sounds are normal. He exhibits no distension. There is no tenderness. There is no rebound and no guarding.  Musculoskeletal: He exhibits no edema or tenderness.  Mild TTP right buttock  5/5 lower extremity strength Normal sensation  Strong DP pulse  Foot is warm and well perfused   Neurological: He is alert. He has normal strength. No cranial nerve deficit (no facial droop, extraocular movements intact, no slurred speech) or sensory deficit. He exhibits normal muscle tone. He displays no seizure activity. Coordination normal.  Skin: Skin is warm and dry. No rash noted.  Psychiatric: He has a normal mood and affect.  Nursing note and vitals reviewed.    ED Treatments / Results  DIAGNOSTIC STUDIES:  Oxygen Saturation is 96% on RA, normal by my interpretation.    COORDINATION OF CARE:  12:30 PM Discussed treatment plan with pt at bedside and pt agreed to plan.  Labs (all labs ordered are listed, but only abnormal results are displayed) Labs Reviewed  COMPREHENSIVE METABOLIC PANEL - Abnormal; Notable for the following:        Result Value   Glucose, Bld 168 (*)    All other components within normal limits  CBC - Abnormal; Notable for the following:    HCT 38.3 (*)    All other components within normal limits  TROPONIN I    EKG  EKG Interpretation  Date/Time:  Thursday June 03 2016 12:50:46 EDT Ventricular Rate:  70 PR Interval:  174 QRS Duration: 86 QT Interval:  410 QTC Calculation: 442 R Axis:   56 Text Interpretation:  Normal sinus rhythm Normal ECG Since last tracing rate slower Confirmed by Amedeo Detweiler  MD-J, Hurshell Dino ([id number]) on 06/03/2016 2:05:35 PM       Radiology Dg Lumbar Spine Complete  Result Date: 06/03/2016 CLINICAL DATA:  Back  pain. Right lower extremity pain. No reported injury. EXAM: LUMBAR SPINE - COMPLETE 4+ VIEW COMPARISON:  None. FINDINGS: This report assumes 5 non rib-bearing lumbar vertebrae. Lumbar vertebral body heights are preserved, with no fracture. Moderate multilevel degenerative disc disease in the visualized thoracolumbar spine, most prominent at L3-4. Minimal 2 mm retrolisthesis at L3-4. Mild facet arthropathy bilaterally in the lower lumbar spine. No aggressive appearing focal osseous lesions. Abdominal aortic atherosclerosis. Cholelithiasis. IMPRESSION: 1. No lumbar spine fracture. 2. Moderate multilevel degenerative disc disease, most prominent at L3-4. 3. Minimal 2 mm retrolisthesis at L3-4. 4. Mild facet arthropathy in the lower lumbar spine . 5. Aortic atherosclerosis . 6. Cholelithiasis. Electronically Signed   By: Delbert Phenix M.D.   On: 06/03/2016 13:31   Dg Hip Unilat W Or Wo Pelvis 2-3 Views Right  Result Date: 06/03/2016 CLINICAL DATA:  Right leg pain.  No known injury. EXAM: DG HIP (WITH OR WITHOUT PELVIS) 2-3V RIGHT COMPARISON:  No recent prior . FINDINGS: Degenerative changes lumbar spine and both hips. No acute bony abnormality identified. No evidence of fracture or dislocation. IMPRESSION: 1. Degenerative changes lumbar spine and both hips. No acute abnormality.  2. Peripheral vascular disease. Electronically Signed   By: Maisie Fus  Register   On: 06/03/2016 13:29    Procedures Procedures (including critical care time)  Medications Ordered in ED Medications  labetalol (NORMODYNE) tablet 100 mg (100 mg Oral Given 06/03/16 1425)     Initial Impression / Assessment and Plan / ED Course  I have reviewed the triage vital signs and the nursing notes.  Pertinent labs & imaging results that were available during my care of the patient were reviewed by me and considered in my medical decision making (see chart for details).   patient presented to the emergency room with 2 separate issues. He has been having trouble with hypertension. Patient states he saw his doctor yesterday for that. He is getting a referral to a nephrologist. He was given medications in the office but the patient states he was not started on any additional medications. He remains hypertensive today. No signs of any acute complications associated with his hypertension.I will have him try taking labetalol and have him hold his metoprolol. Patient was given a dose of oral labetalol.  I suspect his back pain may be radicular in nature. He has normal pulses. Normal sensation. Discussed outpatient follow-up. I will give him a prescription for Ultram.  Final Clinical Impressions(s) / ED Diagnoses   Final diagnoses:  Essential hypertension  Sciatica of right side    New Prescriptions New Prescriptions   LABETALOL (NORMODYNE) 100 MG TABLET    Take 1 tablet (100 mg total) by mouth 2 (two) times daily.   TRAMADOL (ULTRAM) 50 MG TABLET    Take 1 tablet (50 mg total) by mouth every 6 (six) hours as needed.   I personally performed the services described in this documentation, which was scribed in my presence.  The recorded information has been reviewed and is accurate.     Linwood Dibbles, MD 06/03/16 1451    Linwood Dibbles, MD 06/03/16 1452

## 2016-06-03 NOTE — ED Triage Notes (Signed)
Pt sts right leg pain and htn x 1 week

## 2016-06-03 NOTE — ED Notes (Signed)
Patient transported to X-ray 

## 2016-06-03 NOTE — Discharge Instructions (Signed)
Start taking the labetalol instead of the metoprolol. Follow-up with your primary doctor and nephrologist as planned. Return to the emergency room as needed for worsening symptoms

## 2016-06-03 NOTE — ED Notes (Signed)
Patient presents to ed c/o elevated b/p this am ,. 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 States he went to work today and his right leg was hurting also has tingling.

## 2016-06-03 NOTE — Telephone Encounter (Signed)
Pts wife called to check on pts BP medications.  She states that he needs all of them filled.  He did see Chelle on Tuesday for his BP follow up visit.  Pt still uses the [facility] off [address] and are currently at the pharmacy now.  Please advise [phone]

## 2016-06-04 NOTE — Telephone Encounter (Signed)
So, the patient has refills of Hydralazine, lisinopril and spironolactone, via [facility]. Please clarify that he wants these sent to the local pharmacy instead.  Dr. Lynelle Doctor refilled the labetalol yesterday.

## 2016-06-05 MED ORDER — LISINOPRIL 40 MG PO TABS
40.0000 mg | ORAL_TABLET | Freq: Every day | ORAL | 0 refills | Status: DC
Start: 2016-06-05 — End: 2016-08-31

## 2016-06-05 MED ORDER — SPIRONOLACTONE 50 MG PO TABS: 50.0000 mg | ORAL_TABLET | Freq: Every day | ORAL | 0 refills | Status: DC

## 2016-06-05 MED ORDER — HYDRALAZINE HCL 100 MG PO TABS
ORAL_TABLET | ORAL | 0 refills | Status: DC
Start: 2016-06-05 — End: 2016-08-31

## 2016-06-29 ENCOUNTER — Encounter: Payer: Self-pay | Admitting: Physician Assistant

## 2016-06-29 ENCOUNTER — Ambulatory Visit (INDEPENDENT_AMBULATORY_CARE_PROVIDER_SITE_OTHER): Payer: PRIVATE HEALTH INSURANCE | Admitting: Physician Assistant

## 2016-06-29 ENCOUNTER — Telehealth: Payer: Self-pay

## 2016-06-29 VITALS — BP 182/83 | HR 58 | Temp 97.3°F | Resp 19 | Ht 70.47 in | Wt 186.0 lb

## 2016-06-29 DIAGNOSIS — I10 Essential (primary) hypertension: Secondary | ICD-10-CM | POA: Diagnosis not present

## 2016-06-29 DIAGNOSIS — E119 Type 2 diabetes mellitus without complications: Secondary | ICD-10-CM

## 2016-06-29 DIAGNOSIS — M51369 Other intervertebral disc degeneration, lumbar region without mention of lumbar back pain or lower extremity pain: Secondary | ICD-10-CM

## 2016-06-29 DIAGNOSIS — E785 Hyperlipidemia, unspecified: Secondary | ICD-10-CM | POA: Diagnosis not present

## 2016-06-29 DIAGNOSIS — M5136 Other intervertebral disc degeneration, lumbar region: Secondary | ICD-10-CM

## 2016-06-29 DIAGNOSIS — I739 Peripheral vascular disease, unspecified: Secondary | ICD-10-CM

## 2016-06-29 DIAGNOSIS — M1610 Unilateral primary osteoarthritis, unspecified hip: Secondary | ICD-10-CM

## 2016-06-29 MED ORDER — GEMFIBROZIL 600 MG PO TABS: 600.0000 mg | ORAL_TABLET | Freq: Two times a day (BID) | ORAL | 3 refills | Status: DC

## 2016-06-29 MED ORDER — GLIPIZIDE 10 MG PO TABS: ORAL_TABLET | ORAL | 3 refills | Status: DC

## 2016-06-29 MED ORDER — LABETALOL HCL 200 MG PO TABS: 200.0000 mg | ORAL_TABLET | Freq: Two times a day (BID) | ORAL | 0 refills | Status: DC

## 2016-06-29 MED ORDER — DICLOFENAC SODIUM 1 % TD GEL: 2.0000 g | Freq: Four times a day (QID) | TRANSDERMAL | 1 refills | Status: DC | PRN

## 2016-06-29 MED ORDER — METFORMIN HCL 500 MG PO TABS: 1000.0000 mg | ORAL_TABLET | Freq: Two times a day (BID) | ORAL | 3 refills | Status: DC

## 2016-06-29 NOTE — Telephone Encounter (Signed)
CMA faxed order for Labetalol faxed to [phone]. Confirmation faxed received.

## 2016-06-29 NOTE — Patient Instructions (Signed)
     IF you received an x-ray today, you will receive an invoice from [facility]. Please contact [facility] at [phone] with questions or concerns regarding your invoice.   IF you received labwork today, you will receive an invoice from [facility]. Please contact [facility] at [phone] with questions or concerns regarding your invoice.   Our billing staff will not be able to assist you with questions regarding bills from these companies.  You will be contacted with the lab results as soon as they are available. The fastest way to get your results is to activate your My Chart account. Instructions are located on the last page of this paperwork. If you have not heard from us regarding the results in 2 weeks, please contact this office.     

## 2016-07-01 NOTE — Assessment & Plan Note (Signed)
Remains uncontrolled. Increase labetalol to 200 mg BID, which is his previous dose of this medication. Proceed with nephrology visit, as planned.

## 2016-07-01 NOTE — Assessment & Plan Note (Signed)
Trial of diclofenac gel

## 2016-07-01 NOTE — Assessment & Plan Note (Signed)
May need referral back to  [facility]. He'd prefer to wait for now.

## 2016-07-01 NOTE — Assessment & Plan Note (Signed)
Tolerating current treatment. Needs refills.

## 2016-07-01 NOTE — Progress Notes (Signed)
Patient ID: Kyle DuffelJames [name], male    DOB: [birth year], 61 y.o.   MRN: [id number]  PCP: Porfirio OarJeffery, Shaunika Italiano, PA-C  Chief Complaint  Patient presents with  . Hospitalization Follow-up    06/02/2016  . Medication Refill    Glipizide, Metformin, Gemfibrozil    Subjective:   Presents for hospital follow-up, and for medication refills. He is accompanied by his wife.  I saw him on 06/01/2016 for routine evaluation. He noted RIGHT hip pain following a game of tackle football with his grandson. He took some leftover indomethacin at HS, with some benefit. He had had one episode of some numbness in the leg. He had surgery on the low back in 1994. He wasn't interested in radiographs at the time, and we agreed that he would continue the HS dose of indomethacin and he would let me know if he had worsening pain or was ready to go back to orthopedics. BP was 196/96, but he reported 130's/80's at home. Due to relative bradycardia, increasing metoprolol was not appropriate, and he was referred to nephrology.  He presented to the ED 2 days later with RIGHT leg pain, extending from the hip to the foot, constant, worse with ambulation. He related a history of PAD and was concerned that the pain may be due to that. He was also concerned about his elevated BP x several days. BP there was 200/92. No findings suggestive of CVA, poor LE circulation. He was advised to stop the metoprolol and replace with labetalol (which he had used previously, at a higher dose). Leg pain suspected to be radicular from the low back, per the ED note, though the patient did not understand that. Tramadol prescribed.  He tells me that the radiographs showed that he is "Full of arthritis." Has been using the metoprolol midday to address heart palpitations.  Tramadol was ineffective. Ibuprofen 800 mg QHS was somewhat helpful, but the benefit lasted only 3-4 hours.  Restarted indomethacin 50 mg Q12 hours, instead of ibuprofen, on  06/20/11/2018. Helps some, but temporarily.  On 06/22/2016 he tasted blood in his mouth after clearing his throat. No recurrence. No heartburn, indigestion, globus sensation. No abdominal pain, nausea, increased belching. No melena, hematochezia.  Home glucose readings 60-223. In the mornings, 140-180 Before lunch 60-120   Review of Systems As above. No CP, SOB, HA, dizziness. No weakness, new paresthesias or muscle/joint pain. No facial droop, mental status changes. No fever, chills.    Patient Active Problem List   Diagnosis Date Noted  . DDD (degenerative disc disease), lumbar 06/29/2016  . 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 Arthritis, hip 01/26/2014  . PAD (peripheral artery disease) (HCC) 04/04/2012  . Cardiovascular risk factor 04/04/2012  . Hypertension 03/17/2012  . Diabetes (HCC) 03/17/2012  . Hyperlipidemia 03/17/2012  . 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 Authorizing Provider  aspirin 81 MG tablet Take 81 mg by mouth daily.    [provider]  diclofenac sodium (VOLTAREN) 1 % GEL Apply 2 g topically 4 (four) times daily as needed. 06/29/16   Porfirio OarJeffery, Drago Hammonds, PA-C  gemfibrozil (LOPID) 600 MG tablet Take 1 tablet (600 mg total) by mouth 2 (two) times daily before a meal. 06/29/16   Kelen Laura, PA-C  glipiZIDE (GLUCOTROL) 10 MG tablet TAKE 1 TABLET TWICE A DAY BEFORE MEALS 06/29/16   Porfirio OarJeffery, Marketta Valadez, PA-C  glucose blood test strip Use as instructed 12/30/15   Leotis ShamesJeffery,  Noorah Giammona, PA-C  hydrALAZINE (APRESOLINE) 100 MG tablet TAKE 1 TABLET THREE TIMES A DAY 06/05/16   Kathelyn Gombos, PA-C  labetalol (NORMODYNE) 200 MG tablet Take 1 tablet (200 mg total) by mouth 2 (two) times daily. 06/29/16   Porfirio Oar, PA-C  Lancets 30G MISC Use to check home glucose daily and as needed 12/30/15   Porfirio Oar, PA-C  lisinopril (PRINIVIL,ZESTRIL) 40 MG tablet Take 1 tablet (40 mg total) by mouth  daily. 06/05/16   Porfirio Oar, PA-C  metFORMIN (GLUCOPHAGE) 500 MG tablet Take 2 tablets (1,000 mg total) by mouth 2 (two) times daily with a meal. 06/29/16   Mayrani Khamis, PA-C  spironolactone (ALDACTONE) 50 MG tablet Take 1 tablet (50 mg total) by mouth daily. 06/05/16   Porfirio Oar, PA-C     Allergies  Allergen Reactions  . Statins     Leg cramps       Objective:  Physical Exam  Constitutional: He is oriented to person, place, and time. He appears well-developed and well-nourished. He is active and cooperative. No distress.  BP (!) 182/83   Pulse (!) 58   Temp 97.3 F (36.3 C) (Oral)   Resp 19   Ht 5' 10.47" (1.79 m)   Wt 186 lb (84.4 kg)   SpO2 97%   BMI 26.33 kg/m   HENT:  Head: Normocephalic and atraumatic.  Right Ear: Hearing normal.  Left Ear: Hearing normal.  Eyes: Conjunctivae are normal. No scleral icterus.  Neck: Normal range of motion. Neck supple. No thyromegaly present.  Cardiovascular: Normal rate, regular rhythm and normal heart sounds.   Pulses:      Radial pulses are 2+ on the right side, and 2+ on the left side.  Pulmonary/Chest: Effort normal and breath sounds normal.  Lymphadenopathy:       Head (right side): No tonsillar, no preauricular, no posterior auricular and no occipital adenopathy present.       Head (left side): No tonsillar, no preauricular, no posterior auricular and no occipital adenopathy present.    He has no cervical adenopathy.       Right: No supraclavicular adenopathy present.       Left: No supraclavicular adenopathy present.  Neurological: He is alert and oriented to person, place, and time. No sensory deficit.  Skin: Skin is warm, dry and intact. No rash noted. No cyanosis or erythema. Nails show no clubbing.     Psychiatric: He has a normal mood and affect. His speech is normal and behavior is normal.   We reviewed the radiographs performed in the ED together, including viewing the actual images. Dg Lumbar Spine  Complete  Result Date: 06/03/2016 CLINICAL DATA:  Back pain. Right lower extremity pain. No reported injury. EXAM: LUMBAR SPINE - COMPLETE 4+ VIEW COMPARISON:  None. FINDINGS: This report assumes 5 non rib-bearing lumbar vertebrae. Lumbar vertebral body heights are preserved, with no fracture. Moderate multilevel degenerative disc disease in the visualized thoracolumbar spine, most prominent at L3-4. Minimal 2 mm retrolisthesis at L3-4. Mild facet arthropathy bilaterally in the lower lumbar spine. No aggressive appearing focal osseous lesions. Abdominal aortic atherosclerosis. Cholelithiasis. IMPRESSION: 1. No lumbar spine fracture. 2. Moderate multilevel degenerative disc disease, most prominent at L3-4. 3. Minimal 2 mm retrolisthesis at L3-4. 4. Mild facet arthropathy in the lower lumbar spine . 5. Aortic atherosclerosis . 6. Cholelithiasis. Electronically Signed   By: Delbert Phenix M.D.   On: 06/03/2016 13:31   Dg Hip Unilat W Or Wo Pelvis 2-3 Views  Right  Result Date: 06/03/2016 CLINICAL DATA:  Right leg pain.  No known injury. EXAM: DG HIP (WITH OR WITHOUT PELVIS) 2-3V RIGHT COMPARISON:  No recent prior . FINDINGS: Degenerative changes lumbar spine and both hips. No acute bony abnormality identified. No evidence of fracture or dislocation. IMPRESSION: 1. Degenerative changes lumbar spine and both hips. No acute abnormality. 2. Peripheral vascular disease. Electronically Signed   By: Maisie Fus  Register   On: 06/03/2016 13:29      Assessment & Plan:   Problem List Items Addressed This Visit    Arthritis, hip (Chronic)    Trial of diclofenac gel.      Relevant Medications   diclofenac sodium (VOLTAREN) 1 % GEL   Hypertension - Primary    Remains uncontrolled. Increase labetalol to 200 mg BID, which is his previous dose of this medication. Proceed with nephrology visit, as planned.      Relevant Medications   labetalol (NORMODYNE) 200 MG tablet   gemfibrozil (LOPID) 600 MG tablet    Hyperlipidemia    Stressed the importance of good control, given his cardiovascular risk and known PVD.      Relevant Medications   labetalol (NORMODYNE) 200 MG tablet   gemfibrozil (LOPID) 600 MG tablet   PAD (peripheral artery disease) (HCC)    May need referral back to  [facility]. He'd prefer to wait for now.      Relevant Medications   labetalol (NORMODYNE) 200 MG tablet   gemfibrozil (LOPID) 600 MG tablet   DDD (degenerative disc disease), lumbar    Trial of diclofenac gel.      Relevant Medications   diclofenac sodium (VOLTAREN) 1 % GEL    Other Visit Diagnoses    Controlled type 2 diabetes mellitus without complication, without long-term current use of insulin (HCC)       Relevant Medications   metFORMIN (GLUCOPHAGE) 500 MG tablet   glipiZIDE (GLUCOTROL) 10 MG tablet       Return for re-evaluation as planned, 08/31/16.   Fernande Bras, PA-C Primary Care at [facility]

## 2016-07-01 NOTE — Assessment & Plan Note (Signed)
Stressed the importance of good control, given his cardiovascular risk and known PVD.

## 2016-08-31 ENCOUNTER — Ambulatory Visit (INDEPENDENT_AMBULATORY_CARE_PROVIDER_SITE_OTHER): Payer: PRIVATE HEALTH INSURANCE | Admitting: Physician Assistant

## 2016-08-31 ENCOUNTER — Encounter: Payer: Self-pay | Admitting: Family Medicine

## 2016-08-31 VITALS — BP 128/74 | HR 64 | Temp 97.9°F | Resp 18 | Ht 72.05 in | Wt 186.0 lb

## 2016-08-31 DIAGNOSIS — I10 Essential (primary) hypertension: Secondary | ICD-10-CM | POA: Diagnosis not present

## 2016-08-31 DIAGNOSIS — E119 Type 2 diabetes mellitus without complications: Secondary | ICD-10-CM | POA: Diagnosis not present

## 2016-08-31 DIAGNOSIS — Z1211 Encounter for screening for malignant neoplasm of colon: Secondary | ICD-10-CM | POA: Diagnosis not present

## 2016-08-31 DIAGNOSIS — Z0000 Encounter for general adult medical examination without abnormal findings: Secondary | ICD-10-CM | POA: Diagnosis not present

## 2016-08-31 DIAGNOSIS — R002 Palpitations: Secondary | ICD-10-CM | POA: Diagnosis not present

## 2016-08-31 DIAGNOSIS — E785 Hyperlipidemia, unspecified: Secondary | ICD-10-CM | POA: Diagnosis not present

## 2016-08-31 DIAGNOSIS — E269 Hyperaldosteronism, unspecified: Secondary | ICD-10-CM

## 2016-08-31 MED ORDER — LABETALOL HCL 200 MG PO TABS: 200.0000 mg | ORAL_TABLET | Freq: Two times a day (BID) | ORAL | 1 refills | Status: DC

## 2016-08-31 MED ORDER — SPIRONOLACTONE 50 MG PO TABS: 50.0000 mg | ORAL_TABLET | Freq: Every day | ORAL | 1 refills | Status: DC

## 2016-08-31 MED ORDER — LABETALOL HCL 200 MG PO TABS
200.0000 mg | ORAL_TABLET | Freq: Two times a day (BID) | ORAL | 1 refills | Status: DC
Start: 2016-08-31 — End: 2016-12-07

## 2016-08-31 MED ORDER — LISINOPRIL 40 MG PO TABS: 40.0000 mg | ORAL_TABLET | Freq: Every day | ORAL | 1 refills | Status: DC

## 2016-08-31 MED ORDER — HYDRALAZINE HCL 100 MG PO TABS: ORAL_TABLET | ORAL | 1 refills | Status: DC

## 2016-08-31 MED ORDER — METOPROLOL SUCCINATE ER 50 MG PO TB24: 50.0000 mg | ORAL_TABLET | Freq: Every day | ORAL | 1 refills | Status: DC

## 2016-08-31 NOTE — Progress Notes (Signed)
Subjective:    Patient ID: Kyle [name], male    DOB: Jun 18, 1955, 61 y.o.   MRN: [id number]  08/31/2016  Diabetes (follow-up ) and Hypertension   HPI This 61 y.o. male presents for evaluation of diabetes and hypertension.  BP Readings from Last 3 Encounters:  08/31/16 128/74  06/29/16 (!) 182/83  06/03/16 (!) 200/92   Wt Readings from Last 3 Encounters:  08/31/16 186 lb (84.4 kg)  06/29/16 186 lb (84.4 kg)  06/01/16 183 lb 9.6 oz (83.3 kg)   Immunization History  Administered Date(s) Administered  . Tdap 08/26/2015      Review of Systems  Constitutional: Negative for activity change, appetite change, chills, diaphoresis, fatigue and fever.  Respiratory: Negative for cough and shortness of breath.   Cardiovascular: Negative for chest pain, palpitations and leg swelling.  Gastrointestinal: Negative for abdominal pain, diarrhea, nausea and vomiting.  Endocrine: Negative for cold intolerance, heat intolerance, polydipsia, polyphagia and polyuria.  Skin: Negative for color change, rash and wound.  Neurological: Negative for dizziness, tremors, seizures, syncope, facial asymmetry, speech difficulty, weakness, light-headedness, numbness and headaches.  Psychiatric/Behavioral: Negative for dysphoric mood and sleep disturbance. The patient is not nervous/anxious.     Past Medical History:  Diagnosis Date  . Diabetes mellitus without complication (HCC)   . History of basal cell carcinoma of skin    shoulder  . HLD (hyperlipidemia)   . Hypertension   . Prediabetes    Past Surgical History:  Procedure Laterality Date  . SPINE SURGERY  1994  . TONSILLECTOMY  1961   Allergies  Allergen Reactions  . Statins     Leg cramps   Current Outpatient Prescriptions  Medication Sig Dispense Refill  . aspirin 81 MG tablet Take 81 mg by mouth daily.    . diclofenac sodium (VOLTAREN) 1 % GEL Apply 2 g topically 4 (four) times daily as needed. 100 g 1  . gemfibrozil (LOPID) 600  MG tablet Take 1 tablet (600 mg total) by mouth 2 (two) times daily before a meal. 180 tablet 3  . glipiZIDE (GLUCOTROL) 10 MG tablet TAKE 1 TABLET TWICE A DAY BEFORE MEALS 180 tablet 3  . glucose blood test strip Use as instructed 100 each 12  . hydrALAZINE (APRESOLINE) 100 MG tablet TAKE 1 TABLET THREE TIMES A DAY 270 tablet 0  . labetalol (NORMODYNE) 200 MG tablet Take 1 tablet (200 mg total) by mouth 2 (two) times daily. 60 tablet 0  . Lancets 30G MISC Use to check home glucose daily and as needed 100 each prn  . lisinopril (PRINIVIL,ZESTRIL) 40 MG tablet Take 1 tablet (40 mg total) by mouth daily. 90 tablet 0  . metFORMIN (GLUCOPHAGE) 500 MG tablet Take 2 tablets (1,000 mg total) by mouth 2 (two) times daily with a meal. 360 tablet 3  . spironolactone (ALDACTONE) 50 MG tablet Take 1 tablet (50 mg total) by mouth daily. 90 tablet 0   No current facility-administered medications for this visit.    Social History   Social History  . Marital status: Married    Spouse name: Dennie Bibleat  . Number of children: 2  . Years of education: GED   Occupational History  . retired 07/2015     former truck Hospital doctordriver, Museum/gallery exhibitions officerforklift driver  . 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 Smoking status: Former Smoker  Types: Cigarettes    Start date: 11/05/2007  . Smokeless tobacco: Never Used  . Alcohol use No  . Drug use: No  . Sexual activity: Yes    Partners: Female    Birth control/ protection: None   Other Topics Concern  . Not on file   Social History Narrative   Lives with his wife and their pets.   Daughters live in [place] and [place].   Son lives in [place].   Enjoying doing work around the house and yard.   Is thinking about part time work at some time.   Family History  Problem Relation Age of Onset  . Diabetes Father   . Hypertension Father   . Hypertension Mother   . Hypertension Sister    . Diabetes Sister        diet controlled       Objective:    BP 128/74   Pulse 64   Temp 97.9 F (36.6 C) (Oral)   Resp 18   Ht 6' 0.05" (1.83 m)   Wt 186 lb (84.4 kg)   SpO2 97%   BMI 25.19 kg/m  Physical Exam  Constitutional: He is oriented to person, place, and time. He appears well-developed and well-nourished. No distress.  HENT:  Head: Normocephalic and atraumatic.  Right Ear: External ear normal.  Left Ear: External ear normal.  Nose: Nose normal.  Mouth/Throat: Oropharynx is clear and moist.  Eyes: Pupils are equal, round, and reactive to light. Conjunctivae and EOM are normal.  Neck: Normal range of motion. Neck supple. Carotid bruit is not present. No thyromegaly present.  Cardiovascular: Normal rate, regular rhythm, normal heart sounds and intact distal pulses.  Exam reveals no gallop and no friction rub.   No murmur heard. Pulmonary/Chest: Effort normal and breath sounds normal. He has no wheezes. He has no rales.  Abdominal: Soft. Bowel sounds are normal. He exhibits no distension and no mass. There is no tenderness. There is no rebound and no guarding.  Lymphadenopathy:    He has no cervical adenopathy.  Neurological: He is alert and oriented to person, place, and time. No cranial nerve deficit.  Skin: Skin is warm and dry. No rash noted. He is not diaphoretic.  Psychiatric: He has a normal mood and affect. His behavior is normal.  Nursing note and vitals reviewed.  Results for orders placed or performed during the hospital encounter of 06/03/16  Comprehensive metabolic panel  Result Value Ref Range   Sodium 140 135 - 145 mmol/L   Potassium 3.5 3.5 - 5.1 mmol/L   Chloride 107 101 - 111 mmol/L   CO2 23 22 - 32 mmol/L   Glucose, Bld 168 (H) 65 - 99 mg/dL   BUN 15 6 - 20 mg/dL   Creatinine, Ser 1.30 0.61 - 1.24 mg/dL   Calcium 9.6 8.9 - 86.5 mg/dL   Total Protein 7.3 6.5 - 8.1 g/dL   Albumin 4.3 3.5 - 5.0 g/dL   AST 25 15 - 41 U/L   ALT 27 17 - 63 U/L     Alkaline Phosphatase 51 38 - 126 U/L   Total Bilirubin 0.9 0.3 - 1.2 mg/dL   GFR calc non Af Amer >60 >60 mL/min   GFR calc Af Amer >60 >60 mL/min   Anion gap 10 5 - 15  CBC  Result Value Ref Range   WBC 9.1 4.0 - 10.5 K/uL   RBC 4.39 4.22 - 5.81 MIL/uL   Hemoglobin 13.3  13.0 - 17.0 g/dL   HCT 40.9 (L) 81.1 - 91.4 %   MCV 87.2 78.0 - 100.0 fL   MCH 30.3 26.0 - 34.0 pg   MCHC 34.7 30.0 - 36.0 g/dL   RDW 78.2 95.6 - 21.3 %   Platelets 270 150 - 400 K/uL  Troponin I  Result Value Ref Range   Troponin I <0.03 <0.03 ng/mL       Assessment & Plan:  No diagnosis found.  No orders of the defined types were placed in this encounter.  No orders of the defined types were placed in this encounter.   No Follow-up on file.   Carlette Palmatier Paulita Fujita, M.D. Primary Care at [facility] [address] [place], [place]  [phone] phone [phone] fax

## 2016-08-31 NOTE — Progress Notes (Signed)
Patient ID: Kyle [name], male    DOB: December 06, 1955, 61 y.o.   MRN: [id number]  PCP: Porfirio Oar, PA-C  Chief Complaint  Patient presents with  . Diabetes    follow-up   . Hypertension    Subjective:   Presents for evaluation of diabetes and HTN. He is accompanied by his wife.  Fasting glucose in AM 160-170. Wide variation of home BP readings. 108-180/68-102. Nephrology evaluation recently. He has been referred for a 24-hour ambulatory BP monitor, scheduled to begin tomorrow.  He feels well, denying CP, SOB, HA, dizziness, nausea, vomiting, diarrhea. Continues to tolerate his medications. Is in the process of scheduling an eye exam. He is making healthier eating choices, managed by his wife. Walks for exercise 3-4 times/week.  Several tick bites over te past several weeks. 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 Previous work up included a 30-day Holter monitor, which was reportedly normal.     Review of Systems  Constitutional: Negative for activity change, appetite change, fatigue and unexpected weight change.  HENT: Negative for congestion, dental problem, ear pain, hearing loss, mouth sores, postnasal drip, rhinorrhea, sneezing, sore throat, tinnitus and trouble swallowing.   Eyes: Negative for photophobia, pain, redness and visual disturbance.  Respiratory: Negative for cough, chest tightness and shortness of breath.   Cardiovascular: Positive for palpitations. Negative for chest pain and leg swelling.  Gastrointestinal: Negative for abdominal pain, blood in stool, constipation, diarrhea, nausea and vomiting.  Endocrine: Negative for cold intolerance, heat intolerance, polydipsia, polyphagia and polyuria.  Genitourinary: Negative for dysuria, frequency, hematuria and urgency.  Musculoskeletal: Negative for arthralgias, gait problem, myalgias and neck stiffness.  Skin:  Positive for wound (tick bite, abdomen). Negative for rash.  Neurological: Negative for dizziness, speech difficulty, weakness, light-headedness, numbness and headaches.  Hematological: Negative for adenopathy.  Psychiatric/Behavioral: Negative for confusion and sleep disturbance. The patient is not nervous/anxious.        Patient Active Problem List   Diagnosis Date Noted  . Palpitations 08/31/2016  . DDD (degenerative disc disease), lumbar 06/29/2016  . 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 Arthritis, hip 01/26/2014  . PAD (peripheral artery disease) (HCC) 04/04/2012  . Cardiovascular risk factor 04/04/2012  . Hypertension 03/17/2012  . Diabetes (HCC) 03/17/2012  . Hyperlipidemia 03/17/2012  . 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 Authorizing Provider  metoprolol succinate (TOPROL-XL) 50 MG 24 hr tablet Take 50 mg by mouth daily. Take with or immediately following a meal.   Yes [provider]  aspirin 81 MG tablet Take 81 mg by mouth daily.    [provider]  gemfibrozil (LOPID) 600 MG tablet Take 1 tablet (600 mg total) by mouth 2 (two) times daily before a meal. 06/29/16   Asja Frommer, PA-C  glipiZIDE (GLUCOTROL) 10 MG tablet TAKE 1 TABLET TWICE A DAY BEFORE MEALS 06/29/16   Porfirio Oar, PA-C  glucose blood test strip Use as instructed 12/30/15   Porfirio Oar, PA-C  hydrALAZINE (APRESOLINE) 100 MG tablet TAKE 1 TABLET THREE TIMES A DAY 06/05/16   Meigan Pates, PA-C  labetalol (NORMODYNE) 200 MG tablet Take 1 tablet (200 mg total) by mouth 2 (two) times daily. 06/29/16   Porfirio Oar, PA-C  Lancets 30G MISC Use to check home glucose daily and as needed 12/30/15   Porfirio Oar, PA-C  lisinopril (  PRINIVIL,ZESTRIL) 40 MG tablet Take 1 tablet (40 mg total) by mouth daily. 06/05/16   Porfirio Oar, PA-C  metFORMIN (GLUCOPHAGE) 500 MG tablet Take 2 tablets (1,000 mg total) by mouth 2  (two) times daily with a meal. 06/29/16   Tyaire Odem, PA-C  spironolactone (ALDACTONE) 50 MG tablet Take 1 tablet (50 mg total) by mouth daily. 06/05/16   Porfirio Oar, PA-C     Allergies  Allergen Reactions  . Statins     Leg cramps       Objective:  Physical Exam  Constitutional: He is oriented to person, place, and time. He appears well-developed and well-nourished. He is active and cooperative. No distress.  BP 128/74   Pulse 64   Temp 97.9 F (36.6 C) (Oral)   Resp 18   Ht 6' 0.05" (1.83 m)   Wt 186 lb (84.4 kg)   SpO2 97%   BMI 25.19 kg/m   HENT:  Head: Normocephalic and atraumatic.  Right Ear: Hearing normal.  Left Ear: Hearing normal.  Eyes: Conjunctivae are normal. No scleral icterus.  Neck: Normal range of motion. Neck supple. No thyromegaly present.  Cardiovascular: Normal rate, regular rhythm and normal heart sounds.   Pulses:      Radial pulses are 2+ on the right side, and 2+ on the left side.  Pulmonary/Chest: Effort normal and breath sounds normal.  Lymphadenopathy:       Head (right side): No tonsillar, no preauricular, no posterior auricular and no occipital adenopathy present.       Head (left side): No tonsillar, no preauricular, no posterior auricular and no occipital adenopathy present.    He has no cervical adenopathy.       Right: No supraclavicular adenopathy present.       Left: No supraclavicular adenopathy present.  Neurological: He is alert and oriented to person, place, and time. No sensory deficit.  Skin: Skin is warm, dry and intact. No rash noted. Lesion: 1-2 mm excoriated lesion on the abdomen, consistent with reported tick bite. No cellulitis.  No cyanosis or erythema. Nails show no clubbing.  Psychiatric: He has a normal mood and affect. His speech is normal and behavior is normal.           Assessment & Plan:   Problem List Items Addressed This Visit    Diabetes (HCC) - Primary (Chronic)    Await lab results. May need  bedtime snack to reduce AM elevations.      Relevant Medications   lisinopril (PRINIVIL,ZESTRIL) 40 MG tablet   Other Relevant Orders   Comprehensive metabolic panel   Hemoglobin A1c   Hypertension    Controlled here today. Continue current treatment. Proceed with ambulatory BP monitor tomorrow, as planned.      Relevant Medications   spironolactone (ALDACTONE) 50 MG tablet   metoprolol succinate (TOPROL-XL) 50 MG 24 hr tablet   lisinopril (PRINIVIL,ZESTRIL) 40 MG tablet   hydrALAZINE (APRESOLINE) 100 MG tablet   labetalol (NORMODYNE) 200 MG tablet   Other Relevant Orders   Comprehensive metabolic panel   CBC with Differential/Platelet   Hyperlipidemia    Await labs. Adjust regimen as indicated by results.       Relevant Medications   spironolactone (ALDACTONE) 50 MG tablet   metoprolol succinate (TOPROL-XL) 50 MG 24 hr tablet   lisinopril (PRINIVIL,ZESTRIL) 40 MG tablet   hydrALAZINE (APRESOLINE) 100 MG tablet   labetalol (NORMODYNE) 200 MG tablet   Other Relevant Orders   Comprehensive metabolic panel  Lipid panel   Aldosteronism [facility](HCC)    Follow-up with Dr. Hyman HopesWebb next month, as planned.      Palpitations    Monitor for worsening symptoms. If needs additional BP reduction and continues to have palpitations, would consider increasing metoprolol dose,+/- re-evaluation with cardiology.      Relevant Medications   metoprolol succinate (TOPROL-XL) 50 MG 24 hr tablet    Other Visit Diagnoses    Screening for colon cancer       Relevant Orders   Cologuard   Healthcare maintenance       Declines Pneumovax 23 today. Doesn't think he needs it. Agrees to [employer]. Will schedule eye exam.       Return in about 3 months (around 12/01/2016) for blood pressure, cholesterol, and diabetes follow up.   Fernande Brashelle S. Haron Beilke, PA-C [facility]

## 2016-08-31 NOTE — Assessment & Plan Note (Signed)
Follow-up with Dr. Hyman HopesWebb next month, as planned.

## 2016-08-31 NOTE — Assessment & Plan Note (Signed)
Controlled here today. Continue current treatment. Proceed with ambulatory BP monitor tomorrow, as planned.

## 2016-08-31 NOTE — Assessment & Plan Note (Signed)
Await labs. Adjust regimen as indicated by results.  

## 2016-08-31 NOTE — Assessment & Plan Note (Signed)
Monitor for worsening symptoms. If needs additional BP reduction and continues to have palpitations, would consider increasing metoprolol dose,+/- re-evaluation with cardiology.

## 2016-08-31 NOTE — Patient Instructions (Signed)
     IF you received an x-ray today, you will receive an invoice from [facility]. Please contact [facility] at [phone] with questions or concerns regarding your invoice.   IF you received labwork today, you will receive an invoice from [facility]. Please contact [facility] at [phone] with questions or concerns regarding your invoice.   Our billing staff will not be able to assist you with questions regarding bills from these companies.  You will be contacted with the lab results as soon as they are available. The fastest way to get your results is to activate your My Chart account. Instructions are located on the last page of this paperwork. If you have not heard from us regarding the results in 2 weeks, please contact this office.     

## 2016-08-31 NOTE — Progress Notes (Signed)
Subjective:    Patient ID: Kyle [name], male    DOB: November 19, 1955, 61 y.o.   MRN: [id number] PCP: Porfirio Oar, PA-C   HPI Yamato Kopf is a 61 year old white male presenting for hypertension, hyperlipidemia and diabetes follow up. He is accompanied by his wife, Elease Hashimoto.  Feeling well overall. No concerns. Denies nausea, vomiting, chest pain, polyuria, polydipsia, dizziness, headaches, syncope, vision changes. Current diabetic medications include metformin and glipizide.  Last eye exam one year ago. Will make an appointment soon. Saw dietitian once previously. Does not feel the need to return with the support of his wife. Current diet: Cuts down portions. Potatoes, burgers, fruit smoothies, etc. Wife monitors diet. Current exercise: Walks at work 3-4 days a week.  Current monitoring regimen: every day before breakfast and dinner, as well as 2 hours after dinner. Morning readings range 160-170. Before supper is 100-120. After dinner ranges 120-180.  He is bitten by ticks frequently when he is out working in his yard and is concerned about Lyme disease. He was bitten by one a few months ago that left a red mark on his stomach. He read Lyme disease can cause palpitations, which he has had for 10+ years. Normal 30-day holter monitor 10 years ago.  Referred to [facility] by Dr. Hyman Hopes for "24-hour BP monitoring." Appointment tomorrow. Takes home BP every morning. Ranges 108-180/68-102.    Patient Active Problem List   Diagnosis Date Noted  . DDD (degenerative disc disease), lumbar 06/29/2016  . 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 Arthritis, hip 01/26/2014  . PAD (peripheral artery disease) (HCC) 04/04/2012  . Cardiovascular risk factor 04/04/2012  . Hypertension 03/17/2012  . Diabetes (HCC) 03/17/2012  . Hyperlipidemia 03/17/2012  . 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 Authorizing Provider    metoprolol succinate (TOPROL-XL) 50 MG 24 hr tablet Take 50 mg by mouth daily. Take with or immediately following a meal.   Yes [provider]  aspirin 81 MG tablet Take 81 mg by mouth daily.    [provider]  gemfibrozil (LOPID) 600 MG tablet Take 1 tablet (600 mg total) by mouth 2 (two) times daily before a meal. 06/29/16   Jeffery, Chelle, PA-C  glipiZIDE (GLUCOTROL) 10 MG tablet TAKE 1 TABLET TWICE A DAY BEFORE MEALS 06/29/16   Porfirio Oar, PA-C  glucose blood test strip Use as instructed 12/30/15   Porfirio Oar, PA-C  hydrALAZINE (APRESOLINE) 100 MG tablet TAKE 1 TABLET THREE TIMES A DAY 06/05/16   Jeffery, Chelle, PA-C  labetalol (NORMODYNE) 200 MG tablet Take 1 tablet (200 mg total) by mouth 2 (two) times daily. 06/29/16   Porfirio Oar, PA-C  Lancets 30G MISC Use to check home glucose daily and as needed 12/30/15   Porfirio Oar, PA-C  lisinopril (PRINIVIL,ZESTRIL) 40 MG tablet Take 1 tablet (40 mg total) by mouth daily. 06/05/16   Porfirio Oar, PA-C  metFORMIN (GLUCOPHAGE) 500 MG tablet Take 2 tablets (1,000 mg total) by mouth 2 (two) times daily with a meal. 06/29/16   Jeffery, Chelle, PA-C  spironolactone (ALDACTONE) 50 MG tablet Take 1 tablet (50 mg total) by mouth daily. 06/05/16   Porfirio Oar, PA-C     Allergies  Allergen Reactions  . Statins     Leg cramps    Review of Systems See above.    Objective:   Physical Exam  Constitutional: He appears well-developed and well-nourished.  BP 128/74   Pulse 64  Temp 97.9 F (36.6 C) (Oral)   Resp 18   Ht 6' 0.05" (1.83 m)   Wt 186 lb (84.4 kg)   SpO2 97%   BMI 25.19 kg/m    HENT:  Head: Normocephalic and atraumatic.  Right Ear: External ear normal.  Left Ear: External ear normal.  Nose: Nose normal.  Eyes: EOM are normal.  Neck: Normal range of motion. Neck supple.  Cardiovascular: Normal rate, regular rhythm and normal heart sounds.   Pulses:      Radial pulses are 2+ on the right  side, and 2+ on the left side.       Dorsalis pedis pulses are 2+ on the right side, and 2+ on the left side.       Posterior tibial pulses are 2+ on the right side, and 2+ on the left side.  Pulmonary/Chest: Effort normal and breath sounds normal.  Lymphadenopathy:    He has no cervical adenopathy.  Neurological: He is alert.  Skin: Skin is warm and dry.  Single erythematous lesion <561mm on the abdomen inferior to umbilicus. No tick visible.  Psychiatric: He has a normal mood and affect. His behavior is normal.   Wt Readings from Last 3 Encounters:  08/31/16 186 lb (84.4 kg)  06/29/16 186 lb (84.4 kg)  06/01/16 183 lb 9.6 oz (83.3 kg)   BP Readings from Last 3 Encounters:  08/31/16 128/74  06/29/16 (!) 182/83  06/03/16 (!) 200/92      Assessment & Plan:   1. Type 2 diabetes mellitus without complication, without long-term current use of insulin (HCC) Anticipate possible elevation in HgbA1c given morning blood sugar readings. Adjust regimen as indicated. Discussed phone apps available for ease of tracking home blood sugar readings. Follow up in 3 months. - Comprehensive metabolic panel - Hemoglobin A1c  2. Essential hypertension Normotensive in office today. Continue current regimen. Labs pending.  - Comprehensive metabolic panel - CBC with Differential/Platelet  3. Hyperlipidemia, unspecified hyperlipidemia type Stable. Encouraged continued improvements in diet. Labs pending. Continue current regimen and adjust as indicated. - Comprehensive metabolic panel - Lipid panel  4. Palpitations Stable. Discussed warning signs/symptoms and instructed to proceed immediately to the ED if they arise.    Respectfully, Allie DimmerLorae Gustabo Gordillo, PA-S

## 2016-08-31 NOTE — Assessment & Plan Note (Signed)
Await lab results. May need bedtime snack to reduce AM elevations.

## 2016-09-01 LAB — COMPREHENSIVE METABOLIC PANEL
A/G RATIO: 1.8 (ref 1.2–2.2)
ALT: 26 IU/L (ref 0–44)
AST: 17 IU/L (ref 0–40)
Albumin: 5.1 g/dL — ABNORMAL HIGH (ref 3.6–4.8)
Alkaline Phosphatase: 67 IU/L (ref 39–117)
BUN/Creatinine Ratio: 26 — ABNORMAL HIGH (ref 10–24)
BUN: 24 mg/dL (ref 8–27)
Bilirubin Total: 0.3 mg/dL (ref 0.0–1.2)
CALCIUM: 10.7 mg/dL — AB (ref 8.6–10.2)
CO2: 20 mmol/L (ref 20–29)
CREATININE: 0.92 mg/dL (ref 0.76–1.27)
Chloride: 105 mmol/L (ref 96–106)
GFR, EST AFRICAN AMERICAN: 102 mL/min/{1.73_m2} (ref >59)
GFR, EST NON AFRICAN AMERICAN: 88 mL/min/{1.73_m2} (ref >59)
GLOBULIN, TOTAL: 2.9 g/dL (ref 1.5–4.5)
Glucose: 153 mg/dL — ABNORMAL HIGH (ref 65–99)
POTASSIUM: 4.5 mmol/L (ref 3.5–5.2)
Sodium: 141 mmol/L (ref 134–144)
TOTAL PROTEIN: 8 g/dL (ref 6.0–8.5)

## 2016-09-01 LAB — CBC WITH DIFFERENTIAL/PLATELET
BASOS: 0 %
Basophils Absolute: 0 10*3/uL (ref 0.0–0.2)
EOS (ABSOLUTE): 0.5 10*3/uL — AB (ref 0.0–0.4)
EOS: 5 %
HEMATOCRIT: 41.7 % (ref 37.5–51.0)
HEMOGLOBIN: 14.7 g/dL (ref 13.0–17.7)
IMMATURE GRANS (ABS): 0 10*3/uL (ref 0.0–0.1)
Immature Granulocytes: 0 %
LYMPHS: 27 %
Lymphocytes Absolute: 2.6 10*3/uL (ref 0.7–3.1)
MCH: 31 pg (ref 26.6–33.0)
MCHC: 35.3 g/dL (ref 31.5–35.7)
MCV: 88 fL (ref 79–97)
Monocytes Absolute: 0.7 10*3/uL (ref 0.1–0.9)
Monocytes: 7 %
NEUTROS ABS: 5.9 10*3/uL (ref 1.4–7.0)
Neutrophils: 61 %
Platelets: 318 10*3/uL (ref 150–379)
RBC: 4.74 x10E6/uL (ref 4.14–5.80)
RDW: 13.4 % (ref 12.3–15.4)
WBC: 9.7 10*3/uL (ref 3.4–10.8)

## 2016-09-01 LAB — LIPID PANEL
CHOLESTEROL TOTAL: 226 mg/dL — AB (ref 100–199)
Chol/HDL Ratio: 6.6 ratio — ABNORMAL HIGH (ref 0.0–5.0)
HDL: 34 mg/dL — AB (ref >39)
LDL CALC: 120 mg/dL — AB (ref 0–99)
TRIGLYCERIDES: 358 mg/dL — AB (ref 0–149)
VLDL CHOLESTEROL CAL: 72 mg/dL — AB (ref 5–40)

## 2016-09-01 LAB — HEMOGLOBIN A1C
Est. average glucose Bld gHb Est-mCnc: 128 mg/dL
Hgb A1c MFr Bld: 6.1 % — ABNORMAL HIGH (ref 4.8–5.6)

## 2016-09-03 ENCOUNTER — Encounter: Payer: Self-pay | Admitting: Physician Assistant

## 2016-09-04 MED ORDER — EZETIMIBE 10 MG PO TABS: 10.0000 mg | ORAL_TABLET | Freq: Every day | ORAL | 3 refills | Status: DC

## 2016-09-22 ENCOUNTER — Encounter: Payer: Self-pay | Admitting: Physician Assistant

## 2016-09-23 NOTE — Telephone Encounter (Signed)
This patient has not yet received the Cologuard by mail. Please check into that.  Warmly, Damany Eastman

## 2016-09-28 ENCOUNTER — Other Ambulatory Visit: Payer: Self-pay

## 2016-09-28 MED ORDER — EZETIMIBE 10 MG PO TABS: 10.0000 mg | ORAL_TABLET | Freq: Every day | ORAL | 3 refills | Status: DC

## 2016-10-06 ENCOUNTER — Encounter: Payer: Self-pay | Admitting: Physician Assistant

## 2016-10-18 ENCOUNTER — Encounter: Payer: Self-pay | Admitting: Physician Assistant

## 2016-11-08 ENCOUNTER — Other Ambulatory Visit: Payer: Self-pay | Admitting: Physician Assistant

## 2016-11-08 DIAGNOSIS — Z1211 Encounter for screening for malignant neoplasm of colon: Secondary | ICD-10-CM

## 2016-11-08 NOTE — Progress Notes (Unsigned)
Orders Placed This Encounter  Procedures  . Cologuard    Standing Status:   Future    Standing Expiration Date:   11/08/2017

## 2016-11-09 LAB — COLOGUARD: COLOGUARD: NEGATIVE

## 2016-11-09 NOTE — Addendum Note (Signed)
Addended by: Baldwin Crown D on: 11/09/2016 08:08 AM   Modules accepted: Orders

## 2016-12-07 ENCOUNTER — Encounter: Payer: Self-pay | Admitting: Physician Assistant

## 2016-12-07 ENCOUNTER — Ambulatory Visit (INDEPENDENT_AMBULATORY_CARE_PROVIDER_SITE_OTHER): Payer: PRIVATE HEALTH INSURANCE | Admitting: Physician Assistant

## 2016-12-07 VITALS — BP 132/80 | HR 81 | Temp 97.9°F | Resp 18 | Ht 72.0 in | Wt 189.6 lb

## 2016-12-07 DIAGNOSIS — E119 Type 2 diabetes mellitus without complications: Secondary | ICD-10-CM

## 2016-12-07 DIAGNOSIS — I700 Atherosclerosis of aorta: Secondary | ICD-10-CM | POA: Diagnosis not present

## 2016-12-07 DIAGNOSIS — M5136 Other intervertebral disc degeneration, lumbar region: Secondary | ICD-10-CM | POA: Diagnosis not present

## 2016-12-07 DIAGNOSIS — E785 Hyperlipidemia, unspecified: Secondary | ICD-10-CM | POA: Diagnosis not present

## 2016-12-07 DIAGNOSIS — R002 Palpitations: Secondary | ICD-10-CM

## 2016-12-07 DIAGNOSIS — Z23 Encounter for immunization: Secondary | ICD-10-CM | POA: Diagnosis not present

## 2016-12-07 DIAGNOSIS — K8020 Calculus of gallbladder without cholecystitis without obstruction: Secondary | ICD-10-CM

## 2016-12-07 DIAGNOSIS — I10 Essential (primary) hypertension: Secondary | ICD-10-CM | POA: Diagnosis not present

## 2016-12-07 MED ORDER — GLIPIZIDE 10 MG PO TABS: ORAL_TABLET | ORAL | 3 refills | Status: DC

## 2016-12-07 MED ORDER — HYDROCODONE-ACETAMINOPHEN 5-325 MG PO TABS: 1.0000 | ORAL_TABLET | Freq: Every evening | ORAL | 0 refills | Status: DC | PRN

## 2016-12-07 MED ORDER — METFORMIN HCL 500 MG PO TABS: 1000.0000 mg | ORAL_TABLET | Freq: Two times a day (BID) | ORAL | 3 refills | Status: AC

## 2016-12-07 MED ORDER — GABAPENTIN 300 MG PO CAPS: 300.0000 mg | ORAL_CAPSULE | Freq: Three times a day (TID) | ORAL | 3 refills | Status: DC

## 2016-12-07 MED ORDER — LABETALOL HCL 200 MG PO TABS: 200.0000 mg | ORAL_TABLET | Freq: Two times a day (BID) | ORAL | 1 refills | Status: DC

## 2016-12-07 MED ORDER — SPIRONOLACTONE 50 MG PO TABS: 50.0000 mg | ORAL_TABLET | Freq: Every day | ORAL | 1 refills | Status: DC

## 2016-12-07 MED ORDER — HYDRALAZINE HCL 100 MG PO TABS: ORAL_TABLET | ORAL | 1 refills | Status: DC

## 2016-12-07 MED ORDER — METOPROLOL SUCCINATE ER 50 MG PO TB24: 50.0000 mg | ORAL_TABLET | Freq: Every day | ORAL | 1 refills | Status: DC

## 2016-12-07 MED ORDER — LISINOPRIL 40 MG PO TABS: 40.0000 mg | ORAL_TABLET | Freq: Every day | ORAL | 1 refills | Status: DC

## 2016-12-07 NOTE — Assessment & Plan Note (Signed)
Triggered by ibuprofen, but otherwise controlled with metoprolol. Needs to avoid NSAIDS.

## 2016-12-07 NOTE — Assessment & Plan Note (Signed)
Worsening pain, with radiculopathy to the RIGHT leg. Avoid NSAIDS, due to aldosteronism. Trial of gabapentin. Hydrocodone for PRN use at HS. Refer to orthopedics for consideration of other treatment options.

## 2016-12-07 NOTE — Assessment & Plan Note (Signed)
Well controlled x 6 months. Continue healthy lifestyle changes and current medications.

## 2016-12-07 NOTE — Assessment & Plan Note (Signed)
Incidental finding on lumbar radiographs 05/2016. Continue efforts to control HTN, diabetes and lipids.

## 2016-12-07 NOTE — Assessment & Plan Note (Signed)
Likely remains uncontrolled, as he has not been taking the Zetia, due to cost. Await lab results. He is intolerant to statins, and is on gemfibrozil.

## 2016-12-07 NOTE — Assessment & Plan Note (Signed)
Well controlled. Continue efforts for healthy lifestyle changes and current medications.

## 2016-12-07 NOTE — Patient Instructions (Addendum)
When starting the gabapentin, you may find that it makes you sleepy. As such, start by taking 1 at bedtime. After 3-4 days, add a dose in the morning. After 3-4 more days, add a dose midday. If it makes you too sleepy to take during the day, you can INCREASE the bedtime dose from 1 to 2 to 3 tablets. Let's see how that works over the next 4 weeks-let me know how it's going.    IF you received an x-ray today, you will receive an invoice from [facility]. Please contact [facility] at [phone] with questions or concerns regarding your invoice.   IF you received labwork today, you will receive an invoice from [facility]. Please contact [facility] at [phone] with questions or concerns regarding your invoice.   Our billing staff will not be able to assist you with questions regarding bills from these companies.  You will be contacted with the lab results as soon as they are available. The fastest way to get your results is to activate your My Chart account. Instructions are located on the last page of this paperwork. If you have not heard from us regarding the results in 2 weeks, please contact this office.

## 2016-12-07 NOTE — Progress Notes (Signed)
Patient ID: Kyle [name], male    DOB: [birth year], 61 y.o.   MRN: [id number]  PCP: Harrison Mons, PA-C  Chief Complaint  Patient presents with  . Hypertension  . Diabetes  . Hyperlipidemia  . Follow-up    Pt refused flu and pneumonia shot    Subjective:   Presents for evaluation of diabetes, HTN and hyperlipidemia. He is accompanied by his wife, as usual.  He declines influenza and pneumococcal vaccines.  Cholesterol has not yet met goals, LDL 120 3 month ago, but stable. TG >300. Diabetes has been well controlled, improving from >7% to 6.1 6 months ago. No problems with his regular medications.  Low back pain with radicular symptoms to the RIGHT leg. Recall that he has multilevel degenerative disc disease, plain radiographs were performed in April 2018. Ibuprofen causes heart palpitations. Ran out of "those green pills that worked so well." Indomethacin (but it raised his BP). His wife had some leftover hydrocodone from a dental procedure, and that worked until it ran out. He was able to sleep with 2 of the 5/325 mg tablets. When the pain is bad, he has difficulty moving the RIGHT foot and ankle.  His wife is doing more of the driving.  Wasn't able to get the Zetia, added based on his last labs, due to cost.   Review of Systems Denies chest pain, shortness of breath, HA, dizziness, vision change, nausea, vomiting, diarrhea, constipation, melena, hematochezia, dysuria, increased urinary urgency or frequency, increased hunger or thirst, unintentional weight change, unexplained myalgias or arthralgias, rash.      Patient Active Problem List   Diagnosis Date Noted  . Palpitations 08/31/2016  . DDD (degenerative disc disease), lumbar 06/29/2016  . 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 Arthritis, hip 01/26/2014  . PAD (peripheral artery disease) ([place]) 04/04/2012  . Cardiovascular risk factor 04/04/2012  . Hypertension 03/17/2012  . Diabetes ([place]) 03/17/2012  .  Hyperlipidemia 03/17/2012  . 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 Authorizing Provider  aspirin 81 MG tablet Take 81 mg by mouth daily.   Yes [provider]  gemfibrozil (LOPID) 600 MG tablet Take 1 tablet (600 mg total) by mouth 2 (two) times daily before a meal. 06/29/16  Yes Jules Baty, PA-C  glipiZIDE (GLUCOTROL) 10 MG tablet TAKE 1 TABLET TWICE A DAY BEFORE MEALS 06/29/16  Yes Marquinn Meschke, PA-C  glucose blood test strip Use as instructed 12/30/15  Yes Shiori Adcox, PA-C  hydrALAZINE (APRESOLINE) 100 MG tablet TAKE 1 TABLET THREE TIMES A DAY 08/31/16  Yes Jonay Hitchcock, PA-C  labetalol (NORMODYNE) 200 MG tablet Take 1 tablet (200 mg total) by mouth 2 (two) times daily. 08/31/16  Yes Harrison Mons, PA-C  Lancets 30G MISC Use to check home glucose daily and as needed 12/30/15  Yes Chester Sibert, PA-C  lisinopril (PRINIVIL,ZESTRIL) 40 MG tablet Take 1 tablet (40 mg total) by mouth daily. 08/31/16  Yes Lateesha Bezold, PA-C  metFORMIN (GLUCOPHAGE) 500 MG tablet Take 2 tablets (1,000 mg total) by mouth 2 (two) times daily with a meal. 06/29/16  Yes Keliyah Spillman, PA-C  metoprolol succinate (TOPROL-XL) 50 MG 24 hr tablet Take 1 tablet (50 mg total) by mouth daily. Take with or immediately following a meal. 08/31/16  Yes Kathleen Tamm, PA-C  spironolactone (ALDACTONE) 50 MG tablet Take 1 tablet (50 mg total) by mouth daily. 08/31/16  Yes Sueko Dimichele, PA-C  ezetimibe (ZETIA)  10 MG tablet Take 1 tablet (10 mg total) by mouth daily. Patient not taking: Reported on 12/07/2016 09/28/16   Harrison Mons, PA-C     Allergies  Allergen Reactions  . Statins     Leg cramps       Objective:  Physical Exam  Constitutional: He is oriented to person, place, and time. He appears well-developed and well-nourished. He is active and cooperative. No distress.  BP 132/80 (BP Location: Left Arm, Patient  Position: Sitting, Cuff Size: Normal)   Pulse 81   Temp 97.9 F (36.6 C) (Oral)   Resp 18   Ht 6' (1.829 m)   Wt 189 lb 9.6 oz (86 kg)   SpO2 97%   BMI 25.71 kg/m   HENT:  Head: Normocephalic and atraumatic.  Right Ear: Hearing normal.  Left Ear: Hearing normal.  Eyes: Conjunctivae are normal. No scleral icterus.  Neck: Normal range of motion. Neck supple. No thyromegaly present.  Cardiovascular: Normal rate, regular rhythm and normal heart sounds.   Pulses:      Radial pulses are 2+ on the right side, and 2+ on the left side.  Pulmonary/Chest: Effort normal and breath sounds normal.  Lymphadenopathy:       Head (right side): No tonsillar, no preauricular, no posterior auricular and no occipital adenopathy present.       Head (left side): No tonsillar, no preauricular, no posterior auricular and no occipital adenopathy present.    He has no cervical adenopathy.       Right: No supraclavicular adenopathy present.       Left: No supraclavicular adenopathy present.  Neurological: He is alert and oriented to person, place, and time. No sensory deficit.  Skin: Skin is warm, dry and intact. No rash noted. No cyanosis or erythema. Nails show no clubbing.  Psychiatric: He has a normal mood and affect. His speech is normal and behavior is normal.   Wt Readings from Last 3 Encounters:  12/07/16 189 lb 9.6 oz (86 kg)  08/31/16 186 lb (84.4 kg)  06/29/16 186 lb (84.4 kg)    Diabetic Foot Exam - Simple   Simple Foot Form Diabetic Foot exam was performed with the following findings:  Yes 12/07/2016  8:28 AM  Visual Inspection No deformities, no ulcerations, no other skin breakdown bilaterally:  Yes Sensation Testing Intact to touch and monofilament testing bilaterally:  Yes Pulse Check Posterior Tibialis and Dorsalis pulse intact bilaterally:  Yes Comments        Assessment & Plan:   Problem List Items Addressed This Visit    Diabetes ([place]) - Primary (Chronic)    Well  controlled x 6 months. Continue healthy lifestyle changes and current medications.      Relevant Medications   metFORMIN (GLUCOPHAGE) 500 MG tablet   lisinopril (PRINIVIL,ZESTRIL) 40 MG tablet   glipiZIDE (GLUCOTROL) 10 MG tablet   Other Relevant Orders   Comprehensive metabolic panel   Hemoglobin A1c   HM DIABETES EYE EXAM (Completed)   Microalbumin / creatinine urine ratio   Hypertension    Well controlled. Continue efforts for healthy lifestyle changes and current medications.      Relevant Medications   spironolactone (ALDACTONE) 50 MG tablet   metoprolol succinate (TOPROL-XL) 50 MG 24 hr tablet   lisinopril (PRINIVIL,ZESTRIL) 40 MG tablet   labetalol (NORMODYNE) 200 MG tablet   hydrALAZINE (APRESOLINE) 100 MG tablet   Other Relevant Orders   CBC with Differential/Platelet   Comprehensive metabolic panel   TSH  Hyperlipidemia    Likely remains uncontrolled, as he has not been taking the Zetia, due to cost. Await lab results. He is intolerant to statins, and is on gemfibrozil.      Relevant Medications   spironolactone (ALDACTONE) 50 MG tablet   metoprolol succinate (TOPROL-XL) 50 MG 24 hr tablet   lisinopril (PRINIVIL,ZESTRIL) 40 MG tablet   labetalol (NORMODYNE) 200 MG tablet   hydrALAZINE (APRESOLINE) 100 MG tablet   Other Relevant Orders   Comprehensive metabolic panel   Lipid panel   DDD (degenerative disc disease), lumbar    Worsening pain, with radiculopathy to the RIGHT leg. Avoid NSAIDS, due to aldosteronism. Trial of gabapentin. Hydrocodone for PRN use at HS. Refer to orthopedics for consideration of other treatment options.       Relevant Medications   gabapentin (NEURONTIN) 300 MG capsule   HYDROcodone-acetaminophen (NORCO) 5-325 MG tablet   Other Relevant Orders   Ambulatory referral to Orthopedic Surgery   Palpitations    Triggered by ibuprofen, but otherwise controlled with metoprolol. Needs to avoid NSAIDS.      Relevant Medications    metoprolol succinate (TOPROL-XL) 50 MG 24 hr tablet   Aortic atherosclerosis ([place])    Incidental finding on lumbar radiographs 05/2016. Continue efforts to control HTN, diabetes and lipids.      Relevant Medications   spironolactone (ALDACTONE) 50 MG tablet   metoprolol succinate (TOPROL-XL) 50 MG 24 hr tablet   lisinopril (PRINIVIL,ZESTRIL) 40 MG tablet   labetalol (NORMODYNE) 200 MG tablet   hydrALAZINE (APRESOLINE) 100 MG tablet   Cholelithiasis    Incidental finding on lumbar radiographs 05/2016. Asymptomatic.       Other Visit Diagnoses    Need for influenza vaccination       declines   Need for pneumococcal vaccination       declines       Return in about 3 months (around 03/09/2017) for re-evaluation of diabetes, blood pressure and cholesterol.   Fara Chute, PA-C [facility]

## 2016-12-07 NOTE — Assessment & Plan Note (Signed)
Incidental finding on lumbar radiographs 05/2016. Asymptomatic.

## 2016-12-08 LAB — TSH: TSH: 1.65 u[IU]/mL (ref 0.450–4.500)

## 2016-12-08 LAB — CBC WITH DIFFERENTIAL/PLATELET
BASOS: 1 %
Basophils Absolute: 0.1 10*3/uL (ref 0.0–0.2)
EOS (ABSOLUTE): 0.7 10*3/uL — AB (ref 0.0–0.4)
EOS: 7 %
HEMATOCRIT: 38.4 % (ref 37.5–51.0)
Hemoglobin: 13.2 g/dL (ref 13.0–17.7)
IMMATURE GRANS (ABS): 0 10*3/uL (ref 0.0–0.1)
IMMATURE GRANULOCYTES: 0 %
LYMPHS: 31 %
Lymphocytes Absolute: 2.8 10*3/uL (ref 0.7–3.1)
MCH: 30.5 pg (ref 26.6–33.0)
MCHC: 34.4 g/dL (ref 31.5–35.7)
MCV: 89 fL (ref 79–97)
MONOCYTES: 8 %
Monocytes Absolute: 0.8 10*3/uL (ref 0.1–0.9)
NEUTROS ABS: 4.7 10*3/uL (ref 1.4–7.0)
NEUTROS PCT: 53 %
PLATELETS: 302 10*3/uL (ref 150–379)
RBC: 4.33 x10E6/uL (ref 4.14–5.80)
RDW: 13.4 % (ref 12.3–15.4)
WBC: 9 10*3/uL (ref 3.4–10.8)

## 2016-12-08 LAB — COMPREHENSIVE METABOLIC PANEL
A/G RATIO: 1.9 (ref 1.2–2.2)
ALT: 25 IU/L (ref 0–44)
AST: 21 IU/L (ref 0–40)
Albumin: 4.9 g/dL — ABNORMAL HIGH (ref 3.6–4.8)
Alkaline Phosphatase: 59 IU/L (ref 39–117)
BILIRUBIN TOTAL: 0.3 mg/dL (ref 0.0–1.2)
BUN / CREAT RATIO: 23 (ref 10–24)
BUN: 21 mg/dL (ref 8–27)
CALCIUM: 10.1 mg/dL (ref 8.6–10.2)
CHLORIDE: 105 mmol/L (ref 96–106)
CO2: 20 mmol/L (ref 20–29)
Creatinine, Ser: 0.91 mg/dL (ref 0.76–1.27)
GFR, EST AFRICAN AMERICAN: 103 mL/min/{1.73_m2} (ref >59)
GFR, EST NON AFRICAN AMERICAN: 89 mL/min/{1.73_m2} (ref >59)
Globulin, Total: 2.6 g/dL (ref 1.5–4.5)
Glucose: 155 mg/dL — ABNORMAL HIGH (ref 65–99)
POTASSIUM: 5 mmol/L (ref 3.5–5.2)
SODIUM: 143 mmol/L (ref 134–144)
TOTAL PROTEIN: 7.5 g/dL (ref 6.0–8.5)

## 2016-12-08 LAB — LIPID PANEL
CHOL/HDL RATIO: 6.2 ratio — AB (ref 0.0–5.0)
CHOLESTEROL TOTAL: 205 mg/dL — AB (ref 100–199)
HDL: 33 mg/dL — ABNORMAL LOW (ref >39)
LDL CALC: 103 mg/dL — AB (ref 0–99)
Triglycerides: 347 mg/dL — ABNORMAL HIGH (ref 0–149)
VLDL CHOLESTEROL CAL: 69 mg/dL — AB (ref 5–40)

## 2016-12-08 LAB — HEMOGLOBIN A1C
Est. average glucose Bld gHb Est-mCnc: 137 mg/dL
Hgb A1c MFr Bld: 6.4 % — ABNORMAL HIGH (ref 4.8–5.6)

## 2016-12-08 LAB — MICROALBUMIN / CREATININE URINE RATIO
CREATININE, UR: 63.2 mg/dL
MICROALBUM., U, RANDOM: 3.1 ug/mL
Microalb/Creat Ratio: 4.9 mg/g creat (ref 0.0–30.0)

## 2016-12-09 ENCOUNTER — Encounter: Payer: Self-pay | Admitting: Physician Assistant

## 2016-12-10 ENCOUNTER — Other Ambulatory Visit: Payer: Self-pay

## 2016-12-10 DIAGNOSIS — E785 Hyperlipidemia, unspecified: Secondary | ICD-10-CM

## 2016-12-10 MED ORDER — GEMFIBROZIL 600 MG PO TABS: 600.0000 mg | ORAL_TABLET | Freq: Two times a day (BID) | ORAL | 1 refills | Status: DC

## 2016-12-17 ENCOUNTER — Encounter: Payer: Self-pay | Admitting: Physician Assistant

## 2017-01-12 ENCOUNTER — Ambulatory Visit (INDEPENDENT_AMBULATORY_CARE_PROVIDER_SITE_OTHER): Payer: PRIVATE HEALTH INSURANCE | Admitting: Orthopaedic Surgery

## 2017-01-12 ENCOUNTER — Encounter (INDEPENDENT_AMBULATORY_CARE_PROVIDER_SITE_OTHER): Payer: Self-pay | Admitting: Orthopaedic Surgery

## 2017-01-12 DIAGNOSIS — M545 Low back pain: Secondary | ICD-10-CM

## 2017-01-12 DIAGNOSIS — M1610 Unilateral primary osteoarthritis, unspecified hip: Secondary | ICD-10-CM | POA: Diagnosis not present

## 2017-01-12 NOTE — Progress Notes (Signed)
Office Visit Note   Patient: Kyle [name]           Date of Birth: [birth year]           MRN: [id number] Visit Date: 01/12/2017              Requested by: Porfirio OarJeffery, Chelle, PA-C [address] [place], [place] [id number] PCP: Porfirio OarJeffery, Chelle, PA-C   Assessment & Plan: Visit Diagnoses:  1. Low back pain, unspecified back pain laterality, unspecified chronicity, with sciatica presence unspecified   2. Arthritis, hip     Plan: We will recheck him again in 5 weeks.  Walking program ,activity as tolerated.  He will call if he develops increased radicular symptoms.   Follow-Up Instructions: Return in about 5 weeks (around 02/16/2017).   Orders:  Orders Placed This Encounter  Procedures  . Ambulatory referral to Physical Therapy   No orders of the defined types were placed in this encounter.     Procedures: No procedures performed   Clinical Data: No additional findings.   Subjective: Chief Complaint  Patient presents with  . Right Hip - Pain    HPI patient visit with right hip pain into  Legs since April 2018. No   Injections, Norco does not seem to really help the pain. He has been on gabapentin  Which caused his heart to  Race so he  stopped the gabapentin.    He does have problems with diabetes and hypertension.  His arm oral medications for diabetes.  Associated bowel or bladder symptoms.  Review of Systems D, type 2 diabetes on oral medication, hypertension, hyperlipidemia, PAD, aortic atherosclerosis, cholelithiasis, or migraine.  Past smoker for 46 years, currently does not smoke.   Objective: Vital Signs: There were no vitals taken for this visit.  Physical Exam  Constitutional: He is oriented to person, place, and time. He appears well-developed and well-nourished.  HENT:  Head: Normocephalic and atraumatic.  Eyes: EOM are normal. Pupils are equal, round, and reactive to light.  Neck: No tracheal deviation present. No thyromegaly present.  Cardiovascular:  Normal rate.  Pulmonary/Chest: Effort normal. He has no wheezes.  Abdominal: Soft. Bowel sounds are normal.  Neurological: He is alert and oriented to person, place, and time.  Skin: Skin is warm and dry. Capillary refill takes less than 2 seconds.  Psychiatric: He has a normal mood and affect. His behavior is normal. Judgment and thought content normal.    Ortho Exam hip.  Some discomfort straight leg raising positive logroll more on the right than left hip.  Pedal  Pulses palpable.  Increased pain with turning and twisting lumbar. AT, GS intact.   Specialty Comments:  No specialty comments available.  Imaging: CLINICAL DATA:  Right leg pain.  No known injury.  EXAM: DG HIP (WITH OR WITHOUT PELVIS) 2-3V RIGHT  COMPARISON:  No recent prior .  FINDINGS: Degenerative changes lumbar spine and both hips. No acute bony abnormality identified. No evidence of fracture or dislocation.  IMPRESSION: 1. Degenerative changes lumbar spine and both hips. No acute abnormality.  2. Peripheral vascular disease.   Electronically Signed   By: Maisie Fushomas  Register   On: 06/03/2016 13:29  CLINICAL DATA:  Back pain. Right lower extremity pain. No reported injury.  EXAM: LUMBAR SPINE - COMPLETE 4+ VIEW  COMPARISON:  None.  FINDINGS: This report assumes 5 non rib-bearing lumbar vertebrae.  Lumbar vertebral body heights are preserved, with no fracture.  Moderate multilevel degenerative disc disease in the visualized  thoracolumbar spine, most prominent at L3-4. Minimal 2 mm retrolisthesis at L3-4. Mild facet arthropathy bilaterally in the lower lumbar spine. No aggressive appearing focal osseous lesions. Abdominal aortic atherosclerosis. Cholelithiasis.  IMPRESSION: 1. No lumbar spine fracture. 2. Moderate multilevel degenerative disc disease, most prominent at L3-4. 3. Minimal 2 mm retrolisthesis at L3-4. 4. Mild facet arthropathy in the lower lumbar spine . 5. Aortic  atherosclerosis . 6. Cholelithiasis.   Electronically Signed   By: Delbert PhenixJason A Poff M.D.   On: 06/03/2016 13:31   PMFS History: Patient Active Problem List   Diagnosis Date Noted  . Aortic atherosclerosis ([facility]) 12/07/2016  . Cholelithiasis 12/07/2016  . Palpitations 08/31/2016  . DDD (degenerative disc disease), lumbar 06/29/2016  . 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 Arthritis, hip 01/26/2014  . PAD (peripheral artery disease) ([facility]) 04/04/2012  . Cardiovascular risk factor 04/04/2012  . Hypertension 03/17/2012  . Diabetes ([facility]) 03/17/2012  . Hyperlipidemia 03/17/2012  . 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   Past Medical History:  Diagnosis Date  . Diabetes mellitus without complication ([facility])   . History of basal cell carcinoma of skin    shoulder  . HLD (hyperlipidemia)   . Hypertension   . Prediabetes     Family History  Problem Relation Age of Onset  . Diabetes Father   . Hypertension Father   . Hypertension Mother   . Hypertension Sister   . Diabetes Sister        diet controlled    Past Surgical History:  Procedure Laterality Date  . SPINE SURGERY  1994  . TONSILLECTOMY  1961   Social History   Occupational History  . Occupation: retired 07/2015    Comment: former truck Hospital doctordriver, Museum/gallery exhibitions officerforklift driver  . Occupation: drives cars    Comment: [employer], Wednesdays  . Occupation: Parts Delivery    Comment: [employer], 3 days/week  Tobacco Use  . Smoking status: Former Smoker    Types: Cigarettes    Start date: 11/05/2007  . Smokeless tobacco: Never Used  Substance and Sexual Activity  . Alcohol use: No    Alcohol/week: 0.0 oz  . Drug use: No  . Sexual activity: Yes    Partners: Female    Birth control/protection: None

## 2017-01-19 ENCOUNTER — Encounter (INDEPENDENT_AMBULATORY_CARE_PROVIDER_SITE_OTHER): Payer: Self-pay | Admitting: Orthopaedic Surgery

## 2017-02-02 ENCOUNTER — Ambulatory Visit: Payer: PRIVATE HEALTH INSURANCE | Admitting: Physical Therapy

## 2017-02-16 ENCOUNTER — Ambulatory Visit (INDEPENDENT_AMBULATORY_CARE_PROVIDER_SITE_OTHER): Payer: PRIVATE HEALTH INSURANCE | Admitting: Orthopaedic Surgery

## 2017-02-16 ENCOUNTER — Encounter (INDEPENDENT_AMBULATORY_CARE_PROVIDER_SITE_OTHER): Payer: Self-pay | Admitting: Orthopaedic Surgery

## 2017-02-16 VITALS — BP 156/91 | HR 81 | Ht 72.0 in | Wt 189.6 lb

## 2017-02-16 DIAGNOSIS — M5136 Other intervertebral disc degeneration, lumbar region: Secondary | ICD-10-CM

## 2017-02-16 NOTE — Progress Notes (Signed)
Office Visit Note   Patient: Kyle [name]           Date of Birth: 18-Jun-1955           MRN: [id number] Visit Date: 02/16/2017              Requested by: Porfirio Oar, PA-C [address] [place], [place] [address] PCP: Porfirio Oar, PA-C   Assessment & Plan: Visit Diagnoses:  1. DDD (degenerative disc disease), lumbar     Plan: Patient will continue to work on a walking program.  Plain radiographs were reviewed with him again which shows multilevel disc narrowing with facet changes.  He likely had some disc bulge with radicular symptoms which have improved.  He will return if he has increased symptoms.  We discussed symptoms of stenosis as well as radiculopathy with him.  Follow-Up Instructions: Return if symptoms worsen or fail to improve.   Orders:  No orders of the defined types were placed in this encounter.  No orders of the defined types were placed in this encounter.     Procedures: No procedures performed   Clinical Data: No additional findings.   Subjective: Chief Complaint  Patient presents with  . Lower Back - Follow-up    HPI 62 year old male returns for follow-up of back pain and leg pain.  He states the therapy costs were over $200 and he cannot afford to go.  He been working on a walking program at a recommended.  He states his back pain has decreased.  He works part-time delivering auto parts and states that the leg pain is resolved and the back pain is only mild.  He denies bowel or bladder symptoms no fever or chills.  Review of Systems is systems updated unchanged from 01/12/2017,  14 point systems other than as mentioned in HPI.  Asymptomatic cholelithiasis.   Objective: Vital Signs: BP (!) 156/91   Pulse 81   Ht 6' (1.829 m)   Wt 189 lb 9.6 oz (86 kg)   BMI 25.71 kg/m   Physical Exam  Constitutional: He is oriented to person, place, and time. He appears well-developed and well-nourished.  HENT:  Head: Normocephalic and  atraumatic.  Eyes: EOM are normal. Pupils are equal, round, and reactive to light.  Neck: No tracheal deviation present. No thyromegaly present.  Cardiovascular: Normal rate.  Pulmonary/Chest: Effort normal. He has no wheezes.  Abdominal: Soft. Bowel sounds are normal.  Neurological: He is alert and oriented to person, place, and time.  Skin: Skin is warm and dry. Capillary refill takes less than 2 seconds.  Psychiatric: He has a normal mood and affect. His behavior is normal. Judgment and thought content normal.    Ortho Exam posterior tib pulses 2+ and symmetrical.  No venous stasis changes no pitting edema.  Negative logroll to the hips right and left.  Negative straight leg raising 90 degrees.  Anterior tib EHL is strong.  He can heel and toe walk.  No sciatic notch tenderness.  He gets from sitting to standing position easily.  No synovitis upper extremities.  No knee effusion.  Symmetrical quad strength. Specialty Comments:  No specialty comments available.  Imaging: No results found.   PMFS History: Patient Active Problem List   Diagnosis Date Noted  . Aortic atherosclerosis ([facility]) 12/07/2016  . Cholelithiasis 12/07/2016  . Palpitations 08/31/2016  . DDD (degenerative disc disease), lumbar 06/29/2016  . 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 Arthritis, hip 01/26/2014  . PAD (peripheral artery disease) ([facility])  04/04/2012  . Cardiovascular risk factor 04/04/2012  . Hypertension 03/17/2012  . Diabetes ([facility]) 03/17/2012  . Hyperlipidemia 03/17/2012  . 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   Past Medical History:  Diagnosis Date  . Diabetes mellitus without complication ([facility])   . History of basal cell carcinoma of skin    shoulder  . HLD (hyperlipidemia)   . Hypertension   . Prediabetes     Family History  Problem Relation Age of Onset  . Diabetes Father   . Hypertension Father   . Hypertension Mother   . Hypertension Sister   . Diabetes Sister        diet controlled    Past  Surgical History:  Procedure Laterality Date  . SPINE SURGERY  1994  . TONSILLECTOMY  1961   Social History   Occupational History  . Occupation: retired 07/2015    Comment: former truck Hospital doctordriver, Museum/gallery exhibitions officerforklift driver  . Occupation: drives cars    Comment: [employer], Wednesdays  . Occupation: Parts Delivery    Comment: [employer], 3 days/week  Tobacco Use  . Smoking status: Former Smoker    Types: Cigarettes    Start date: 11/05/2007  . Smokeless tobacco: Never Used  Substance and Sexual Activity  . Alcohol use: No    Alcohol/week: 0.0 oz  . Drug use: No  . Sexual activity: Yes    Partners: Female    Birth control/protection: None

## 2017-03-08 ENCOUNTER — Other Ambulatory Visit: Payer: Self-pay

## 2017-03-08 ENCOUNTER — Encounter: Payer: Self-pay | Admitting: Physician Assistant

## 2017-03-08 ENCOUNTER — Ambulatory Visit (INDEPENDENT_AMBULATORY_CARE_PROVIDER_SITE_OTHER): Payer: PRIVATE HEALTH INSURANCE | Admitting: Physician Assistant

## 2017-03-08 VITALS — BP 134/82 | HR 77 | Temp 98.2°F | Resp 18 | Ht 72.0 in | Wt 193.4 lb

## 2017-03-08 DIAGNOSIS — E119 Type 2 diabetes mellitus without complications: Secondary | ICD-10-CM

## 2017-03-08 DIAGNOSIS — M5136 Other intervertebral disc degeneration, lumbar region: Secondary | ICD-10-CM

## 2017-03-08 DIAGNOSIS — K8020 Calculus of gallbladder without cholecystitis without obstruction: Secondary | ICD-10-CM

## 2017-03-08 DIAGNOSIS — I700 Atherosclerosis of aorta: Secondary | ICD-10-CM | POA: Diagnosis not present

## 2017-03-08 DIAGNOSIS — I739 Peripheral vascular disease, unspecified: Secondary | ICD-10-CM

## 2017-03-08 DIAGNOSIS — I10 Essential (primary) hypertension: Secondary | ICD-10-CM

## 2017-03-08 DIAGNOSIS — M51369 Other intervertebral disc degeneration, lumbar region without mention of lumbar back pain or lower extremity pain: Secondary | ICD-10-CM

## 2017-03-08 DIAGNOSIS — E269 Hyperaldosteronism, unspecified: Secondary | ICD-10-CM | POA: Diagnosis not present

## 2017-03-08 DIAGNOSIS — E785 Hyperlipidemia, unspecified: Secondary | ICD-10-CM | POA: Diagnosis not present

## 2017-03-08 LAB — COMPREHENSIVE METABOLIC PANEL
ALBUMIN: 4.8 g/dL (ref 3.6–4.8)
ALT: 24 IU/L (ref 0–44)
AST: 18 IU/L (ref 0–40)
Albumin/Globulin Ratio: 1.9 (ref 1.2–2.2)
Alkaline Phosphatase: 62 IU/L (ref 39–117)
BUN / CREAT RATIO: 23 (ref 10–24)
BUN: 22 mg/dL (ref 8–27)
Bilirubin Total: 0.3 mg/dL (ref 0.0–1.2)
CALCIUM: 10.4 mg/dL — AB (ref 8.6–10.2)
CO2: 18 mmol/L — AB (ref 20–29)
CREATININE: 0.94 mg/dL (ref 0.76–1.27)
Chloride: 105 mmol/L (ref 96–106)
GFR, EST AFRICAN AMERICAN: 99 mL/min/{1.73_m2} (ref >59)
GFR, EST NON AFRICAN AMERICAN: 86 mL/min/{1.73_m2} (ref >59)
GLUCOSE: 173 mg/dL — AB (ref 65–99)
Globulin, Total: 2.5 g/dL (ref 1.5–4.5)
Potassium: 4.5 mmol/L (ref 3.5–5.2)
Sodium: 139 mmol/L (ref 134–144)
TOTAL PROTEIN: 7.3 g/dL (ref 6.0–8.5)

## 2017-03-08 LAB — LIPID PANEL
CHOLESTEROL TOTAL: 215 mg/dL — AB (ref 100–199)
Chol/HDL Ratio: 6.9 ratio — ABNORMAL HIGH (ref 0.0–5.0)
HDL: 31 mg/dL — ABNORMAL LOW (ref >39)
LDL Calculated: 107 mg/dL — ABNORMAL HIGH (ref 0–99)
Triglycerides: 386 mg/dL — ABNORMAL HIGH (ref 0–149)
VLDL Cholesterol Cal: 77 mg/dL — ABNORMAL HIGH (ref 5–40)

## 2017-03-08 LAB — HEMOGLOBIN A1C
ESTIMATED AVERAGE GLUCOSE: 143 mg/dL
HEMOGLOBIN A1C: 6.6 % — AB (ref 4.8–5.6)

## 2017-03-08 MED ORDER — HYDROCODONE-ACETAMINOPHEN 5-325 MG PO TABS: 1.0000 | ORAL_TABLET | Freq: Every evening | ORAL | 0 refills | Status: DC | PRN

## 2017-03-08 MED ORDER — FISH OIL 1200 MG PO CAPS: 1.0000 | ORAL_CAPSULE | Freq: Every morning | ORAL | 0 refills | Status: AC

## 2017-03-08 NOTE — Assessment & Plan Note (Signed)
Controlled. Continue current treatment. 

## 2017-03-08 NOTE — Assessment & Plan Note (Signed)
Continue walking and follow-up with orthopedics. Continue PRN hydrocodone.

## 2017-03-08 NOTE — Progress Notes (Signed)
Patient ID: Kyle [name], male    DOB: August 17, 1955, 62 y.o.   MRN: [id number]  PCP: Porfirio OarJeffery, Cara Thaxton, PA-C  Chief Complaint  Patient presents with  . Diabetes    Pt states he does checks sugars twice a day and they've been about 110 and up.  . Hypertension  . Follow-up  . Medication Refill    NORCO    Subjective:   Presents for evaluation of diabetes, HTN and back/hip pain.  Diabetes has been well controlled. Morning readings 150-200, PM readings <130 since last visit.  Home BP as high as 140/78.  Hyperlipidemia has been uncontrolled. He is intolerant to statins. Unable to afford Zetia. Takes gemfibrozil. Did not receive my message to att OTC fish oil following his last labs.  Back and hip pain are improved, but persistent. Intermittent now. Pain keeps him from sleep. He did not tolerate gabapentin, due to palpitations. Was unable to afford PT. Is walking on a home treadmill for exercise.   Review of Systems Denies chest pain, shortness of breath, HA, dizziness, vision change, nausea, vomiting, diarrhea, constipation, melena, hematochezia, dysuria, increased urinary urgency or frequency, increased hunger or thirst, unintentional weight change, unexplained myalgias or arthralgias, rash.     Patient Active Problem List   Diagnosis Date Noted  . Aortic atherosclerosis (HCC) 12/07/2016  . Cholelithiasis 12/07/2016  . Palpitations 08/31/2016  . DDD (degenerative disc disease), lumbar 06/29/2016  . 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 Arthritis, hip 01/26/2014  . PAD (peripheral artery disease) (HCC) 04/04/2012  . Cardiovascular risk factor 04/04/2012  . Hypertension 03/17/2012  . Diabetes (HCC) 03/17/2012  . Hyperlipidemia 03/17/2012  . 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 Authorizing Provider  aspirin 81 MG tablet Take 81 mg by mouth daily.   Yes [provider]  Aspirin-Calcium  Carbonate 81-777 MG TABS Take 81 mg by mouth.   Yes [provider]  b complex vitamins tablet Take by mouth.   Yes [provider]  gemfibrozil (LOPID) 600 MG tablet Take 1 tablet (600 mg total) by mouth 2 (two) times daily before a meal. 12/10/16  Yes Latonyia Lopata, PA-C  glipiZIDE (GLUCOTROL) 10 MG tablet TAKE 1 TABLET TWICE A DAY BEFORE MEALS 12/07/16  Yes Kimiyah Blick, PA-C  glucose blood test strip Use as instructed 12/30/15  Yes Ciaran Begay, PA-C  hydrALAZINE (APRESOLINE) 100 MG tablet TAKE 1 TABLET THREE TIMES A DAY 12/07/16  Yes Harpreet Pompey, PA-C  HYDROcodone-acetaminophen (NORCO) 5-325 MG tablet Take 1-2 tablets by mouth at bedtime as needed. 12/07/16  Yes Yandiel Bergum, PA-C  labetalol (NORMODYNE) 200 MG tablet Take 1 tablet (200 mg total) by mouth 2 (two) times daily. 12/07/16  Yes Porfirio OarJeffery, Corrina Steffensen, PA-C  Lancets 30G MISC Use to check home glucose daily and as needed 12/30/15  Yes Chrys Landgrebe, PA-C  lisinopril (PRINIVIL,ZESTRIL) 40 MG tablet Take 1 tablet (40 mg total) by mouth daily. 12/07/16  Yes Gabreil Yonkers, PA-C  metFORMIN (GLUCOPHAGE) 500 MG tablet Take 2 tablets (1,000 mg total) by mouth 2 (two) times daily with a meal. 12/07/16  Yes Jenette Rayson, PA-C  metoprolol succinate (TOPROL-XL) 50 MG 24 hr tablet Take 1 tablet (50 mg total) by mouth daily. Take with or immediately following a meal. 12/07/16  Yes Camary Sosa, PA-C  spironolactone (ALDACTONE) 50 MG tablet Take 1 tablet (50 mg total) by mouth daily. 12/07/16  Yes Porfirio OarJeffery, Yoselin Amerman, PA-C  gabapentin (NEURONTIN) 300 MG capsule Take 1 capsule (300 mg total) by mouth 3 (three) times daily. Patient not taking: Reported on 03/08/2017 12/07/16   Porfirio Oar, PA-C     Allergies  Allergen Reactions  . Statins     Leg cramps       Objective:  Physical Exam  Constitutional: He is oriented to person, place, and time. He appears well-developed and well-nourished. He is active and  cooperative. No distress.  BP 134/82 (BP Location: Left Arm, Patient Position: Sitting, Cuff Size: Normal)   Pulse 77   Temp 98.2 F (36.8 C) (Oral)   Resp 18   Ht 6' (1.829 m)   Wt 193 lb 6.4 oz (87.7 kg)   SpO2 97%   BMI 26.23 kg/m   HENT:  Head: Normocephalic and atraumatic.  Right Ear: Hearing normal.  Left Ear: Hearing normal.  Eyes: Conjunctivae are normal. No scleral icterus.  Neck: Normal range of motion. Neck supple. No thyromegaly present.  Cardiovascular: Normal rate, regular rhythm and normal heart sounds.  Pulses:      Radial pulses are 2+ on the right side, and 2+ on the left side.  Pulmonary/Chest: Effort normal and breath sounds normal.  Lymphadenopathy:       Head (right side): No tonsillar, no preauricular, no posterior auricular and no occipital adenopathy present.       Head (left side): No tonsillar, no preauricular, no posterior auricular and no occipital adenopathy present.    He has no cervical adenopathy.       Right: No supraclavicular adenopathy present.       Left: No supraclavicular adenopathy present.  Neurological: He is alert and oriented to person, place, and time. No sensory deficit.  Skin: Skin is warm, dry and intact. No rash noted. No cyanosis or erythema. Nails show no clubbing.  Psychiatric: He has a normal mood and affect. His speech is normal and behavior is normal.           Assessment & Plan:   Problem List Items Addressed This Visit    Diabetes (HCC) - Primary (Chronic)    Anticipate continued control.      Relevant Orders   Hemoglobin A1c   Comprehensive metabolic panel   Hypertension    Controlled. Continue current treatment.      Relevant Orders   Comprehensive metabolic panel   Hyperlipidemia    Anticipate continue lack of control. Continue gemfibrozil and add OTC fish oil.      Relevant Medications   Omega-3 Fatty Acids (FISH OIL) 1200 MG CAPS   Other Relevant Orders   Lipid panel   Comprehensive metabolic  panel   PAD (peripheral artery disease) (HCC)    Continue efforts to manage diabetes, HTN and lipids.      Aldosteronism [facility])    Continue treatment and follow-up per nephrology.      DDD (degenerative disc disease), lumbar    Continue walking and follow-up with orthopedics. Continue PRN hydrocodone.      Relevant Medications   HYDROcodone-acetaminophen (NORCO) 5-325 MG tablet   Aortic atherosclerosis (HCC)    Continue management of diabetes, HTN and lipids.      Cholelithiasis    Asymptomatic. No treatment needed. If becomes symptomatic, would refer to general surgery.          Return in about 3 months (around 06/06/2017) for re-evaluation of diabetes, cholesterol.   Fernande Bras, PA-C Primary Care at [facility]

## 2017-03-08 NOTE — Assessment & Plan Note (Signed)
Anticipate continued control.

## 2017-03-08 NOTE — Patient Instructions (Addendum)
Get back into the walking, try to get 150 minutes of exercise every week.    IF you received an x-ray today, you will [facility]. Please contact [facility] at [phone] with questions or concerns regarding your invoice.   IF you received labwork today, you will receive an invoice from [facility]. Please contact [facility] at [phone] with questions or concerns regarding your invoice.   Our billing staff will not be able to assist you with questions regarding bills from these companies.  You will be contacted with the lab results as soon as they are available. The fastest way to get your results is to activate your My Chart account. Instructions are located on the last page of this paperwork. If you have not heard from us regarding the results in 2 weeks, please contact this office.

## 2017-03-08 NOTE — Assessment & Plan Note (Signed)
Continue treatment and follow-up per nephrology.

## 2017-03-08 NOTE — Assessment & Plan Note (Signed)
Continue efforts to manage diabetes, HTN and lipids.

## 2017-03-08 NOTE — Assessment & Plan Note (Signed)
Continue management of diabetes, HTN and lipids.

## 2017-03-08 NOTE — Progress Notes (Signed)
Subjective:    Patient ID: Kyle [name], male    DOB: [birth year], 62 y.o.   MRN: [id number]  Chief Complaint  Patient presents with  . Diabetes    Pt states he does checks sugars twice a day and they've been about 110 and up.  . Hypertension  . Follow-up  . Medication Refill    NORCO   Patient was last seen on 12/07/16. Diabetes and HTN was well controlled at that time.  Hyperlipidemia was uncontrolled at last visit. Zetia was too expensive and he has previously been intolerant to statins. Was told to start on fish oil 1200 mg QD after lab results were available, but did not get the message.   On 12/07/16 trial of Gabapentin, did not tolerate due to palpitations. He has seen Dr. Ophelia Charter, orthopedics, for DDD on 01/12/17 and 02/16/17. Dr. Ophelia Charter recommended PT, but it is too expensive for the patient, and a walking program - patient walking on his treadmill at home. He only used hydrocodone PRN when the back pain is too much to tolerate. Hasn't used in over a week. Hip has not been bothering him as much since last visit.   Overall, he feels well today.  Tolerating medications. No current complaints.    Home glucose readings: AM: 150-200; PM: <100-130 Home blood pressure readings: Up to 140/78 Diet: sandwiches, cereal, grapes, packs a dessert for lunch at work Exercise: has not been due to his back, but is going to start back walking on the treadmill Former smoker: quit in 2009  Review of Systems  Constitutional: Negative for chills, fever and unexpected weight change.  Respiratory: Negative for chest tightness and shortness of breath.   Cardiovascular: Negative for chest pain and palpitations.  Gastrointestinal: Negative for diarrhea, nausea and vomiting.  Genitourinary: Negative for difficulty urinating and dysuria.  Neurological: Negative for dizziness, weakness, numbness and headaches.   Patient Active Problem List   Diagnosis Date Noted  . Aortic atherosclerosis (HCC)  12/07/2016  . Cholelithiasis 12/07/2016  . Palpitations 08/31/2016  . DDD (degenerative disc disease), lumbar 06/29/2016  . 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 Arthritis, hip 01/26/2014  . PAD (peripheral artery disease) (HCC) 04/04/2012  . Cardiovascular risk factor 04/04/2012  . Hypertension 03/17/2012  . Diabetes (HCC) 03/17/2012  . Hyperlipidemia 03/17/2012  . 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 Authorizing Provider  aspirin 81 MG tablet Take 81 mg by mouth daily.   Yes [provider]  Aspirin-Calcium Carbonate 81-777 MG TABS Take 81 mg by mouth.   Yes [provider]  b complex vitamins tablet Take by mouth.   Yes [provider]  gemfibrozil (LOPID) 600 MG tablet Take 1 tablet (600 mg total) by mouth 2 (two) times daily before a meal. 12/10/16  Yes Jeffery, Chelle, PA-C  glipiZIDE (GLUCOTROL) 10 MG tablet TAKE 1 TABLET TWICE A DAY BEFORE MEALS 12/07/16  Yes Jeffery, Chelle, PA-C  glucose blood test strip Use as instructed 12/30/15  Yes Jeffery, Chelle, PA-C  hydrALAZINE (APRESOLINE) 100 MG tablet TAKE 1 TABLET THREE TIMES A DAY 12/07/16  Yes Jeffery, Chelle, PA-C  HYDROcodone-acetaminophen (NORCO) 5-325 MG tablet Take 1-2 tablets by mouth at bedtime as needed. 03/08/17  Yes Jeffery, Chelle, PA-C  labetalol (NORMODYNE) 200 MG tablet Take 1 tablet (200 mg total) by mouth 2 (two) times daily. 12/07/16  Yes Jeffery, Chelle, PA-C  Lancets 30G MISC Use to check home glucose daily and  as needed 12/30/15  Yes Jeffery, Chelle, PA-C  lisinopril (PRINIVIL,ZESTRIL) 40 MG tablet Take 1 tablet (40 mg total) by mouth daily. 12/07/16  Yes Jeffery, Chelle, PA-C  metFORMIN (GLUCOPHAGE) 500 MG tablet Take 2 tablets (1,000 mg total) by mouth 2 (two) times daily with a meal. 12/07/16  Yes Jeffery, Chelle, PA-C  metoprolol succinate (TOPROL-XL) 50 MG 24 hr tablet Take 1 tablet (50 mg total) by mouth daily. Take  with or immediately following a meal. 12/07/16  Yes Jeffery, Chelle, PA-C  spironolactone (ALDACTONE) 50 MG tablet Take 1 tablet (50 mg total) by mouth daily. 12/07/16  Yes Jeffery, Chelle, PA-C  gabapentin (NEURONTIN) 300 MG capsule Take 1 capsule (300 mg total) by mouth 3 (three) times daily. Patient not taking: Reported on 03/08/2017 12/07/16   Porfirio OarJeffery, Chelle, PA-C          Allergies  Allergen Reactions  . Statins     Leg cramps      Objective:   Physical Exam  Constitutional: He is oriented to person, place, and time. He appears well-developed and well-nourished.  Neck: No JVD present. No thyromegaly present.  Cardiovascular: Normal rate, regular rhythm, normal heart sounds and intact distal pulses.  Pulses:      Radial pulses are 2+ on the right side, and 2+ on the left side.       Posterior tibial pulses are 2+ on the right side, and 2+ on the left side.  Pulmonary/Chest: Effort normal and breath sounds normal. He has no wheezes. He exhibits no tenderness.  Musculoskeletal: He exhibits no edema.  Lymphadenopathy:    He has no cervical adenopathy.  Neurological: He is alert and oriented to person, place, and time.  Psychiatric: He has a normal mood and affect. His behavior is normal.      Assessment & Plan:  1. Type 2 diabetes mellitus without complication, without long-term current use of insulin (HCC) Has been well-controlled. Anticipate this remains true. Continue healthy lifestyle changes and current medications.   - Hemoglobin A1c - Comprehensive metabolic panel  2. Essential hypertension Well-controlled.  - Comprehensive metabolic panel  3. Hyperlipidemia, unspecified hyperlipidemia type Likely remains uncontrolled on just Gemfibrozil. Start Fish oil 1,200 mg QD.  - Lipid panel - Comprehensive metabolic panel - Omega-3 Fatty Acids (FISH OIL) 1200 MG CAPS; Take 1 capsule (1,200 mg total) by mouth every morning.  Dispense: 90 capsule; Refill: 0  4. DDD  (degenerative disc disease), lumbar Start walking on treadmill again. Goal is 150 minutes of exercise a week.   - HYDROcodone-acetaminophen (NORCO) 5-325 MG tablet; Take 1-2 tablets by mouth at bedtime as needed.  Dispense: 30 tablet; Refill: 0  5. PAD (peripheral artery disease) (HCC) Currently asymptomatic. Continue efforts to manage HTN, diabetes, and hyperlipidemia.  6. Aldosteronism [facility](HCC) Follow-up with nephrologist. Discuss with nephrologist changing Hydralazine to a more affordable medication.   7. Aortic atherosclerosis (HCC) Incidental finding on lumbar radiographs 05/2016. Continue efforts to control HTN, diabetes, and hyperlipidemia.   8. Cholelithiasis Incidental finding on lumbar radiographs 05/2016. Asymptomatic.  Return in about 3 months (around 06/06/2017) for re-evaluation of diabetes, cholesterol.   Alfonse Alpersaroline Lotus Santillo, PA-S

## 2017-03-08 NOTE — Assessment & Plan Note (Signed)
Asymptomatic. No treatment needed. If becomes symptomatic, would refer to general surgery.

## 2017-03-08 NOTE — Assessment & Plan Note (Signed)
Anticipate continue lack of control. Continue gemfibrozil and add OTC fish oil.

## 2017-05-23 ENCOUNTER — Encounter: Payer: Self-pay | Admitting: Physician Assistant

## 2017-06-07 ENCOUNTER — Ambulatory Visit (INDEPENDENT_AMBULATORY_CARE_PROVIDER_SITE_OTHER): Payer: PRIVATE HEALTH INSURANCE | Admitting: Physician Assistant

## 2017-06-07 ENCOUNTER — Other Ambulatory Visit: Payer: Self-pay

## 2017-06-07 ENCOUNTER — Encounter: Payer: Self-pay | Admitting: Physician Assistant

## 2017-06-07 VITALS — BP 138/84 | HR 70 | Temp 97.8°F | Resp 16 | Ht 72.0 in | Wt 189.8 lb

## 2017-06-07 DIAGNOSIS — E119 Type 2 diabetes mellitus without complications: Secondary | ICD-10-CM | POA: Diagnosis not present

## 2017-06-07 DIAGNOSIS — R002 Palpitations: Secondary | ICD-10-CM

## 2017-06-07 DIAGNOSIS — I10 Essential (primary) hypertension: Secondary | ICD-10-CM | POA: Diagnosis not present

## 2017-06-07 DIAGNOSIS — E785 Hyperlipidemia, unspecified: Secondary | ICD-10-CM

## 2017-06-07 LAB — COMPREHENSIVE METABOLIC PANEL
ALBUMIN: 5 g/dL — AB (ref 3.6–4.8)
ALK PHOS: 65 IU/L (ref 39–117)
ALT: 33 IU/L (ref 0–44)
AST: 24 IU/L (ref 0–40)
Albumin/Globulin Ratio: 1.9 (ref 1.2–2.2)
BILIRUBIN TOTAL: 0.3 mg/dL (ref 0.0–1.2)
BUN / CREAT RATIO: 25 — AB (ref 10–24)
BUN: 23 mg/dL (ref 8–27)
CHLORIDE: 102 mmol/L (ref 96–106)
CO2: 21 mmol/L (ref 20–29)
Calcium: 10.5 mg/dL — ABNORMAL HIGH (ref 8.6–10.2)
Creatinine, Ser: 0.91 mg/dL (ref 0.76–1.27)
GFR calc Af Amer: 103 mL/min/{1.73_m2} (ref >59)
GFR calc non Af Amer: 89 mL/min/{1.73_m2} (ref >59)
GLUCOSE: 199 mg/dL — AB (ref 65–99)
Globulin, Total: 2.6 g/dL (ref 1.5–4.5)
Potassium: 4.8 mmol/L (ref 3.5–5.2)
Sodium: 139 mmol/L (ref 134–144)
Total Protein: 7.6 g/dL (ref 6.0–8.5)

## 2017-06-07 LAB — HEMOGLOBIN A1C
Est. average glucose Bld gHb Est-mCnc: 169 mg/dL
Hgb A1c MFr Bld: 7.5 % — ABNORMAL HIGH (ref 4.8–5.6)

## 2017-06-07 LAB — LIPID PANEL
Chol/HDL Ratio: 8 ratio — ABNORMAL HIGH (ref 0.0–5.0)
Cholesterol, Total: 232 mg/dL — ABNORMAL HIGH (ref 100–199)
HDL: 29 mg/dL — AB (ref >39)
Triglycerides: 442 mg/dL — ABNORMAL HIGH (ref 0–149)

## 2017-06-07 MED ORDER — METOPROLOL SUCCINATE ER 50 MG PO TB24: 50.0000 mg | ORAL_TABLET | Freq: Every day | ORAL | 1 refills | Status: DC

## 2017-06-07 MED ORDER — SPIRONOLACTONE 50 MG PO TABS: 50.0000 mg | ORAL_TABLET | Freq: Every day | ORAL | 1 refills | Status: DC

## 2017-06-07 MED ORDER — GEMFIBROZIL 600 MG PO TABS: 600.0000 mg | ORAL_TABLET | Freq: Two times a day (BID) | ORAL | 1 refills | Status: AC

## 2017-06-07 MED ORDER — LISINOPRIL 40 MG PO TABS: 40.0000 mg | ORAL_TABLET | Freq: Every day | ORAL | 1 refills | Status: DC

## 2017-06-07 MED ORDER — HYDRALAZINE HCL 100 MG PO TABS: ORAL_TABLET | ORAL | 1 refills | Status: DC

## 2017-06-07 MED ORDER — LABETALOL HCL 200 MG PO TABS: 200.0000 mg | ORAL_TABLET | Freq: Two times a day (BID) | ORAL | 1 refills | Status: DC

## 2017-06-07 NOTE — Patient Instructions (Signed)
     IF you received an x-ray today, you will receive an invoice from [facility]. Please contact [facility] at [phone] with questions or concerns regarding your invoice.   IF you received labwork today, you will receive an invoice from [facility]. Please contact [facility] at [phone] with questions or concerns regarding your invoice.   Our billing staff will not be able to assist you with questions regarding bills from these companies.  You will be contacted with the lab results as soon as they are available. The fastest way to get your results is to activate your My Chart account. Instructions are located on the last page of this paperwork. If you have not heard from us regarding the results in 2 weeks, please contact this office.     

## 2017-06-07 NOTE — Progress Notes (Signed)
Patient ID: Kyle [name], male    DOB: 15-May-1955, 62 y.o.   MRN: [id number]  PCP: Porfirio Oar, PA-C  Chief Complaint  Patient presents with  . Diabetes    follow up  . Hyperlipidemia    follow up     Subjective:   Presents for evaluation of diabetes and hyperlipidemia.  Reports that he feels well.  No problems or concerns.  Tolerating his medications without adverse effects. Using Good Rx, with significant benefit, especially for the Hydralazine and Labetalol.  Home blood sugars are typically in the 150s when fasting, range from 80-130 just before his evening meal.  No hypoglycemia or hypoglycemic symptoms.  Hemoglobin A1c in January was 6.6%.  Has eliminated sodas, typically has a bowl of cereal or eggs for breakfast.  Evening meal varies.  Active at work, walks on home treadmill 1 mile, twice weekly.  He has been intolerant to statin therapy.   Review of Systems  Constitutional: Negative for activity change, appetite change, fatigue and unexpected weight change.  HENT: Negative for congestion, dental problem, ear pain, hearing loss, mouth sores, postnasal drip, rhinorrhea, sneezing, sore throat, tinnitus and trouble swallowing.   Eyes: Negative for photophobia, pain, redness and visual disturbance.  Respiratory: Negative for cough, chest tightness and shortness of breath.   Cardiovascular: Negative for chest pain, palpitations and leg swelling.  Gastrointestinal: Negative for abdominal pain, blood in stool, constipation, diarrhea, nausea and vomiting.  Endocrine: Negative for cold intolerance, heat intolerance, polydipsia, polyphagia and polyuria.  Genitourinary: Negative for dysuria, frequency, hematuria and urgency.  Musculoskeletal: Negative for arthralgias, gait problem, myalgias and neck stiffness.  Skin: Negative for rash.  Neurological: Negative for dizziness, speech difficulty, weakness, light-headedness, numbness and headaches.  Hematological: Negative  for adenopathy.  Psychiatric/Behavioral: Negative for confusion and sleep disturbance. The patient is not nervous/anxious.        Patient Active Problem List   Diagnosis Date Noted  . Aortic atherosclerosis (HCC) 12/07/2016  . Cholelithiasis 12/07/2016  . Palpitations 08/31/2016  . DDD (degenerative disc disease), lumbar 06/29/2016  . 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 Arthritis, hip 01/26/2014  . PAD (peripheral artery disease) (HCC) 04/04/2012  . Cardiovascular risk factor 04/04/2012  . Hypertension 03/17/2012  . Diabetes (HCC) 03/17/2012  . Hyperlipidemia 03/17/2012  . 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 Authorizing Provider  aspirin 81 MG tablet Take 81 mg by mouth daily.   Yes [provider]  Aspirin-Calcium Carbonate 81-777 MG TABS Take 81 mg by mouth.   Yes [provider]  b complex vitamins tablet Take by mouth.   Yes [provider]  gemfibrozil (LOPID) 600 MG tablet Take 1 tablet (600 mg total) by mouth 2 (two) times daily before a meal. 12/10/16  Yes Bettina Warn, PA-C  glipiZIDE (GLUCOTROL) 10 MG tablet TAKE 1 TABLET TWICE A DAY BEFORE MEALS 12/07/16  Yes Basilia Stuckert, PA-C  glucose blood test strip Use as instructed 12/30/15  Yes Zariana Strub, PA-C  hydrALAZINE (APRESOLINE) 100 MG tablet TAKE 1 TABLET THREE TIMES A DAY 12/07/16  Yes Aleane Wesenberg, PA-C  HYDROcodone-acetaminophen (NORCO) 5-325 MG tablet Take 1-2 tablets by mouth at bedtime as needed. 03/08/17  Yes Saanya Zieske, PA-C  labetalol (NORMODYNE) 200 MG tablet Take 1 tablet (200 mg total) by mouth 2 (two) times daily. 12/07/16  Yes Wyndi Northrup, PA-C  Lancets 30G MISC Use to check home glucose daily and  as needed 12/30/15  Yes Dunya Meiners, PA-C  lisinopril (PRINIVIL,ZESTRIL) 40 MG tablet Take 1 tablet (40 mg total) by mouth daily. 12/07/16  Yes Adhira Jamil, PA-C  metFORMIN (GLUCOPHAGE)  500 MG tablet Take 2 tablets (1,000 mg total) by mouth 2 (two) times daily with a meal. 12/07/16  Yes Anndee Connett, PA-C  metoprolol succinate (TOPROL-XL) 50 MG 24 hr tablet Take 1 tablet (50 mg total) by mouth daily. Take with or immediately following a meal. 12/07/16  Yes Tifani Dack, PA-C  spironolactone (ALDACTONE) 50 MG tablet Take 1 tablet (50 mg total) by mouth daily. 12/07/16  Yes Vonya Ohalloran, PA-C  gabapentin (NEURONTIN) 300 MG capsule Take 1 capsule (300 mg total) by mouth 3 (three) times daily. Patient not taking: Reported on 03/08/2017 12/07/16   Porfirio Oar, PA-C     Allergies  Allergen Reactions  . Statins     Leg cramps       Objective:  Physical Exam  Constitutional: He is oriented to person, place, and time. He appears well-developed and well-nourished. He is active and cooperative. No distress.  BP 138/84   Pulse 70   Temp 97.8 F (36.6 C)   Resp 16   Ht 6' (1.829 m)   Wt 189 lb 12.8 oz (86.1 kg)   SpO2 96%   BMI 25.74 kg/m   HENT:  Head: Normocephalic and atraumatic.  Right Ear: Hearing normal.  Left Ear: Hearing normal.  Eyes: Conjunctivae are normal. No scleral icterus.  Neck: Normal range of motion. Neck supple. No thyromegaly present.  Cardiovascular: Normal rate, regular rhythm and normal heart sounds.  Pulses:      Radial pulses are 2+ on the right side, and 2+ on the left side.  Pulmonary/Chest: Effort normal and breath sounds normal.  Lymphadenopathy:       Head (right side): No tonsillar, no preauricular, no posterior auricular and no occipital adenopathy present.       Head (left side): No tonsillar, no preauricular, no posterior auricular and no occipital adenopathy present.    He has no cervical adenopathy.       Right: No supraclavicular adenopathy present.       Left: No supraclavicular adenopathy present.  Neurological: He is alert and oriented to person, place, and time. No sensory deficit.  Skin: Skin is warm, dry and  intact. No rash noted. No cyanosis or erythema. Nails show no clubbing.  Psychiatric: He has a normal mood and affect. His speech is normal and behavior is normal.    Wt Readings from Last 3 Encounters:  06/07/17 189 lb 12.8 oz (86.1 kg)  03/08/17 193 lb 6.4 oz (87.7 kg)  02/16/17 189 lb 9.6 oz (86 kg)          Assessment & Plan:   Problem List Items Addressed This Visit    Diabetes (HCC) - Primary (Chronic)    Has been controlled.  Continue with healthy lifestyle modification and home glucose monitoring.  Recommend checking a 2 to 3-hour postprandial glucose alternating with fasting.      Relevant Medications   lisinopril (PRINIVIL,ZESTRIL) 40 MG tablet   Other Relevant Orders   Hemoglobin A1c (Completed)   Amb Referral to Nutrition and Diabetic E   Hypertension    Reasonable control.  Continue multidrug regimen.      Relevant Medications   gemfibrozil (LOPID) 600 MG tablet   metoprolol succinate (TOPROL-XL) 50 MG 24 hr tablet   spironolactone (ALDACTONE) 50 MG tablet   lisinopril (  PRINIVIL,ZESTRIL) 40 MG tablet   labetalol (NORMODYNE) 200 MG tablet   hydrALAZINE (APRESOLINE) 100 MG tablet   Hyperlipidemia    Intolerant to statins.  Somewhat high risk.  Continue lifestyle modifications management of hypertension and diabetes and gemfibrozil.      Relevant Medications   gemfibrozil (LOPID) 600 MG tablet   metoprolol succinate (TOPROL-XL) 50 MG 24 hr tablet   spironolactone (ALDACTONE) 50 MG tablet   lisinopril (PRINIVIL,ZESTRIL) 40 MG tablet   labetalol (NORMODYNE) 200 MG tablet   hydrALAZINE (APRESOLINE) 100 MG tablet   Other Relevant Orders   Lipid panel (Completed)   Comprehensive metabolic panel (Completed)   Palpitations    Stable.  Continue metoprolol.      Relevant Medications   metoprolol succinate (TOPROL-XL) 50 MG 24 hr tablet       Return in about 3 months (around 09/06/2017) for re-evaluation of diabetes, blood pressure, cholesterol.   Fernande Brashelle  S. Yulonda Wheeling, PA-C Primary Care at [facility]

## 2017-06-07 NOTE — Progress Notes (Signed)
Subjective:    Patient ID: Kyle [name], male    DOB: [birth year], 62 y.o.   MRN: [id number] Chief Complaint  Patient presents with  . Diabetes    follow up  . Hyperlipidemia    follow up      HPI  62 yo m presents for follow up of T2DM and HLD. [name] has no complaints and reports everything is going well. Requesting printed Rx's today. Blood sugars:  . AM: Averaging 150s . PM: 80-130, just prior to dinner. . Denies any low blood sugar symptoms, feelings of lightheadedness.  Diet: Diet has been consistent since last visit, per pt. Very few sodas (qmonths) Drinks mostly Unsweet tea, water, coffee (only cream).  Breakfast - bowl of cereal or breakfast bowl of eggs.  Dinner - varies, frozen, canned or home cooked. 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 Uses treadmill at home 2x/week, 1 mile each time. Medications: No concerns, taking as prescribed Side effects: Doing well, no concerns.  A1c at last visit, 6.6. Patient doesn't tolerate statins.  Denies polydipsia, polyuria, lightheadedness, headaches, dizziness, weakness.  Denies myalgias, nausea, vomiting, diarrhea, abdominal pain.  Review of Systems  As above.  Patient Active Problem List   Diagnosis Date Noted  . Aortic atherosclerosis (HCC) 12/07/2016  . Cholelithiasis 12/07/2016  . Palpitations 08/31/2016  . DDD (degenerative disc disease), lumbar 06/29/2016  . 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 Arthritis, hip 01/26/2014  . PAD (peripheral artery disease) (HCC) 04/04/2012  . Cardiovascular risk factor 04/04/2012  . Hypertension 03/17/2012  . Diabetes (HCC) 03/17/2012  . Hyperlipidemia 03/17/2012  . 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    Past Medical History:  Diagnosis Date  . Diabetes mellitus without complication (HCC)   . History of basal cell carcinoma of skin    shoulder  . HLD (hyperlipidemia)   . Hypertension   . Prediabetes     Prior to  Admission medications   Medication Sig Start Date End Date Taking? Authorizing Provider  aspirin 81 MG tablet Take 81 mg by mouth daily.    [provider]  Aspirin-Calcium Carbonate 81-777 MG TABS Take 81 mg by mouth.    [provider]  b complex vitamins tablet Take by mouth.    [provider]  gabapentin (NEURONTIN) 300 MG capsule Take 1 capsule (300 mg total) by mouth 3 (three) times daily. Patient not taking: Reported on 03/08/2017 12/07/16   Porfirio OarJeffery, Chelle, PA-C  gemfibrozil (LOPID) 600 MG tablet Take 1 tablet (600 mg total) by mouth 2 (two) times daily before a meal. 12/10/16   Kyle [name], Chelle, PA-C  glipiZIDE (GLUCOTROL) 10 MG tablet TAKE 1 TABLET TWICE A DAY BEFORE MEALS 12/07/16   Porfirio OarJeffery, Chelle, PA-C  glucose blood test strip Use as instructed 12/30/15   Porfirio OarJeffery, Chelle, PA-C  hydrALAZINE (APRESOLINE) 100 MG tablet TAKE 1 TABLET THREE TIMES A DAY 12/07/16   Kyle [name], Chelle, PA-C  HYDROcodone-acetaminophen (NORCO) 5-325 MG tablet Take 1-2 tablets by mouth at bedtime as needed. 03/08/17   Porfirio OarJeffery, Chelle, PA-C  labetalol (NORMODYNE) 200 MG tablet Take 1 tablet (200 mg total) by mouth 2 (two) times daily. 12/07/16   Porfirio OarJeffery, Chelle, PA-C  Lancets 30G MISC Use to check home glucose daily and as needed 12/30/15   Porfirio OarJeffery, Chelle, PA-C  lisinopril (PRINIVIL,ZESTRIL) 40 MG tablet Take 1 tablet (40 mg total) by mouth daily. 12/07/16   Porfirio OarJeffery, Chelle, PA-C  metFORMIN (GLUCOPHAGE) 500 MG tablet Take 2 tablets (1,000  mg total) by mouth 2 (two) times daily with a meal. 12/07/16   Kyle [name], Chelle, PA-C  metoprolol succinate (TOPROL-XL) 50 MG 24 hr tablet Take 1 tablet (50 mg total) by mouth daily. Take with or immediately following a meal. 12/07/16   Kyle [name], Chelle, PA-C  spironolactone (ALDACTONE) 50 MG tablet Take 1 tablet (50 mg total) by mouth daily. 12/07/16   Porfirio Oar, PA-C    Allergies  Allergen Reactions  . Statins     Leg cramps       Objective:    Physical Exam  Constitutional: He is oriented to person, place, and time. He appears well-developed and well-nourished. No distress.  BP 138/84   Pulse 70   Temp 97.8 F (36.6 C)   Resp 16   Ht 6' (1.829 m)   Wt 189 lb 12.8 oz (86.1 kg)   SpO2 96%   BMI 25.74 kg/m    HENT:  Head: Normocephalic and atraumatic.  Nose: Nose normal.  Eyes: Pupils are equal, round, and reactive to light. Conjunctivae and EOM are normal. Right eye exhibits no discharge. Left eye exhibits no discharge.  Neck: Normal range of motion. Neck supple. No thyromegaly present.  Cardiovascular: Normal rate, regular rhythm and intact distal pulses. Exam reveals no gallop and no friction rub.  No murmur heard. Pulses:      Radial pulses are 2+ on the right side, and 2+ on the left side.       Dorsalis pedis pulses are 2+ on the right side, and 2+ on the left side.       Posterior tibial pulses are 2+ on the right side, and 2+ on the left side.  Pulmonary/Chest: Effort normal and breath sounds normal. No respiratory distress. He has no wheezes. He has no rales.  Musculoskeletal: Normal range of motion. He exhibits no edema.  Neurological: He is alert and oriented to person, place, and time. He has normal reflexes.  Skin: Skin is warm and dry. He is not diaphoretic. No erythema.  Psychiatric: He has a normal mood and affect. His behavior is normal.      Assessment & Plan:  1. Type 2 diabetes mellitus without complication, without long-term current use of insulin (HCC) Await labs and f/u. Encouraged to continue eating healthy and exercising. Continue checking blood sugars in the AM and prior to dinner.  - Hemoglobin A1c  2. Hyperlipidemia, unspecified hyperlipidemia type Await labs and f/u. Encouraged to continue eating healthy and exercising. Will adjust meds as needed after labs. - Lipid panel - Comprehensive metabolic panel

## 2017-06-12 NOTE — Assessment & Plan Note (Signed)
Intolerant to statins.  Somewhat high risk.  Continue lifestyle modifications management of hypertension and diabetes and gemfibrozil.

## 2017-06-12 NOTE — Assessment & Plan Note (Signed)
Has been controlled.  Continue with healthy lifestyle modification and home glucose monitoring.  Recommend checking a 2 to 3-hour postprandial glucose alternating with fasting.

## 2017-06-12 NOTE — Assessment & Plan Note (Signed)
--   Stable.  Continue metoprolol. 

## 2017-06-12 NOTE — Assessment & Plan Note (Signed)
Reasonable control.  Continue multidrug regimen.

## 2017-07-12 ENCOUNTER — Encounter: Payer: Self-pay | Admitting: Family Medicine

## 2017-07-31 ENCOUNTER — Other Ambulatory Visit: Payer: Self-pay | Admitting: Physician Assistant

## 2017-07-31 DIAGNOSIS — I10 Essential (primary) hypertension: Secondary | ICD-10-CM

## 2017-12-06 IMAGING — DX DG HIP (WITH OR WITHOUT PELVIS) 2-3V*R*
3 series · 3 of 3 positions shown · non-contrast
Comparison: No recent prior .

CLINICAL DATA: Right leg pain.  No known injury.

EXAM:
DG HIP (WITH OR WITHOUT PELVIS) 2-3V RIGHT

[pelvis ap]
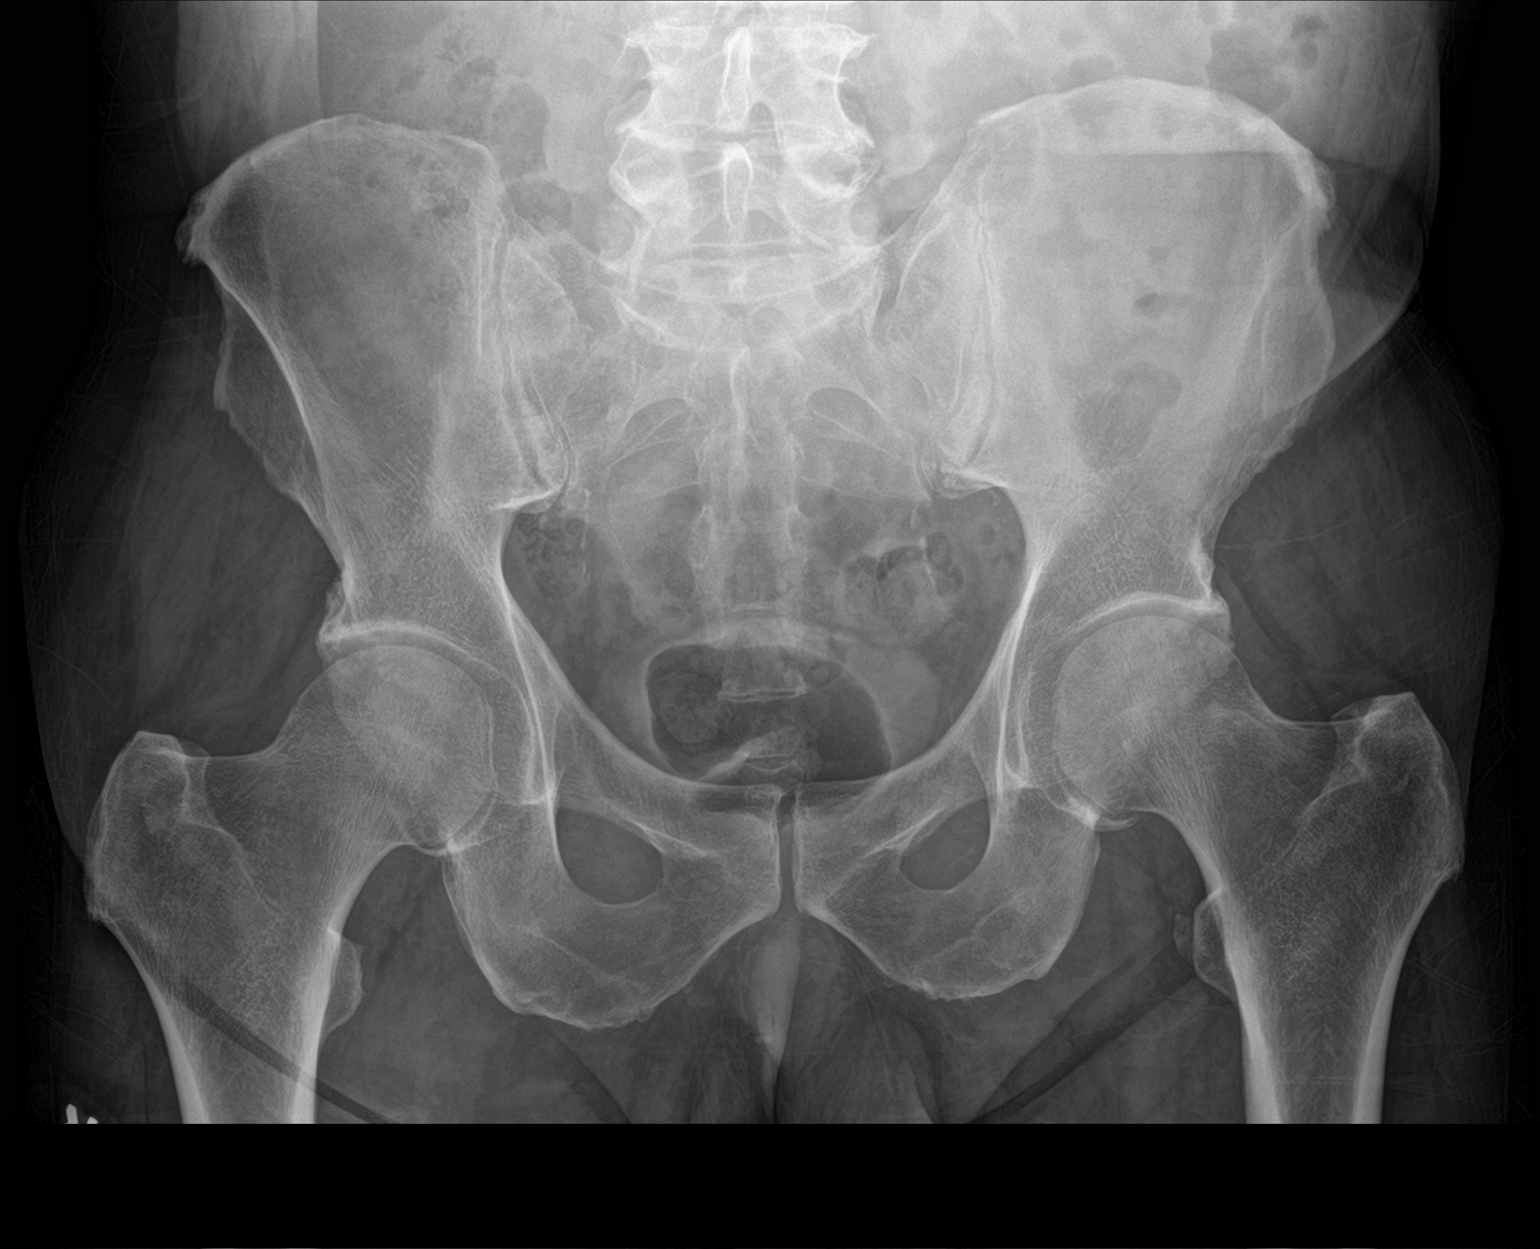

[hip ap]
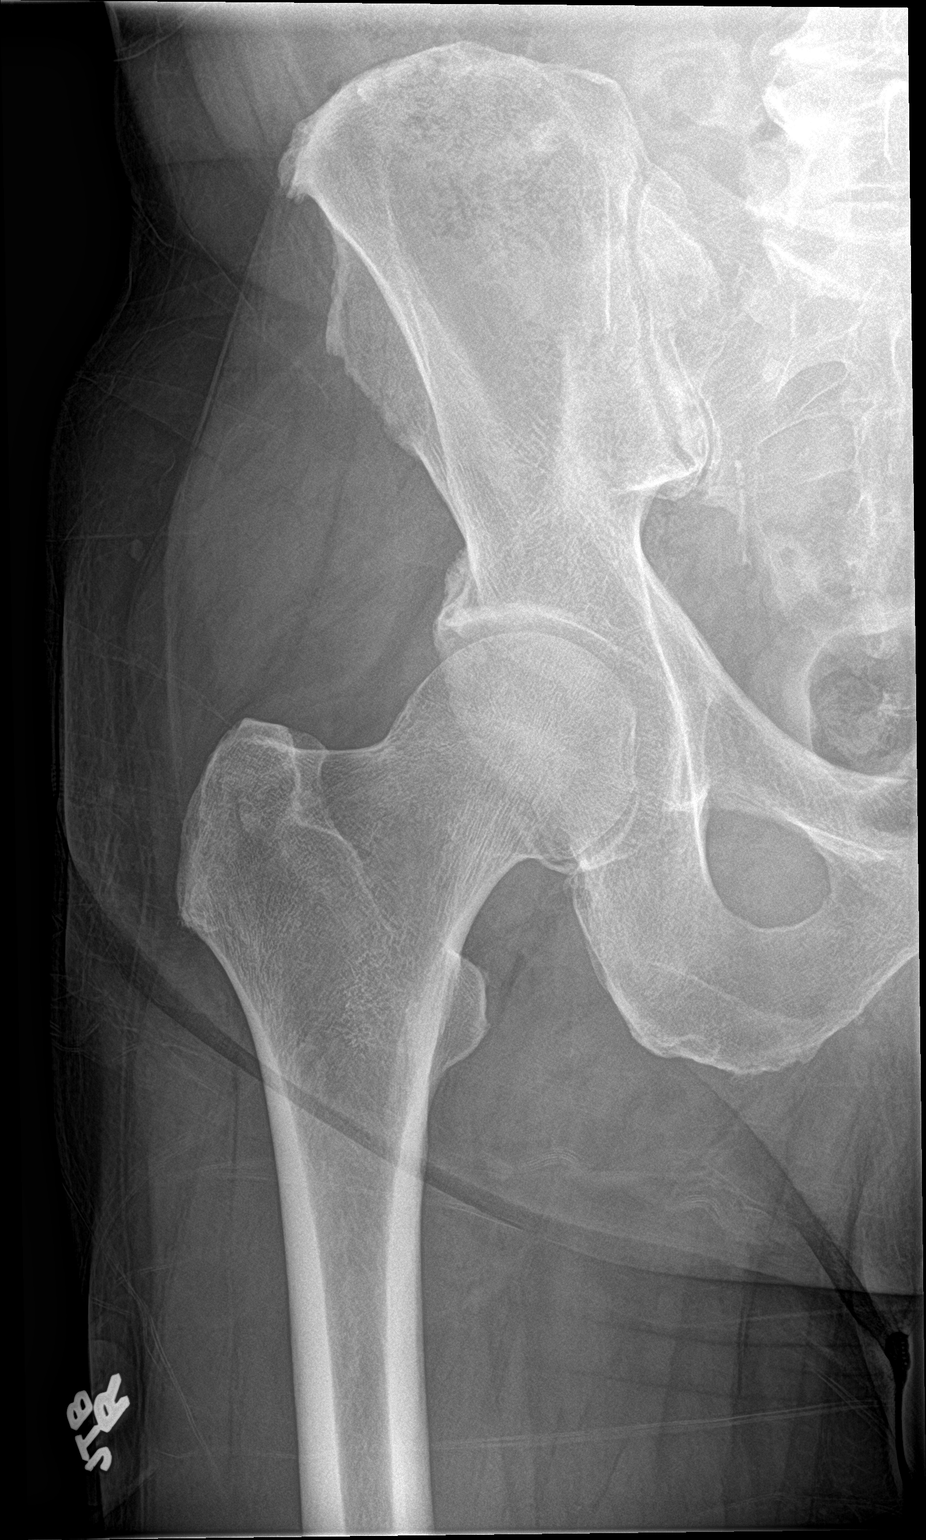

[hip lat]
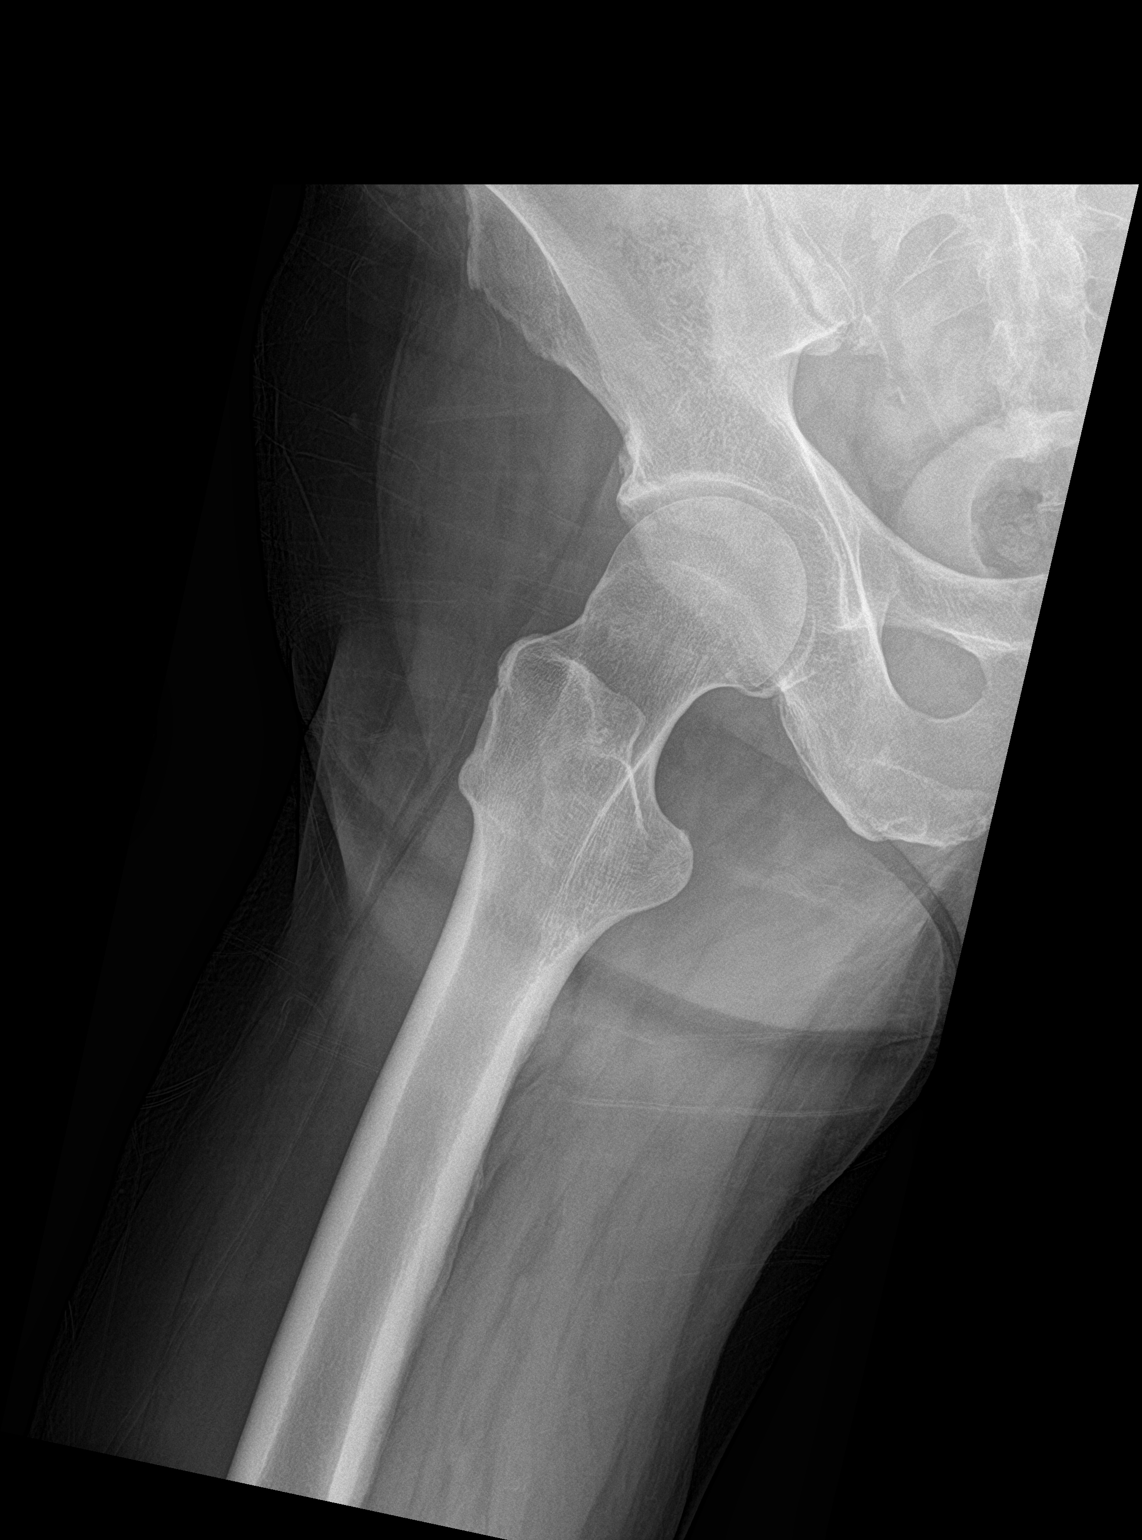

[3 of 3 positions shown; findings below may reference images not displayed]

FINDINGS: Degenerative changes lumbar spine and both hips. No acute bony
abnormality identified. No evidence of fracture or dislocation.
IMPRESSION: 1. Degenerative changes lumbar spine and both hips. No acute
abnormality.

2. Peripheral vascular disease.

## 2018-08-24 ENCOUNTER — Other Ambulatory Visit: Payer: Self-pay | Admitting: Physician Assistant

## 2018-08-24 DIAGNOSIS — Z136 Encounter for screening for cardiovascular disorders: Secondary | ICD-10-CM

## 2018-09-05 ENCOUNTER — Ambulatory Visit
Admission: RE | Admit: 2018-09-05 | Discharge: 2018-09-05 | Disposition: A | Discharge status: Home / Self Care | Payer: Medicare Other | Source: Ambulatory Visit | Attending: Physician Assistant | Admitting: Physician Assistant

## 2018-09-05 DIAGNOSIS — Z136 Encounter for screening for cardiovascular disorders: Secondary | ICD-10-CM

## 2018-12-19 ENCOUNTER — Other Ambulatory Visit: Payer: Self-pay | Admitting: Physician Assistant

## 2018-12-19 DIAGNOSIS — Z87891 Personal history of nicotine dependence: Secondary | ICD-10-CM

## 2018-12-25 ENCOUNTER — Inpatient Hospital Stay: Admission: RE | Admit: 2018-12-25 | Payer: Medicare Other | Source: Ambulatory Visit

## 2020-01-10 LAB — COLOGUARD

## 2020-02-06 LAB — COLOGUARD: COLOGUARD: NEGATIVE

## 2020-03-09 IMAGING — US US ABDOMINAL AORTA SCREENING AAA
2 series · 14 of 20 positions shown · non-contrast
Comparison: None.

CLINICAL DATA: Screening for abdominal aortic aneurysm.

EXAM:
US ABDOMINAL AORTA MEDICARE SCREENING
TECHNIQUE: Ultrasound examination of the abdominal aorta was performed as a
screening evaluation for abdominal aortic aneurysm.

[Series 1: us abdominal aorta screening aaa · 0.26mm/px · 13 of 18 slices shown (1 of 2)]
[im 1/18]
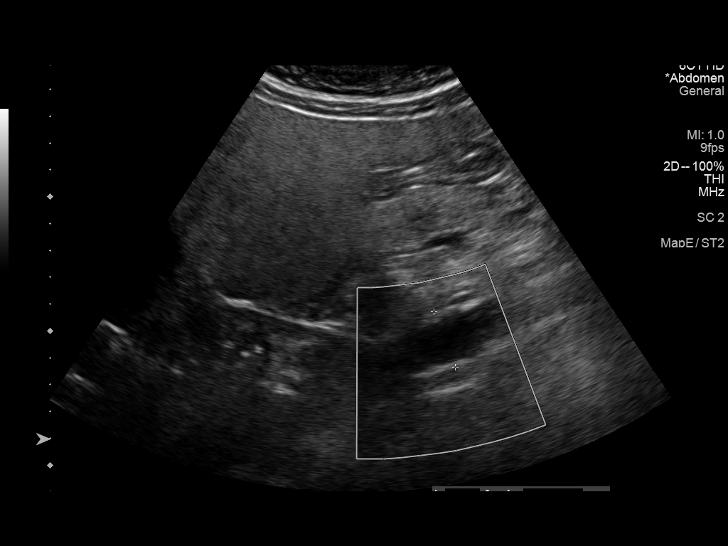
[im 3/18]
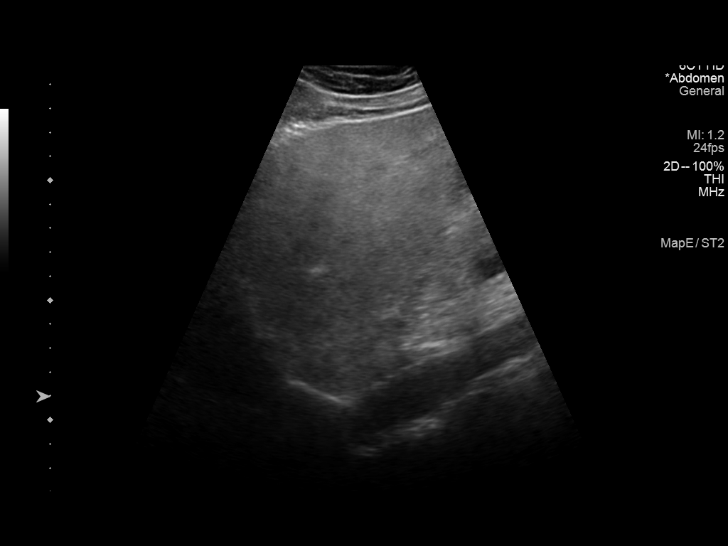
[im 4/18]
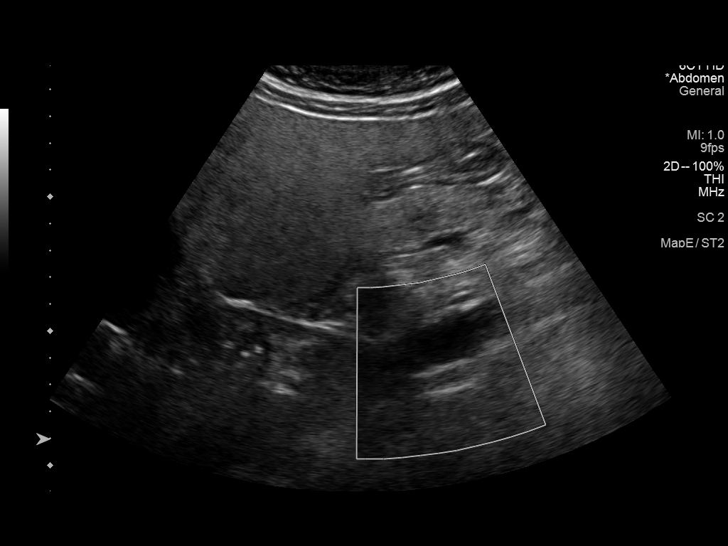
[im 6/18]
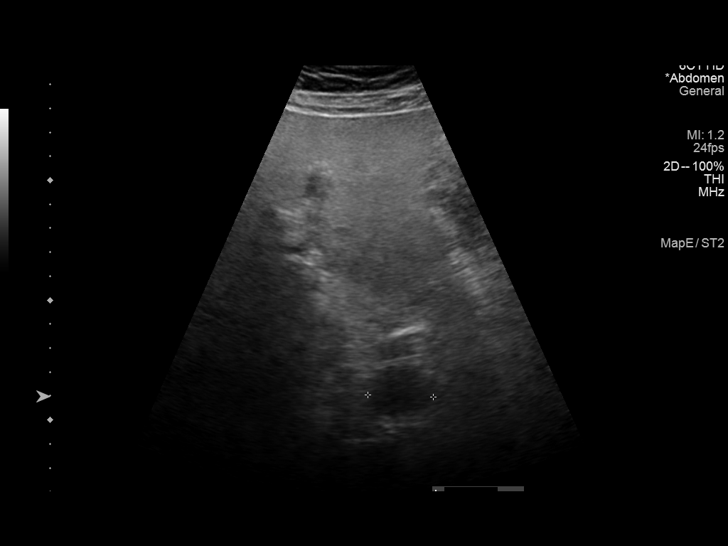
[im 7/18]
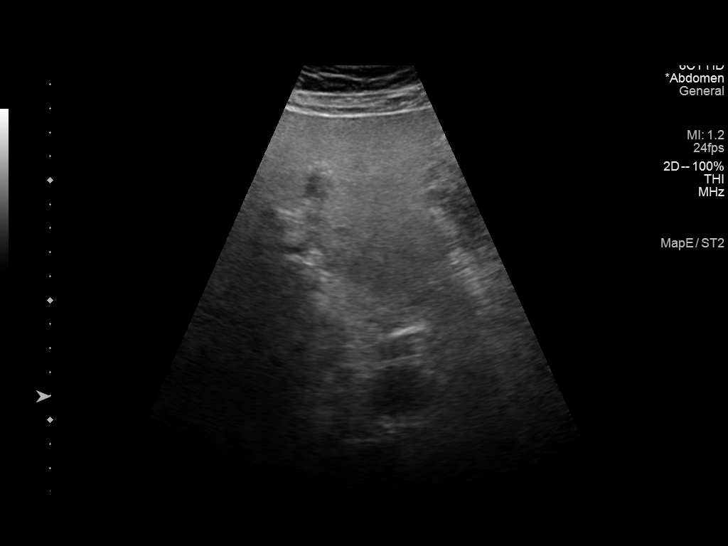
[im 8/18]
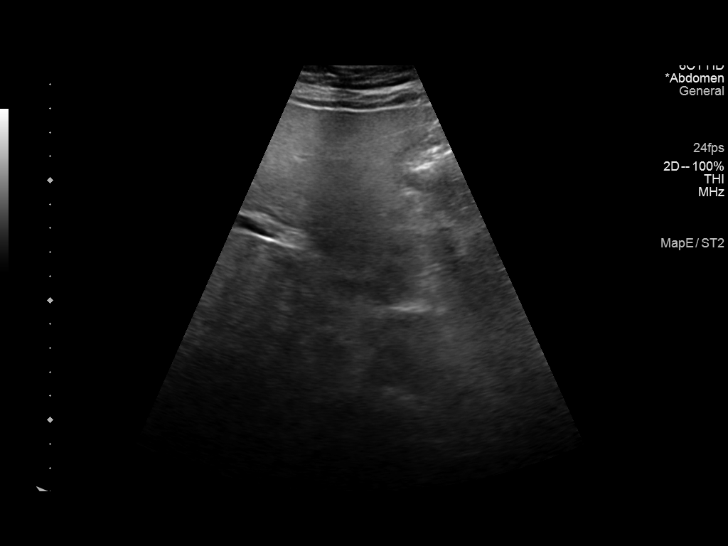
[im 10/18]
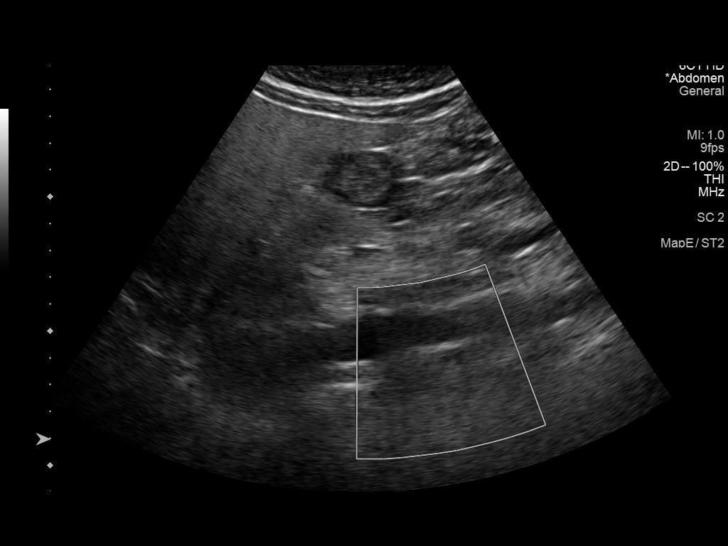
[im 11/18]
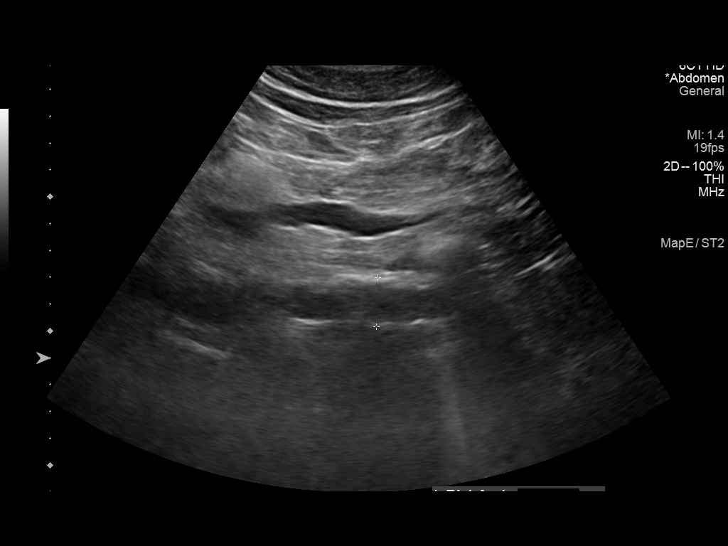
[im 13/18]
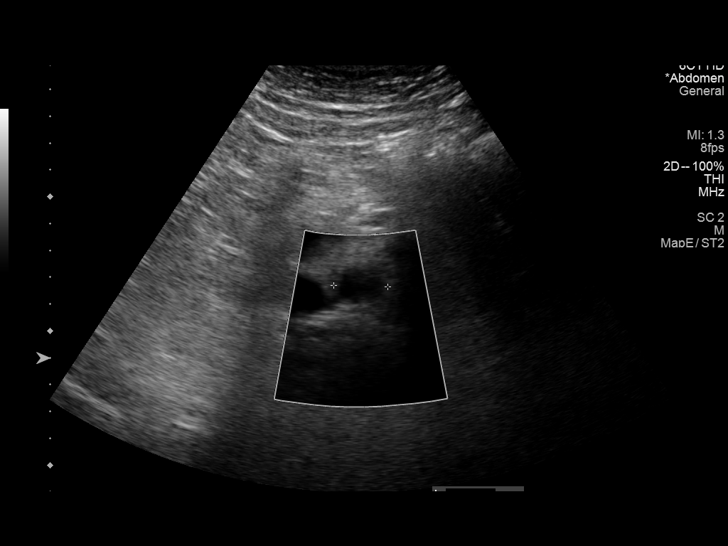
[im 14/18]
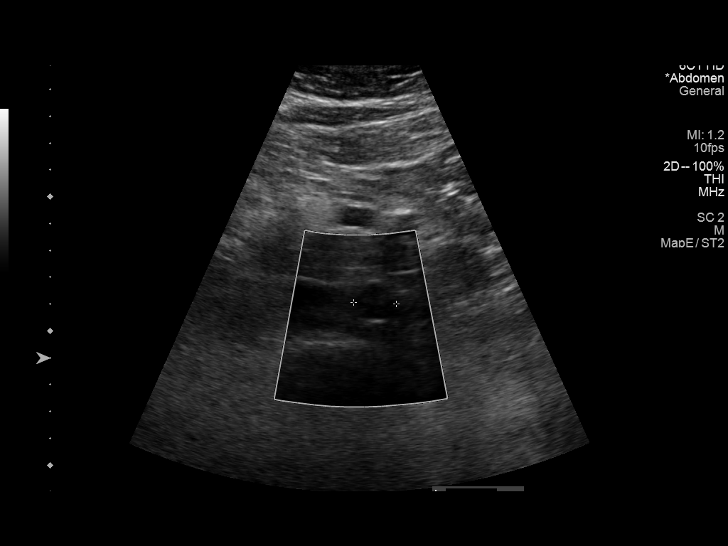
[im 16/18]
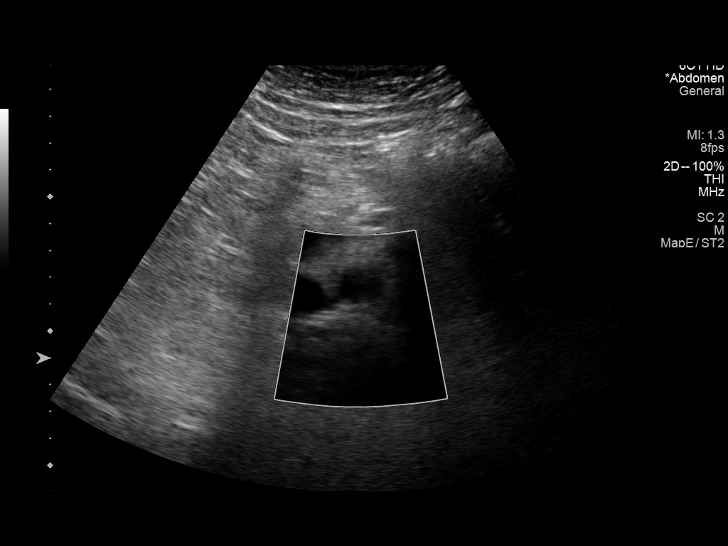
[im 17/18]
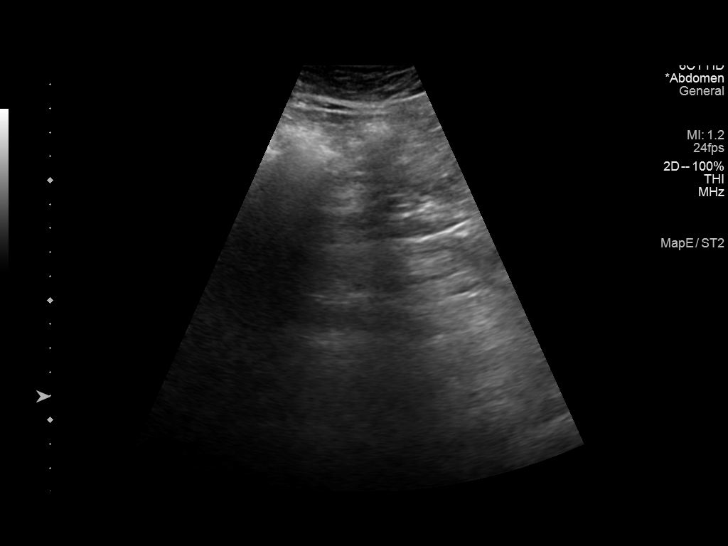
[im 18/18]
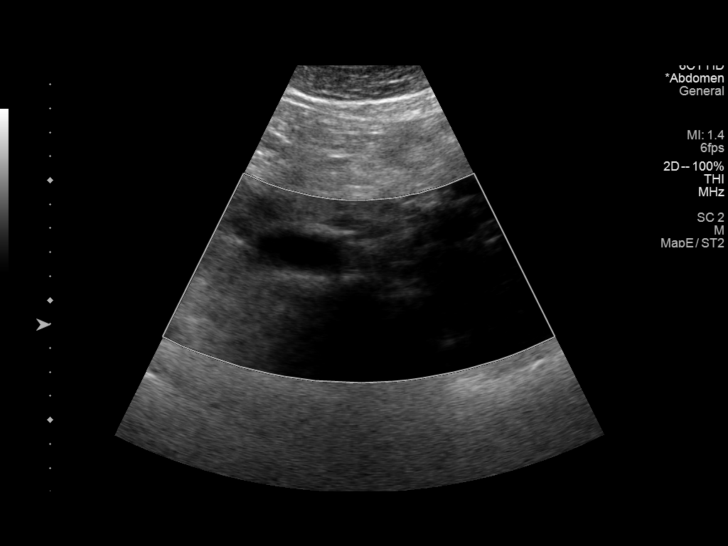

[Series 2001: us abdominal aorta screening aaa · 0.26mm/px · 1 of 2 slices shown (2 of 2)]
[im 2/2]
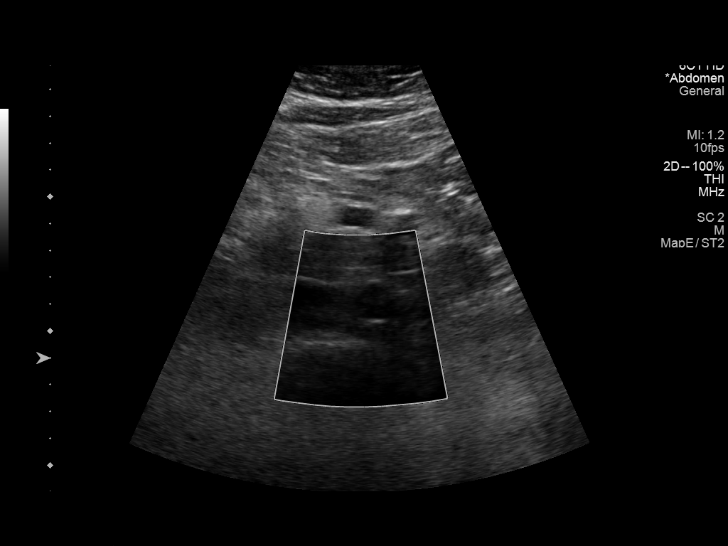

[14 of 20 positions shown; findings below may reference images not displayed]

FINDINGS: Abdominal aortic measurements as follows:

Proximal:  2.7 cm

Mid:  1.7 cm

Distal:  2.0 cm
IMPRESSION: No sonographic evidence of abdominal aortic aneurysm.

Aortic Atherosclerosis ([id number]-31E.E).

## 2022-05-19 ENCOUNTER — Emergency Department (HOSPITAL_COMMUNITY): Payer: Medicare Other

## 2022-05-19 ENCOUNTER — Other Ambulatory Visit: Payer: Self-pay

## 2022-05-19 ENCOUNTER — Emergency Department (HOSPITAL_COMMUNITY)
Admission: EM | Admit: 2022-05-19 | Discharge: 2022-05-20 | Disposition: A | Discharge status: Home / Self Care | Payer: Medicare Other | Attending: Emergency Medicine | Admitting: Emergency Medicine

## 2022-05-19 DIAGNOSIS — I10 Essential (primary) hypertension: Secondary | ICD-10-CM | POA: Insufficient documentation

## 2022-05-19 DIAGNOSIS — H547 Unspecified visual loss: Secondary | ICD-10-CM | POA: Insufficient documentation

## 2022-05-19 DIAGNOSIS — R944 Abnormal results of kidney function studies: Secondary | ICD-10-CM | POA: Insufficient documentation

## 2022-05-19 DIAGNOSIS — R000 Tachycardia, unspecified: Secondary | ICD-10-CM | POA: Insufficient documentation

## 2022-05-19 DIAGNOSIS — R002 Palpitations: Secondary | ICD-10-CM | POA: Insufficient documentation

## 2022-05-19 DIAGNOSIS — Z7984 Long term (current) use of oral hypoglycemic drugs: Secondary | ICD-10-CM | POA: Diagnosis not present

## 2022-05-19 DIAGNOSIS — E1165 Type 2 diabetes mellitus with hyperglycemia: Secondary | ICD-10-CM | POA: Diagnosis not present

## 2022-05-19 DIAGNOSIS — Z7982 Long term (current) use of aspirin: Secondary | ICD-10-CM | POA: Diagnosis not present

## 2022-05-19 DIAGNOSIS — Z79899 Other long term (current) drug therapy: Secondary | ICD-10-CM | POA: Insufficient documentation

## 2022-05-19 DIAGNOSIS — I739 Peripheral vascular disease, unspecified: Secondary | ICD-10-CM | POA: Insufficient documentation

## 2022-05-19 DIAGNOSIS — R519 Headache, unspecified: Secondary | ICD-10-CM | POA: Diagnosis present

## 2022-05-19 DIAGNOSIS — H53469 Homonymous bilateral field defects, unspecified side: Secondary | ICD-10-CM

## 2022-05-19 DIAGNOSIS — Z9189 Other specified personal risk factors, not elsewhere classified: Secondary | ICD-10-CM

## 2022-05-19 LAB — CBC
HCT: 36.9 % — ABNORMAL LOW (ref 39.0–52.0)
Hemoglobin: 13.2 g/dL (ref 13.0–17.0)
MCH: 31 pg (ref 26.0–34.0)
MCHC: 35.8 g/dL (ref 30.0–36.0)
MCV: 86.6 fL (ref 80.0–100.0)
Platelets: 299 10*3/uL (ref 150–400)
RBC: 4.26 MIL/uL (ref 4.22–5.81)
RDW: 11.8 % (ref 11.5–15.5)
WBC: 11.6 10*3/uL — ABNORMAL HIGH (ref 4.0–10.5)
nRBC: 0 % (ref 0.0–0.2)

## 2022-05-19 LAB — COMPREHENSIVE METABOLIC PANEL
ALT: 25 U/L (ref 0–44)
AST: 19 U/L (ref 15–41)
Albumin: 3.9 g/dL (ref 3.5–5.0)
Alkaline Phosphatase: 83 U/L (ref 38–126)
Anion gap: 12 (ref 5–15)
BUN: 43 mg/dL — ABNORMAL HIGH (ref 8–23)
CO2: 19 mmol/L — ABNORMAL LOW (ref 22–32)
Calcium: 10.1 mg/dL (ref 8.9–10.3)
Chloride: 97 mmol/L — ABNORMAL LOW (ref 98–111)
Creatinine, Ser: 1.29 mg/dL — ABNORMAL HIGH (ref 0.61–1.24)
GFR, Estimated: >60 mL/min (ref >60)
Glucose, Bld: 388 mg/dL — ABNORMAL HIGH (ref 70–99)
Potassium: 5 mmol/L (ref 3.5–5.1)
Sodium: 128 mmol/L — ABNORMAL LOW (ref 135–145)
Total Bilirubin: 0.9 mg/dL (ref 0.3–1.2)
Total Protein: 7.3 g/dL (ref 6.5–8.1)

## 2022-05-19 LAB — DIFFERENTIAL
Abs Immature Granulocytes: 0.05 10*3/uL (ref 0.00–0.07)
Basophils Absolute: 0 10*3/uL (ref 0.0–0.1)
Basophils Relative: 0 %
Eosinophils Absolute: 0.2 10*3/uL (ref 0.0–0.5)
Eosinophils Relative: 2 %
Immature Granulocytes: 0 %
Lymphocytes Relative: 24 %
Lymphs Abs: 2.7 10*3/uL (ref 0.7–4.0)
Monocytes Absolute: 0.8 10*3/uL (ref 0.1–1.0)
Monocytes Relative: 7 %
Neutro Abs: 7.8 10*3/uL — ABNORMAL HIGH (ref 1.7–7.7)
Neutrophils Relative %: 67 %

## 2022-05-19 LAB — URINALYSIS, ROUTINE W REFLEX MICROSCOPIC
Bacteria, UA: NONE SEEN
Bilirubin Urine: NEGATIVE
Glucose, UA: >=500 mg/dL — AB
Hgb urine dipstick: NEGATIVE
Ketones, ur: NEGATIVE mg/dL
Leukocytes,Ua: NEGATIVE
Nitrite: NEGATIVE
Protein, ur: NEGATIVE mg/dL
Specific Gravity, Urine: 1.006 (ref 1.005–1.030)
pH: 5 (ref 5.0–8.0)

## 2022-05-19 LAB — I-STAT CHEM 8, ED
BUN: 41 mg/dL — ABNORMAL HIGH (ref 8–23)
Calcium, Ion: 1.31 mmol/L (ref 1.15–1.40)
Chloride: 101 mmol/L (ref 98–111)
Creatinine, Ser: 1.3 mg/dL — ABNORMAL HIGH (ref 0.61–1.24)
Glucose, Bld: 395 mg/dL — ABNORMAL HIGH (ref 70–99)
HCT: 37 % — ABNORMAL LOW (ref 39.0–52.0)
Hemoglobin: 12.6 g/dL — ABNORMAL LOW (ref 13.0–17.0)
Potassium: 5.1 mmol/L (ref 3.5–5.1)
Sodium: 129 mmol/L — ABNORMAL LOW (ref 135–145)
TCO2: 20 mmol/L — ABNORMAL LOW (ref 22–32)

## 2022-05-19 LAB — ETHANOL: Alcohol, Ethyl (B): <10 mg/dL (ref <10)

## 2022-05-19 LAB — APTT: aPTT: 29 seconds (ref 24–36)

## 2022-05-19 LAB — PROTIME-INR
INR: 1 (ref 0.8–1.2)
Prothrombin Time: 13 seconds (ref 11.4–15.2)

## 2022-05-19 MED ORDER — SODIUM CHLORIDE 0.9 % IV BOLUS
1000.0000 mL | Freq: Once | INTRAVENOUS | Status: AC
  Administered 2022-05-19: 1000 mL via INTRAVENOUS

## 2022-05-19 MED ORDER — LACTATED RINGERS IV BOLUS
1000.0000 mL | Freq: Once | INTRAVENOUS | Status: AC
  Administered 2022-05-19: 1000 mL via INTRAVENOUS

## 2022-05-19 NOTE — ED Triage Notes (Signed)
PT arrived via POV after hitting back of head on a shelf Friday. Denies any falls or LOC. Endorses pain that extends to neck with flexion of neck. Endorses peripheral right sided blurriness. Takes aspirin. Took OTC Ibuprofen Mon with no relief. Endorses nausea and headache rated 9/10 pain.

## 2022-05-19 NOTE — ED Notes (Signed)
Provider made aware of pts elevated HR.

## 2022-05-19 NOTE — Discharge Instructions (Addendum)
You came to the emergency department due to a visual disturbance on the right side.  Your MRI is normal.  I would like you to follow-up with neurology outpatient.  They should call you within the next couple of days to set up an appointment.  If they do not, please call their office.  Additionally, make an appointment with your eye doctor.  Lastly, I have placed a referral to cardiology.  They should call you within the next couple of days however if they do not please call cardiologist in the area or follow-up with your PCP about your episode of elevated heart rate.  This is something that can be dangerous to please follow-up about this.  Do not hesitate to return with any worsening or recurring symptoms.  Especially, numbness to part of your body, worsening visual disturbance, weakness, difficulty speaking or facial droop.  It was a pleasure to meet you and we hope you feel better!

## 2022-05-19 NOTE — ED Provider Triage Note (Signed)
Emergency Medicine Provider Triage Evaluation Note  Costa Mackiewicz , a 67 y.o. male  was evaluated in triage.  Pt complains of vision change in the right eye.  Patient states that he hit his head 5 days ago.  This was yesterday he started having a headache in the back of his head and then this morning when he woke up he noticed some change in his right eye vision..  Review of Systems  Positive: Vision change Negative: Other neurodeficit  Physical Exam  BP (!) 140/74   Pulse 90   Temp 98.4 F (36.9 C)   Resp 15   Ht 5\' 10"  (1.778 m)   Wt 79.4 kg   SpO2 97%   BMI 25.11 kg/m  Gen:   Awake, no distress   Resp:  Normal effort  MSK:   Moves extremities without difficulty  Other:  Patient can only see shadows in the right lower temporal quadrant of the right eye  Medical Decision Making  Medically screening exam initiated at 3:11 PM.  Appropriate orders placed.  Malone Lavery was informed that the remainder of the evaluation will be completed by another provider, this initial triage assessment does not replace that evaluation, and the importance of remaining in the ED until their evaluation is complete.  Intraocular versus stroke.  I have ordered evaluation stroke order set which includes PT/INR and APTT.   Margarita Mail, PA-C 05/19/22 1513

## 2022-05-19 NOTE — ED Provider Notes (Signed)
[facility] Provider Note   CSN: [id number] Arrival date & time: 05/19/22  1413     History {Add pertinent medical, surgical, social history, OB history to HPI:1} Chief Complaint  Patient presents with   Headache   Blindness   Head Injury    Kyle [name] is a 67 y.o. male with a past medical history of type 2 diabetes, hyperlipidemia, hypertension, PAD, ocular migraines and cataracts presenting today with a headache and right-sided vision, for the past 2 days.  He reports that he first noted a dull headache that was "all over his head."  Says that it was worse on the left side.  Then over the past 2 days he has noted some "fuzzy" areas in his vision around the 5 o'clock position.  Says that this occurs with both eyes but that there is no pain or "blacked out" areas.  No numbness, tingling, weakness or facial droop.  He is most concerned that he may have had a stroke.  Additionally, he says that he takes glipizide and metformin for his diabetes but his sugars have run in the 300s and 400s despite this.  He does not want to do insulin because it is "too expensive."  Used to be on Jardiance but no longer takes this due to the cost.  Has an upcoming appointment with PCP about this.   Headache Blindness Associated symptoms include headaches.  Head Injury Associated symptoms: headache        Home Medications Prior to Admission medications   Medication Sig Start Date End Date Taking? Authorizing Provider  aspirin 81 MG tablet Take 81 mg by mouth daily.    [provider]  Aspirin-Calcium Carbonate 81-777 MG TABS Take 81 mg by mouth.    [provider]  b complex vitamins tablet Take by mouth.    [provider]  gemfibrozil (LOPID) 600 MG tablet Take 1 tablet (600 mg total) by mouth 2 (two) times daily before a meal. 06/07/17   Jacqulynn Cadet, Domingo Mend, PA  glipiZIDE (GLUCOTROL) 10 MG tablet TAKE 1 TABLET TWICE A DAY  BEFORE MEALS 12/07/16   Jacqulynn Cadet, [place], PA  glucose blood test strip Use as instructed 12/30/15   Harrison Mons, PA  hydrALAZINE (APRESOLINE) 100 MG tablet TAKE 1 TABLET THREE TIMES A DAY 06/07/17   Harrison Mons, PA  HYDROcodone-acetaminophen (NORCO) 5-325 MG tablet Take 1-2 tablets by mouth at bedtime as needed. 03/08/17   Harrison Mons, PA  labetalol (NORMODYNE) 200 MG tablet Take 1 tablet (200 mg total) by mouth 2 (two) times daily. 06/07/17   Harrison Mons, PA  Lancets 30G MISC Use to check home glucose daily and as needed 12/30/15   Harrison Mons, PA  lisinopril (PRINIVIL,ZESTRIL) 40 MG tablet Take 1 tablet (40 mg total) by mouth daily. 06/07/17   Harrison Mons, PA  metFORMIN (GLUCOPHAGE) 500 MG tablet Take 2 tablets (1,000 mg total) by mouth 2 (two) times daily with a meal. 12/07/16   Harrison Mons, PA  metoprolol succinate (TOPROL-XL) 50 MG 24 hr tablet Take 1 tablet (50 mg total) by mouth daily. Take with or immediately following a meal. 06/07/17   Harrison Mons, PA  spironolactone (ALDACTONE) 50 MG tablet Take 1 tablet (50 mg total) by mouth daily. 06/07/17   Harrison Mons, PA      Allergies    Statins and Gabapentin    Review of Systems   Review of Systems  Eyes:  Positive for blindness.  Neurological:  Positive for headaches.    Physical Exam Updated Vital Signs BP (!) 171/76   Pulse 79   Temp 98.4 F (36.9 C)   Resp (!) 21   Ht 5\' 10"  (1.778 m)   Wt 79.4 kg   SpO2 100%   BMI 25.11 kg/m  Physical Exam Vitals and nursing note reviewed.  Constitutional:      Appearance: Normal appearance.  HENT:     Head: Normocephalic and atraumatic.  Eyes:     General: No scleral icterus.    Conjunctiva/sclera: Conjunctivae normal.  Pulmonary:     Effort: Pulmonary effort is normal. No respiratory distress.  Skin:    Findings: No rash.  Neurological:     Mental Status: He is alert.     Comments: Moving all extremities with normal strength of bilateral upper and  lower extremities, no facial droop or aphasia.  No sensation deficit in upper and lower extremities.  EOMs intact and PERRLA bilaterally.  Visual field testing revealing of a visual cut around the 4-6 o'clock positions.  Psychiatric:        Mood and Affect: Mood normal.     ED Results / Procedures / Treatments   Labs (all labs ordered are listed, but only abnormal results are displayed) Labs Reviewed  CBC - Abnormal; Notable for the following components:      Result Value   WBC 11.6 (*)    HCT 36.9 (*)    All other components within normal limits  DIFFERENTIAL - Abnormal; Notable for the following components:   Neutro Abs 7.8 (*)    All other components within normal limits  COMPREHENSIVE METABOLIC PANEL - Abnormal; Notable for the following components:   Sodium 128 (*)    Chloride 97 (*)    CO2 19 (*)    Glucose, Bld 388 (*)    BUN 43 (*)    Creatinine, Ser 1.29 (*)    All other components within normal limits  I-STAT CHEM 8, ED - Abnormal; Notable for the following components:   Sodium 129 (*)    BUN 41 (*)    Creatinine, Ser 1.30 (*)    Glucose, Bld 395 (*)    TCO2 20 (*)    Hemoglobin 12.6 (*)    HCT 37.0 (*)    All other components within normal limits  ETHANOL  PROTIME-INR  APTT  RAPID URINE DRUG SCREEN, HOSP PERFORMED  URINALYSIS, ROUTINE W REFLEX MICROSCOPIC    EKG None  Radiology CT HEAD WO CONTRAST  Result Date: 05/19/2022 CLINICAL DATA:  Altered mental status EXAM: CT HEAD WITHOUT CONTRAST TECHNIQUE: Contiguous axial images were obtained from the base of the skull through the vertex without intravenous contrast. RADIATION DOSE REDUCTION: This exam was performed according to the departmental dose-optimization program which includes automated exposure control, adjustment of the mA and/or kV according to patient size and/or use of iterative reconstruction technique. COMPARISON:  None Available. FINDINGS: Brain: No acute intracranial findings are seen. There are  no signs of bleeding within the cranium. Cortical sulci are prominent. Ventricles are not dilated. Vascular: Unremarkable. Skull: No acute findings are seen. Sinuses/Orbits: There is mild mucosal thickening in ethmoid and right maxillary sinuses. Other: None. IMPRESSION: No acute intracranial findings are seen in noncontrast CT brain. Atrophy. Mild chronic sinusitis. Electronically Signed   By: Elmer Picker M.D.   On: 05/19/2022 16:13    Procedures Procedures  {Document cardiac monitor, telemetry assessment procedure when appropriate:1}  Medications Ordered in ED Medications  lactated ringers bolus 1,000 mL (has no administration in time range)    ED Course/ Medical Decision Making/ A&P   {   Click here for ABCD2, HEART and other calculatorsREFRESH Note before signing :1}                          Medical Decision Making Amount and/or Complexity of Data Reviewed Radiology: ordered.   This is a *** who presents to the ED for concern of ***.    This is not an exhaustive differential.    Past Medical History / Co-morbidities / Social History: ***   Additional history: Per chart review the last time the patient saw his PCP was in January.  Most recent A1c 8.4.  At that time he was reporting blood sugars from 98-150.  Tells me that for the past 3 weeks he has had increased numbers.   Physical Exam: Pertinent physical exam findings include Vision, as noted above  Lab Tests: I ordered, and personally interpreted labs.  The pertinent results include: Original glucose 388 on chemistry.  Sodium listed at 128 however this is corrected to 136. Creatinine 1.29, normal in January   Imaging Studies: Negative Ct head MRI ***   Cardiac Monitoring:  The patient was maintained on a cardiac monitor.  I viewed and interpreted the cardiac monitored which showed an underlying rhythm of: NSR   Medications: I ordered medication including ***. Reevaluation of the patient after these  medicines showed that the patient {resolved/improved/worsened:23923::"improved"}. I have reviewed the patients home medicines and have made adjustments as needed.   Consultations Obtained: I spoke with Dr. Leonel Ramsay with neurology.  He recommends MRI brain without  MDM/Disposition: This is a ***  After patient's work-up today, I feel that *** .     I discussed this case with my attending physician Dr. Marland Kitchen who cosigned this note including patient's presenting symptoms, physical exam, and planned diagnostics and interventions. Attending physician stated agreement with plan or made changes to plan which were implemented.     Final Clinical Impression(s) / ED Diagnoses Final diagnoses:  None    Rx / DC Orders ED Discharge Orders     None

## 2022-05-20 LAB — RAPID URINE DRUG SCREEN, HOSP PERFORMED
Amphetamines: NOT DETECTED
Barbiturates: NOT DETECTED
Benzodiazepines: NOT DETECTED
Cocaine: NOT DETECTED
Opiates: NOT DETECTED
Tetrahydrocannabinol: NOT DETECTED

## 2022-05-24 ENCOUNTER — Encounter: Payer: Self-pay | Admitting: Neurology

## 2022-06-20 ENCOUNTER — Encounter (HOSPITAL_COMMUNITY): Payer: Self-pay

## 2022-06-20 ENCOUNTER — Inpatient Hospital Stay (HOSPITAL_COMMUNITY)
Admission: EM | Admit: 2022-06-20 | Discharge: 2022-07-09 | DRG: 100 | Disposition: A | Discharge status: Skilled Nursing Facility | Payer: Medicare Other | Attending: Internal Medicine | Admitting: Internal Medicine

## 2022-06-20 ENCOUNTER — Other Ambulatory Visit: Payer: Self-pay

## 2022-06-20 DIAGNOSIS — G40909 Epilepsy, unspecified, not intractable, without status epilepticus: Principal | ICD-10-CM | POA: Diagnosis present

## 2022-06-20 DIAGNOSIS — H539 Unspecified visual disturbance: Secondary | ICD-10-CM

## 2022-06-20 DIAGNOSIS — G43909 Migraine, unspecified, not intractable, without status migrainosus: Secondary | ICD-10-CM | POA: Diagnosis present

## 2022-06-20 DIAGNOSIS — E872 Acidosis, unspecified: Secondary | ICD-10-CM | POA: Diagnosis present

## 2022-06-20 DIAGNOSIS — D649 Anemia, unspecified: Secondary | ICD-10-CM | POA: Diagnosis not present

## 2022-06-20 DIAGNOSIS — E1165 Type 2 diabetes mellitus with hyperglycemia: Secondary | ICD-10-CM | POA: Diagnosis present

## 2022-06-20 DIAGNOSIS — Z781 Physical restraint status: Secondary | ICD-10-CM

## 2022-06-20 DIAGNOSIS — R002 Palpitations: Secondary | ICD-10-CM

## 2022-06-20 DIAGNOSIS — Z79899 Other long term (current) drug therapy: Secondary | ICD-10-CM

## 2022-06-20 DIAGNOSIS — R569 Unspecified convulsions: Principal | ICD-10-CM

## 2022-06-20 DIAGNOSIS — K5900 Constipation, unspecified: Secondary | ICD-10-CM | POA: Diagnosis present

## 2022-06-20 DIAGNOSIS — Z8249 Family history of ischemic heart disease and other diseases of the circulatory system: Secondary | ICD-10-CM

## 2022-06-20 DIAGNOSIS — N179 Acute kidney failure, unspecified: Secondary | ICD-10-CM | POA: Diagnosis present

## 2022-06-20 DIAGNOSIS — E781 Pure hyperglyceridemia: Secondary | ICD-10-CM | POA: Diagnosis present

## 2022-06-20 DIAGNOSIS — I10 Essential (primary) hypertension: Secondary | ICD-10-CM

## 2022-06-20 DIAGNOSIS — Z85828 Personal history of other malignant neoplasm of skin: Secondary | ICD-10-CM

## 2022-06-20 DIAGNOSIS — E876 Hypokalemia: Secondary | ICD-10-CM | POA: Diagnosis present

## 2022-06-20 DIAGNOSIS — F05 Delirium due to known physiological condition: Secondary | ICD-10-CM | POA: Diagnosis not present

## 2022-06-20 DIAGNOSIS — I4719 Other supraventricular tachycardia: Secondary | ICD-10-CM | POA: Diagnosis present

## 2022-06-20 DIAGNOSIS — Z833 Family history of diabetes mellitus: Secondary | ICD-10-CM

## 2022-06-20 DIAGNOSIS — E785 Hyperlipidemia, unspecified: Secondary | ICD-10-CM | POA: Diagnosis present

## 2022-06-20 DIAGNOSIS — Z7984 Long term (current) use of oral hypoglycemic drugs: Secondary | ICD-10-CM

## 2022-06-20 DIAGNOSIS — Z789 Other specified health status: Secondary | ICD-10-CM

## 2022-06-20 DIAGNOSIS — I4892 Unspecified atrial flutter: Secondary | ICD-10-CM | POA: Diagnosis not present

## 2022-06-20 DIAGNOSIS — Z888 Allergy status to other drugs, medicaments and biological substances status: Secondary | ICD-10-CM

## 2022-06-20 DIAGNOSIS — E1151 Type 2 diabetes mellitus with diabetic peripheral angiopathy without gangrene: Secondary | ICD-10-CM | POA: Diagnosis present

## 2022-06-20 DIAGNOSIS — E119 Type 2 diabetes mellitus without complications: Secondary | ICD-10-CM

## 2022-06-20 DIAGNOSIS — T50905A Adverse effect of unspecified drugs, medicaments and biological substances, initial encounter: Secondary | ICD-10-CM | POA: Diagnosis present

## 2022-06-20 DIAGNOSIS — G928 Other toxic encephalopathy: Secondary | ICD-10-CM | POA: Diagnosis present

## 2022-06-20 DIAGNOSIS — E269 Hyperaldosteronism, unspecified: Secondary | ICD-10-CM | POA: Diagnosis present

## 2022-06-20 DIAGNOSIS — H547 Unspecified visual loss: Secondary | ICD-10-CM | POA: Diagnosis present

## 2022-06-20 DIAGNOSIS — I739 Peripheral vascular disease, unspecified: Secondary | ICD-10-CM | POA: Diagnosis present

## 2022-06-20 DIAGNOSIS — I471 Supraventricular tachycardia, unspecified: Principal | ICD-10-CM

## 2022-06-20 DIAGNOSIS — Z7982 Long term (current) use of aspirin: Secondary | ICD-10-CM

## 2022-06-20 DIAGNOSIS — Z87891 Personal history of nicotine dependence: Secondary | ICD-10-CM

## 2022-06-20 DIAGNOSIS — R4701 Aphasia: Secondary | ICD-10-CM | POA: Diagnosis not present

## 2022-06-20 DIAGNOSIS — N2889 Other specified disorders of kidney and ureter: Secondary | ICD-10-CM | POA: Diagnosis present

## 2022-06-20 LAB — CBC WITH DIFFERENTIAL/PLATELET
Abs Immature Granulocytes: 0.03 10*3/uL (ref 0.00–0.07)
Basophils Absolute: 0.1 10*3/uL (ref 0.0–0.1)
Basophils Relative: 1 %
Eosinophils Absolute: 0.3 10*3/uL (ref 0.0–0.5)
Eosinophils Relative: 3 %
HCT: 30.6 % — ABNORMAL LOW (ref 39.0–52.0)
Hemoglobin: 10.4 g/dL — ABNORMAL LOW (ref 13.0–17.0)
Immature Granulocytes: 0 %
Lymphocytes Relative: 29 %
Lymphs Abs: 2.6 10*3/uL (ref 0.7–4.0)
MCH: 30.8 pg (ref 26.0–34.0)
MCHC: 34 g/dL (ref 30.0–36.0)
MCV: 90.5 fL (ref 80.0–100.0)
Monocytes Absolute: 0.7 10*3/uL (ref 0.1–1.0)
Monocytes Relative: 8 %
Neutro Abs: 5.4 10*3/uL (ref 1.7–7.7)
Neutrophils Relative %: 59 %
Platelets: 263 10*3/uL (ref 150–400)
RBC: 3.38 MIL/uL — ABNORMAL LOW (ref 4.22–5.81)
RDW: 13.1 % (ref 11.5–15.5)
WBC: 9.1 10*3/uL (ref 4.0–10.5)
nRBC: 0 % (ref 0.0–0.2)

## 2022-06-20 LAB — MAGNESIUM: Magnesium: 2.1 mg/dL (ref 1.7–2.4)

## 2022-06-20 LAB — BASIC METABOLIC PANEL
Anion gap: 11 (ref 5–15)
BUN: 42 mg/dL — ABNORMAL HIGH (ref 8–23)
CO2: 16 mmol/L — ABNORMAL LOW (ref 22–32)
Calcium: 9.3 mg/dL (ref 8.9–10.3)
Chloride: 105 mmol/L (ref 98–111)
Creatinine, Ser: 1.62 mg/dL — ABNORMAL HIGH (ref 0.61–1.24)
GFR, Estimated: 46 mL/min — ABNORMAL LOW (ref >60)
Glucose, Bld: 391 mg/dL — ABNORMAL HIGH (ref 70–99)
Potassium: 4.6 mmol/L (ref 3.5–5.1)
Sodium: 132 mmol/L — ABNORMAL LOW (ref 135–145)

## 2022-06-20 NOTE — ED Triage Notes (Signed)
Pt arrives with c/o blurry vision and heart palpations that started about an hour ago. Pt denies SOB. Pt has hx of SVT.

## 2022-06-20 NOTE — ED Provider Notes (Signed)
[place] EMERGENCY DEPARTMENT AT [facility] Provider Note   CSN: [id number] Arrival date & time: 06/20/22  2154     History  Chief Complaint  Patient presents with   Tachycardia    Kyle [name] is a 67 y.o. male.  67 year old male with past medical history significant for PAD, hypertension, diabetes, SVT presents today for evaluation of palpitations ongoing for the past hour.  He has tried bearing down without success.  Denies chest pain, shortness of breath.  He also reports since recently being seen on 4/3 he has intermittently had vision changes.  He was due to see neurology outpatient but has not been able to see them yet.  His appointment is scheduled for May 16.  Denies headache, other complaints.  His wife who is at bedside states when patient has these episodes of intense vision changes which she describes as lots of flashers and floaters; He is not aware of his surroundings and does not make sense when she tries to speak with him.  The history is provided by the patient. No language interpreter was used.       Home Medications Prior to Admission medications   Medication Sig Start Date End Date Taking? Authorizing Provider  aspirin 81 MG tablet Take 81 mg by mouth daily.    [provider]  Aspirin-Calcium Carbonate 81-777 MG TABS Take 81 mg by mouth.    [provider]  b complex vitamins tablet Take by mouth.    [provider]  gemfibrozil (LOPID) 600 MG tablet Take 1 tablet (600 mg total) by mouth 2 (two) times daily before a meal. 06/07/17   Leotis Shames, Avelino Leeds, PA  glipiZIDE (GLUCOTROL) 10 MG tablet TAKE 1 TABLET TWICE A DAY BEFORE MEALS 12/07/16   Leotis Shames, [place], PA  glucose blood test strip Use as instructed 12/30/15   Porfirio Oar, PA  hydrALAZINE (APRESOLINE) 100 MG tablet TAKE 1 TABLET THREE TIMES A DAY 06/07/17   Porfirio Oar, PA  HYDROcodone-acetaminophen (NORCO) 5-325 MG tablet Take 1-2 tablets by mouth at bedtime as  needed. 03/08/17   Porfirio Oar, PA  labetalol (NORMODYNE) 200 MG tablet Take 1 tablet (200 mg total) by mouth 2 (two) times daily. 06/07/17   Porfirio Oar, PA  Lancets 30G MISC Use to check home glucose daily and as needed 12/30/15   Porfirio Oar, PA  lisinopril (PRINIVIL,ZESTRIL) 40 MG tablet Take 1 tablet (40 mg total) by mouth daily. 06/07/17   Porfirio Oar, PA  metFORMIN (GLUCOPHAGE) 500 MG tablet Take 2 tablets (1,000 mg total) by mouth 2 (two) times daily with a meal. 12/07/16   Porfirio Oar, PA  metoprolol succinate (TOPROL-XL) 50 MG 24 hr tablet Take 1 tablet (50 mg total) by mouth daily. Take with or immediately following a meal. 06/07/17   Porfirio Oar, PA  spironolactone (ALDACTONE) 50 MG tablet Take 1 tablet (50 mg total) by mouth daily. 06/07/17   Porfirio Oar, PA      Allergies    Statins and Gabapentin    Review of Systems   Review of Systems  Constitutional:  Negative for chills and fever.  Eyes:  Positive for visual disturbance.  Respiratory:  Negative for shortness of breath.   Cardiovascular:  Positive for palpitations. Negative for chest pain.  Neurological:  Negative for headaches.  All other systems reviewed and are negative.   Physical Exam Updated Vital Signs BP 126/73   Pulse (!) 101   Resp 19   Wt 79.4 kg  SpO2 96%   BMI 25.11 kg/m  Physical Exam Vitals and nursing note reviewed.  Constitutional:      General: He is not in acute distress.    Appearance: Normal appearance. He is not ill-appearing.  HENT:     Head: Normocephalic and atraumatic.     Nose: Nose normal.  Eyes:     Conjunctiva/sclera: Conjunctivae normal.  Cardiovascular:     Rate and Rhythm: Regular rhythm. Tachycardia present.     Pulses: Normal pulses.  Pulmonary:     Effort: Pulmonary effort is normal. No respiratory distress.     Breath sounds: No wheezing.  Musculoskeletal:        General: No deformity. Normal range of motion.     Cervical back: Normal range  of motion.  Skin:    Findings: No rash.  Neurological:     General: No focal deficit present.     Mental Status: He is alert and oriented to person, place, and time. Mental status is at baseline.     Comments: Cranial nerves III through XII intact.  Full range of motion of bilateral upper and lower extremities with 5/5 strength in extensor and flexor muscle groups.  Without pronator drift.  Pupils equal round reactive to light.     ED Results / Procedures / Treatments   Labs (all labs ordered are listed, but only abnormal results are displayed) Labs Reviewed  CBC WITH DIFFERENTIAL/PLATELET  BASIC METABOLIC PANEL  MAGNESIUM    EKG EKG Interpretation  Date/Time:  Sunday Jun 20 2022 22:04:44 EDT Ventricular Rate:  170 PR Interval:  118 QRS Duration: 92 QT Interval:  273 QTC Calculation: 460 R Axis:   82 Text Interpretation: Supraventricular tachycardia Borderline right axis deviation Minimal ST depression, inferior leads svt new since previous Confirmed by Richardean Canal [phone]) on 06/20/2022 10:58:58 PM  Radiology No results found.  Procedures .Critical Care  Performed by: Marita Kansas, PA-C Authorized by: Marita Kansas, PA-C   Critical care provider statement:    Critical care time (minutes):  31   Critical care was necessary to treat or prevent imminent or life-threatening deterioration of the following conditions: SVT.   Critical care was time spent personally by me on the following activities:  Development of treatment plan with patient or surrogate, discussions with consultants, evaluation of patient's response to treatment, examination of patient, ordering and review of laboratory studies, ordering and review of radiographic studies, ordering and performing treatments and interventions, pulse oximetry, re-evaluation of patient's condition and review of old charts     Medications Ordered in ED Medications - No data to display  ED Course/ Medical Decision Making/ A&P                              Medical Decision Making Amount and/or Complexity of Data Reviewed Labs: ordered.   Medical Decision Making / ED Course   This patient presents to the ED for concern of palpitations, this involves an extensive number of treatment options, and is a complaint that carries with it a high risk of complications and morbidity.  The differential diagnosis includes A-fib, SVT, sinus tach  MDM: 67 year old male presents with concern for palpitation, and visual disturbances.  He was diagnosed with concussion 1 month ago and the vision disturbances started then.  He has not been able to follow-up with neurology outpatient yet.  He did have an MRI during that visit which was without any  acute findings.  Given the description of the episodes when patient has a vision changes and he is not fully aware of his surroundings question if this could be an atypical seizure.  SVT could have been precipitated by this, or vice versa.  Unsure if his episodes over the past month were also associated with SVT.  After performing modified Valsalva maneuvers patient was converted to sinus tach with rates of about 110 from 170s on arrival.  Basic labs ordered.  Discussed with neurology who will evaluate patient.  Patient signed out to Kyle [name] to follow-up on neurology recommendations and labs.  Lab Tests: -I ordered, reviewed, and interpreted labs.   The pertinent results include:   Labs Reviewed  CBC WITH DIFFERENTIAL/PLATELET - Abnormal; Notable for the following components:      Result Value   RBC 3.38 (*)    Hemoglobin 10.4 (*)    HCT 30.6 (*)    All other components within normal limits  BASIC METABOLIC PANEL  MAGNESIUM      EKG  EKG Interpretation  Date/Time:  Sunday Jun 20 2022 22:04:44 EDT Ventricular Rate:  170 PR Interval:  118 QRS Duration: 92 QT Interval:  273 QTC Calculation: 460 R Axis:   82 Text Interpretation: Supraventricular tachycardia Borderline right axis  deviation Minimal ST depression, inferior leads svt new since previous Confirmed by Richardean Canal [phone]) on 06/20/2022 10:58:58 PM        Medicines ordered and prescription drug management: No orders of the defined types were placed in this encounter.   -I have reviewed the patients home medicines and have made adjustments as needed  Critical interventions Converting patient out of SVT  Consultations Obtained: I requested consultation with the neurology,  and discussed lab and imaging findings as well as pertinent plan - they recommend: Please see their note for recommendations   Cardiac Monitoring: The patient was maintained on a cardiac monitor.  I personally viewed and interpreted the cardiac monitored which showed an underlying rhythm of: Initially SVT with rates of 170s improved to about 110 sinus rhythm  Reevaluation: After the interventions noted above, I reevaluated the patient and found that they have :improved  Co morbidities that complicate the patient evaluation  Past Medical History:  Diagnosis Date   Cataracts, bilateral    Diabetes mellitus without complication (HCC)    DM (diabetes mellitus) (HCC)    History of basal cell carcinoma of skin    shoulder   HLD (hyperlipidemia)    Hypertension    Migraines    PAD (peripheral artery disease) (HCC)    Prediabetes       Dispostion: Patient signed out to Kyle [name] to follow-up on neurology recommendation, labs and dispo  Final Clinical Impression(s) / ED Diagnoses Final diagnoses:  SVT (supraventricular tachycardia)  Visual disturbances    Rx / DC Orders ED Discharge Orders     None         Marita Kansas, PA-C 06/20/22 2335    Charlynne Pander, MD 06/23/22 2158

## 2022-06-21 ENCOUNTER — Inpatient Hospital Stay (HOSPITAL_COMMUNITY): Payer: Medicare Other

## 2022-06-21 DIAGNOSIS — D649 Anemia, unspecified: Secondary | ICD-10-CM | POA: Diagnosis not present

## 2022-06-21 DIAGNOSIS — E781 Pure hyperglyceridemia: Secondary | ICD-10-CM | POA: Diagnosis present

## 2022-06-21 DIAGNOSIS — G928 Other toxic encephalopathy: Secondary | ICD-10-CM | POA: Diagnosis not present

## 2022-06-21 DIAGNOSIS — I739 Peripheral vascular disease, unspecified: Secondary | ICD-10-CM | POA: Diagnosis not present

## 2022-06-21 DIAGNOSIS — E269 Hyperaldosteronism, unspecified: Secondary | ICD-10-CM | POA: Diagnosis present

## 2022-06-21 DIAGNOSIS — G43909 Migraine, unspecified, not intractable, without status migrainosus: Secondary | ICD-10-CM

## 2022-06-21 DIAGNOSIS — H539 Unspecified visual disturbance: Secondary | ICD-10-CM

## 2022-06-21 DIAGNOSIS — I4892 Unspecified atrial flutter: Secondary | ICD-10-CM | POA: Diagnosis not present

## 2022-06-21 DIAGNOSIS — E785 Hyperlipidemia, unspecified: Secondary | ICD-10-CM

## 2022-06-21 DIAGNOSIS — I10 Essential (primary) hypertension: Secondary | ICD-10-CM

## 2022-06-21 DIAGNOSIS — N2889 Other specified disorders of kidney and ureter: Secondary | ICD-10-CM | POA: Diagnosis present

## 2022-06-21 DIAGNOSIS — G40909 Epilepsy, unspecified, not intractable, without status epilepticus: Secondary | ICD-10-CM | POA: Diagnosis not present

## 2022-06-21 DIAGNOSIS — E876 Hypokalemia: Secondary | ICD-10-CM | POA: Diagnosis present

## 2022-06-21 DIAGNOSIS — E119 Type 2 diabetes mellitus without complications: Secondary | ICD-10-CM

## 2022-06-21 DIAGNOSIS — R569 Unspecified convulsions: Secondary | ICD-10-CM | POA: Diagnosis not present

## 2022-06-21 DIAGNOSIS — E782 Mixed hyperlipidemia: Secondary | ICD-10-CM | POA: Diagnosis not present

## 2022-06-21 DIAGNOSIS — E1151 Type 2 diabetes mellitus with diabetic peripheral angiopathy without gangrene: Secondary | ICD-10-CM | POA: Diagnosis not present

## 2022-06-21 DIAGNOSIS — R4182 Altered mental status, unspecified: Secondary | ICD-10-CM | POA: Diagnosis not present

## 2022-06-21 DIAGNOSIS — F05 Delirium due to known physiological condition: Secondary | ICD-10-CM | POA: Diagnosis not present

## 2022-06-21 DIAGNOSIS — N179 Acute kidney failure, unspecified: Secondary | ICD-10-CM

## 2022-06-21 DIAGNOSIS — Z8249 Family history of ischemic heart disease and other diseases of the circulatory system: Secondary | ICD-10-CM | POA: Diagnosis not present

## 2022-06-21 DIAGNOSIS — Z87891 Personal history of nicotine dependence: Secondary | ICD-10-CM | POA: Diagnosis not present

## 2022-06-21 DIAGNOSIS — Z7984 Long term (current) use of oral hypoglycemic drugs: Secondary | ICD-10-CM | POA: Diagnosis not present

## 2022-06-21 DIAGNOSIS — I471 Supraventricular tachycardia, unspecified: Secondary | ICD-10-CM

## 2022-06-21 DIAGNOSIS — Z789 Other specified health status: Secondary | ICD-10-CM | POA: Diagnosis not present

## 2022-06-21 DIAGNOSIS — E1165 Type 2 diabetes mellitus with hyperglycemia: Secondary | ICD-10-CM | POA: Diagnosis not present

## 2022-06-21 DIAGNOSIS — I4719 Other supraventricular tachycardia: Secondary | ICD-10-CM | POA: Diagnosis not present

## 2022-06-21 DIAGNOSIS — Z781 Physical restraint status: Secondary | ICD-10-CM | POA: Diagnosis not present

## 2022-06-21 DIAGNOSIS — K5900 Constipation, unspecified: Secondary | ICD-10-CM | POA: Diagnosis present

## 2022-06-21 DIAGNOSIS — Z79899 Other long term (current) drug therapy: Secondary | ICD-10-CM | POA: Diagnosis not present

## 2022-06-21 DIAGNOSIS — Z85828 Personal history of other malignant neoplasm of skin: Secondary | ICD-10-CM | POA: Diagnosis not present

## 2022-06-21 DIAGNOSIS — R4701 Aphasia: Secondary | ICD-10-CM | POA: Diagnosis not present

## 2022-06-21 DIAGNOSIS — E872 Acidosis, unspecified: Secondary | ICD-10-CM | POA: Diagnosis not present

## 2022-06-21 LAB — CBC WITH DIFFERENTIAL/PLATELET
Abs Immature Granulocytes: 0.03 10*3/uL (ref 0.00–0.07)
Basophils Absolute: 0.1 10*3/uL (ref 0.0–0.1)
Basophils Relative: 1 %
Eosinophils Absolute: 0.3 10*3/uL (ref 0.0–0.5)
Eosinophils Relative: 3 %
HCT: 30.5 % — ABNORMAL LOW (ref 39.0–52.0)
Hemoglobin: 10.3 g/dL — ABNORMAL LOW (ref 13.0–17.0)
Immature Granulocytes: 0 %
Lymphocytes Relative: 33 %
Lymphs Abs: 3.3 10*3/uL (ref 0.7–4.0)
MCH: 30.8 pg (ref 26.0–34.0)
MCHC: 33.8 g/dL (ref 30.0–36.0)
MCV: 91.3 fL (ref 80.0–100.0)
Monocytes Absolute: 0.7 10*3/uL (ref 0.1–1.0)
Monocytes Relative: 8 %
Neutro Abs: 5.5 10*3/uL (ref 1.7–7.7)
Neutrophils Relative %: 55 %
Platelets: 257 10*3/uL (ref 150–400)
RBC: 3.34 MIL/uL — ABNORMAL LOW (ref 4.22–5.81)
RDW: 13.2 % (ref 11.5–15.5)
WBC: 9.9 10*3/uL (ref 4.0–10.5)
nRBC: 0 % (ref 0.0–0.2)

## 2022-06-21 LAB — TSH: TSH: 2.143 u[IU]/mL (ref 0.350–4.500)

## 2022-06-21 LAB — CBG MONITORING, ED
Glucose-Capillary: 302 mg/dL — ABNORMAL HIGH (ref 70–99)
Glucose-Capillary: 295 mg/dL — ABNORMAL HIGH (ref 70–99)
Glucose-Capillary: 373 mg/dL — ABNORMAL HIGH (ref 70–99)
Glucose-Capillary: 324 mg/dL — ABNORMAL HIGH (ref 70–99)
Glucose-Capillary: 338 mg/dL — ABNORMAL HIGH (ref 70–99)

## 2022-06-21 LAB — BASIC METABOLIC PANEL
Anion gap: 9 (ref 5–15)
BUN: 39 mg/dL — ABNORMAL HIGH (ref 8–23)
CO2: 18 mmol/L — ABNORMAL LOW (ref 22–32)
Calcium: 9.3 mg/dL (ref 8.9–10.3)
Chloride: 108 mmol/L (ref 98–111)
Creatinine, Ser: 1.31 mg/dL — ABNORMAL HIGH (ref 0.61–1.24)
GFR, Estimated: 59 mL/min — ABNORMAL LOW (ref >60)
Glucose, Bld: 321 mg/dL — ABNORMAL HIGH (ref 70–99)
Potassium: 3.9 mmol/L (ref 3.5–5.1)
Sodium: 135 mmol/L (ref 135–145)

## 2022-06-21 LAB — LIPID PANEL
Cholesterol: 235 mg/dL — ABNORMAL HIGH (ref 0–200)
HDL: 28 mg/dL — ABNORMAL LOW (ref >40)
LDL Cholesterol: UNDETERMINED mg/dL (ref 0–99)
Total CHOL/HDL Ratio: 8.4 RATIO
Triglycerides: 454 mg/dL — ABNORMAL HIGH (ref <150)
VLDL: UNDETERMINED mg/dL (ref 0–40)

## 2022-06-21 LAB — MAGNESIUM: Magnesium: 2.1 mg/dL (ref 1.7–2.4)

## 2022-06-21 LAB — LDL CHOLESTEROL, DIRECT: Direct LDL: 127 mg/dL — ABNORMAL HIGH (ref 0–99)

## 2022-06-21 LAB — HEMOGLOBIN A1C
Hgb A1c MFr Bld: 11.6 % — ABNORMAL HIGH (ref 4.8–5.6)
Mean Plasma Glucose: 286.22 mg/dL

## 2022-06-21 LAB — GLUCOSE, CAPILLARY: Glucose-Capillary: 331 mg/dL — ABNORMAL HIGH (ref 70–99)

## 2022-06-21 MED ORDER — METOPROLOL SUCCINATE ER 25 MG PO TB24: 50.0000 mg | ORAL_TABLET | Freq: Every day | ORAL | Status: DC

## 2022-06-21 MED ORDER — ACETAMINOPHEN 325 MG PO TABS: 650.0000 mg | ORAL_TABLET | Freq: Four times a day (QID) | ORAL | Status: DC | PRN

## 2022-06-21 MED ORDER — INSULIN ASPART 100 UNIT/ML IJ SOLN
0.0000 [IU] | Freq: Three times a day (TID) | INTRAMUSCULAR | Status: DC
  Administered 2022-06-21: 11 [IU] via SUBCUTANEOUS
  Administered 2022-06-21: 15 [IU] via SUBCUTANEOUS
  Administered 2022-06-22: 3 [IU] via SUBCUTANEOUS
  Administered 2022-06-22 (×2): 8 [IU] via SUBCUTANEOUS
  Administered 2022-06-23: 3 [IU] via SUBCUTANEOUS
  Administered 2022-06-23: 2 [IU] via SUBCUTANEOUS
  Administered 2022-06-23 – 2022-06-24 (×2): 8 [IU] via SUBCUTANEOUS
  Administered 2022-06-24 (×2): 5 [IU] via SUBCUTANEOUS
  Administered 2022-06-25: 2 [IU] via SUBCUTANEOUS
  Administered 2022-06-25 (×2): 5 [IU] via SUBCUTANEOUS
  Administered 2022-06-26: 11 [IU] via SUBCUTANEOUS
  Administered 2022-06-26: 3 [IU] via SUBCUTANEOUS
  Administered 2022-06-26 – 2022-06-27 (×2): 8 [IU] via SUBCUTANEOUS
  Administered 2022-06-27: 3 [IU] via SUBCUTANEOUS
  Administered 2022-06-27 – 2022-06-28 (×2): 5 [IU] via SUBCUTANEOUS
  Administered 2022-06-28: 3 [IU] via SUBCUTANEOUS
  Administered 2022-06-28: 11 [IU] via SUBCUTANEOUS
  Administered 2022-06-29 – 2022-06-30 (×3): 3 [IU] via SUBCUTANEOUS
  Administered 2022-06-30: 5 [IU] via SUBCUTANEOUS
  Administered 2022-07-01: 8 [IU] via SUBCUTANEOUS
  Administered 2022-07-01: 3 [IU] via SUBCUTANEOUS
  Administered 2022-07-02: 2 [IU] via SUBCUTANEOUS
  Administered 2022-07-02: 3 [IU] via SUBCUTANEOUS
  Administered 2022-07-03: 2 [IU] via SUBCUTANEOUS
  Administered 2022-07-03 – 2022-07-04 (×2): 3 [IU] via SUBCUTANEOUS
  Administered 2022-07-04 – 2022-07-05 (×2): 2 [IU] via SUBCUTANEOUS
  Administered 2022-07-05: 3 [IU] via SUBCUTANEOUS
  Administered 2022-07-06: 8 [IU] via SUBCUTANEOUS
  Administered 2022-07-06 – 2022-07-07 (×2): 3 [IU] via SUBCUTANEOUS
  Administered 2022-07-07: 5 [IU] via SUBCUTANEOUS
  Administered 2022-07-08: 8 [IU] via SUBCUTANEOUS
  Administered 2022-07-08: 3 [IU] via SUBCUTANEOUS
  Administered 2022-07-09: 8 [IU] via SUBCUTANEOUS
  Administered 2022-07-09: 3 [IU] via SUBCUTANEOUS

## 2022-06-21 MED ORDER — ASPIRIN 81 MG PO TBEC
81.0000 mg | DELAYED_RELEASE_TABLET | Freq: Every day | ORAL | Status: DC
  Administered 2022-06-21 – 2022-07-09 (×19): 81 mg via ORAL
  Filled 2022-06-21 (×19): qty 1

## 2022-06-21 MED ORDER — INSULIN ASPART 100 UNIT/ML IJ SOLN: 2.0000 [IU] | Freq: Three times a day (TID) | INTRAMUSCULAR | Status: DC

## 2022-06-21 MED ORDER — SENNOSIDES-DOCUSATE SODIUM 8.6-50 MG PO TABS
1.0000 | ORAL_TABLET | Freq: Every evening | ORAL | Status: DC | PRN
  Administered 2022-06-25: 1 via ORAL
  Filled 2022-06-21: qty 1

## 2022-06-21 MED ORDER — INSULIN GLARGINE-YFGN 100 UNIT/ML ~~LOC~~ SOLN
5.0000 [IU] | Freq: Every day | SUBCUTANEOUS | Status: DC
  Administered 2022-06-21: 5 [IU] via SUBCUTANEOUS
  Filled 2022-06-21 (×3): qty 0.05

## 2022-06-21 MED ORDER — TOPIRAMATE 100 MG PO TABS
100.0000 mg | ORAL_TABLET | Freq: Two times a day (BID) | ORAL | Status: DC
  Administered 2022-06-21 – 2022-06-23 (×6): 100 mg via ORAL
  Filled 2022-06-21 (×8): qty 1

## 2022-06-21 MED ORDER — ACETAMINOPHEN 650 MG RE SUPP: 650.0000 mg | Freq: Four times a day (QID) | RECTAL | Status: DC | PRN

## 2022-06-21 MED ORDER — SPIRONOLACTONE 12.5 MG HALF TABLET: 50.0000 mg | ORAL_TABLET | Freq: Every day | ORAL | Status: DC

## 2022-06-21 MED ORDER — METOPROLOL TARTRATE 25 MG PO TABS
25.0000 mg | ORAL_TABLET | Freq: Two times a day (BID) | ORAL | Status: DC
  Administered 2022-06-21: 25 mg via ORAL
  Filled 2022-06-21: qty 1

## 2022-06-21 MED ORDER — SODIUM CHLORIDE 0.9 % IV SOLN: INTRAVENOUS | Status: DC

## 2022-06-21 MED ORDER — METOPROLOL TARTRATE 25 MG PO TABS
37.5000 mg | ORAL_TABLET | Freq: Two times a day (BID) | ORAL | Status: DC
  Administered 2022-06-21 – 2022-06-26 (×10): 37.5 mg via ORAL
  Filled 2022-06-21 (×10): qty 1

## 2022-06-21 MED ORDER — SODIUM CHLORIDE 0.9 % IV SOLN: 2000.0000 mg | Freq: Once | INTRAVENOUS | Status: DC

## 2022-06-21 MED ORDER — INSULIN ASPART 100 UNIT/ML IJ SOLN
0.0000 [IU] | Freq: Three times a day (TID) | INTRAMUSCULAR | Status: DC
  Administered 2022-06-21: 7 [IU] via SUBCUTANEOUS

## 2022-06-21 MED ORDER — ENOXAPARIN SODIUM 40 MG/0.4ML IJ SOSY
40.0000 mg | PREFILLED_SYRINGE | INTRAMUSCULAR | Status: DC
  Administered 2022-06-21 – 2022-06-24 (×4): 40 mg via SUBCUTANEOUS
  Filled 2022-06-21 (×5): qty 0.4

## 2022-06-21 MED ORDER — LEVETIRACETAM IN NACL 1000 MG/100ML IV SOLN
1000.0000 mg | INTRAVENOUS | Status: AC
  Administered 2022-06-21 (×2): 1000 mg via INTRAVENOUS
  Filled 2022-06-21 (×2): qty 100

## 2022-06-21 MED ORDER — METOPROLOL TARTRATE 25 MG PO TABS: 12.5000 mg | ORAL_TABLET | Freq: Two times a day (BID) | ORAL | Status: DC

## 2022-06-21 MED ORDER — MIDAZOLAM HCL 2 MG/2ML IJ SOLN
2.0000 mg | Freq: Once | INTRAMUSCULAR | Status: AC
  Administered 2022-06-21: 2 mg via INTRAVENOUS
  Filled 2022-06-21: qty 2

## 2022-06-21 MED ORDER — HYDRALAZINE HCL 100 MG PO TABS: 100.0000 mg | ORAL_TABLET | Freq: Three times a day (TID) | ORAL | Status: DC

## 2022-06-21 MED ORDER — INSULIN ASPART 100 UNIT/ML IJ SOLN
0.0000 [IU] | Freq: Every day | INTRAMUSCULAR | Status: DC
  Administered 2022-06-21: 4 [IU] via SUBCUTANEOUS
  Administered 2022-06-22 – 2022-06-23 (×2): 2 [IU] via SUBCUTANEOUS
  Administered 2022-06-24: 3 [IU] via SUBCUTANEOUS
  Administered 2022-06-26 – 2022-07-04 (×4): 2 [IU] via SUBCUTANEOUS
  Administered 2022-07-05: 3 [IU] via SUBCUTANEOUS
  Administered 2022-07-08: 2 [IU] via SUBCUTANEOUS

## 2022-06-21 MED ORDER — LEVETIRACETAM 500 MG PO TABS
500.0000 mg | ORAL_TABLET | Freq: Two times a day (BID) | ORAL | Status: DC
  Administered 2022-06-21 – 2022-06-22 (×3): 500 mg via ORAL
  Filled 2022-06-21 (×3): qty 1

## 2022-06-21 MED ORDER — GEMFIBROZIL 600 MG PO TABS
600.0000 mg | ORAL_TABLET | Freq: Two times a day (BID) | ORAL | Status: DC
  Administered 2022-06-21 – 2022-07-09 (×34): 600 mg via ORAL
  Filled 2022-06-21 (×39): qty 1

## 2022-06-21 NOTE — Consult Note (Signed)
Cardiology Consultation   Patient ID: Kyle [name] MRN: [id number]; DOB: 19-Nov-1955  Admit date: 06/20/2022 Date of Consult: 06/21/2022  PCP:  No primary care provider on file.   [facility] Providers Cardiologist:  None        Patient Profile:   Kyle [name] is a 67 y.o. male with a hx of DM type II, hyperlipidemia, hypertension, PAD, migraines, palpitaitons who is being seen 06/21/2022 for the evaluation of SVT at the request of Dr. Waymon Amato.  History of Present Illness:  HPI on my exam today limited. Patient slow to respond to questions, drifts off during responses. Supplemental HPI from patient's spouse.   Kyle [name] presented to the ED on the evening on 5/5 with symptoms of palpitations, vision changes, lightheadedness/dizziness. Patient had attempted vagal maneuvers at home without success. In the last month, patient's spouse reported that he had been experiencing episodes of vision changes with flashes/floaters as well as loss of awareness of surroundings and garbled speech. Of note, patient seen about a month ago with diagnosis of concussion following mild head trauma prior to these vision changes. During evaluation on 4/3, patient had brain MRI which was negative. Patient also had an episode of SVT during this visit. Appears that patient was set up to be seen by outpatient cardiology, neuro, and ophthalmology but hadn't yet been seen.   In the ED this admission, patient's initial HR noted to be in the 170s. He completed modified Valsalva maneuvers with ED staff and had rates decrease into the 110s. Around midnight, patient began to report recurrence of visual disturbances/flashing light sensation and neurology was asked to see patient. On their exam, patient noted to have intermittent slow verbal response, variable right gaze. Neurology felt symptoms were concerning for focal seizure. Review of 4/3 showed concern for possible seizure focus. Patient loaded on  Keppra. Topirimate continued at home dose. Placed on EEG this morning.  Patient's spouse today says that patient has complained of hx of intermittent palpitations for several years. Has worn a heart in remote past   Past Medical History:  Diagnosis Date   Cataracts, bilateral    Diabetes mellitus without complication (HCC)    DM (diabetes mellitus) (HCC)    History of basal cell carcinoma of skin    shoulder   HLD (hyperlipidemia)    Hypertension    Migraines    PAD (peripheral artery disease) (HCC)    Prediabetes     Past Surgical History:  Procedure Laterality Date   SPINE SURGERY  1994   TONSILLECTOMY  1961     Home Medications:  Prior to Admission medications   Medication Sig Start Date End Date Taking? Authorizing Provider  aspirin 81 MG tablet Take 81 mg by mouth daily.    [provider]  Aspirin-Calcium Carbonate 81-777 MG TABS Take 81 mg by mouth.    [provider]  b complex vitamins tablet Take by mouth.    [provider]  gemfibrozil (LOPID) 600 MG tablet Take 1 tablet (600 mg total) by mouth 2 (two) times daily before a meal. 06/07/17   Leotis Shames, Avelino Leeds, PA  glipiZIDE (GLUCOTROL) 10 MG tablet TAKE 1 TABLET TWICE A DAY BEFORE MEALS 12/07/16   Leotis Shames, [place], PA  glucose blood test strip Use as instructed 12/30/15   Porfirio Oar, PA  hydrALAZINE (APRESOLINE) 100 MG tablet TAKE 1 TABLET THREE TIMES A DAY 06/07/17   Porfirio Oar, PA  HYDROcodone-acetaminophen (NORCO) 5-325 MG tablet Take 1-2 tablets by mouth  at bedtime as needed. 03/08/17   Porfirio Oar, PA  labetalol (NORMODYNE) 200 MG tablet Take 1 tablet (200 mg total) by mouth 2 (two) times daily. 06/07/17   Porfirio Oar, PA  Lancets 30G MISC Use to check home glucose daily and as needed 12/30/15   Porfirio Oar, PA  lisinopril (PRINIVIL,ZESTRIL) 40 MG tablet Take 1 tablet (40 mg total) by mouth daily. 06/07/17   Porfirio Oar, PA  metFORMIN (GLUCOPHAGE) 500 MG tablet Take 2  tablets (1,000 mg total) by mouth 2 (two) times daily with a meal. 12/07/16   Porfirio Oar, PA  metoprolol succinate (TOPROL-XL) 50 MG 24 hr tablet Take 1 tablet (50 mg total) by mouth daily. Take with or immediately following a meal. 06/07/17   Porfirio Oar, PA  spironolactone (ALDACTONE) 50 MG tablet Take 1 tablet (50 mg total) by mouth daily. 06/07/17   Porfirio Oar, PA    Inpatient Medications: Scheduled Meds:  aspirin EC  81 mg Oral Daily   enoxaparin (LOVENOX) injection  40 mg Subcutaneous Q24H   gemfibrozil  600 mg Oral BID AC   insulin aspart  0-15 Units Subcutaneous TID WC   insulin aspart  0-5 Units Subcutaneous QHS   insulin aspart  2 Units Subcutaneous TID WC   insulin glargine-yfgn  5 Units Subcutaneous Daily   levETIRAcetam  500 mg Oral BID   metoprolol tartrate  25 mg Oral BID   topiramate  100 mg Oral BID   Continuous Infusions:  sodium chloride 75 mL/hr at 06/21/22 0841   PRN Meds: acetaminophen **OR** acetaminophen, senna-docusate  Allergies:    Allergies  Allergen Reactions   Statins     Leg cramps   Gabapentin Palpitations    Social History:   Social History   Socioeconomic History   Marital status: Married    Spouse name: Kyle [name]   Number of children: 2   Years of education: GED   Highest education level: Not on file  Occupational History   Occupation: retired 07/2015    Comment: former truck Hospital doctor, Museum/gallery exhibitions officer   Occupation: drives cars    Comment: [employer], Wednesdays   Occupation: Parts Delivery    Comment: [employer], 3 days/week  Tobacco Use   Smoking status: Former    Types: Cigarettes    Start date: 11/05/2007   Smokeless tobacco: Never  Substance and Sexual Activity   Alcohol use: No    Alcohol/week: 0.0 standard drinks of alcohol   Drug use: No   Sexual activity: Yes    Partners: Female    Birth control/protection: None  Other Topics Concern   Not on file  Social History Narrative   Lives with  his wife and their pets.   Daughters live in [place] and [place].   Son lives in [place].   Enjoying doing work around the house and yard.   Is thinking about part time work at some time.   Social Determinants of Health   Financial Resource Strain: Not on file  Food Insecurity: Not on file  Transportation Needs: Not on file  Physical Activity: Not on file  Stress: Not on file  Social Connections: Not on file  Intimate Partner Violence: Not on file    Family History:    Family History  Problem Relation Age of Onset   Diabetes Father    Hypertension Father    Hypertension Mother    Hypertension Sister    Diabetes Sister  diet controlled     ROS:  Please see the history of present illness.   All other ROS reviewed and negative.     Physical Exam/Data:   Vitals:   06/21/22 1049 06/21/22 1115 06/21/22 1130 06/21/22 1200  BP:  120/64 124/65 109/61  Pulse:  63 64 64  Resp:  14 13 10   Temp: 97.8 F (36.6 C)     TempSrc: Oral     SpO2:  97% 96% 95%  Weight:      Height:       No intake or output data in the 24 hours ending 06/21/22 1236    06/21/2022    3:20 AM 06/20/2022   10:10 PM 05/19/2022    2:46 PM  Last 3 Weights  Weight (lbs) 174 lb 13.2 oz 175 lb 175 lb  Weight (kg) 79.3 kg 79.379 kg 79.379 kg     Body mass index is 25.08 kg/m.  General:  Well nourished, well developed, in no acute distress. Slow/limited response to questions HEENT: normal Neck: no JVD Vascular: No carotid bruits; Distal pulses 2+ bilaterally Cardiac:  normal S1, S2; RRR; no murmur  Lungs:  clear to auscultation bilaterally, no wheezing, rhonchi or rales  Abd: soft, nontender, no hepatomegaly  Ext: no edema Musculoskeletal:  No deformities, BUE and BLE strength normal and equal Skin: warm and dry  Neuro:  CNs 2-12 intact, no focal abnormalities noted Psych:  Slow/limited response to questions  EKG:  The EKG was personally reviewed and demonstrates:  ECG from overnight  demonstrates sinus tachycardia/SVT at rate of 170. QRS interval is regular Telemetry:  Telemetry was personally reviewed and demonstrates:  telemetry shows sinus rhythm. No evidence of recurrent tachycardia  Relevant CV Studies:   Laboratory Data:  High Sensitivity Troponin:  No results for input(s): "TROPONINIHS" in the last 720 hours.   Chemistry Recent Labs  Lab 06/20/22 2322 06/21/22 0328  NA 132* 135  K 4.6 3.9  CL 105 108  CO2 16* 18*  GLUCOSE 391* 321*  BUN 42* 39*  CREATININE 1.62* 1.31*  CALCIUM 9.3 9.3  MG 2.1 2.1  GFRNONAA 46* 59*  ANIONGAP 11 9    No results for input(s): "PROT", "ALBUMIN", "AST", "ALT", "ALKPHOS", "BILITOT" in the last 168 hours. Lipids No results for input(s): "CHOL", "TRIG", "HDL", "LABVLDL", "LDLCALC", "CHOLHDL" in the last 168 hours.  Hematology Recent Labs  Lab 06/20/22 2322 06/21/22 0328  WBC 9.1 9.9  RBC 3.38* 3.34*  HGB 10.4* 10.3*  HCT 30.6* 30.5*  MCV 90.5 91.3  MCH 30.8 30.8  MCHC 34.0 33.8  RDW 13.1 13.2  PLT 263 257   Thyroid  Recent Labs  Lab 06/21/22 0328  TSH 2.143    BNPNo results for input(s): "BNP", "PROBNP" in the last 168 hours.  DDimer No results for input(s): "DDIMER" in the last 168 hours.   Radiology/Studies:  No results found.   Assessment and Plan:   Paroxysmal SVT  Patient presented to the ED overnight with HR in the 170s, reporting sensation of palpitations but also visual disturbances. Tachycardia resolved with valsalva maneuvers and patient also found to be having likely focal seizures.  Suspect that patient might be having tachycardia in peri-ictal period following seizures. No recurrent arrhythmias overnight-through today.  Agree with echocardiogram Per pharmacy fill records, patient has been taking Labetalol 100mg  TID rather than Metoprolol that is also listed in PTA med list.  Would continue metoprolol tartrate 25mg  BID over labetalol as this is more likely  to provide rate control.  Consider consolidating to Succinate with PRN lopressor for breakthrough SVT. Will plan to arrange 30 day heart monitor at discharge to further assess arrhythmia burden, especially now that patient is on anti-seizure regimen.  Check serum metanephrines  Hyperlipidemia  Patient historically intolerant to statins. Lipid panel ordered. Would consider Zetia and lipid clinic referral if elevated.   Hypertension Hx hyperaldosteronism  BP generally stable this admission. Continue Spironolactone 50mg , Hydralazine. Also takes Lisinopril 40mg  at home but this has been held. Consider resuming at lower dose, 20mg .      Risk Assessment/Risk Scores:                For questions or updates, please contact [facility] Please consult [email or web address] for contact info under    Signed, Perlie Gold, PA-C  06/21/2022 12:36 PM

## 2022-06-21 NOTE — ED Notes (Signed)
Pt began having visual disturbances while this RN was in the room assisting him to the restroom. Pt's eyes started moving around erratically, pt stated he was seeing flashing lights and had to sit back down. This lasted for approximately 2 minutes. Per the family member at bedside this has happened 5 times since they have been here. Will continue to monitor.

## 2022-06-21 NOTE — Progress Notes (Signed)
Pt moved to 6E27. Reconnected to the LTM EEG pt event button is broken will have to place ticket to have fixed. [facility] monitored.

## 2022-06-21 NOTE — Progress Notes (Signed)
LTM EEG running - no initial skin breakdown - push button tested.

## 2022-06-21 NOTE — Progress Notes (Addendum)
PROGRESS NOTE   Kyle [name]  ZOX:[id number]    DOB: [birth year]    DOA: 06/20/2022  PCP: Porfirio Oar, PA   I have briefly reviewed patients previous medical records in [facility].  Chief Complaint  Patient presents with   Tachycardia    Brief Narrative:  67 year old married male, independent, medical history significant for type II DM, HTN, HLD, PAD, recently seen in the ED on 05/19/2022 for complaints of visual disturbance x 2 days, unremarkable neurological exam/CT head/MRI brain, seen by neurology who recommended outpatient follow-up, also had episode of SVT with plans for outpatient cardiology follow-up, presented again to the ED on 06/20/2022 with complaints of palpitations, lightheadedness, dizziness, presyncopal.  He was found to be in SVT with heart rate of 177 which self terminated.  In the ED, also noted to have seizure-like activity for which neurology consulted.   Assessment & Plan:  Principal Problem:   Focal seizures (HCC) Active Problems:   Hypertension   Diabetes (HCC)   Hyperlipidemia   PAD (peripheral artery disease) (HCC)   Seizure (HCC)   Migraines   Possible focal seizures: In ED, nursing noted patient having episodes of eyes deviating to the right with some decreased mentation.  Details of seizure-like activity as documented in H&P by Dr. Gery Pray.  Reportedly occurring since April, multiple times a day, associated right arm weakness, incoherence see and unsteadiness.  PCP started Topamax 4/24 without improvement.  Has ophthalmology appointment 5/16.  Neurology consultation appreciated.  Suspect seizures refractory to topiramate at max dose.  Seizures potentially aggravated by poorly controlled DM.  MRI brain 4/3 suspected to have possible seizure focus that would correlate well with his current symptoms.  Loaded with Keppra 2 g followed by 500 Mg twice daily, continuing topiramate 100 Mg twice daily, aim for better DM control, MRI brain with and  without contrast if refractory to Keppra and glycemic control, EEG.  Neurology will follow.  Patient has been instructed that he should not drive x 6 months.  Paroxysmal SVT: Admission EKG showed SVT at 170.  Telemetry has not captured any episodes since admission.  As per H&P, he has had the symptoms since he was a youngster, has had a 30-day Holter monitor in the remote past which was unrevealing, palpitations have been occurring with increased frequencies as he has gotten older.  No recent echo, ordered.  Last TTE in 2014 showed normal EF.  Continue telemetry.  TSH normal at 2.143.  Cardiology was consulted, input pending.  Continue metoprolol 25 Mg twice daily.    Acute kidney injury: Creatinine in Care Everywhere 0.95 in January.  Presented with creatinine of 1.62.  Lisinopril held.  IV fluids.  Creatinine is improved to 1.3.  Follow BMP in AM.  Poorly controlled type II DM with hyperglycemia: A1c 11.6.  DM coordinator consulted.  Will likely need insulins at discharge.  Will start on Semglee 5 units daily, changed SSI to moderate with bedtime scale and added mealtime NovoLog 2 units.  Titrate up insulins as needed.  Hypertension Controlled.  Lisinopril held due to AKI.  Metoprolol 25 Mg twice daily started.  Hyperlipidemia Continue gemfibrozil.  Normocytic anemia: Baseline hemoglobin in the 12 g range.  Unclear etiology.?  Dilutional.  Follow CBC in AM.   Body mass index is 25.08 kg/m.   ACP Documents: None present DVT prophylaxis: enoxaparin (LOVENOX) injection 40 mg Start: 06/21/22 1000     Code Status: Full Code:  Family Communication: Discussed with spouse via  phone. Per spouse, she saw him in ED this am and appeared drowsy and noted that his eyes were going to the right. Disposition:  Status is: Inpatient Remains inpatient appropriate because: Further evaluation and management of suspected seizures, PSVT and poorly controlled DM.     Consultants:    Neurology Cardiology  Procedures:     Antimicrobials:      Subjective:  Limited/poor historian.  Not sure if this is his baseline or is due to AMS.  Will need input from his family/spouse.  States that he lives with his wife, independent, currently does not drive.  Feels better with less "flashing of light" in front of his eyes.  Also denies palpitations which she said he had on arrival.  Objective:   Vitals:   06/21/22 0515 06/21/22 0700 06/21/22 0715 06/21/22 0730  BP: 120/72 128/80 127/71 120/68  Pulse: 76 84 76 74  Resp: 13 11 10 11   Temp:      TempSrc:      SpO2: 94% 97% 96% 96%  Weight:      Height:        General exam: Middle-age male, looks older than stated age, moderately built and nourished lying comfortably propped up in bed without distress. Respiratory system: Clear to auscultation. Respiratory effort normal. Cardiovascular system: S1 & S2 heard, RRR. No JVD, murmurs, rubs, gallops or clicks. No pedal edema.  Telemetry personally reviewed: Sinus rhythm. Gastrointestinal system: Abdomen is nondistended, soft and nontender. No organomegaly or masses felt. Normal bowel sounds heard. Central nervous system: Alert and oriented. No focal neurological deficits. Extremities: Symmetric 5 x 5 power. Skin: No rashes, lesions or ulcers Psychiatry: Judgement and insight appear impaired. Mood & affect appropriate.     Data Reviewed:   I have personally reviewed following labs and imaging studies   CBC: Recent Labs  Lab 06/20/22 2322 06/21/22 0328  WBC 9.1 9.9  NEUTROABS 5.4 5.5  HGB 10.4* 10.3*  HCT 30.6* 30.5*  MCV 90.5 91.3  PLT 263 257    Basic Metabolic Panel: Recent Labs  Lab 06/20/22 2322 06/21/22 0328  NA 132* 135  K 4.6 3.9  CL 105 108  CO2 16* 18*  GLUCOSE 391* 321*  BUN 42* 39*  CREATININE 1.62* 1.31*  CALCIUM 9.3 9.3  MG 2.1 2.1    Liver Function Tests: No results for input(s): "AST", "ALT", "ALKPHOS", "BILITOT", "PROT", "ALBUMIN"  in the last 168 hours.  CBG: Recent Labs  Lab 06/21/22 0150 06/21/22 0756  GLUCAP 302* 295*    Microbiology Studies:  No results found for this or any previous visit (from the past 240 hour(s)).  Radiology Studies:  No results found.  Scheduled Meds:    aspirin EC  81 mg Oral Daily   enoxaparin (LOVENOX) injection  40 mg Subcutaneous Q24H   gemfibrozil  600 mg Oral BID AC   insulin aspart  0-9 Units Subcutaneous TID WC   levETIRAcetam  500 mg Oral BID   metoprolol tartrate  25 mg Oral BID   topiramate  100 mg Oral BID    Continuous Infusions:    sodium chloride 75 mL/hr at 06/21/22 0437     LOS: 0 days     Marcellus Scott, MD,  FACP, [facility], [facility], [facility], [facility]   [facility] [place]     To contact the attending provider between 7A-7P or the covering provider during after hours 7P-7A, please log into the web site [email or web address] and access using universal  [place] password for that web site. If you do not have the password, please call the hospital operator.  06/21/2022, 8:05 AM

## 2022-06-21 NOTE — Inpatient Diabetes Management (Signed)
Inpatient Diabetes Program Recommendations  AACE/ADA: New Consensus Statement on Inpatient Glycemic Control (2015)  Target Ranges:  Prepandial:   less than 140 mg/dL      Peak postprandial:   less than 180 mg/dL (1-2 hours)      Critically ill patients:  140 - 180 mg/dL   Lab Results  Component Value Date   GLUCAP 373 (H) 06/21/2022   HGBA1C 11.6 (H) 06/21/2022    Review of Glycemic Control  Diabetes history: DM 2 Outpatient Diabetes medications: Glipizide and metformin  Current orders for Inpatient glycemic control:  Novolog 0-15 units tid + hs Novolog 2 units tid meal coverage Semglee 5 units Daily  A1c 11.6% on 5/6  Inpatient Diabetes Program Recommendations:    -   Increase Semglee to 10 units  Spoke with pt and wife at bedside regarding A1c of 11.6% and glucose control at home. Pt reports taking his metformin and glipizide all the time cna checking his glucose first thing in the mornings. He usually sees glucose levels around 150 in the mornings but within the last 3 weeks he has seen levels that are higher. Discussed current A1c level. Discussed glucose and A1c goals. Discussed with pt the possible need to start insulin at time of discharge and ti would possibly consist of once daily basal insulin injection. Showed pt how to operate the insulin pen. Encouraged pt to follow up with PCP and to speak with the Hospitalist doctor here about the uses of insulin at home. Will see pt again tomorrow and attach education to pt paperwork for discharge.   Thanks,  Christena Deem RN, MSN, BC-ADM Inpatient Diabetes Coordinator Team Pager [phone] (8a-5p)

## 2022-06-21 NOTE — ED Notes (Signed)
ED TO INPATIENT HANDOFF REPORT  ED Nurse Name and Phone #: Jesse Sans, [phone]  S Name/Age/Gender Kyle [name] 67 y.o. male Room/Bed: 008C/008C  Code Status   Code Status: Full Code  Home/SNF/Other Home Patient oriented to: self, place, time, and situation Is this baseline? Yes   Triage Complete: Triage complete  Chief Complaint Focal seizures (HCC) [R56.9]  Triage Note Pt arrives with c/o blurry vision and heart palpations that started about an hour ago. Pt denies SOB. Pt has hx of SVT.    Allergies Allergies  Allergen Reactions   Statins     Leg cramps   Gabapentin Palpitations    Level of Care/Admitting Diagnosis ED Disposition     ED Disposition  Admit   Condition  --   Comment  Hospital Area: [facility] [[id number]]  Level of Care: Telemetry Cardiac [[id number]]  May admit patient to [facility] or [facility] if equivalent level of care is available:: No  Covid Evaluation: Confirmed COVID Negative  Diagnosis: Focal seizures (HCC) [454098]  Admitting Physician: Gery Pray [[id number]]  Attending Physician: Gery Pray [[id number]]  Certification:: I certify this patient will need inpatient services for at least 2 midnights  Estimated Length of Stay: 2          B Medical/Surgery History Past Medical History:  Diagnosis Date   Cataracts, bilateral    Diabetes mellitus without complication (HCC)    DM (diabetes mellitus) (HCC)    History of basal cell carcinoma of skin    shoulder   HLD (hyperlipidemia)    Hypertension    Migraines    PAD (peripheral artery disease) (HCC)    Prediabetes    Past Surgical History:  Procedure Laterality Date   SPINE SURGERY  1994   TONSILLECTOMY  1961     A IV Location/Drains/Wounds Patient Lines/Drains/Airways Status     Active Line/Drains/Airways     Name Placement date Placement time Site Days   Peripheral IV 06/20/22 18 G Right Antecubital 06/20/22  2212  Antecubital  1             Intake/Output Last 24 hours No intake or output data in the 24 hours ending 06/21/22 1628  Labs/Imaging Results for orders placed or performed during the hospital encounter of 06/20/22 (from the past 48 hour(s))  CBC with Differential     Status: Abnormal   Collection Time: 06/20/22 11:22 PM  Result Value Ref Range   WBC 9.1 4.0 - 10.5 K/uL   RBC 3.38 (L) 4.22 - 5.81 MIL/uL   Hemoglobin 10.4 (L) 13.0 - 17.0 g/dL   HCT 11.9 (L) 14.7 - 82.9 %   MCV 90.5 80.0 - 100.0 fL   MCH 30.8 26.0 - 34.0 pg   MCHC 34.0 30.0 - 36.0 g/dL   RDW 56.2 13.0 - 86.5 %   Platelets 263 150 - 400 K/uL   nRBC 0.0 0.0 - 0.2 %   Neutrophils Relative % 59 %   Neutro Abs 5.4 1.7 - 7.7 K/uL   Lymphocytes Relative 29 %   Lymphs Abs 2.6 0.7 - 4.0 K/uL   Monocytes Relative 8 %   Monocytes Absolute 0.7 0.1 - 1.0 K/uL   Eosinophils Relative 3 %   Eosinophils Absolute 0.3 0.0 - 0.5 K/uL   Basophils Relative 1 %   Basophils Absolute 0.1 0.0 - 0.1 K/uL   Immature Granulocytes 0 %   Abs Immature Granulocytes 0.03 0.00 - 0.07 K/uL    Comment: Performed  at [facility] [place] [facility], [address], [place], [place] [address]  Basic metabolic panel     Status: Abnormal   Collection Time: 06/20/22 11:22 PM  Result Value Ref Range   Sodium 132 (L) 135 - 145 mmol/L   Potassium 4.6 3.5 - 5.1 mmol/L   Chloride 105 98 - 111 mmol/L   CO2 16 (L) 22 - 32 mmol/L   Glucose, Bld 391 (H) 70 - 99 mg/dL    Comment: Glucose reference range applies only to samples taken after fasting for at least 8 hours.   BUN 42 (H) 8 - 23 mg/dL   Creatinine, Ser 6.04 (H) 0.61 - 1.24 mg/dL   Calcium 9.3 8.9 - 54.0 mg/dL   GFR, Estimated 46 (L) >60 mL/min    Comment: (NOTE) Calculated using the CKD-EPI Creatinine Equation (2021)    Anion gap 11 5 - 15    Comment: Performed at [facility], [address], [place], [place] [address]  Magnesium     Status: None   Collection Time: 06/20/22 11:22 PM  Result Value Ref Range   Magnesium 2.1  1.7 - 2.4 mg/dL    Comment: Performed at [facility], [address] [place], [place] [address]  CBG monitoring, ED     Status: Abnormal   Collection Time: 06/21/22  1:50 AM  Result Value Ref Range   Glucose-Capillary 302 (H) 70 - 99 mg/dL    Comment: Glucose reference range applies only to samples taken after fasting for at least 8 hours.  Hemoglobin A1c     Status: Abnormal   Collection Time: 06/21/22  3:13 AM  Result Value Ref Range   Hgb A1c MFr Bld 11.6 (H) 4.8 - 5.6 %    Comment: (NOTE) Pre diabetes:          5.7%-6.4%  Diabetes:              >6.4%  Glycemic control for   <7.0% adults with diabetes    Mean Plasma Glucose 286.22 mg/dL    Comment: Performed at [facility], [address], [place], [place] [address]  TSH     Status: None   Collection Time: 06/21/22  3:28 AM  Result Value Ref Range   TSH 2.143 0.350 - 4.500 uIU/mL    Comment: Performed by a 3rd Generation assay with a functional sensitivity of <=0.01 uIU/mL. Performed at [facility], [address] [place], [place] [address]   Basic metabolic panel     Status: Abnormal   Collection Time: 06/21/22  3:28 AM  Result Value Ref Range   Sodium 135 135 - 145 mmol/L   Potassium 3.9 3.5 - 5.1 mmol/L   Chloride 108 98 - 111 mmol/L   CO2 18 (L) 22 - 32 mmol/L   Glucose, Bld 321 (H) 70 - 99 mg/dL    Comment: Glucose reference range applies only to samples taken after fasting for at least 8 hours.   BUN 39 (H) 8 - 23 mg/dL   Creatinine, Ser 7.84 (H) 0.61 - 1.24 mg/dL   Calcium 9.3 8.9 - 69.6 mg/dL   GFR, Estimated 59 (L) >60 mL/min    Comment: (NOTE) Calculated using the CKD-EPI Creatinine Equation (2021)    Anion gap 9 5 - 15    Comment: Performed at [facility], [address], [place], [place] [address]  Magnesium     Status: None   Collection Time: 06/21/22  3:28 AM  Result Value Ref Range   Magnesium 2.1 1.7 - 2.4 mg/dL    Comment: Performed at [facility], [address], [place], [place] [address]  CBC with Differential/Platelet     Status: Abnormal   Collection Time: 06/21/22  3:28 AM  Result Value Ref Range   WBC 9.9 4.0 - 10.5 K/uL   RBC 3.34 (L) 4.22 - 5.81 MIL/uL   Hemoglobin 10.3 (L) 13.0 - 17.0 g/dL   HCT 91.4 (L) 78.2 - 95.6 %   MCV 91.3 80.0 - 100.0 fL   MCH 30.8 26.0 - 34.0 pg   MCHC 33.8 30.0 - 36.0 g/dL   RDW 21.3 08.6 - 57.8 %   Platelets 257 150 - 400 K/uL   nRBC 0.0 0.0 - 0.2 %   Neutrophils Relative % 55 %   Neutro Abs 5.5 1.7 - 7.7 K/uL   Lymphocytes Relative 33 %   Lymphs Abs 3.3 0.7 - 4.0 K/uL   Monocytes Relative 8 %   Monocytes Absolute 0.7 0.1 - 1.0 K/uL   Eosinophils Relative 3 %   Eosinophils Absolute 0.3 0.0 - 0.5 K/uL   Basophils Relative 1 %   Basophils Absolute 0.1 0.0 - 0.1 K/uL   Immature Granulocytes 0 %   Abs Immature Granulocytes 0.03 0.00 - 0.07 K/uL    Comment: Performed at [facility], [address] [place], [place] [address]  CBG monitoring, ED     Status: Abnormal   Collection Time: 06/21/22  7:56 AM  Result Value Ref Range   Glucose-Capillary 295 (H) 70 - 99 mg/dL    Comment: Glucose reference range applies only to samples taken after fasting for at least 8 hours.   Comment 1 Notify RN   CBG monitoring, ED     Status: Abnormal   Collection Time: 06/21/22 11:16 AM  Result Value Ref Range   Glucose-Capillary 373 (H) 70 - 99 mg/dL    Comment: Glucose reference range applies only to samples taken after fasting for at least 8 hours.  Lipid panel     Status: Abnormal   Collection Time: 06/21/22  1:40 PM  Result Value Ref Range   Cholesterol 235 (H) 0 - 200 mg/dL   Triglycerides 952 (H) <150 mg/dL   HDL 28 (L) >84 mg/dL   Total CHOL/HDL Ratio 8.4 RATIO   VLDL UNABLE TO CALCULATE IF TRIGLYCERIDE OVER 400 mg/dL 0 - 40 mg/dL   LDL Cholesterol UNABLE TO CALCULATE IF TRIGLYCERIDE OVER 400 mg/dL 0 - 99 mg/dL    Comment:        Total Cholesterol/HDL:CHD Risk Coronary Heart Disease Risk Table                      Men   Women  1/2 Average Risk   3.4   3.3  Average Risk       5.0   4.4  2 X Average Risk   9.6   7.1  3 X Average Risk  23.4   11.0        Use the calculated Patient Ratio above and the CHD Risk Table to determine the patient's CHD Risk.        ATP III CLASSIFICATION (LDL):  <100     mg/dL   Optimal  132-440  mg/dL   Near or Above  Optimal  130-159  mg/dL   Borderline  696-295  mg/dL   High  >284     mg/dL   Very High Performed at [facility], [address], [place], [place] [address]   LDL cholesterol, direct     Status: Abnormal   Collection Time: 06/21/22  1:40 PM  Result Value Ref Range   Direct LDL 127 (H) 0 - 99 mg/dL    Comment: Performed at [facility], [address], [place], [place] [address]  CBG monitoring, ED     Status: Abnormal   Collection Time: 06/21/22  4:12 PM  Result Value Ref Range   Glucose-Capillary 324 (H) 70 - 99 mg/dL    Comment: Glucose reference range applies only to samples taken after fasting for at least 8 hours.   No results found.  Pending Labs Unresulted Labs (From admission, onward)     Start     Ordered   06/28/22 0500  Creatinine, serum  (enoxaparin (LOVENOX)    CrCl >/= 30 ml/min)  Weekly,   R     Comments: while on enoxaparin therapy    06/21/22 0323   06/21/22 1333  Metanephrines, plasma  Once,   R        06/21/22 1333            Vitals/Pain Today's Vitals   06/21/22 1430 06/21/22 1500 06/21/22 1530 06/21/22 1625  BP: 127/80 115/64 112/64   Pulse: 75 71 66   Resp: (!) 22 13 16    Temp:    97.6 F (36.4 C)  TempSrc:    Oral  SpO2: 97% 94% 95%   Weight:      Height:      PainSc:        Isolation Precautions No active isolations  Medications Medications  levETIRAcetam (KEPPRA) tablet 500 mg (500 mg Oral Given 06/21/22 0836)  topiramate (TOPAMAX) tablet 100 mg (100 mg Oral Given 06/21/22 0836)  enoxaparin (LOVENOX) injection 40 mg (40 mg Subcutaneous Given 06/21/22  0835)  acetaminophen (TYLENOL) tablet 650 mg (has no administration in time range)    Or  acetaminophen (TYLENOL) suppository 650 mg (has no administration in time range)  senna-docusate (Senokot-S) tablet 1 tablet (has no administration in time range)  aspirin EC tablet 81 mg (81 mg Oral Given 06/21/22 0836)  gemfibrozil (LOPID) tablet 600 mg (600 mg Oral Given 06/21/22 0836)  0.9 %  sodium chloride infusion ( Intravenous New Bag/Given 06/21/22 0841)  insulin glargine-yfgn (SEMGLEE) injection 5 Units (5 Units Subcutaneous Given 06/21/22 1044)  insulin aspart (novoLOG) injection 0-15 Units (11 Units Subcutaneous Given 06/21/22 1617)  insulin aspart (novoLOG) injection 0-5 Units (has no administration in time range)  insulin aspart (novoLOG) injection 2 Units (2 Units Subcutaneous Not Given 06/21/22 1625)  metoprolol tartrate (LOPRESSOR) tablet 37.5 mg (has no administration in time range)  levETIRAcetam (KEPPRA) IVPB 1000 mg/100 mL premix (0 mg Intravenous Stopped 06/21/22 0240)  midazolam (VERSED) injection 2 mg (2 mg Intravenous Given 06/21/22 0146)    Mobility walks with person assist     Focused Assessments Neuro Assessment Handoff:  Swallow screen pass? Yes          Neuro Assessment: Within Defined Limits Neuro Checks:      Has TPA been given? No If patient is a Neuro Trauma and patient is going to OR before floor call report to 4N Charge nurse: [phone] or [phone]   R Recommendations: See Admitting Provider Note  Report given to:   Additional Notes: pt is on room air. Pt is using urinal.

## 2022-06-21 NOTE — ED Notes (Signed)
Called 6E and notified that pt is being transported to unit.

## 2022-06-21 NOTE — H&P (Addendum)
PCP:   Porfirio Oar, PA   Chief Complaint:  Palpitations  HPI: This is a 67 year old male with past medical history of DM type II, HLD, HTN, PAD, and migraines. Patient presents to the ER with complaints of heart palpitation.  In addition to palpitation, he was lightheaded and dizzy, felt presyncopal.  Found to be in SVT heart rate of 177 which self terminated.  This is patient's second ER visit with the same.  3/24 patient in ER, heart rate in the 150s.  Also self terminated.  Patient discharged with directions to follow-up with cardiology outpatient.  Per patient he has been having the symptoms since he was a youngster.  In the remote past he had a 30-day Holter monitor which was unrevealing.  Per patient his palpitations have been occurring with increased frequencies as he gets older.  He has now had 2 this month resulted in ER visits.  While in the ER, nursing noted patient having episodes of eyes deviating to the right with some decreased mentation.  Per wife this has been occurring since April.  He will occur 10-20 times a day.  When he is standing he feels as though his right arm is paralyzed, he becomes incoherent and his balance is really off.  He has to lean onto the wall in order not to fall.  He has never syncopized as a result.  He has follow-up with his PCP, who started him and on Topamax 4/24.  Per wife this does not decrease the frequency of the occurrences.  Patient has no prior history of strokes or CVAs.  His blood pressure is usually on the lower side, he checks at each a.m.  He is diabetic but has no history of hypoglycemia, stating his POCT's are typically in the 200s.  He denies drug use, over-the-counter herbal usage.  He has a neurology appointment in August.  Ophthalmology appointment 5/16.  An upcoming cardiology appointment in June.  Patient seen by neurology in ER.  Admission recommended to evaluate for focal seizures  Review of Systems:  Per HPI  Past Medical  History: Past Medical History:  Diagnosis Date   Cataracts, bilateral    Diabetes mellitus without complication (HCC)    DM (diabetes mellitus) (HCC)    History of basal cell carcinoma of skin    shoulder   HLD (hyperlipidemia)    Hypertension    Migraines    PAD (peripheral artery disease) (HCC)    Prediabetes    Past Surgical History:  Procedure Laterality Date   SPINE SURGERY  1994   TONSILLECTOMY  1961    Medications: Prior to Admission medications   Medication Sig Start Date End Date Taking? Authorizing Provider  aspirin 81 MG tablet Take 81 mg by mouth daily.    [provider]  Aspirin-Calcium Carbonate 81-777 MG TABS Take 81 mg by mouth.    [provider]  b complex vitamins tablet Take by mouth.    [provider]  gemfibrozil (LOPID) 600 MG tablet Take 1 tablet (600 mg total) by mouth 2 (two) times daily before a meal. 06/07/17   Porfirio Oar, PA  glipiZIDE (GLUCOTROL) 10 MG tablet TAKE 1 TABLET TWICE A DAY BEFORE MEALS 12/07/16   Leotis Shames, [place], PA  glucose blood test strip Use as instructed 12/30/15   Porfirio Oar, PA  hydrALAZINE (APRESOLINE) 100 MG tablet TAKE 1 TABLET THREE TIMES A DAY 06/07/17   Porfirio Oar, PA  HYDROcodone-acetaminophen (NORCO) 5-325 MG tablet Take 1-2 tablets  by mouth at bedtime as needed. 03/08/17   Porfirio Oar, PA  labetalol (NORMODYNE) 200 MG tablet Take 1 tablet (200 mg total) by mouth 2 (two) times daily. 06/07/17   Porfirio Oar, PA  Lancets 30G MISC Use to check home glucose daily and as needed 12/30/15   Porfirio Oar, PA  lisinopril (PRINIVIL,ZESTRIL) 40 MG tablet Take 1 tablet (40 mg total) by mouth daily. 06/07/17   Porfirio Oar, PA  metFORMIN (GLUCOPHAGE) 500 MG tablet Take 2 tablets (1,000 mg total) by mouth 2 (two) times daily with a meal. 12/07/16   Porfirio Oar, PA  metoprolol succinate (TOPROL-XL) 50 MG 24 hr tablet Take 1 tablet (50 mg total) by mouth daily. Take with or immediately  following a meal. 06/07/17   Porfirio Oar, PA  spironolactone (ALDACTONE) 50 MG tablet Take 1 tablet (50 mg total) by mouth daily. 06/07/17   Porfirio Oar, PA    Allergies:   Allergies  Allergen Reactions   Statins     Leg cramps   Gabapentin Palpitations    Social History:  reports that he has quit smoking. His smoking use included cigarettes. He started smoking about 14 years ago. He has never used smokeless tobacco. He reports that he does not drink alcohol and does not use drugs.  Family History: Family History  Problem Relation Age of Onset   Diabetes Father    Hypertension Father    Hypertension Mother    Hypertension Sister    Diabetes Sister        diet controlled    Physical Exam: Vitals:   06/20/22 2300 06/20/22 2315 06/20/22 2347 06/21/22 0145  BP: 126/73 118/73  135/77  Pulse: (!) 101 100  85  Resp: 19 13  18   Temp:   98.2 F (36.8 C)   TempSrc:   Oral   SpO2: 96% 95%  95%  Weight:        General:  Alert and oriented times three, well developed and nourished, no acute distress Eyes: PERRLA, pink conjunctiva, no scleral icterus ENT: Moist oral mucosa, neck supple, no thyromegaly Lungs: clear to ascultation, no wheeze, no crackles, no use of accessory muscles Cardiovascular: regular rate and rhythm, no regurgitation, no gallops, no murmurs. No carotid bruits, no JVD Abdomen: soft, positive BS, non-tender, non-distended, no organomegaly, not an acute abdomen GU: not examined Neuro: CN II - XII grossly intact, sensation intact Musculoskeletal: strength 5/5 all extremities, no clubbing, cyanosis or edema Skin: no rash, no subcutaneous crepitation, no decubitus Psych: appropriate patient   Labs on Admission:  Recent Labs    06/20/22 2322  NA 132*  K 4.6  CL 105  CO2 16*  GLUCOSE 391*  BUN 42*  CREATININE 1.62*  CALCIUM 9.3  MG 2.1    Radiological Exams on Admission: No results found.  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  Keppra 500 mg twice daily started per neuro recommendation -MRI brain ordered -EEG planned for a.m. -UDS ordered   SVTs -TSH ordered -Will place Cardiology consult as patient admitted -Lisinopril 40mg  daily on hold d/t AKI. -Metoprolol 25 mg PO BiD ordered  AKI -IV fluid hydration.  Patient's creatinine 1.6 (baseline creatinine 0.89  11/28/22) -Hold lisinopril  Diabetes mellitus  -Sliding scale insulin ordered  Hyperlipidemia -Gemfibrozil resumed   Hypertension -Hold lisinopril with AKI   PAD (peripheral artery disease) (HCC) -  Ostin Mathey 06/21/2022, 2:26 AM

## 2022-06-21 NOTE — ED Provider Notes (Signed)
1:28 AM Patient evaluated by Neurology. Per Dr. Iver Nestle:   Patient's symptoms are highly concerning for focal seizure, refractory to topiramate at a maximal dose for his renal function.  Potentially aggravated by very poor glucose control.  No clear seizure focus on MRI brain performed 4/3 but given the progressive nature of his symptoms I do think a repeat MRI with and without contrast is indicated.  Will add Keppra.  Events are unlikely to be characterized well on rapid EEG due to focal nature; do not feel emergent EEG is needed overnight but will obtain one during regular technician hours tomorrow   Recommendations:  - Keppra 2000 mg load, then 750 mg BID (maxed for CrCl of 45)  - Topirimate continue home dose of 100 mg BID (maxed for renal function given indication of seizure control)  - MRI brain w/ and w/o contrast due to progressive symptoms  - EEG in the morning, will be ordered routine if symptoms resolved or LTM for spell capture if ongoing   Consult placed for unassigned medical admission.  1:46 AM Spoke with Dr. Joneen Roach of [facility] who will admit.   Antony Madura, PA-C 06/21/22 0147    Dione Booze, MD 06/21/22 [id number]

## 2022-06-21 NOTE — ED Notes (Signed)
Called EEG tech and requested EEG tech to disconnect pt from EEG monitor to transport pt upstairs and to reconnect pt back to EEG monitor upstairs. EEG tech said she is on the way.

## 2022-06-21 NOTE — Consult Note (Signed)
Neurology Consultation Reason for Consult: Stereotyped visual disturbances Requesting Physician:   CC: Visual impairment  History is obtained from: Patient, wife of 13 years at bedside, chart review   HPI: Kyle [name] is a 67 y.o. male with past medical history significant for type 2 diabetes, hyperlipidemia, hypertension, peripheral artery disease, ocular migraines and cataracts.  He initially presented to the ED on 4/3 reporting 2 days of headache and right-sided vision changes, with blood sugars running in the 300s to 400s and reluctance to use insulin due to cost (also off of Jardiance due to cost).  He was noted to have had some minor head trauma, MRI brain obtained was negative and he was instructed to have outpatient follow-up with neurology and ophthalmology as well as cardiology due to an episode of SVT while in the ED.  Today he presented again with 1 hour of unexplained SVT, and was having multiple episodes of vision changes much more prominent than he had presented with earlier this month.  At times during my evaluation he would become very slow to respond but still able to interact somewhat.  He would have a variable right gaze preference during these events.  He was reporting flashing lights on the right side of his vision during these events  Premorbid modified rankin scale:      0 - No symptoms   ROS: All other review of systems was negative except as noted in the HPI, with caveat of patient intermittently altered during my eval   Past Medical History:  Diagnosis Date   Cataracts, bilateral    Diabetes mellitus without complication (HCC)    DM (diabetes mellitus) (HCC)    History of basal cell carcinoma of skin    shoulder   HLD (hyperlipidemia)    Hypertension    Migraines    PAD (peripheral artery disease) (HCC)    Prediabetes    Past Surgical History:  Procedure Laterality Date   SPINE SURGERY  1994   TONSILLECTOMY  1961   Current Outpatient  Medications  Medication Instructions   Aspirin-Calcium Carbonate 81-777 MG TABS 81 mg, Oral   aspirin 81 mg, Oral, Daily   b complex vitamins tablet Oral   gemfibrozil (LOPID) 600 mg, Oral, 2 times daily before meals   glipiZIDE (GLUCOTROL) 10 MG tablet TAKE 1 TABLET TWICE A DAY BEFORE MEALS   glucose blood test strip Use as instructed   hydrALAZINE (APRESOLINE) 100 MG tablet TAKE 1 TABLET THREE TIMES A DAY   HYDROcodone-acetaminophen (NORCO) 5-325 MG tablet 1-2 tablets, Oral, At bedtime PRN   labetalol (NORMODYNE) 200 mg, Oral, 2 times daily   Lancets 30G MISC Use to check home glucose daily and as needed   lisinopril (ZESTRIL) 40 mg, Oral, Daily   metFORMIN (GLUCOPHAGE) 1,000 mg, Oral, 2 times daily with meals   metoprolol succinate (TOPROL-XL) 50 mg, Oral, Daily, Take with or immediately following a meal.    spironolactone (ALDACTONE) 50 mg, Oral, Daily     Family History  Problem Relation Age of Onset   Diabetes Father    Hypertension Father    Hypertension Mother    Hypertension Sister    Diabetes Sister        diet controlled    Social History:  reports that he has quit smoking. His smoking use included cigarettes. He started smoking about 14 years ago. He has never used smokeless tobacco. He reports that he does not drink alcohol and does not use drugs.   Exam: Current  vital signs: BP 118/73   Pulse 100   Temp 98.2 F (36.8 C) (Oral)   Resp 13   Wt 79.4 kg   SpO2 95%   BMI 25.11 kg/m  Vital signs in last 24 hours: Temp:  [98.2 F (36.8 C)] 98.2 F (36.8 C) (05/05 2347) Pulse Rate:  [100-177] 100 (05/05 2315) Resp:  [13-19] 13 (05/05 2315) BP: (108-131)/(73-78) 118/73 (05/05 2315) SpO2:  [95 %-99 %] 95 % (05/05 2315) Weight:  [79.4 kg] 79.4 kg (05/05 2210)   Physical Exam  Constitutional: Appears well-developed and well-nourished.  Psych: Affect appropriately concerned, cooperative, pleasant.  Frequent brief restless movements of all 4 extremities Eyes:  No scleral injection HENT: No oropharyngeal obstruction.  MSK: no major joint deformities.  Cardiovascular: Normal rate and regular rhythm, with heart rates in the 90s at the time of my evaluation Respiratory: Effort normal, non-labored breathing GI: Soft.  No distension. There is no tenderness.  Skin: Warm dry and intact visible skin  Neuro: Mental Status: Patient is awake, alert, oriented to person, place, month, year, and situation when not having an acute event as described in HPI Patient is able to give a clear and coherent history, other than when intermittently confused No signs of aphasia or neglect other than when intermittently confused Cranial Nerves: II: Visual Fields are notable for a left inferior quadrantanopia even when episodes are resolved. Pupils are equal, round, and reactive to light.   III,IV, VI: EOMI without ptosis or diploplia (with intermittent right gaze deviation as described above).  V: Facial sensation is symmetric to light touch VII: Facial movement is symmetric.  VIII: hearing is intact to voice X: Uvula elevates symmetrically XI: Shoulder shrug is symmetric. XII: tongue is midline without atrophy or fasciculations.  Motor: Tone is normal. Bulk is normal. 5/5 strength was present in all four extremities.  Sensory: Sensation is symmetric to light touch and temperature in the arms and legs. Deep Tendon Reflexes: 2+ and symmetric in the brachioradialis and patellae.  Positive Hoffmann's on the left only Cerebellar: Finger-to-nose significantly impaired during an episode but on retesting normal.  Heel-to-shin normal Gait:  Deferred   I have reviewed labs in epic and the results pertinent to this consultation are:  Basic Metabolic Panel: Recent Labs  Lab 06/20/22 2322  NA 132*  K 4.6  CL 105  CO2 16*  GLUCOSE 391*  BUN 42*  CREATININE 1.62*  CALCIUM 9.3  MG 2.1  Estimated Creatinine Clearance: 45.1 mL/min (A) (by C-G formula based on SCr of  1.62 mg/dL (H)).   CBC: Recent Labs  Lab 06/20/22 2322  WBC 9.1  NEUTROABS 5.4  HGB 10.4*  HCT 30.6*  MCV 90.5  PLT 263    Coagulation Studies: No results for input(s): "LABPROT", "INR" in the last 72 hours.   Lab Results  Component Value Date   HGBA1C 7.5 (H) 06/07/2017    I have reviewed the images obtained:  MRI brain personally reviewed, discussed with Dr. Phill Myron neuroradiology, and reviewed with patient and family at bedside Likely small focus of chronic microhemorrhage in the left occipital lobe, prior read to be addended  Assessment: Patient's symptoms are highly concerning for focal seizure, refractory to topiramate at a maximal dose for his renal function.  Potentially aggravated by very poor glucose control.  On re-review of MRI brain performed 4/3 it does seem that the patient has a possible seizure focus that would correlate well with his current symptoms. If they remain refractory to  addition of Keppra and glucose control, a repeat MRI with and without contrast may be needed.  Events are unlikely to be characterized well on rapid EEG due to focal nature; do not feel emergent EEG is needed overnight but will obtain one during regular technician hours tomorrow, potentially long-term EEG if symptoms remain very frequent.  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  Recommendations: - Keppra 2000 mg load, then 750 mg BID (maxed for CrCl of 45) - Topirimate continue home dose of 100 mg BID (maxed for renal function given indication of seizure control) - Glucose control (LDSSI ordered) - MRI brain w/ and w/o if symptoms remain refractory to addition of Keppra and Glucose control - EEG in the morning, will be ordered routine if symptoms resolved or  LTM for spell capture if ongoing - Neurology will follow along  Brooke Dare MD-PhD [facility] [phone] Available 7 PM to 7 AM, outside of these hours please call Neurologist on call as listed on Amion.

## 2022-06-22 ENCOUNTER — Inpatient Hospital Stay (HOSPITAL_COMMUNITY): Payer: Medicare Other

## 2022-06-22 ENCOUNTER — Other Ambulatory Visit: Payer: Self-pay | Admitting: Physician Assistant

## 2022-06-22 DIAGNOSIS — E782 Mixed hyperlipidemia: Secondary | ICD-10-CM | POA: Diagnosis not present

## 2022-06-22 DIAGNOSIS — I10 Essential (primary) hypertension: Secondary | ICD-10-CM

## 2022-06-22 DIAGNOSIS — I4719 Other supraventricular tachycardia: Secondary | ICD-10-CM

## 2022-06-22 DIAGNOSIS — E785 Hyperlipidemia, unspecified: Secondary | ICD-10-CM

## 2022-06-22 DIAGNOSIS — R569 Unspecified convulsions: Secondary | ICD-10-CM | POA: Diagnosis not present

## 2022-06-22 DIAGNOSIS — N179 Acute kidney failure, unspecified: Secondary | ICD-10-CM | POA: Diagnosis not present

## 2022-06-22 DIAGNOSIS — I471 Supraventricular tachycardia, unspecified: Secondary | ICD-10-CM | POA: Diagnosis not present

## 2022-06-22 DIAGNOSIS — I739 Peripheral vascular disease, unspecified: Secondary | ICD-10-CM | POA: Diagnosis not present

## 2022-06-22 LAB — BASIC METABOLIC PANEL
Anion gap: 9 (ref 5–15)
Anion gap: 10 (ref 5–15)
BUN: 28 mg/dL — ABNORMAL HIGH (ref 8–23)
BUN: 25 mg/dL — ABNORMAL HIGH (ref 8–23)
CO2: 18 mmol/L — ABNORMAL LOW (ref 22–32)
CO2: 19 mmol/L — ABNORMAL LOW (ref 22–32)
Calcium: 9.4 mg/dL (ref 8.9–10.3)
Calcium: 9.6 mg/dL (ref 8.9–10.3)
Chloride: 108 mmol/L (ref 98–111)
Chloride: 109 mmol/L (ref 98–111)
Creatinine, Ser: 1.03 mg/dL (ref 0.61–1.24)
Creatinine, Ser: 0.96 mg/dL (ref 0.61–1.24)
GFR, Estimated: >60 mL/min (ref >60)
GFR, Estimated: >60 mL/min (ref >60)
Glucose, Bld: 295 mg/dL — ABNORMAL HIGH (ref 70–99)
Glucose, Bld: 368 mg/dL — ABNORMAL HIGH (ref 70–99)
Potassium: 4.2 mmol/L (ref 3.5–5.1)
Potassium: 3.8 mmol/L (ref 3.5–5.1)
Sodium: 135 mmol/L (ref 135–145)
Sodium: 138 mmol/L (ref 135–145)

## 2022-06-22 LAB — ECHOCARDIOGRAM COMPLETE
Area-P 1/2: 2.11 cm2
Calc EF: 56.7 %
Height: 70 in
S' Lateral: 3.1 cm
Single Plane A2C EF: 56.6 %
Single Plane A4C EF: 57.1 %
Weight: 2797.2 oz

## 2022-06-22 LAB — GLUCOSE, CAPILLARY
Glucose-Capillary: 279 mg/dL — ABNORMAL HIGH (ref 70–99)
Glucose-Capillary: 261 mg/dL — ABNORMAL HIGH (ref 70–99)
Glucose-Capillary: 182 mg/dL — ABNORMAL HIGH (ref 70–99)
Glucose-Capillary: 213 mg/dL — ABNORMAL HIGH (ref 70–99)

## 2022-06-22 MED ORDER — SODIUM CHLORIDE 0.9 % IV SOLN
15.0000 mg/kg | Freq: Once | INTRAVENOUS | Status: AC
  Administered 2022-06-22: 1189.5 mg via INTRAVENOUS
  Filled 2022-06-22: qty 23.79

## 2022-06-22 MED ORDER — INSULIN ASPART 100 UNIT/ML IJ SOLN
4.0000 [IU] | Freq: Three times a day (TID) | INTRAMUSCULAR | Status: DC
  Administered 2022-06-22 – 2022-07-09 (×36): 4 [IU] via SUBCUTANEOUS

## 2022-06-22 MED ORDER — LEVETIRACETAM 500 MG PO TABS
1000.0000 mg | ORAL_TABLET | Freq: Two times a day (BID) | ORAL | Status: DC
  Administered 2022-06-22 – 2022-06-23 (×2): 1000 mg via ORAL
  Filled 2022-06-22 (×2): qty 2

## 2022-06-22 MED ORDER — INSULIN GLARGINE-YFGN 100 UNIT/ML ~~LOC~~ SOLN
15.0000 [IU] | Freq: Every day | SUBCUTANEOUS | Status: DC
  Administered 2022-06-22 – 2022-06-27 (×6): 15 [IU] via SUBCUTANEOUS
  Filled 2022-06-22 (×6): qty 0.15

## 2022-06-22 MED ORDER — LEVETIRACETAM 500 MG PO TABS
500.0000 mg | ORAL_TABLET | ORAL | Status: AC
  Administered 2022-06-22: 500 mg via ORAL
  Filled 2022-06-22: qty 1

## 2022-06-22 NOTE — Progress Notes (Addendum)
PROGRESS NOTE   Kyle [name]  ZOX:[id number]    DOB: 08/20/1955    DOA: 06/20/2022  PCP: No primary care provider on file.   I have briefly reviewed patients previous medical records in [facility].  Chief Complaint  Patient presents with   Tachycardia    Brief Narrative:  67 year old married male, independent, medical history significant for type II DM, HTN, HLD, PAD, recently seen in the ED on 05/19/2022 for complaints of visual disturbance x 2 days, unremarkable neurological exam/CT head/MRI brain, seen by neurology who recommended outpatient follow-up, also had episode of SVT with plans for outpatient cardiology follow-up, presented again to the ED on 06/20/2022 with complaints of palpitations, lightheadedness, dizziness, presyncopal.  He was found to be in SVT with heart rate of 177 which self terminated.  In the ED, also noted to have seizure-like activity for which neurology consulted.  LTM EEG with ongoing seizures without clinical signs, averaging 3 seizures per hour lasting about 2 to 3 minutes each.   Assessment & Plan:  Principal Problem:   Focal seizures (HCC) Active Problems:   Hypertension   Diabetes (HCC)   Hyperlipidemia   PAD (peripheral artery disease) (HCC)   Seizure (HCC)   Migraines   SVT (supraventricular tachycardia)   Visual disturbances   Focal seizures: In ED, nursing noted patient having episodes of eyes deviating to the right with some decreased mentation.  Details of seizure-like activity as documented in H&P by Dr. Gery Pray.  Reportedly occurring since April, multiple times a day, associated right arm weakness, incoherence see and unsteadiness.  PCP started Topamax 4/24 without improvement.  Has ophthalmology appointment 5/16.  Neurology consultation appreciated.  Suspect seizures refractory to topiramate at max dose.  Seizures potentially aggravated by poorly controlled DM.  MRI brain 4/3 suspected to have possible seizure focus that would  correlate well with his current symptoms.  On admission, had been loaded with Keppra and continued on maintenance Keppra 500 Mg twice daily.  LTM EEG shows ongoing seizure activity.  Given IV Keppra 500 Mg x 1 today and Keppra increased to 1 g twice daily.  Continuing topiramate 100 Mg twice daily.  Neurology continue to follow, not sure if they will repeat MRI brain. Patient/spouse have been instructed that he should not drive x 6 months.  Per spouse, he has not been driving since his ED visit on 05/19/2022.  Paroxysmal SVT: Admission EKG showed SVT at 170.  Telemetry has not captured any episodes since admission.  As per H&P, he has had the symptoms since he was a youngster, has had a 30-day Holter monitor in the remote past which was unrevealing, palpitations have been occurring with increased frequencies as he has gotten older.  No recent echo, ordered.  Last TTE in 2014 showed normal EF.  Continue telemetry.  TSH normal at 2.143.  Cardiology was consulted and input appreciated.  He was on labetalol at home, switched to metoprolol this admission and no further episodes on telemetry.  Cardiology are to be contacted at time of discharge so they can arrange 14-day ZIO.  Cardiology signed off 5/7.  Acute kidney injury: Creatinine in Care Everywhere 0.95 in January.  Presented with creatinine of 1.62.  Lisinopril held.  IV fluids.  Resolved.  Stopped IV fluids.  Consider resuming lisinopril and Aldactone at time of discharge.  Poorly controlled type II DM with hyperglycemia: A1c 11.6.  Home metformin and glipizide on hold.  Will likely need insulins at discharge.  DM  coordinator input appreciated.  Reportedly home CBG checks have been higher than his usual of 150 over the last 3 weeks.  Glucose 295 and 368 on this morning's BMP.  Increased Semglee from 5 to 15 units daily, mealtime NovoLog from 2 to 4 units 3 times daily with meals and continue NovoLog moderate SSI.  Will likely need to be on at least once  daily long-acting insulin at time of discharge.  Monitor CBGs and titrate insulins as needed.  Hypertension/hyperaldosteronism Currently on metoprolol 37.5 Mg twice daily and BP controlled.  Home hydralazine, lisinopril and spironolactone on hold.  Could consider resuming reduced dose lisinopril and spironolactone at discharge.  Hyperlipidemia/hypertriglyceridemia Continue gemfibrozil.  Statin intolerant.  Per cardiology, recommend lipid clinic evaluation and they will arrange follow-up  PAD: Continue aspirin and gemfibrozil.  Normocytic anemia: Baseline hemoglobin in the 12 g range.  Unclear etiology.?  Dilutional.  Follow CBC in AM.  Body mass index is 25.08 kg/m.   ACP Documents: None present DVT prophylaxis: enoxaparin (LOVENOX) injection 40 mg Start: 06/21/22 1000     Code Status: Full Code:  Family Communication: Discussed with spouse via phone 5/7. Per spouse, mentation still not at baseline.  She wanted a copy of the MRI images, directed her to medical records. Disposition:  Remains in patient due to uncontrolled seizures.     Consultants:   Neurology Cardiology  Procedures:   LTM EEG:  IMPRESSION: This study showed seizures without clinical signs arising from left posterior quadrant, average 3 seizures per hour, lasting about 2 to 3 minutes each.  Antimicrobials:      Subjective:  A little more coherent than yesterday but still seems off.  Alert and oriented to self and place.  Answers however not inappropriate at times.  When asked why he is in the hospital, states that he came for medications.  Denies flashes of light, chest pain, palpitations.  Discussed with RN and requested that she have spouse report as to his mental status currently compared to his baseline, when she comes to visit him.  Objective:   Vitals:   06/21/22 1732 06/21/22 2012 06/22/22 0519 06/22/22 0750  BP: 114/65 (!) 143/75 104/61 115/75  Pulse: 71 84 82 74  Resp: 18 18 18 18   Temp:  97.8 F (36.6 C) 97.6 F (36.4 C) 98 F (36.7 C) 97.7 F (36.5 C)  TempSrc: Oral Oral Oral Oral  SpO2: 97% 98% 98% 95%  Weight:      Height:        General exam: Middle-age male, looks older than stated age, moderately built and nourished lying comfortably propped up in bed without distress. Respiratory system: Clear to auscultation.  No increased work of breathing. Cardiovascular system: S1 & S2 heard, RRR. No JVD, murmurs, rubs, gallops or clicks. No pedal edema.  Telemetry personally reviewed: Sinus rhythm.  No recurrence of SVT since hospital admission. Gastrointestinal system: Abdomen is nondistended, soft and nontender. No organomegaly or masses felt. Normal bowel sounds heard. Central nervous system: Alert and oriented to person and place. No focal neurological deficits. Extremities: Symmetric 5 x 5 power. Skin: No rashes, lesions or ulcers Psychiatry: Judgement and insight impaired. Mood & affect appropriate.     Data Reviewed:   I have personally reviewed following labs and imaging studies   CBC: Recent Labs  Lab 06/20/22 2322 06/21/22 0328  WBC 9.1 9.9  NEUTROABS 5.4 5.5  HGB 10.4* 10.3*  HCT 30.6* 30.5*  MCV 90.5 91.3  PLT 263 257  Basic Metabolic Panel: Recent Labs  Lab 06/20/22 2322 06/21/22 0328 06/22/22 0710 06/22/22 1025  NA 132* 135 135 138  K 4.6 3.9 4.2 3.8  CL 105 108 108 109  CO2 16* 18* 18* 19*  GLUCOSE 391* 321* 295* 368*  BUN 42* 39* 28* 25*  CREATININE 1.62* 1.31* 1.03 0.96  CALCIUM 9.3 9.3 9.4 9.6  MG 2.1 2.1  --   --     Liver Function Tests: No results for input(s): "AST", "ALT", "ALKPHOS", "BILITOT", "PROT", "ALBUMIN" in the last 168 hours.  CBG: Recent Labs  Lab 06/21/22 2147 06/22/22 0748 06/22/22 1211  GLUCAP 331* 279* 261*    Microbiology Studies:  No results found for this or any previous visit (from the past 240 hour(s)).  Radiology Studies:  ECHOCARDIOGRAM COMPLETE  Result Date: 06/22/2022     ECHOCARDIOGRAM REPORT   Patient Name:   JAYVEON Versteeg Date of Exam: 06/22/2022 Medical Rec #:  [id number]         Height:       70.0 in Accession #:    [id number]        Weight:       174.8 lb Date of Birth:  [birth year]         BSA:          1.971 m Patient Age:    66 years          BP:           115/75 mmHg Patient Gender: M                 HR:           75 bpm. Exam Location:  Inpatient Procedure: 2D Echo, Cardiac Doppler and Color Doppler Indications:    I47.1 SVT  History:        Patient has prior history of Echocardiogram examinations. Risk                 Factors:Hypertension, Diabetes and Dyslipidemia.  Sonographer:    Mike Gip Referring Phys: [phone] Kjersti Dittmer D Ashliegh Parekh IMPRESSIONS  1. Left ventricular ejection fraction, by estimation, is 55 to 60%. Left ventricular ejection fraction by 2D MOD biplane is 56.7 %. The left ventricle has normal function. The left ventricle has no regional wall motion abnormalities. Left ventricular diastolic parameters are consistent with Grade I diastolic dysfunction (impaired relaxation).  2. Right ventricular systolic function is normal. The right ventricular size is normal. There is normal pulmonary artery systolic pressure. The estimated right ventricular systolic pressure is 19.0 mmHg.  3. The mitral valve is grossly normal. Trivial mitral valve regurgitation. No evidence of mitral stenosis.  4. The aortic valve is tricuspid. Aortic valve regurgitation is not visualized. No aortic stenosis is present.  5. The inferior vena cava is normal in size with greater than 50% respiratory variability, suggesting right atrial pressure of 3 mmHg. FINDINGS  Left Ventricle: Left ventricular ejection fraction, by estimation, is 55 to 60%. Left ventricular ejection fraction by 2D MOD biplane is 56.7 %. The left ventricle has normal function. The left ventricle has no regional wall motion abnormalities. The left ventricular internal cavity size was normal in size. There is no left  ventricular hypertrophy. Left ventricular diastolic parameters are consistent with Grade I diastolic dysfunction (impaired relaxation). Right Ventricle: The right ventricular size is normal. No increase in right ventricular wall thickness. Right ventricular systolic function is normal. There is normal pulmonary artery systolic pressure. The tricuspid regurgitant velocity is  2.00 m/s, and  with an assumed right atrial pressure of 3 mmHg, the estimated right ventricular systolic pressure is 19.0 mmHg. Left Atrium: Left atrial size was normal in size. Right Atrium: Right atrial size was normal in size. Pericardium: There is no evidence of pericardial effusion. Mitral Valve: The mitral valve is grossly normal. Trivial mitral valve regurgitation. No evidence of mitral valve stenosis. Tricuspid Valve: The tricuspid valve is grossly normal. Tricuspid valve regurgitation is trivial. No evidence of tricuspid stenosis. Aortic Valve: The aortic valve is tricuspid. Aortic valve regurgitation is not visualized. No aortic stenosis is present. Pulmonic Valve: The pulmonic valve was grossly normal. Pulmonic valve regurgitation is not visualized. No evidence of pulmonic stenosis. Aorta: The aortic root is normal in size and structure. Venous: The inferior vena cava is normal in size with greater than 50% respiratory variability, suggesting right atrial pressure of 3 mmHg. IAS/Shunts: The atrial septum is grossly normal.  LEFT VENTRICLE PLAX 2D                        Biplane EF (MOD) LVIDd:         4.30 cm         LV Biplane EF:   Left LVIDs:         3.10 cm                          ventricular LV PW:         0.90 cm                          ejection LV IVS:        0.90 cm                          fraction by LVOT diam:     2.00 cm                          2D MOD LV SV:         46                               biplane is LV SV Index:   23                               56.7 %. LVOT Area:     3.14 cm                                 Diastology                                LV e' medial:    7.07 cm/s LV Volumes (MOD)               LV E/e' medial:  4.8 LV vol d, MOD    104.0 ml      LV e' lateral:   9.46 cm/s A2C:                           LV E/e' lateral: 3.6 LV  vol d, MOD    122.0 ml A4C: LV vol s, MOD    45.1 ml A2C: LV vol s, MOD    52.3 ml A4C: LV SV MOD A2C:   58.9 ml LV SV MOD A4C:   122.0 ml LV SV MOD BP:    64.9 ml RIGHT VENTRICLE             IVC RV Basal diam:  3.80 cm     IVC diam: 1.60 cm RV S prime:     11.50 cm/s TAPSE (M-mode): 1.9 cm LEFT ATRIUM             Index        RIGHT ATRIUM           Index LA diam:        3.30 cm 1.67 cm/m   RA Area:     16.70 cm LA Vol (A2C):   35.7 ml 18.11 ml/m  RA Volume:   43.80 ml  22.22 ml/m LA Vol (A4C):   36.8 ml 18.67 ml/m LA Biplane Vol: 38.8 ml 19.68 ml/m  AORTIC VALVE LVOT Vmax:   85.00 cm/s LVOT Vmean:  53.100 cm/s LVOT VTI:    0.147 m  AORTA Ao Root diam: 3.10 cm MITRAL VALVE               TRICUSPID VALVE MV Area (PHT): 2.11 cm    TR Peak grad:   16.0 mmHg MV Decel Time: 360 msec    TR Vmax:        200.00 cm/s MV E velocity: 33.80 cm/s MV A velocity: 69.40 cm/s  SHUNTS MV E/A ratio:  0.49        Systemic VTI:  0.15 m                            Systemic Diam: 2.00 cm Lennie Odor MD Electronically signed by Lennie Odor MD Signature Date/Time: 06/22/2022/2:01:42 PM    Final    Overnight EEG with video  Result Date: 06/22/2022 Charlsie Quest, MD     06/22/2022 11:01 AM Patient Name: Miranda Mcelhinny MRN: [id number] Epilepsy Attending: Charlsie Quest Referring Physician/Provider: Gordy Councilman, MD Duration: 06/21/2022 0848 to 06/22/2022 0848 Patient history: 67yo M with seizure like activity. EEG to evaluate for seizure. Level of alertness: Awake, asleep AEDs during EEG study: LEV Technical aspects: This EEG study was done with scalp electrodes positioned according to the 10-20 International system of electrode placement. Electrical activity was reviewed with band pass filter of  1-70Hz , sensitivity of 7 uV/mm, display speed of 49mm/sec with a 60Hz  notched filter applied as appropriate. EEG data were recorded continuously and digitally stored.  Video monitoring was available and reviewed as appropriate. Description: The posterior dominant rhythm consists of 9-10 Hz activity of moderate voltage (25-35 uV) seen predominantly in posterior head regions, symmetric and reactive to eye opening and eye closing. Sleep was characterized by vertex waves, sleep spindles (12 to 14 Hz), maximal frontocentral region.  Seizures without clinical signs were noted arising from left posterior quadrant.  At the onset, EEG showed low amplitude 15 to 16 Hz beta activity in left posterior quadrant which then evolved into 9 to 10 Hz alpha activity followed by involving all of left hemisphere and evolving into 3 to 5 Hz theta-delta slowing.  Average 3 seizures were noted every hour, lasting about 2 to 3 minutes each. Hyperventilation and photic stimulation were not  performed.   ABNORMALITY -Seizure without clinical signs, left posterior quadrant IMPRESSION: This study showed seizures without clinical signs arising from left posterior quadrant, average 3 seizures per hour, lasting about 2 to 3 minutes each. Priyanka Annabelle Harman    Scheduled Meds:    aspirin EC  81 mg Oral Daily   enoxaparin (LOVENOX) injection  40 mg Subcutaneous Q24H   gemfibrozil  600 mg Oral BID AC   insulin aspart  0-15 Units Subcutaneous TID WC   insulin aspart  0-5 Units Subcutaneous QHS   insulin aspart  4 Units Subcutaneous TID WC   insulin glargine-yfgn  15 Units Subcutaneous Daily   levETIRAcetam  1,000 mg Oral BID   metoprolol tartrate  37.5 mg Oral BID   topiramate  100 mg Oral BID    Continuous Infusions:       LOS: 1 day     Marcellus Scott, MD,  FACP, FHM, SFHM, [facility], [facility]   [facility] [place]     To contact the attending provider between 7A-7P or the covering provider  during after hours 7P-7A, please log into the web site [email or web address] and access using universal [place] password for that web site. If you do not have the password, please call the hospital operator.  06/22/2022, 2:07 PM

## 2022-06-22 NOTE — Procedures (Signed)
Patient Name: Lacey Chaussee  MRN: [id number]  Epilepsy Attending: Charlsie Quest  Referring Physician/Provider: Gordy Councilman, MD  Duration: 06/21/2022 0848 to 06/22/2022 0848  Patient history: 67yo M with seizure like activity. EEG to evaluate for seizure.   Level of alertness: Awake, asleep  AEDs during EEG study: LEV  Technical aspects: This EEG study was done with scalp electrodes positioned according to the 10-20 International system of electrode placement. Electrical activity was reviewed with band pass filter of 1-70Hz , sensitivity of 7 uV/mm, display speed of 62mm/sec with a 60Hz  notched filter applied as appropriate. EEG data were recorded continuously and digitally stored.  Video monitoring was available and reviewed as appropriate.  Description: The posterior dominant rhythm consists of 9-10 Hz activity of moderate voltage (25-35 uV) seen predominantly in posterior head regions, symmetric and reactive to eye opening and eye closing. Sleep was characterized by vertex waves, sleep spindles (12 to 14 Hz), maximal frontocentral region.    Seizures without clinical signs were noted arising from left posterior quadrant.  At the onset, EEG showed low amplitude 15 to 16 Hz beta activity in left posterior quadrant which then evolved into 9 to 10 Hz alpha activity followed by involving all of left hemisphere and evolving into 3 to 5 Hz theta-delta slowing.  Average 3 seizures were noted every hour, lasting about 2 to 3 minutes each.  Hyperventilation and photic stimulation were not performed.     ABNORMALITY -Seizure without clinical signs, left posterior quadrant   IMPRESSION: This study showed seizures without clinical signs arising from left posterior quadrant, average 3 seizures per hour, lasting about 2 to 3 minutes each.  Jakin Pavao Annabelle Harman

## 2022-06-22 NOTE — Progress Notes (Signed)
Echocardiogram 2D Echocardiogram has been performed.  Toni Amend 06/22/2022, 11:29 AM

## 2022-06-22 NOTE — Progress Notes (Signed)
Subjective: NAEO. Reports seeing flashing lights in right eye earlier but denies any other concerns.  ROS: negative except above Examination  Vital signs in last 24 hours: Temp:  [97.6 F (36.4 C)-98 F (36.7 C)] 98 F (36.7 C) (05/07 1605) Pulse Rate:  [74-84] 78 (05/07 1605) Resp:  [14-18] 14 (05/07 1605) BP: (104-143)/(61-78) 137/78 (05/07 1605) SpO2:  [95 %-98 %] 97 % (05/07 1605)  General: lying in bed, NAD Neuro: MS: Alert, oriented, follows commands CN: pupils equal and reactive,  EOMI, face symmetric, tongue midline, normal sensation over face, Motor: 5/5 strength in all 4 extremities Coordination: normal Gait: not tested  Basic Metabolic Panel: Recent Labs  Lab 06/20/22 2322 06/21/22 0328 06/22/22 0710 06/22/22 1025  NA 132* 135 135 138  K 4.6 3.9 4.2 3.8  CL 105 108 108 109  CO2 16* 18* 18* 19*  GLUCOSE 391* 321* 295* 368*  BUN 42* 39* 28* 25*  CREATININE 1.62* 1.31* 1.03 0.96  CALCIUM 9.3 9.3 9.4 9.6  MG 2.1 2.1  --   --     CBC: Recent Labs  Lab 06/20/22 2322 06/21/22 0328  WBC 9.1 9.9  NEUTROABS 5.4 5.5  HGB 10.4* 10.3*  HCT 30.6* 30.5*  MCV 90.5 91.3  PLT 263 257     Coagulation Studies: No results for input(s): "LABPROT", "INR" in the last 72 hours.  Imaging MR Brain wo contrast 05/19/2022: Small curvilinear focus of susceptibility artifact involving the left occipital lobe (series 8, image 48), likely a small focus of chronic hemosiderin staining. This could potentially serve as a seizure focus.     ASSESSMENT AND PLAN: 67 yo M with 2 days of headache and right-sided vision changes, with blood sugars running in the 300s to 400s. EEG showed seizures arising from left posterior quadrant.   New onset seizure - Likely lowered seizure threshold due to hyperglycemia.   Recommendations - Increase Keppra to 1000mg  BID - If seizures persist, can increase Topiramate to 150mg  BID - continuee eeg while we are adjusting anti seizure meds -  continue seizure precautions - PRN IV versed for clinical seizure  ADDENDUM - Spoke with wife, she saw atleast 4 seizures while she was visiting him over a period of few hours. States during seizures he stops responding to her and has right gaze deviation.  - will load with IV fosphenytoin 15mg /kg once and decide if we need maintenance tomorrow morning depending on how he responds to the load  I have spent a total of  36 minutes with the patient reviewing hospital notes,  test results, labs and examining the patient as well as establishing an assessment and plan that was discussed personally with the patient.  > 50% of time was spent in direct patient care.   Lindie Spruce Epilepsy [facility] For questions after 5pm please refer to AMION to reach the Neurologist on call

## 2022-06-22 NOTE — Progress Notes (Addendum)
Progress Note  Patient Name: Kyle [name] Date of Encounter: 06/22/2022  Primary Cardiologist: Chilton Si, MD  Subjective   Denies CP, SOB, palpitations. Hooked up to EEG. Poor historian. When I asked him to clarify his home address he had trouble doing so. He also had a hard time with the orientation question of place. Oriented to date, self.  Inpatient Medications    Scheduled Meds:  aspirin EC  81 mg Oral Daily   enoxaparin (LOVENOX) injection  40 mg Subcutaneous Q24H   gemfibrozil  600 mg Oral BID AC   insulin aspart  0-15 Units Subcutaneous TID WC   insulin aspart  0-5 Units Subcutaneous QHS   insulin aspart  4 Units Subcutaneous TID WC   insulin glargine-yfgn  15 Units Subcutaneous Daily   levETIRAcetam  500 mg Oral BID   metoprolol tartrate  37.5 mg Oral BID   topiramate  100 mg Oral BID   Continuous Infusions:  PRN Meds: acetaminophen **OR** acetaminophen, senna-docusate   Vital Signs    Vitals:   06/21/22 1732 06/21/22 2012 06/22/22 0519 06/22/22 0750  BP: 114/65 (!) 143/75 104/61 115/75  Pulse: 71 84 82 74  Resp: 18 18 18 18   Temp: 97.8 F (36.6 C) 97.6 F (36.4 C) 98 F (36.7 C) 97.7 F (36.5 C)  TempSrc: Oral Oral Oral Oral  SpO2: 97% 98% 98% 95%  Weight:      Height:        Intake/Output Summary (Last 24 hours) at 06/22/2022 1007 Last data filed at 06/21/2022 2148 Gross per 24 hour  Intake 730.48 ml  Output 800 ml  Net -69.52 ml      06/21/2022    3:20 AM 06/20/2022   10:10 PM 05/19/2022    2:46 PM  Last 3 Weights  Weight (lbs) 174 lb 13.2 oz 175 lb 175 lb  Weight (kg) 79.3 kg 79.379 kg 79.379 kg     Telemetry    NSR - Personally Reviewed  Physical Exam   GEN: No acute distress.  HEENT: Normocephalic, atraumatic, sclera non-icteric. Neck: No JVD or bruits. Cardiac: RRR no murmurs, rubs, or gallops.  Respiratory: Clear to auscultation bilaterally. Breathing is unlabored. GI: Soft, nontender, non-distended, BS +x 4. MS: no  deformity. Extremities: No clubbing or cyanosis. No edema. Distal pedal pulses are 2+ and equal bilaterally. Neuro:  A+O to self. Confabulated, stalled when asked where he is or to clarify his address. Follows commands. Psych:  Calm affect  Labs    High Sensitivity Troponin:  No results for input(s): "TROPONINIHS" in the last 720 hours.    Cardiac EnzymesNo results for input(s): "TROPONINI" in the last 168 hours. No results for input(s): "TROPIPOC" in the last 168 hours.   Chemistry Recent Labs  Lab 06/20/22 2322 06/21/22 0328 06/22/22 0710  NA 132* 135 135  K 4.6 3.9 4.2  CL 105 108 108  CO2 16* 18* 18*  GLUCOSE 391* 321* 295*  BUN 42* 39* 28*  CREATININE 1.62* 1.31* 1.03  CALCIUM 9.3 9.3 9.4  GFRNONAA 46* 59* >60  ANIONGAP 11 9 9      Hematology Recent Labs  Lab 06/20/22 2322 06/21/22 0328  WBC 9.1 9.9  RBC 3.38* 3.34*  HGB 10.4* 10.3*  HCT 30.6* 30.5*  MCV 90.5 91.3  MCH 30.8 30.8  MCHC 34.0 33.8  RDW 13.1 13.2  PLT 263 257    BNPNo results for input(s): "BNP", "PROBNP" in the last 168 hours.   DDimer No results for  input(s): "DDIMER" in the last 168 hours.   Radiology    No results found.  Cardiac Studies   Pending  Patient Profile     67 y.o. male with PAD (normal resting ABIs with abnormal exercise ABIs followed at outside facility), poorly controlled DM, HTN, HLD, migraines, recently diagnosed SVT during ER visit 04/2022 (with longstanding hx of palpitations) has recently been having intermittent visual changes and altered mention. Also presented with palpitations so cardiology asked to see for SVT. Neurology has evaluated and is concerned for focal seizures, EEG pending. Recent MRI 05/19/22 was reported to be unrevealing but addended 06/21/22 showing abnormality of small focus of chronic hemosiderin staining.  Assessment & Plan    1. Visual changes/AMS - patient is poor historian this AM - confabulation related to relaying his home address or where he  is presently. I relayed to Dr. Waymon Amato to ensure this was not an acute change, and he has seen the same - MRI report was addended yesterday with small curvilinear focus of susceptibility artifact involving the left occipital lobe (series 8, image 48), likely a small focus of chronic hemosiderin staining, possibly serving as a seizure focus - also noted to be newly anemic of unclear etiology - further per IM, neurology  2. PSVT - metoprolol increased to 37.5mg  BID, quiescent on increased dose - TSH wnl - plasma metanephrines in process - do not feel this is related #1, separate process incidentally noted at this time - echo pending - MD suggests 30 day monitor at discharge - please let us know when patient is going home so we can arrange to mail to home  3. PAD, HLD - trig 454, LDL 127 - on gemfibrozil PTA with h/o statin intolerance - continue OP follow-up to consider PCSK9i / statin alternatives  4. DM - poorly controlled, A1C 11.6 - per medicine team   For questions or updates, please contact [facility] Please consult [email or web address] for contact info under Cardiology/STEMI.  Signed, Laurann Montana, PA-C 06/22/2022, 10:07 AM

## 2022-06-22 NOTE — Progress Notes (Signed)
Lipid clinic referral 

## 2022-06-22 NOTE — Progress Notes (Signed)
Pt originally had a follow-up new patient appt scheduled next week in our office. Since he saw our team here in the hospital and will require post-monitor f/u, I moved this to June and outlined information on AVS. Our office will also contact him for lipid clinic appt as well. I spoke with wife to make her aware of appt date/location change. As outlined in rounding note, please reach out to cardiology when patient is discharged so we can arrange monitor.

## 2022-06-22 NOTE — Progress Notes (Signed)
LTM maint complete - no skin breakdown . Serviced A1 A2 [facility] monitored, Event button test confirmed by [facility].

## 2022-06-23 ENCOUNTER — Other Ambulatory Visit (HOSPITAL_COMMUNITY): Payer: Self-pay

## 2022-06-23 DIAGNOSIS — R569 Unspecified convulsions: Secondary | ICD-10-CM | POA: Diagnosis not present

## 2022-06-23 DIAGNOSIS — D649 Anemia, unspecified: Secondary | ICD-10-CM | POA: Diagnosis not present

## 2022-06-23 DIAGNOSIS — I471 Supraventricular tachycardia, unspecified: Secondary | ICD-10-CM | POA: Diagnosis not present

## 2022-06-23 LAB — CBC
HCT: 31 % — ABNORMAL LOW (ref 39.0–52.0)
Hemoglobin: 10.9 g/dL — ABNORMAL LOW (ref 13.0–17.0)
MCH: 31.1 pg (ref 26.0–34.0)
MCHC: 35.2 g/dL (ref 30.0–36.0)
MCV: 88.6 fL (ref 80.0–100.0)
Platelets: 266 10*3/uL (ref 150–400)
RBC: 3.5 MIL/uL — ABNORMAL LOW (ref 4.22–5.81)
RDW: 13.3 % (ref 11.5–15.5)
WBC: 9.1 10*3/uL (ref 4.0–10.5)
nRBC: 0 % (ref 0.0–0.2)

## 2022-06-23 LAB — GLUCOSE, CAPILLARY
Glucose-Capillary: 199 mg/dL — ABNORMAL HIGH (ref 70–99)
Glucose-Capillary: 284 mg/dL — ABNORMAL HIGH (ref 70–99)
Glucose-Capillary: 146 mg/dL — ABNORMAL HIGH (ref 70–99)
Glucose-Capillary: 205 mg/dL — ABNORMAL HIGH (ref 70–99)

## 2022-06-23 MED ORDER — LEVETIRACETAM 500 MG PO TABS
1500.0000 mg | ORAL_TABLET | Freq: Two times a day (BID) | ORAL | Status: DC
  Administered 2022-06-23 – 2022-06-24 (×2): 1500 mg via ORAL
  Filled 2022-06-23 (×2): qty 3

## 2022-06-23 MED ORDER — LACOSAMIDE 50 MG PO TABS: 50.0000 mg | ORAL_TABLET | Freq: Two times a day (BID) | ORAL | Status: DC

## 2022-06-23 MED ORDER — LACOSAMIDE 50 MG PO TABS
200.0000 mg | ORAL_TABLET | ORAL | Status: AC
  Administered 2022-06-23: 200 mg via ORAL
  Filled 2022-06-23: qty 4

## 2022-06-23 MED ORDER — LIVING WELL WITH DIABETES BOOK
Freq: Once | Status: AC
  Filled 2022-06-23: qty 1

## 2022-06-23 MED ORDER — LACOSAMIDE 50 MG PO TABS
50.0000 mg | ORAL_TABLET | Freq: Two times a day (BID) | ORAL | Status: DC
  Administered 2022-06-23 – 2022-06-24 (×2): 50 mg via ORAL
  Filled 2022-06-23 (×2): qty 1

## 2022-06-23 MED ORDER — INSULIN STARTER KIT- PEN NEEDLES (ENGLISH)
1.0000 | Freq: Once | Status: AC
  Administered 2022-06-23: 1
  Filled 2022-06-23: qty 1

## 2022-06-23 MED ORDER — TOPIRAMATE 25 MG PO TABS
150.0000 mg | ORAL_TABLET | Freq: Two times a day (BID) | ORAL | Status: DC
  Administered 2022-06-23 – 2022-06-28 (×10): 150 mg via ORAL
  Filled 2022-06-23 (×10): qty 2

## 2022-06-23 NOTE — TOC Initial Note (Signed)
Transition of Care [facility]) - Initial/Assessment Note    Patient Details  Name: Kyle [name] MRN: [id number] Date of Birth: 1955/08/24  [facility] CM/SW Contact:    Ronny Bacon, RN Phone Number: 06/23/2022, 4:19 PM  Clinical Narrative:  Patient presented for heart palpitations. Case manager to bedside in attempt to talk with patient. Verbal consent given to speak with spouse. Phone call to Elease Hashimoto (Spouse) [phone]. Per Elease Hashimoto patient was independent at home and that she works from [employer] - 1pm. Patient has not DME or HH services currently at home. Patient will benefit from PT/OT consult once stabilizes. Case Manager will continue to monitor transition of care needs.         Expected Discharge Plan: Home w Home Health Services Barriers to Discharge: Continued Medical Work up   Patient Goals and CMS Choice Patient states their goals for this hospitalization and ongoing recovery are:: To return home          Expected Discharge Plan and Services   Discharge Planning Services: CM Consult Post Acute Care Choice: Home Health Living arrangements for the past 2 months: Single Family Home                                      Prior Living Arrangements/Services Living arrangements for the past 2 months: Single Family Home Lives with:: Spouse Patient language and need for interpreter reviewed:: Yes Do you feel safe going back to the place where you live?: Yes      Need for Family Participation in Patient Care: Yes (Comment) Care giver support system in place?: Yes (comment)   Criminal Activity/Legal Involvement Pertinent to Current Situation/Hospitalization: No - Comment as needed  Activities of Daily Living Home Assistive Devices/Equipment: None ADL Screening (condition at time of admission) Patient's cognitive ability adequate to safely complete daily activities?: Yes Is the patient deaf or have difficulty hearing?: No Does the patient have  difficulty seeing, even when wearing glasses/contacts?: No Does the patient have difficulty concentrating, remembering, or making decisions?: No Patient able to express need for assistance with ADLs?: No Does the patient have difficulty dressing or bathing?: No Independently performs ADLs?: Yes (appropriate for developmental age) Does the patient have difficulty walking or climbing stairs?: No Weakness of Legs: None Weakness of Arms/Hands: None  Permission Sought/Granted Permission sought to share information with : Case Manager, Family Supports Permission granted to share information with : Yes, Verbal Permission Granted        Permission granted to share info w Relationship: Lyn Henri [phone]     Emotional Assessment Appearance:: Appears older than stated age, Disheveled Attitude/Demeanor/Rapport: Unable to Assess Affect (typically observed): Unable to Assess Orientation: : Oriented to Self Alcohol / Substance Use: Not Applicable Psych Involvement: No (comment)  Admission diagnosis:  SVT (supraventricular tachycardia) [I47.10] Focal seizures (HCC) [R56.9] Visual disturbances [H53.9] Patient Active Problem List   Diagnosis Date Noted   Seizure (HCC) 06/21/2022   Migraines 06/21/2022   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 Pharmacy:  No Pharmacies Listed    Social Determinants of Health (SDOH) Social History: SDOH Screenings    Food Insecurity: No Food Insecurity (06/21/2022)  Housing: Low Risk  (06/21/2022)  Transportation Needs: No Transportation Needs (06/21/2022)  Utilities: Not At Risk (06/21/2022)  Tobacco Use: Medium Risk (06/20/2022)   SDOH Interventions:     Readmission Risk Interventions     No data to display

## 2022-06-23 NOTE — Progress Notes (Signed)
[facility] PROGRESS NOTE   Kyle [name] ZOX:[id number] DOB: May 16, 1955 DOA: 06/20/2022  PCP: No primary care provider on file.  Brief History/Interval Summary: 67 year old married male, independent, medical history significant for type II DM, HTN, HLD, PAD, recently seen in the ED on 05/19/2022 for complaints of visual disturbance x 2 days, unremarkable neurological exam/CT head/MRI brain, seen by neurology who recommended outpatient follow-up, also had episode of SVT with plans for outpatient cardiology follow-up, presented again to the ED on 06/20/2022 with complaints of palpitations, lightheadedness, dizziness, presyncopal.  He was found to be in SVT with heart rate of 177 which self terminated.  In the ED, also noted to have seizure-like activity for which neurology consulted.    Consultants: Neurology.  Cardiology  Procedures: Continuous EEG    Subjective/Interval History: Patient sitting on his bed eating breakfast.  Seems to be mildly distracted.  Denies any complaints.  Mentions that he slept well overnight.  No headaches.    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  Apparently ongoing since April.  PCP started Topamax 4/24 without improvement.   MRI brain 4/3 suspected to have possible seizure focus that would correlate well with his current symptoms.   Neurology was consulted.  Patient placed on continuous EEG Patient is currently on Vimpat Topamax and Keppra. Management per neurology.   Patient/spouse have been instructed that he should not drive x 6 months.  Per spouse, he has not been driving since his ED visit on 05/19/2022.   Paroxysmal SVT: Admission EKG showed SVT at 170.  Telemetry has not captured any episodes since admission.   TSH normal at 2.14.  Echocardiogram shows LVEF of 55 to 60%.  No regional wall motion abnormalities.  Grade 1 diastolic dysfunction was noted.  RV systolic function was  normal.  No significant valvular abnormalities. Patient was seen by cardiology.  They will need outpatient follow-up with consideration to be given to [facility] patch.   Acute kidney injury: Creatinine in Care Everywhere 0.95 in January.  Presented with creatinine of 1.62.  Lisinopril held.   Patient was given IV fluids.  Improvement in renal function noted.   Consider resuming lisinopril and Aldactone at time of discharge.   Poorly controlled type II DM with hyperglycemia: A1c 11.6.  Home metformin and glipizide on hold.  Will likely need insulins at discharge.  Currently on glargine as well as SSI.   CBGs are reasonably well-controlled.     Essential hypertension/hyperaldosteronism Monitor blood pressures closely.  Currently on metoprolol.   Home hydralazine, lisinopril and spironolactone on hold.  Could consider resuming reduced dose lisinopril and spironolactone at discharge.   Hyperlipidemia/hypertriglyceridemia Continue gemfibrozil.  Statin intolerant.  Per cardiology, recommend lipid clinic evaluation and they will arrange follow-up   PAD: Continue aspirin and gemfibrozil.   Normocytic anemia: Baseline hemoglobin in the 12 g range.  Likely dilutional drop in hemoglobin which is stable.  No evidence of overt bleeding.     DVT Prophylaxis: Lovenox Code Status: Full code Family Communication: No family at bedside Disposition Plan: PT and OT evaluation  Status is: Inpatient Remains inpatient appropriate because: Seizures      Medications: Scheduled:  aspirin EC  81 mg Oral Daily   enoxaparin (LOVENOX) injection  40 mg Subcutaneous Q24H   gemfibrozil  600 mg Oral BID AC   insulin aspart  0-15 Units Subcutaneous TID WC   insulin aspart  0-5 Units Subcutaneous QHS   insulin aspart  4 Units Subcutaneous TID WC   insulin glargine-yfgn  15 Units Subcutaneous Daily   lacosamide  200 mg Oral STAT   lacosamide  50 mg Oral BID   levETIRAcetam  1,500 mg Oral BID   metoprolol  tartrate  37.5 mg Oral BID   topiramate  150 mg Oral BID   Continuous: GMW:[id number] **OR** acetaminophen, senna-docusate  Antibiotics: Anti-infectives (From admission, onward)    None       Objective:  Vital Signs  Vitals:   06/22/22 1605 06/22/22 1924 06/23/22 0451 06/23/22 0734  BP: 137/78 (!) 142/83 137/85 137/76  Pulse: 78 87 81 71  Resp: 14 20 16 19   Temp: 98 F (36.7 C)  98.1 F (36.7 C) 98 F (36.7 C)  TempSrc: Oral  Oral Oral  SpO2: 97% 97% 97% 98%  Weight:      Height:        Intake/Output Summary (Last 24 hours) at 06/23/2022 0910 Last data filed at 06/22/2022 1924 Gross per 24 hour  Intake 720 ml  Output 1800 ml  Net -1080 ml   Filed Weights   06/20/22 2210 06/21/22 0320  Weight: 79.4 kg 79.3 kg    General appearance: Awake alert.  In no distress Resp: Clear to auscultation bilaterally.  Normal effort Cardio: S1-S2 is normal regular.  No S3-S4.  No rubs murmurs or bruit GI: Abdomen is soft.  Nontender nondistended.  Bowel sounds are present normal.  No masses organomegaly Extremities: No edema.  Full range of motion of lower extremities. No obvious focal neurological deficits.   Lab Results:  Data Reviewed: I have personally reviewed following labs and reports of the imaging studies  CBC: Recent Labs  Lab 06/20/22 2322 06/21/22 0328 06/23/22 0153  WBC 9.1 9.9 9.1  NEUTROABS 5.4 5.5  --   HGB 10.4* 10.3* 10.9*  HCT 30.6* 30.5* 31.0*  MCV 90.5 91.3 88.6  PLT 263 257 266    Basic Metabolic Panel: Recent Labs  Lab 06/20/22 2322 06/21/22 0328 06/22/22 0710 06/22/22 1025  NA 132* 135 135 138  K 4.6 3.9 4.2 3.8  CL 105 108 108 109  CO2 16* 18* 18* 19*  GLUCOSE 391* 321* 295* 368*  BUN 42* 39* 28* 25*  CREATININE 1.62* 1.31* 1.03 0.96  CALCIUM 9.3 9.3 9.4 9.6  MG 2.1 2.1  --   --     GFR: Estimated Creatinine Clearance: 76 mL/min (by C-G formula based on SCr of 0.96 mg/dL).   HbA1C: Recent Labs    06/21/22 0313   HGBA1C 11.6*    CBG: Recent Labs  Lab 06/22/22 0748 06/22/22 1211 06/22/22 1603 06/22/22 2141 06/23/22 0733  GLUCAP 279* 261* 182* 213* 199*    Lipid Profile: Recent Labs    06/21/22 1340  CHOL 235*  HDL 28*  LDLCALC UNABLE TO CALCULATE IF TRIGLYCERIDE OVER 400 mg/dL  TRIG 474*  CHOLHDL 8.4  LDLDIRECT 127*    Thyroid Function Tests: Recent Labs    06/21/22 0328  TSH 2.143     Radiology Studies: ECHOCARDIOGRAM COMPLETE  Result Date: 06/22/2022    ECHOCARDIOGRAM REPORT   Patient Name:   Kyle [name] Date of Exam: 06/22/2022 Medical Rec #:  [id number]         Height:       70.0 in Accession #:    [id number]        Weight:       174.8 lb Date of Birth:  05-Aug-1955  BSA:          1.971 m Patient Age:    66 years          BP:           115/75 mmHg Patient Gender: M                 HR:           75 bpm. Exam Location:  Inpatient Procedure: 2D Echo, Cardiac Doppler and Color Doppler Indications:    I47.1 SVT  History:        Patient has prior history of Echocardiogram examinations. Risk                 Factors:Hypertension, Diabetes and Dyslipidemia.  Sonographer:    Mike Gip Referring Phys: [phone] ANAND D HONGALGI IMPRESSIONS  1. Left ventricular ejection fraction, by estimation, is 55 to 60%. Left ventricular ejection fraction by 2D MOD biplane is 56.7 %. The left ventricle has normal function. The left ventricle has no regional wall motion abnormalities. Left ventricular diastolic parameters are consistent with Grade I diastolic dysfunction (impaired relaxation).  2. Right ventricular systolic function is normal. The right ventricular size is normal. There is normal pulmonary artery systolic pressure. The estimated right ventricular systolic pressure is 19.0 mmHg.  3. The mitral valve is grossly normal. Trivial mitral valve regurgitation. No evidence of mitral stenosis.  4. The aortic valve is tricuspid. Aortic valve regurgitation is not visualized. No aortic stenosis is  present.  5. The inferior vena cava is normal in size with greater than 50% respiratory variability, suggesting right atrial pressure of 3 mmHg. FINDINGS  Left Ventricle: Left ventricular ejection fraction, by estimation, is 55 to 60%. Left ventricular ejection fraction by 2D MOD biplane is 56.7 %. The left ventricle has normal function. The left ventricle has no regional wall motion abnormalities. The left ventricular internal cavity size was normal in size. There is no left ventricular hypertrophy. Left ventricular diastolic parameters are consistent with Grade I diastolic dysfunction (impaired relaxation). Right Ventricle: The right ventricular size is normal. No increase in right ventricular wall thickness. Right ventricular systolic function is normal. There is normal pulmonary artery systolic pressure. The tricuspid regurgitant velocity is 2.00 m/s, and  with an assumed right atrial pressure of 3 mmHg, the estimated right ventricular systolic pressure is 19.0 mmHg. Left [facility]rium: Left atrial size was normal in size. Right [facility]: Right atrial size was normal in size. Pericardium: There is no evidence of pericardial effusion. Mitral Valve: The mitral valve is grossly normal. Trivial mitral valve regurgitation. No evidence of mitral valve stenosis. Tricuspid Valve: The tricuspid valve is grossly normal. Tricuspid valve regurgitation is trivial. No evidence of tricuspid stenosis. Aortic Valve: The aortic valve is tricuspid. Aortic valve regurgitation is not visualized. No aortic stenosis is present. Pulmonic Valve: The pulmonic valve was grossly normal. Pulmonic valve regurgitation is not visualized. No evidence of pulmonic stenosis. Aorta: The aortic root is normal in size and structure. Venous: The inferior vena cava is normal in size with greater than 50% respiratory variability, suggesting right atrial pressure of 3 mmHg. IAS/Shunts: The atrial septum is grossly normal.  LEFT VENTRICLE PLAX 2D                         Biplane EF (MOD) LVIDd:         4.30 cm         LV Biplane EF:  Left LVIDs:         3.10 cm                          ventricular LV PW:         0.90 cm                          ejection LV IVS:        0.90 cm                          fraction by LVOT diam:     2.00 cm                          2D MOD LV SV:         46                               biplane is LV SV Index:   23                               56.7 %. LVOT Area:     3.14 cm                                Diastology                                LV e' medial:    7.07 cm/s LV Volumes (MOD)               LV E/e' medial:  4.8 LV vol d, MOD    104.0 ml      LV e' lateral:   9.46 cm/s A2C:                           LV E/e' lateral: 3.6 LV vol d, MOD    122.0 ml A4C: LV vol s, MOD    45.1 ml A2C: LV vol s, MOD    52.3 ml A4C: LV SV MOD A2C:   58.9 ml LV SV MOD A4C:   122.0 ml LV SV MOD BP:    64.9 ml RIGHT VENTRICLE             IVC RV Basal diam:  3.80 cm     IVC diam: 1.60 cm RV S prime:     11.50 cm/s TAPSE (M-mode): 1.9 cm LEFT ATRIUM             Index        RIGHT ATRIUM           Index LA diam:        3.30 cm 1.67 cm/m   RA Area:     16.70 cm LA Vol (A2C):   35.7 ml 18.11 ml/m  RA Volume:   43.80 ml  22.22 ml/m LA Vol (A4C):   36.8 ml 18.67 ml/m LA Biplane Vol: 38.8 ml 19.68 ml/m  AORTIC VALVE LVOT Vmax:   85.00 cm/s LVOT Vmean:  53.100 cm/s LVOT VTI:    0.147 m  AORTA Ao Root diam: 3.10  cm MITRAL VALVE               TRICUSPID VALVE MV Area (PHT): 2.11 cm    TR Peak grad:   16.0 mmHg MV Decel Time: 360 msec    TR Vmax:        200.00 cm/s MV E velocity: 33.80 cm/s MV A velocity: 69.40 cm/s  SHUNTS MV E/A ratio:  0.49        Systemic VTI:  0.15 m                            Systemic Diam: 2.00 cm Lennie Odor MD Electronically signed by Lennie Odor MD Signature Date/Time: 06/22/2022/2:01:42 PM    Final    Overnight EEG with video  Result Date: 06/22/2022 Charlsie Quest, MD     06/22/2022 11:01 AM Patient Name: Kyle [name] MRN:  [id number] Epilepsy Attending: Charlsie Quest Referring Physician/Provider: Gordy Councilman, MD Duration: 06/21/2022 0848 to 06/22/2022 0848 Patient history: 67yo M with seizure like activity. EEG to evaluate for seizure. Level of alertness: Awake, asleep AEDs during EEG study: LEV Technical aspects: This EEG study was done with scalp electrodes positioned according to the 10-20 International system of electrode placement. Electrical activity was reviewed with band pass filter of 1-70Hz , sensitivity of 7 uV/mm, display speed of 33mm/sec with a 60Hz  notched filter applied as appropriate. EEG data were recorded continuously and digitally stored.  Video monitoring was available and reviewed as appropriate. Description: The posterior dominant rhythm consists of 9-10 Hz activity of moderate voltage (25-35 uV) seen predominantly in posterior head regions, symmetric and reactive to eye opening and eye closing. Sleep was characterized by vertex waves, sleep spindles (12 to 14 Hz), maximal frontocentral region.  Seizures without clinical signs were noted arising from left posterior quadrant.  At the onset, EEG showed low amplitude 15 to 16 Hz beta activity in left posterior quadrant which then evolved into 9 to 10 Hz alpha activity followed by involving all of left hemisphere and evolving into 3 to 5 Hz theta-delta slowing.  Average 3 seizures were noted every hour, lasting about 2 to 3 minutes each. Hyperventilation and photic stimulation were not performed.   ABNORMALITY -Seizure without clinical signs, left posterior quadrant IMPRESSION: This study showed seizures without clinical signs arising from left posterior quadrant, average 3 seizures per hour, lasting about 2 to 3 minutes each. Priyanka Annabelle Harman       LOS: 2 days   Katlen Seyer [employer] Pager on [email or web address]  06/23/2022, 9:10 AM

## 2022-06-23 NOTE — Procedures (Signed)
Patient Name: Kyle [name]  MRN: [id number]  Epilepsy Attending: Charlsie Quest  Referring Physician/Provider: Gordy Councilman, MD  Duration: 06/22/2022 0848 to 06/23/2022 0848   Patient history: 67yo M with seizure like activity. EEG to evaluate for seizure.    Level of alertness: Awake, asleep   AEDs during EEG study: LEV, TPM, PHT   Technical aspects: This EEG study was done with scalp electrodes positioned according to the 10-20 International system of electrode placement. Electrical activity was reviewed with band pass filter of 1-70Hz , sensitivity of 7 uV/mm, display speed of 86mm/sec with a 60Hz  notched filter applied as appropriate. EEG data were recorded continuously and digitally stored.  Video monitoring was available and reviewed as appropriate.   Description: The posterior dominant rhythm consists of 9-10 Hz activity of moderate voltage (25-35 uV) seen predominantly in posterior head regions, symmetric and reactive to eye opening and eye closing. Sleep was characterized by vertex waves, sleep spindles (12 to 14 Hz), maximal frontocentral region.     Seizures without clinical signs were noted arising from left posterior quadrant.  At the onset, EEG showed low amplitude 15 to 16 Hz beta activity in left posterior quadrant which then evolved into 9 to 10 Hz alpha activity followed by involving all of left hemisphere and evolving into 3 to 5 Hz theta-delta slowing.  Average 2 seizures were noted every hour, lasting about 2 to 3 minutes each.   Hyperventilation and photic stimulation were not performed.      ABNORMALITY -Seizure without clinical signs, left posterior quadrant    IMPRESSION: This study showed seizures without clinical signs arising from left posterior quadrant, average 2 seizures per hour, lasting about 2 to 3 minutes each.   Kween Bacorn Annabelle Harman

## 2022-06-23 NOTE — Plan of Care (Signed)

## 2022-06-23 NOTE — Progress Notes (Signed)
Subjective: No acute events overnight.  Denies feeling groggy, any side effects on medications.  Does not remember having any more seizures.  ROS: negative except above  Examination  Vital signs in last 24 hours: Temp:  [98 F (36.7 C)-98.1 F (36.7 C)] 98 F (36.7 C) (05/08 0734) Pulse Rate:  [71-87] 71 (05/08 0734) Resp:  [14-20] 19 (05/08 0734) BP: (137-142)/(76-85) 137/76 (05/08 0734) SpO2:  [97 %-98 %] 98 % (05/08 0734)  General: lying in bed, NAD Neuro: MS: Alert, oriented, follows commands CN: pupils equal and reactive,  EOMI, face symmetric, tongue midline, normal sensation over face, Motor: 5/5 strength in all 4 extremities Coordination: normal Gait: not tested  Basic Metabolic Panel: Recent Labs  Lab 06/20/22 2322 06/21/22 0328 06/22/22 0710 06/22/22 1025  NA 132* 135 135 138  K 4.6 3.9 4.2 3.8  CL 105 108 108 109  CO2 16* 18* 18* 19*  GLUCOSE 391* 321* 295* 368*  BUN 42* 39* 28* 25*  CREATININE 1.62* 1.31* 1.03 0.96  CALCIUM 9.3 9.3 9.4 9.6  MG 2.1 2.1  --   --     CBC: Recent Labs  Lab 06/20/22 2322 06/21/22 0328 06/23/22 0153  WBC 9.1 9.9 9.1  NEUTROABS 5.4 5.5  --   HGB 10.4* 10.3* 10.9*  HCT 30.6* 30.5* 31.0*  MCV 90.5 91.3 88.6  PLT 263 257 266     Coagulation Studies: No results for input(s): "LABPROT", "INR" in the last 72 hours.  Imaging No new brain imaging overnight  ASSESSMENT AND PLAN: 67 yo M with 2 days of headache and right-sided vision changes, with blood sugars running in the 300s to 400s. EEG showed seizures arising from left posterior quadrant.    New onset seizure - Likely lowered seizure threshold due to hyperglycemia.    Recommendations - Increase Keppra to 1500mg  BID, increase topiramate to 150 mg twice daily -Loaded with Vimpat 200 mg once and start on Vimpat 50 mg twice daily - If seizures persist, can add Klonopin 0.25 mg twice daily - continuee eeg while we are adjusting anti seizure meds - continue seizure  precautions - PRN IV versed for clinical seizure -Discussed plan with Dr. Rito Ehrlich via secure chat   I have spent a total of  35 minutes with the patient reviewing hospital notes,  test results, labs and examining the patient as well as establishing an assessment and plan that was discussed personally with the patient.  > 50% of time was spent in direct patient care.    Kyle [name] Epilepsy [facility] For questions after 5pm please refer to AMION to reach the Neurologist on call

## 2022-06-23 NOTE — Progress Notes (Signed)
LTM maint complete - no skin breakdown under: FP1, fp2

## 2022-06-23 NOTE — Inpatient Diabetes Management (Addendum)
Inpatient Diabetes Program Recommendations  AACE/ADA: New Consensus Statement on Inpatient Glycemic Control (2015)  Target Ranges:  Prepandial:   less than 140 mg/dL      Peak postprandial:   less than 180 mg/dL (1-2 hours)      Critically ill patients:  140 - 180 mg/dL   Lab Results  Component Value Date   GLUCAP 284 (H) 06/23/2022   HGBA1C 11.6 (H) 06/21/2022    Review of Glycemic Control  Latest Reference Range & Units 06/22/22 07:48 06/22/22 12:11 06/22/22 16:03 06/22/22 21:41 06/23/22 07:33 06/23/22 11:49  Glucose-Capillary 70 - 99 mg/dL 161 (H) 096 (H) 045 (H) 213 (H) 199 (H) 284 (H)  (H): Data is abnormally high  Diabetes history: DM2 Outpatient Diabetes medications: Glipizide & Metformin  Current orders for Inpatient glycemic control: Semglee 15 units QD, Novolog 0-15 units TID and 0-5 units QHS, Novolog 4 units TID with meals   Inpatient Diabetes Program Recommendations:    Semglee 18 units QAM Novolog 5 units TID with meals if he consumes at least 50%  Will continue to follow while inpatient.  Thank you, Dulce Sellar, MSN, CDCES Diabetes Coordinator Inpatient Diabetes Program [phone] (team pager from 8a-5p)

## 2022-06-24 DIAGNOSIS — D649 Anemia, unspecified: Secondary | ICD-10-CM | POA: Diagnosis not present

## 2022-06-24 DIAGNOSIS — R569 Unspecified convulsions: Secondary | ICD-10-CM | POA: Diagnosis not present

## 2022-06-24 DIAGNOSIS — I471 Supraventricular tachycardia, unspecified: Secondary | ICD-10-CM | POA: Diagnosis not present

## 2022-06-24 LAB — BASIC METABOLIC PANEL
Anion gap: 10 (ref 5–15)
BUN: 32 mg/dL — ABNORMAL HIGH (ref 8–23)
CO2: 20 mmol/L — ABNORMAL LOW (ref 22–32)
Calcium: 9.4 mg/dL (ref 8.9–10.3)
Chloride: 107 mmol/L (ref 98–111)
Creatinine, Ser: 1.01 mg/dL (ref 0.61–1.24)
GFR, Estimated: >60 mL/min (ref >60)
Glucose, Bld: 201 mg/dL — ABNORMAL HIGH (ref 70–99)
Potassium: 3.4 mmol/L — ABNORMAL LOW (ref 3.5–5.1)
Sodium: 137 mmol/L (ref 135–145)

## 2022-06-24 LAB — GLUCOSE, CAPILLARY
Glucose-Capillary: 205 mg/dL — ABNORMAL HIGH (ref 70–99)
Glucose-Capillary: 212 mg/dL — ABNORMAL HIGH (ref 70–99)
Glucose-Capillary: 262 mg/dL — ABNORMAL HIGH (ref 70–99)
Glucose-Capillary: 255 mg/dL — ABNORMAL HIGH (ref 70–99)

## 2022-06-24 LAB — MAGNESIUM: Magnesium: 2 mg/dL (ref 1.7–2.4)

## 2022-06-24 MED ORDER — LACOSAMIDE 50 MG PO TABS
50.0000 mg | ORAL_TABLET | Freq: Once | ORAL | Status: AC
  Administered 2022-06-24: 50 mg via ORAL
  Filled 2022-06-24: qty 1

## 2022-06-24 MED ORDER — LEVETIRACETAM 500 MG PO TABS
500.0000 mg | ORAL_TABLET | Freq: Once | ORAL | Status: AC
  Administered 2022-06-24: 500 mg via ORAL
  Filled 2022-06-24: qty 1

## 2022-06-24 MED ORDER — POTASSIUM CHLORIDE CRYS ER 20 MEQ PO TBCR
40.0000 meq | EXTENDED_RELEASE_TABLET | Freq: Once | ORAL | Status: AC
  Administered 2022-06-24: 40 meq via ORAL
  Filled 2022-06-24: qty 2

## 2022-06-24 MED ORDER — CLONAZEPAM 0.5 MG PO TABS
0.5000 mg | ORAL_TABLET | Freq: Two times a day (BID) | ORAL | Status: DC
  Administered 2022-06-24 – 2022-06-25 (×3): 0.5 mg via ORAL
  Filled 2022-06-24 (×3): qty 1

## 2022-06-24 MED ORDER — LEVETIRACETAM 500 MG PO TABS
2000.0000 mg | ORAL_TABLET | Freq: Two times a day (BID) | ORAL | Status: DC
  Administered 2022-06-24 – 2022-06-30 (×12): 2000 mg via ORAL
  Filled 2022-06-24 (×12): qty 4

## 2022-06-24 MED ORDER — LACOSAMIDE 50 MG PO TABS
100.0000 mg | ORAL_TABLET | Freq: Two times a day (BID) | ORAL | Status: DC
  Administered 2022-06-24 – 2022-06-25 (×3): 100 mg via ORAL
  Filled 2022-06-24 (×3): qty 2

## 2022-06-24 NOTE — Progress Notes (Signed)
vLTM maintenance  All impedances below 10k.  No skin breakdown noted at all skin sites 

## 2022-06-24 NOTE — Plan of Care (Signed)
  Problem: Education: Goal: Ability to describe self-care measures that may prevent or decrease complications (Diabetes Survival Skills Education) will improve Outcome: Progressing Goal: Individualized Educational Video(s) Outcome: Progressing   Problem: Coping: Goal: Ability to adjust to condition or change in health will improve Outcome: Progressing   Problem: Fluid Volume: Goal: Ability to maintain a balanced intake and output will improve Outcome: Progressing   Problem: Health Behavior/Discharge Planning: Goal: Ability to identify and utilize available resources and services will improve Outcome: Progressing Goal: Ability to manage health-related needs will improve Outcome: Progressing   Problem: Metabolic: Goal: Ability to maintain appropriate glucose levels will improve Outcome: Progressing   Problem: Nutritional: Goal: Maintenance of adequate nutrition will improve Outcome: Progressing Goal: Progress toward achieving an optimal weight will improve Outcome: Progressing   Problem: Skin Integrity: Goal: Risk for impaired skin integrity will decrease Outcome: Progressing   Problem: Tissue Perfusion: Goal: Adequacy of tissue perfusion will improve Outcome: Progressing   Problem: Education: Goal: Knowledge of General Education information will improve Description: Including pain rating scale, medication(s)/side effects and non-pharmacologic comfort measures Outcome: Progressing   Problem: Health Behavior/Discharge Planning: Goal: Ability to manage health-related needs will improve Outcome: Progressing   Problem: Clinical Measurements: Goal: Ability to maintain clinical measurements within normal limits will improve Outcome: Progressing Goal: Will remain free from infection Outcome: Progressing Goal: Diagnostic test results will improve Outcome: Progressing Goal: Respiratory complications will improve Outcome: Progressing Goal: Cardiovascular complication will  be avoided Outcome: Progressing   Problem: Activity: Goal: Risk for activity intolerance will decrease Outcome: Progressing   Problem: Nutrition: Goal: Adequate nutrition will be maintained Outcome: Progressing   Problem: Coping: Goal: Level of anxiety will decrease Outcome: Progressing   Problem: Elimination: Goal: Will not experience complications related to bowel motility Outcome: Progressing Goal: Will not experience complications related to urinary retention Outcome: Progressing   Problem: Pain Managment: Goal: General experience of comfort will improve Outcome: Progressing   Problem: Safety: Goal: Ability to remain free from injury will improve Outcome: Progressing   

## 2022-06-24 NOTE — Progress Notes (Signed)
[facility] PROGRESS NOTE   Kyle [name] ZOX:[id number] DOB: 08-24-1955 DOA: 06/20/2022  PCP: No primary care provider on file.  Brief History/Interval Summary: 67 year old married male, independent, medical history significant for type II DM, HTN, HLD, PAD, recently seen in the ED on 05/19/2022 for complaints of visual disturbance x 2 days, unremarkable neurological exam/CT head/MRI brain, seen by neurology who recommended outpatient follow-up, also had episode of SVT with plans for outpatient cardiology follow-up, presented again to the ED on 06/20/2022 with complaints of palpitations, lightheadedness, dizziness, presyncopal.  He was found to be in SVT with heart rate of 177 which self terminated.  In the ED, also noted to have seizure-like activity for which neurology consulted.    Consultants: Neurology.  Cardiology  Procedures: Continuous EEG    Subjective/Interval History: Patient sitting up in the bed eating breakfast.  Slept well.  Denies any complaints this morning.  Denies any headaches.     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  Apparently ongoing since April.  PCP started Topamax 4/24 without improvement.   MRI brain 4/3 suspected to have possible seizure focus that would correlate well with his current symptoms.   Neurology was consulted.  Patient placed on continuous EEG Patient is currently on Vimpat Topamax and Keppra. Dose was adjusted yesterday. Management per neurology.   Patient/spouse have been instructed that he should not drive x 6 months.  Per spouse, he has not been driving since his ED visit on 05/19/2022.   Paroxysmal SVT: Admission EKG showed SVT at 170.  Telemetry has not captured any episodes since admission.   TSH normal at 2.14.  Echocardiogram shows LVEF of 55 to 60%.  No regional wall motion abnormalities.  Grade 1 diastolic dysfunction was noted.  RV systolic function was normal.  No  significant valvular abnormalities. Patient was seen by cardiology.  They will need outpatient follow-up with consideration to be given to [facility] patch.   Acute kidney injury: Creatinine in Care Everywhere 0.95 in January.  Presented with creatinine of 1.62.  Lisinopril held.   Patient was given IV fluids.  Improvement in renal function noted.   Consider resuming lisinopril and Aldactone at time of discharge.   Poorly controlled type II DM with hyperglycemia: A1c 11.6.  Home metformin and glipizide on hold.  Will likely need insulin at discharge.  Currently on glargine as well as SSI.   CBGs are reasonably well-controlled.     Essential hypertension/hyperaldosteronism/hypokalemia Monitor blood pressures closely.  Currently on metoprolol.   Home hydralazine, lisinopril and spironolactone on hold.  Could consider resuming reduced dose lisinopril and spironolactone at discharge. Replace potassium.  Magnesium is 2.0.   Hyperlipidemia/hypertriglyceridemia Continue gemfibrozil.  Statin intolerant.  Per cardiology, recommend lipid clinic evaluation and they will arrange follow-up   PAD: Continue aspirin and gemfibrozil.   Normocytic anemia: Baseline hemoglobin in the 12 g range.  Likely dilutional drop in hemoglobin which is stable.  No evidence of overt bleeding.     DVT Prophylaxis: Lovenox Code Status: Full code Family Communication: No family at bedside Disposition Plan: PT and OT evaluation  Status is: Inpatient Remains inpatient appropriate because: Seizures      Medications: Scheduled:  aspirin EC  81 mg Oral Daily   enoxaparin (LOVENOX) injection  40 mg Subcutaneous Q24H   gemfibrozil  600 mg Oral BID AC   insulin aspart  0-15 Units Subcutaneous TID WC   insulin aspart  0-5 Units Subcutaneous QHS  insulin aspart  4 Units Subcutaneous TID WC   insulin glargine-yfgn  15 Units Subcutaneous Daily   lacosamide  100 mg Oral BID   lacosamide  50 mg Oral Once   levETIRAcetam   2,000 mg Oral BID   levETIRAcetam  500 mg Oral Once   metoprolol tartrate  37.5 mg Oral BID   topiramate  150 mg Oral BID   Continuous: [id number] **OR** acetaminophen, senna-docusate  Antibiotics: Anti-infectives (From admission, onward)    None       Objective:  Vital Signs  Vitals:   06/23/22 1700 06/23/22 1931 06/24/22 0324 06/24/22 0737  BP: 137/77 (!) 152/88 136/71 (!) 149/92  Pulse: 76 84 82 79  Resp: 16 19 16 19   Temp: 97.8 F (36.6 C) 98.1 F (36.7 C) 98 F (36.7 C) (!) 97.3 F (36.3 C)  TempSrc: Oral Oral Oral Oral  SpO2: 96% 98% 97% 98%  Weight:      Height:        Intake/Output Summary (Last 24 hours) at 06/24/2022 1007 Last data filed at 06/23/2022 1939 Gross per 24 hour  Intake --  Output 1100 ml  Net -1100 ml    Filed Weights   06/20/22 2210 06/21/22 0320  Weight: 79.4 kg 79.3 kg    General appearance: Awake alert.  In no distress Resp: Clear to auscultation bilaterally.  Normal effort Cardio: S1-S2 is normal regular.  No S3-S4.  No rubs murmurs or bruit GI: Abdomen is soft.  Nontender nondistended.  Bowel sounds are present normal.  No masses organomegaly Extremities: No edema.  Full range of motion of lower extremities.   Lab Results:  Data Reviewed: I have personally reviewed following labs and reports of the imaging studies  CBC: Recent Labs  Lab 06/20/22 2322 06/21/22 0328 06/23/22 0153  WBC 9.1 9.9 9.1  NEUTROABS 5.4 5.5  --   HGB 10.4* 10.3* 10.9*  HCT 30.6* 30.5* 31.0*  MCV 90.5 91.3 88.6  PLT 263 257 266     Basic Metabolic Panel: Recent Labs  Lab 06/20/22 2322 06/21/22 0328 06/22/22 0710 06/22/22 1025 06/24/22 0355  NA 132* 135 135 138 137  K 4.6 3.9 4.2 3.8 3.4*  CL 105 108 108 109 107  CO2 16* 18* 18* 19* 20*  GLUCOSE 391* 321* 295* 368* 201*  BUN 42* 39* 28* 25* 32*  CREATININE 1.62* 1.31* 1.03 0.96 1.01  CALCIUM 9.3 9.3 9.4 9.6 9.4  MG 2.1 2.1  --   --  2.0     GFR: Estimated Creatinine  Clearance: 72.3 mL/min (by C-G formula based on SCr of 1.01 mg/dL).   HbA1C: No results for input(s): "HGBA1C" in the last 72 hours.   CBG: Recent Labs  Lab 06/23/22 0733 06/23/22 1149 06/23/22 1602 06/23/22 2209 06/24/22 0734  GLUCAP 199* 284* 146* 205* 205*     Lipid Profile: Recent Labs    06/21/22 1340  CHOL 235*  HDL 28*  LDLCALC UNABLE TO CALCULATE IF TRIGLYCERIDE OVER 400 mg/dL  TRIG 086*  CHOLHDL 8.4  LDLDIRECT 127*     Radiology Studies: ECHOCARDIOGRAM COMPLETE  Result Date: 06/22/2022    ECHOCARDIOGRAM REPORT   Patient Name:   Kyle [name] Date of Exam: 06/22/2022 Medical Rec #:  [id number]         Height:       70.0 in Accession #:    [id number]        Weight:       174.8 lb Date of  Birth:  06-10-1955         BSA:          1.971 m Patient Age:    66 years          BP:           115/75 mmHg Patient Gender: M                 HR:           75 bpm. Exam Location:  Inpatient Procedure: 2D Echo, Cardiac Doppler and Color Doppler Indications:    I47.1 SVT  History:        Patient has prior history of Echocardiogram examinations. Risk                 Factors:Hypertension, Diabetes and Dyslipidemia.  Sonographer:    Mike Gip Referring Phys: [phone] ANAND D HONGALGI IMPRESSIONS  1. Left ventricular ejection fraction, by estimation, is 55 to 60%. Left ventricular ejection fraction by 2D MOD biplane is 56.7 %. The left ventricle has normal function. The left ventricle has no regional wall motion abnormalities. Left ventricular diastolic parameters are consistent with Grade I diastolic dysfunction (impaired relaxation).  2. Right ventricular systolic function is normal. The right ventricular size is normal. There is normal pulmonary artery systolic pressure. The estimated right ventricular systolic pressure is 19.0 mmHg.  3. The mitral valve is grossly normal. Trivial mitral valve regurgitation. No evidence of mitral stenosis.  4. The aortic valve is tricuspid. Aortic valve  regurgitation is not visualized. No aortic stenosis is present.  5. The inferior vena cava is normal in size with greater than 50% respiratory variability, suggesting right atrial pressure of 3 mmHg. FINDINGS  Left Ventricle: Left ventricular ejection fraction, by estimation, is 55 to 60%. Left ventricular ejection fraction by 2D MOD biplane is 56.7 %. The left ventricle has normal function. The left ventricle has no regional wall motion abnormalities. The left ventricular internal cavity size was normal in size. There is no left ventricular hypertrophy. Left ventricular diastolic parameters are consistent with Grade I diastolic dysfunction (impaired relaxation). Right Ventricle: The right ventricular size is normal. No increase in right ventricular wall thickness. Right ventricular systolic function is normal. There is normal pulmonary artery systolic pressure. The tricuspid regurgitant velocity is 2.00 m/s, and  with an assumed right atrial pressure of 3 mmHg, the estimated right ventricular systolic pressure is 19.0 mmHg. Left Atrium: Left atrial size was normal in size. Right Atrium: Right atrial size was normal in size. Pericardium: There is no evidence of pericardial effusion. Mitral Valve: The mitral valve is grossly normal. Trivial mitral valve regurgitation. No evidence of mitral valve stenosis. Tricuspid Valve: The tricuspid valve is grossly normal. Tricuspid valve regurgitation is trivial. No evidence of tricuspid stenosis. Aortic Valve: The aortic valve is tricuspid. Aortic valve regurgitation is not visualized. No aortic stenosis is present. Pulmonic Valve: The pulmonic valve was grossly normal. Pulmonic valve regurgitation is not visualized. No evidence of pulmonic stenosis. Aorta: The aortic root is normal in size and structure. Venous: The inferior vena cava is normal in size with greater than 50% respiratory variability, suggesting right atrial pressure of 3 mmHg. IAS/Shunts: The atrial septum is  grossly normal.  LEFT VENTRICLE PLAX 2D                        Biplane EF (MOD) LVIDd:         4.30 cm  LV Biplane EF:   Left LVIDs:         3.10 cm                          ventricular LV PW:         0.90 cm                          ejection LV IVS:        0.90 cm                          fraction by LVOT diam:     2.00 cm                          2D MOD LV SV:         46                               biplane is LV SV Index:   23                               56.7 %. LVOT Area:     3.14 cm                                Diastology                                LV e' medial:    7.07 cm/s LV Volumes (MOD)               LV E/e' medial:  4.8 LV vol d, MOD    104.0 ml      LV e' lateral:   9.46 cm/s A2C:                           LV E/e' lateral: 3.6 LV vol d, MOD    122.0 ml A4C: LV vol s, MOD    45.1 ml A2C: LV vol s, MOD    52.3 ml A4C: LV SV MOD A2C:   58.9 ml LV SV MOD A4C:   122.0 ml LV SV MOD BP:    64.9 ml RIGHT VENTRICLE             IVC RV Basal diam:  3.80 cm     IVC diam: 1.60 cm RV S prime:     11.50 cm/s TAPSE (M-mode): 1.9 cm LEFT ATRIUM             Index        RIGHT ATRIUM           Index LA diam:        3.30 cm 1.67 cm/m   RA Area:     16.70 cm LA Vol (A2C):   35.7 ml 18.11 ml/m  RA Volume:   43.80 ml  22.22 ml/m LA Vol (A4C):   36.8 ml 18.67 ml/m LA Biplane Vol: 38.8 ml 19.68 ml/m  AORTIC VALVE LVOT Vmax:   85.00 cm/s LVOT Vmean:  53.100 cm/s LVOT VTI:    0.147 m  AORTA Ao Root diam: 3.10 cm MITRAL VALVE               TRICUSPID VALVE MV Area (PHT): 2.11 cm    TR Peak grad:   16.0 mmHg MV Decel Time: 360 msec    TR Vmax:        200.00 cm/s MV E velocity: 33.80 cm/s MV A velocity: 69.40 cm/s  SHUNTS MV E/A ratio:  0.49        Systemic VTI:  0.15 m                            Systemic Diam: 2.00 cm Lennie Odor MD Electronically signed by Lennie Odor MD Signature Date/Time: 06/22/2022/2:01:42 PM    Final    Overnight EEG with video  Result Date: 06/22/2022 Charlsie Quest, MD      06/22/2022 11:01 AM Patient Name: Kyle [name] MRN: [id number] Epilepsy Attending: Charlsie Quest Referring Physician/Provider: Gordy Councilman, MD Duration: 06/21/2022 0848 to 06/22/2022 0848 Patient history: 67yo M with seizure like activity. EEG to evaluate for seizure. Level of alertness: Awake, asleep AEDs during EEG study: LEV Technical aspects: This EEG study was done with scalp electrodes positioned according to the 10-20 International system of electrode placement. Electrical activity was reviewed with band pass filter of 1-70Hz , sensitivity of 7 uV/mm, display speed of 12mm/sec with a 60Hz  notched filter applied as appropriate. EEG data were recorded continuously and digitally stored.  Video monitoring was available and reviewed as appropriate. Description: The posterior dominant rhythm consists of 9-10 Hz activity of moderate voltage (25-35 uV) seen predominantly in posterior head regions, symmetric and reactive to eye opening and eye closing. Sleep was characterized by vertex waves, sleep spindles (12 to 14 Hz), maximal frontocentral region.  Seizures without clinical signs were noted arising from left posterior quadrant.  At the onset, EEG showed low amplitude 15 to 16 Hz beta activity in left posterior quadrant which then evolved into 9 to 10 Hz alpha activity followed by involving all of left hemisphere and evolving into 3 to 5 Hz theta-delta slowing.  Average 3 seizures were noted every hour, lasting about 2 to 3 minutes each. Hyperventilation and photic stimulation were not performed.   ABNORMALITY -Seizure without clinical signs, left posterior quadrant IMPRESSION: This study showed seizures without clinical signs arising from left posterior quadrant, average 3 seizures per hour, lasting about 2 to 3 minutes each. Priyanka Annabelle Harman       LOS: 3 days   Kashara Blocher [employer] on [email or web address]  06/24/2022, 10:07 AM

## 2022-06-24 NOTE — Progress Notes (Signed)
OT Cancellation Note  Patient Details Name: Ellwyn Hasbun MRN: [id number] DOB: [birth year]   Cancelled Treatment:    Reason Eval/Treat Not Completed: Medical issues which prohibited therapy. Per RN, pt is still on continuous EEG and having 1-2 seizures per hour lasting 2-3 minutes each. RN request therapy hold for today. OT will follow up tomorrow if medically appropriate   Galen Manila 06/24/2022, 2:28 PM

## 2022-06-24 NOTE — Care Management Important Message (Signed)
Important Message  Patient Details  Name: Kyle [name] MRN: [id number] Date of Birth: Nov 17, 1955   Medicare Important Message Given:  Yes     Renie Ora 06/24/2022, 9:40 AM

## 2022-06-24 NOTE — Progress Notes (Signed)
PT Cancellation Note  Patient Details Name: Erion Gaunce MRN: [id number] DOB: 12-13-1955   Cancelled Treatment:    Reason Eval/Treat Not Completed: Medical issues which prohibited therapy (Spoke with RN who reported that pt is still on continuous EEG and having 1-2 seizures per hour lasting 2-3 minutes each. RN request therapy holds for today. Will follow up tomorrow if medically appropriate.)   Gladys Damme 06/24/2022, 2:06 PM

## 2022-06-24 NOTE — Progress Notes (Signed)
Subjective: Patient denies any concerns.  Specifically denies feeling any side effects from antiseizure medications, excessive sleepiness.  ROS: negative except above  Examination  Vital signs in last 24 hours: Temp:  [97.3 F (36.3 C)-98.1 F (36.7 C)] 97.3 F (36.3 C) (05/09 0737) Pulse Rate:  [76-84] 79 (05/09 0737) Resp:  [16-19] 19 (05/09 0737) BP: (136-152)/(71-92) 149/92 (05/09 0737) SpO2:  [96 %-98 %] 98 % (05/09 0737)  General: lying in bed, NAD Neuro: MS: Alert, oriented, follows commands CN: pupils equal and reactive,  EOMI, face symmetric, tongue midline, normal sensation over face, Motor: 5/5 strength in all 4 extremities Coordination: normal Gait: not tested  Basic Metabolic Panel: Recent Labs  Lab 06/20/22 2322 06/21/22 0328 06/22/22 0710 06/22/22 1025 06/24/22 0355  NA 132* 135 135 138 137  K 4.6 3.9 4.2 3.8 3.4*  CL 105 108 108 109 107  CO2 16* 18* 18* 19* 20*  GLUCOSE 391* 321* 295* 368* 201*  BUN 42* 39* 28* 25* 32*  CREATININE 1.62* 1.31* 1.03 0.96 1.01  CALCIUM 9.3 9.3 9.4 9.6 9.4  MG 2.1 2.1  --   --  2.0    CBC: Recent Labs  Lab 06/20/22 2322 06/21/22 0328 06/23/22 0153  WBC 9.1 9.9 9.1  NEUTROABS 5.4 5.5  --   HGB 10.4* 10.3* 10.9*  HCT 30.6* 30.5* 31.0*  MCV 90.5 91.3 88.6  PLT 263 257 266     Coagulation Studies: No results for input(s): "LABPROT", "INR" in the last 72 hours.  Imaging No new brain imaging overnight   ASSESSMENT AND PLAN: 67 yo M with 2 days of headache and right-sided vision changes, with blood sugars running in the 300s to 400s. EEG showed seizures arising from left posterior quadrant.    New onset seizure - Likely lowered seizure threshold due to hyperglycemia.    Recommendations -Start Klonopin 0.5 mg twice daily - Increase Keppra to 2000mg  BID, increase Vimpat to 100 mg twice daily -Continue topiramate 150 mg twice daily - If seizures persist, can increase Klonopin 2.5 mg 3 times daily - continuee  eeg while we are adjusting anti seizure meds - continue seizure precautions - PRN IV versed for clinical seizure -Discussed plan with Dr. Rito Ehrlich via secure chat   I have spent a total of  36 minutes with the patient reviewing hospital notes,  test results, labs and examining the patient as well as establishing an assessment and plan that was discussed personally with the patient.  > 50% of time was spent in direct patient care.    Lindie Spruce Epilepsy [facility] For questions after 5pm please refer to AMION to reach the Neurologist on call

## 2022-06-24 NOTE — Procedures (Signed)
Patient Name: Kyle [name]  MRN: [id number]  Epilepsy Attending: Charlsie Quest  Referring Physician/Provider: Gordy Councilman, MD  Duration: 06/23/2022 0848 to 06/24/2022 0848   Patient history: 67yo M with seizure like activity. EEG to evaluate for seizure.    Level of alertness: Awake, asleep   AEDs during EEG study: LEV, TPM, LCM   Technical aspects: This EEG study was done with scalp electrodes positioned according to the 10-20 International system of electrode placement. Electrical activity was reviewed with band pass filter of 1-70Hz , sensitivity of 7 uV/mm, display speed of 33mm/sec with a 60Hz  notched filter applied as appropriate. EEG data were recorded continuously and digitally stored.  Video monitoring was available and reviewed as appropriate.   Description: The posterior dominant rhythm consists of 9-10 Hz activity of moderate voltage (25-35 uV) seen predominantly in posterior head regions, symmetric and reactive to eye opening and eye closing. Sleep was characterized by vertex waves, sleep spindles (12 to 14 Hz), maximal frontocentral region.     Seizures without clinical signs were noted arising from left posterior quadrant.  At the onset, EEG showed low amplitude 15 to 16 Hz beta activity in left posterior quadrant which then evolved into 9 to 10 Hz alpha activity followed by involving all of left hemisphere and evolving into 3 to 5 Hz theta-delta slowing.  Average 2 seizures were noted every hour, lasting about 2 to 3 minutes each. Last seizure was noted on 06/24/2022 at 0656.   Hyperventilation and photic stimulation were not performed.      ABNORMALITY -Seizure without clinical signs, left posterior quadrant    IMPRESSION: This study showed seizures without clinical signs arising from left posterior quadrant, average 2 seizures per hour, lasting about 2 to 3 minutes each. Last seizure was noted on 06/24/2022 at 0656.   Kyle [name]

## 2022-06-25 DIAGNOSIS — I471 Supraventricular tachycardia, unspecified: Secondary | ICD-10-CM | POA: Diagnosis not present

## 2022-06-25 DIAGNOSIS — D649 Anemia, unspecified: Secondary | ICD-10-CM | POA: Diagnosis not present

## 2022-06-25 DIAGNOSIS — I4892 Unspecified atrial flutter: Secondary | ICD-10-CM

## 2022-06-25 DIAGNOSIS — R569 Unspecified convulsions: Secondary | ICD-10-CM | POA: Diagnosis not present

## 2022-06-25 LAB — BASIC METABOLIC PANEL
Anion gap: 11 (ref 5–15)
BUN: 33 mg/dL — ABNORMAL HIGH (ref 8–23)
CO2: 19 mmol/L — ABNORMAL LOW (ref 22–32)
Calcium: 9.8 mg/dL (ref 8.9–10.3)
Chloride: 108 mmol/L (ref 98–111)
Creatinine, Ser: 0.99 mg/dL (ref 0.61–1.24)
GFR, Estimated: >60 mL/min (ref >60)
Glucose, Bld: 215 mg/dL — ABNORMAL HIGH (ref 70–99)
Potassium: 3.6 mmol/L (ref 3.5–5.1)
Sodium: 138 mmol/L (ref 135–145)

## 2022-06-25 LAB — GLUCOSE, CAPILLARY
Glucose-Capillary: 244 mg/dL — ABNORMAL HIGH (ref 70–99)
Glucose-Capillary: 225 mg/dL — ABNORMAL HIGH (ref 70–99)
Glucose-Capillary: 135 mg/dL — ABNORMAL HIGH (ref 70–99)
Glucose-Capillary: 216 mg/dL — ABNORMAL HIGH (ref 70–99)

## 2022-06-25 LAB — METANEPHRINES, PLASMA
Metanephrine, Free: <25 pg/mL (ref 0.0–88.0)
Normetanephrine, Free: 41.9 pg/mL (ref 0.0–285.2)

## 2022-06-25 MED ORDER — POTASSIUM CHLORIDE 10 MEQ/100ML IV SOLN
10.0000 meq | INTRAVENOUS | Status: AC
  Administered 2022-06-25 (×4): 10 meq via INTRAVENOUS
  Filled 2022-06-25 (×4): qty 100

## 2022-06-25 MED ORDER — AMIODARONE HCL IN DEXTROSE 360-4.14 MG/200ML-% IV SOLN: 30.0000 mg/h | INTRAVENOUS | Status: DC

## 2022-06-25 MED ORDER — CLONAZEPAM 0.5 MG PO TABS
0.5000 mg | ORAL_TABLET | Freq: Three times a day (TID) | ORAL | Status: DC
  Administered 2022-06-25 – 2022-06-30 (×15): 0.5 mg via ORAL
  Filled 2022-06-25 (×15): qty 1

## 2022-06-25 MED ORDER — PERAMPANEL 8 MG PO TABS
8.0000 mg | ORAL_TABLET | Freq: Once | ORAL | Status: AC
  Administered 2022-06-25: 8 mg via ORAL
  Filled 2022-06-25: qty 1

## 2022-06-25 MED ORDER — SODIUM CHLORIDE 0.9 % IV BOLUS
1000.0000 mL | Freq: Once | INTRAVENOUS | Status: AC
  Administered 2022-06-25: 1000 mL via INTRAVENOUS

## 2022-06-25 MED ORDER — AMIODARONE HCL IN DEXTROSE 360-4.14 MG/200ML-% IV SOLN
60.0000 mg/h | INTRAVENOUS | Status: DC
  Filled 2022-06-25: qty 200

## 2022-06-25 MED ORDER — MAGNESIUM SULFATE 2 GM/50ML IV SOLN
2.0000 g | Freq: Once | INTRAVENOUS | Status: AC
  Administered 2022-06-25: 2 g via INTRAVENOUS
  Filled 2022-06-25: qty 50

## 2022-06-25 MED ORDER — AMIODARONE HCL 200 MG PO TABS
200.0000 mg | ORAL_TABLET | Freq: Every day | ORAL | Status: DC
  Administered 2022-07-09: 200 mg via ORAL
  Filled 2022-06-25: qty 1

## 2022-06-25 MED ORDER — AMIODARONE HCL 200 MG PO TABS
200.0000 mg | ORAL_TABLET | Freq: Two times a day (BID) | ORAL | Status: AC
  Administered 2022-06-25 – 2022-07-08 (×28): 200 mg via ORAL
  Filled 2022-06-25 (×28): qty 1

## 2022-06-25 MED ORDER — HEPARIN (PORCINE) 25000 UT/250ML-% IV SOLN
1100.0000 [IU]/h | INTRAVENOUS | Status: DC
  Filled 2022-06-25: qty 250

## 2022-06-25 MED ORDER — AMIODARONE LOAD VIA INFUSION
150.0000 mg | Freq: Once | INTRAVENOUS | Status: DC
  Filled 2022-06-25: qty 83.34

## 2022-06-25 MED ORDER — METOPROLOL TARTRATE 5 MG/5ML IV SOLN
5.0000 mg | Freq: Once | INTRAVENOUS | Status: AC
  Administered 2022-06-25: 5 mg via INTRAVENOUS
  Filled 2022-06-25: qty 5

## 2022-06-25 NOTE — Progress Notes (Signed)
OT Cancellation Note  Patient Details Name: Braydon Wisham MRN: [id number] DOB: [birth year]   Cancelled Treatment:    Reason Eval/Treat Not Completed: Patient not medically ready (Per RN, pt with elevated HR and BP and not appropriate for therapy at this time. OT to f/u as able)  06/25/2022  AB, OTR/L  Acute Rehabilitation Services  Office: [phone]   Tristan Schroeder 06/25/2022, 10:21 AM

## 2022-06-25 NOTE — Progress Notes (Signed)
[facility] PROGRESS NOTE   Kyle [name] GYI:[id number] DOB: Jan 14, 1956 DOA: 06/20/2022  PCP: No primary care provider on file.  Brief History/Interval Summary: 67 year old married male, independent, medical history significant for type II DM, HTN, HLD, PAD, recently seen in the ED on 05/19/2022 for complaints of visual disturbance x 2 days, unremarkable neurological exam/CT head/MRI brain, seen by neurology who recommended outpatient follow-up, also had episode of SVT with plans for outpatient cardiology follow-up, presented again to the ED on 06/20/2022 with complaints of palpitations, lightheadedness, dizziness, presyncopal.  He was found to be in SVT with heart rate of 177 which self terminated.  In the ED, also noted to have seizure-like activity for which neurology consulted.    Consultants: Neurology.  Cardiology  Procedures: Continuous EEG    Subjective/Interval History: Patient lying in the bed.  Not noted to be in any discomfort or distress.  He denies any chest pain shortness of breath.    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  Apparently ongoing since April.  PCP started Topamax 4/24 without improvement.   MRI brain 4/3 suspected to have possible seizure focus that would correlate well with his current symptoms.   Neurology was consulted.  Patient placed on continuous EEG Neurology is following and managing.  EEG continues to show subclinical seizure activity.   Currently patient is on the following medications: Clonazepam, lacosamide, Keppra, Topamax, perampanel. Patient/spouse have been instructed that he should not drive x 6 months.  Per spouse, he has not been driving since his ED visit on 05/19/2022.   Paroxysmal SVT/atrial flutter with RVR Admission EKG showed SVT at 170.     TSH normal at 2.14.  Echocardiogram shows LVEF of 55 to 60%.  No regional wall motion abnormalities.  Grade 1 diastolic  dysfunction was noted.  RV systolic function was normal.  No significant valvular abnormalities. Patient was seen by cardiology.  Patient was started on metoprolol.  Outpatient follow-up was arranged. Early this morning patient was noted to be tachycardic.  EKG suggested atrial flutter.  Cardiology reconsulted.  Patient is asymptomatic.  He was given IV metoprolol with no improvement in heart rate but his blood pressure did decrease. Plan is to initiate amiodarone infusion.  Patient started on heparin.  May need to be cardioverted if these measures do not work.   Monitor electrolytes.  Will check his magnesium level.  Give potassium.   Acute kidney injury: Creatinine in Care Everywhere 0.95 in January.  Presented with creatinine of 1.62.  Lisinopril held.   Patient was given IV fluids.  Improvement in renal function noted.   Consider resuming lisinopril and Aldactone at time of discharge.   Poorly controlled type II DM with hyperglycemia: A1c 11.6.  Home metformin and glipizide on hold.  Will likely need insulin at discharge.  Currently on glargine as well as SSI.   CBGs are reasonably well-controlled.     Essential hypertension/hyperaldosteronism/hypokalemia Home hydralazine, lisinopril and spironolactone on hold.   Currently on metoprolol.   Hyperlipidemia/hypertriglyceridemia Continue gemfibrozil.  Statin intolerant.  Per cardiology, recommend lipid clinic evaluation and they will arrange follow-up   PAD: Continue aspirin and gemfibrozil.   Normocytic anemia: Baseline hemoglobin in the 12 g range.  Likely dilutional drop in hemoglobin which is stable.  No evidence of overt bleeding.   Monitor CBCs now that he has been started on IV heparin.   DVT Prophylaxis: IV heparin Code Status: Full code Family Communication: No family  at bedside.  Wife was updated yesterday.  Low sodium today. Disposition Plan: PT and OT evaluation  Status is: Inpatient Remains inpatient appropriate  because: Seizures      Medications: Scheduled:  amiodarone  150 mg Intravenous Once   aspirin EC  81 mg Oral Daily   clonazePAM  0.5 mg Oral TID   gemfibrozil  600 mg Oral BID AC   insulin aspart  0-15 Units Subcutaneous TID WC   insulin aspart  0-5 Units Subcutaneous QHS   insulin aspart  4 Units Subcutaneous TID WC   insulin glargine-yfgn  15 Units Subcutaneous Daily   lacosamide  100 mg Oral BID   levETIRAcetam  2,000 mg Oral BID   metoprolol tartrate  37.5 mg Oral BID   perampanel  8 mg Oral Once   topiramate  150 mg Oral BID   Continuous:  amiodarone     Followed by   amiodarone     heparin     ZOX:[id number] **OR** acetaminophen, senna-docusate  Antibiotics: Anti-infectives (From admission, onward)    None       Objective:  Vital Signs  Vitals:   06/25/22 0638 06/25/22 0743 06/25/22 0923 06/25/22 0934  BP: 95/69 99/71  107/84  Pulse: (!) 145 (!) 148 (!) 157   Resp: 15 16    Temp: 97.7 F (36.5 C) 97.8 F (36.6 C)    TempSrc: Oral Oral    SpO2: 95% 94%    Weight:      Height:        Intake/Output Summary (Last 24 hours) at 06/25/2022 1015 Last data filed at 06/24/2022 2100 Gross per 24 hour  Intake --  Output 1100 ml  Net -1100 ml    Filed Weights   06/20/22 2210 06/21/22 0320  Weight: 79.4 kg 79.3 kg    General appearance: Awake alert.  In no distress Resp: Clear to auscultation bilaterally.  Normal effort Cardio: S1-S2 is tachycardic GI: Abdomen is soft.  Nontender nondistended.  Bowel sounds are present normal.  No masses organomegaly Extremities: No edema.  Full range of motion of lower extremities. Neurologic: Alert and oriented x3.  No focal neurological deficits.     Lab Results:  Data Reviewed: I have personally reviewed following labs and reports of the imaging studies  CBC: Recent Labs  Lab 06/20/22 2322 06/21/22 0328 06/23/22 0153  WBC 9.1 9.9 9.1  NEUTROABS 5.4 5.5  --   HGB 10.4* 10.3* 10.9*  HCT 30.6* 30.5*  31.0*  MCV 90.5 91.3 88.6  PLT 263 257 266     Basic Metabolic Panel: Recent Labs  Lab 06/20/22 2322 06/21/22 0328 06/22/22 0710 06/22/22 1025 06/24/22 0355 06/25/22 0222  NA 132* 135 135 138 137 138  K 4.6 3.9 4.2 3.8 3.4* 3.6  CL 105 108 108 109 107 108  CO2 16* 18* 18* 19* 20* 19*  GLUCOSE 391* 321* 295* 368* 201* 215*  BUN 42* 39* 28* 25* 32* 33*  CREATININE 1.62* 1.31* 1.03 0.96 1.01 0.99  CALCIUM 9.3 9.3 9.4 9.6 9.4 9.8  MG 2.1 2.1  --   --  2.0  --      GFR: Estimated Creatinine Clearance: 73.7 mL/min (by C-G formula based on SCr of 0.99 mg/dL).   CBG: Recent Labs  Lab 06/24/22 0734 06/24/22 1214 06/24/22 1638 06/24/22 2101 06/25/22 0746  GLUCAP 205* 212* 262* 255* 244*     Radiology Studies: No results found.     LOS: 4 days  Dovey Fatzinger [facility]  [facility] [employer] on [email or web address]  06/25/2022, 10:15 AM

## 2022-06-25 NOTE — Progress Notes (Signed)
Tech called up to room by [facility] notifying me that his EEG wasn't able to be read due to EEG wires being off his head. Upon entering room tech noted that all wires were removed. All CT compatible wires were replaced and [facility] is monitoring again as well.

## 2022-06-25 NOTE — Progress Notes (Addendum)
Rounding Note    Patient Name: Kyle [name] Date of Encounter: 06/25/2022  [facility] Cardiologist: Chilton Si, MD   Subjective   Patient resting comfortably in bed. Denies awareness of rapid HR and is not complaining of chest pain or shortness of breath. BP is low but he does not actively feel lightheaded or dizzy.  Inpatient Medications    Scheduled Meds:  aspirin EC  81 mg Oral Daily   clonazePAM  0.5 mg Oral BID   enoxaparin (LOVENOX) injection  40 mg Subcutaneous Q24H   gemfibrozil  600 mg Oral BID AC   insulin aspart  0-15 Units Subcutaneous TID WC   insulin aspart  0-5 Units Subcutaneous QHS   insulin aspart  4 Units Subcutaneous TID WC   insulin glargine-yfgn  15 Units Subcutaneous Daily   lacosamide  100 mg Oral BID   levETIRAcetam  2,000 mg Oral BID   metoprolol tartrate  37.5 mg Oral BID   topiramate  150 mg Oral BID   Continuous Infusions:  PRN Meds: acetaminophen **OR** acetaminophen, senna-docusate   Vital Signs    Vitals:   06/25/22 0537 06/25/22 0555 06/25/22 0638 06/25/22 0743  BP: 100/76 101/82 95/69 99/71   Pulse: (!) 136 (!) 142 (!) 145 (!) 148  Resp: 11 19 15 16   Temp:   97.7 F (36.5 C) 97.8 F (36.6 C)  TempSrc:   Oral Oral  SpO2: 97% 94% 95% 94%  Weight:      Height:        Intake/Output Summary (Last 24 hours) at 06/25/2022 0858 Last data filed at 06/24/2022 2100 Gross per 24 hour  Intake --  Output 1100 ml  Net -1100 ml      06/21/2022    3:20 AM 06/20/2022   10:10 PM 05/19/2022    2:46 PM  Last 3 Weights  Weight (lbs) 174 lb 13.2 oz 175 lb 175 lb  Weight (kg) 79.3 kg 79.379 kg 79.379 kg      Telemetry    Sinus rhythm this morning until onset of tachycardia around 4:30 am. Appears to be 2:1 flutter.  - Personally Reviewed  ECG    Atrial flutter 2:1 conduction - Personally Reviewed  Physical Exam   GEN: No acute distress.   Neck: No JVD Cardiac: regularly and rapid, no murmur noted. Respiratory:  Clear to auscultation bilaterally. GI: Soft, nontender, non-distended  MS: No edema; No deformity. Neuro:  Nonfocal  Psych: Normal affect   Labs    High Sensitivity Troponin:  No results for input(s): "TROPONINIHS" in the last 720 hours.   Chemistry Recent Labs  Lab 06/20/22 2322 06/21/22 0328 06/22/22 0710 06/22/22 1025 06/24/22 0355 06/25/22 0222  NA 132* 135   < > 138 137 138  K 4.6 3.9   < > 3.8 3.4* 3.6  CL 105 108   < > 109 107 108  CO2 16* 18*   < > 19* 20* 19*  GLUCOSE 391* 321*   < > 368* 201* 215*  BUN 42* 39*   < > 25* 32* 33*  CREATININE 1.62* 1.31*   < > 0.96 1.01 0.99  CALCIUM 9.3 9.3   < > 9.6 9.4 9.8  MG 2.1 2.1  --   --  2.0  --   GFRNONAA 46* 59*   < > >60 >60 >60  ANIONGAP 11 9   < > 10 10 11    < > = values in this interval not displayed.  Lipids  Recent Labs  Lab 06/21/22 1340  CHOL 235*  TRIG 454*  HDL 28*  LDLCALC UNABLE TO CALCULATE IF TRIGLYCERIDE OVER 400 mg/dL  CHOLHDL 8.4    Hematology Recent Labs  Lab 06/20/22 2322 06/21/22 0328 06/23/22 0153  WBC 9.1 9.9 9.1  RBC 3.38* 3.34* 3.50*  HGB 10.4* 10.3* 10.9*  HCT 30.6* 30.5* 31.0*  MCV 90.5 91.3 88.6  MCH 30.8 30.8 31.1  MCHC 34.0 33.8 35.2  RDW 13.1 13.2 13.3  PLT 263 257 266   Thyroid  Recent Labs  Lab 06/21/22 0328  TSH 2.143    BNPNo results for input(s): "BNP", "PROBNP" in the last 168 hours.  DDimer No results for input(s): "DDIMER" in the last 168 hours.   Radiology    No results found.  Cardiac Studies   06/22/22 TTE  IMPRESSIONS     1. Left ventricular ejection fraction, by estimation, is 55 to 60%. Left  ventricular ejection fraction by 2D MOD biplane is 56.7 %. The left  ventricle has normal function. The left ventricle has no regional wall  motion abnormalities. Left ventricular  diastolic parameters are consistent with Grade I diastolic dysfunction  (impaired relaxation).   2. Right ventricular systolic function is normal. The right ventricular   size is normal. There is normal pulmonary artery systolic pressure. The  estimated right ventricular systolic pressure is 19.0 mmHg.   3. The mitral valve is grossly normal. Trivial mitral valve  regurgitation. No evidence of mitral stenosis.   4. The aortic valve is tricuspid. Aortic valve regurgitation is not  visualized. No aortic stenosis is present.   5. The inferior vena cava is normal in size with greater than 50%  respiratory variability, suggesting right atrial pressure of 3 mmHg.   FINDINGS   Left Ventricle: Left ventricular ejection fraction, by estimation, is 55  to 60%. Left ventricular ejection fraction by 2D MOD biplane is 56.7 %.  The left ventricle has normal function. The left ventricle has no regional  wall motion abnormalities. The  left ventricular internal cavity size was normal in size. There is no left  ventricular hypertrophy. Left ventricular diastolic parameters are  consistent with Grade I diastolic dysfunction (impaired relaxation).   Right Ventricle: The right ventricular size is normal. No increase in  right ventricular wall thickness. Right ventricular systolic function is  normal. There is normal pulmonary artery systolic pressure. The tricuspid  regurgitant velocity is 2.00 m/s, and   with an assumed right atrial pressure of 3 mmHg, the estimated right  ventricular systolic pressure is 19.0 mmHg.   Left [facility]: Left atrial size was normal in size.   Right [facility]: Right atrial size was normal in size.   Pericardium: There is no evidence of pericardial effusion.   Mitral Valve: The mitral valve is grossly normal. Trivial mitral valve  regurgitation. No evidence of mitral valve stenosis.   Tricuspid Valve: The tricuspid valve is grossly normal. Tricuspid valve  regurgitation is trivial. No evidence of tricuspid stenosis.   Aortic Valve: The aortic valve is tricuspid. Aortic valve regurgitation is  not visualized. No aortic stenosis is present.    Pulmonic Valve: The pulmonic valve was grossly normal. Pulmonic valve  regurgitation is not visualized. No evidence of pulmonic stenosis.   Aorta: The aortic root is normal in size and structure.   Venous: The inferior vena cava is normal in size with greater than 50%  respiratory variability, suggesting right atrial pressure of 3 mmHg.  IAS/Shunts: The atrial septum is grossly normal.    Patient Profile     67 y.o. male with PAD (normal resting ABIs with abnormal exercise ABIs followed at outside facility), poorly controlled DM, HTN, HLD, migraines, recently diagnosed SVT during ER visit 04/2022 (with longstanding hx of palpitations) has recently been having intermittent visual changes and altered mention. Also presented with palpitations so cardiology asked to see for SVT. Neurology has evaluated and is concerned for focal seizures, EEG pending. Recent MRI 05/19/22 was reported to be unrevealing but addended 06/21/22 showing abnormality of small focus of chronic hemosiderin staining.   Assessment & Plan    Visual changes/AMS Seizure activity  Patient admitted with visual changes and AMS. 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 Patient remains on EEG with neurology following, last seizure activity noted on the morning of 06/24/22.  Per IM, neurology   PSVT Atrial flutter, 2:1  Cardiology re-engaged this morning after patient developed recurrent tachycardia, rate/morphology concerning for atrial flutter with 2:1 block. Given anti-seizure regimen, rate/rhythm management will be challenging. Patient currently with borderline low BP after receiving Lopressor IV 5mg . No change to rhythm/rate following administration. Interestingly, no symptoms with RVR this morning.  Continue metoprolol  tartrate 37.5mg  BID as tolerated by BP.  Will discuss need for heparin with Dr. Duke Salvia, unclear flutter vs SVT. NPO in case cardioversion needed. I have discussed options for rate/rhythm control with pharmacy that do not interact with patient's anti-seizure regimen. Given hypotension, will start IV Amiodarone this morning.  Addendum: Notified by nursing staff that patient's HR now 96 following morning metoprolol. Will d/c Amiodarone order.   PAD, HLD  Trig 454, LDL 127 - on gemfibrozil PTA with h/o statin intolerance  continue OP follow-up to consider PCSK9i / statin alternatives. Lipid clinic referral in place   DM  Poorly controlled, A1C 11.6 - per medicine team       For questions or updates, please contact [facility] Please consult [email or web address] for contact info under        Signed, Perlie Gold, PA-C  06/25/2022, 8:58 AM

## 2022-06-25 NOTE — Progress Notes (Signed)
Pt's HR sustaining between 140-150's in what appeared to be SVT, 12-lead EKG obtained that showed A-flutter. Julian Reil, DO made aware. BP dropped to 103/89 MAP 95, pt has been asymptomatic with no CP or shortness of breath; 5 mg IV push metoprolol ordered and administered. BP 84/66 MAP 73; Gardner, DO gave order for 1 liter bolus. Pt is resting comfortably BP 101/82 MAP 90, HR still sustaining at 146. Will continue to monitor.  Bari Edward, RN

## 2022-06-25 NOTE — Progress Notes (Signed)
Subjective: Had tachycardia yesterday.  Denies any concerns this morning.  ROS: negative except above Examination  Vital signs in last 24 hours: Temp:  [97.6 F (36.4 C)-98.1 F (36.7 C)] 97.8 F (36.6 C) (05/10 0743) Pulse Rate:  [83-157] 91 (05/10 1055) Resp:  [11-19] 16 (05/10 0743) BP: (84-133)/(66-89) 122/76 (05/10 1055) SpO2:  [94 %-98 %] 94 % (05/10 0743)  General: lying in bed, NAD Neuro: MS: Alert, orientedX3, follows commands CN: pupils equal and reactive,  EOMI, face symmetric, tongue midline, normal sensation over face, Motor: 5/5 strength in all 4 extremities Coordination: normal Gait: not tested  Basic Metabolic Panel: Recent Labs  Lab 06/20/22 2322 06/21/22 0328 06/22/22 0710 06/22/22 1025 06/24/22 0355 06/25/22 0222  NA 132* 135 135 138 137 138  K 4.6 3.9 4.2 3.8 3.4* 3.6  CL 105 108 108 109 107 108  CO2 16* 18* 18* 19* 20* 19*  GLUCOSE 391* 321* 295* 368* 201* 215*  BUN 42* 39* 28* 25* 32* 33*  CREATININE 1.62* 1.31* 1.03 0.96 1.01 0.99  CALCIUM 9.3 9.3 9.4 9.6 9.4 9.8  MG 2.1 2.1  --   --  2.0  --     CBC: Recent Labs  Lab 06/20/22 2322 06/21/22 0328 06/23/22 0153  WBC 9.1 9.9 9.1  NEUTROABS 5.4 5.5  --   HGB 10.4* 10.3* 10.9*  HCT 30.6* 30.5* 31.0*  MCV 90.5 91.3 88.6  PLT 263 257 266     Coagulation Studies: No results for input(s): "LABPROT", "INR" in the last 72 hours.  Imaging No new brain imaging overnight   ASSESSMENT AND PLAN: 67 yo M with 2 days of headache and right-sided vision changes, with blood sugars running in the 300s to 400s. EEG showed seizures arising from left posterior quadrant.    New onset seizure - Likely lowered seizure threshold due to hyperglycemia.    Recommendations - Give one time dose of perampanel 8mg , can start 2mg  qhs tomorrow if tolerated - Increase Klonopin 0.5 mgTID and topiramate to 200mg  BID - Continue Keppra to 2000mg  BID, increase Vimpat to 100 mg twice daily - continuee eeg while we  are adjusting anti seizure meds - continue seizure precautions - PRN IV versed for clinical seizure -Discussed plan with Dr. Rito Ehrlich via secure chat and called wife to update. All questions answered   I have spent a total of  40 minutes with the patient reviewing hospital notes,  test results, labs and examining the patient as well as establishing an assessment and plan that was discussed personally with the patient.  > 50% of time was spent in direct patient care.      Lindie Spruce Epilepsy [facility] For questions after 5pm please refer to AMION to reach the Neurologist on call

## 2022-06-25 NOTE — Progress Notes (Signed)
   06/25/22 0517  Assess: MEWS Score  Temp 97.6 F (36.4 C)  BP 103/89  MAP (mmHg) 95  ECG Heart Rate (!) 153  Resp 11  Level of Consciousness Alert  SpO2 94 %  O2 Device Room Air  Assess: MEWS Score  MEWS Temp 0  MEWS Systolic 0  MEWS Pulse 3  MEWS RR 1  MEWS LOC 0  MEWS Score 4  MEWS Score Color Red  Assess: if the MEWS score is Yellow or Red  Were vital signs taken at a resting state? Yes  Focused Assessment Change from prior assessment (see assessment flowsheet)  Does the patient meet 2 or more of the SIRS criteria? No  MEWS guidelines implemented  Yes, red  Treat  MEWS Interventions Considered administering scheduled or prn medications/treatments as ordered  Take Vital Signs  Increase Vital Sign Frequency  Red: Q1hr x2, continue Q4hrs until patient remains green for 12hrs  Escalate  MEWS: Escalate Red: Discuss with charge nurse and notify provider. Consider notifying RRT. If remains red for 2 hours consider need for higher level of care  Notify: Charge Nurse/RN  Name of Charge Nurse/RN Notified Lucan, RN  Provider Notification  Provider Name/Title Julian Reil, DO  Date Provider Notified 06/25/22  Time Provider Notified [phone]  Method of Notification Page  Notification Reason Change in status  Provider response See new orders  Date of Provider Response 06/25/22  Time of Provider Response 0520  Assess: SIRS CRITERIA  SIRS Temperature  0  SIRS Pulse 1  SIRS Respirations  0  SIRS WBC 0  SIRS Score Sum  1

## 2022-06-25 NOTE — Progress Notes (Signed)
OT Cancellation Note  Patient Details Name: Kyle [name] MRN: [id number] DOB: 10-03-1955   Cancelled Treatment:    Reason Eval/Treat Not Completed: Medical issues which prohibited therapy (Discussed with PT. patient pulled off EEG leads then began to have garbled speech (likely seizure). OT to continue to f/u with patient as able)  06/25/2022  AB, OTR/L  Acute Rehabilitation Services  Office: [phone]   Tristan Schroeder 06/25/2022, 4:08 PM

## 2022-06-25 NOTE — Evaluation (Signed)
Physical Therapy Evaluation Patient Details Name: Kyle [name] MRN: [id number] DOB: [birth year] Today's Date: 06/25/2022  History of Present Illness  Pt is 67 year old presented to [facility] on  06/20/22 for SVT and visual changes. Neurology consulted and felt likely focal seizures with postictal Todds vision impairment. PMH - DM, HTN, PAD,  Clinical Impression  Pt had pulled EEG leads off and EEG tech preparing to replace them. Assisted pt to EOB. Moved fairly well. Sitting EOB pt began having garbled speech. Returned to supine. Speech returned to normal. Episode lasted ~ 1 minute. EEG tech replacing EEG leads. Difficult to make a dc plan until seizures contolled. Will follow.        Recommendations for follow up therapy are one component of a multi-disciplinary discharge planning process, led by the attending physician.  Recommendations may be updated based on patient status, additional functional criteria and insurance authorization.  Follow Up Recommendations       Assistance Recommended at Discharge Frequent or constant Supervision/Assistance  Patient can return home with the following  A little help with walking and/or transfers;A little help with bathing/dressing/bathroom;Direct supervision/assist for medications management;Direct supervision/assist for financial management;Assist for transportation;Help with stairs or ramp for entrance    Equipment Recommendations Other (comment) (To be determined)  Recommendations for Other Services       Functional Status Assessment Patient has had a recent decline in their functional status and demonstrates the ability to make significant improvements in function in a reasonable and predictable amount of time.     Precautions / Restrictions Precautions Precautions: Fall;Other (comment) Precaution Comments: seizures Restrictions Weight Bearing Restrictions: No      Mobility  Bed Mobility Overal bed mobility: Needs Assistance Bed  Mobility: Supine to Sit, Sit to Supine     Supine to sit: Min guard Sit to supine: Min assist   General bed mobility comments: Assist for safety and lines coming to sit. Assist to initiate returning to supine due to period of garbled speech, confusion    Transfers                   General transfer comment: Did not attempt due to brief period of garbled speech sitting EOB    Ambulation/Gait               General Gait Details: Did not attempt due to brief period of garbled speech sitting EOB  Stairs            Wheelchair Mobility    Modified Rankin (Stroke Patients Only)       Balance Overall balance assessment: Needs assistance Sitting-balance support: No upper extremity supported, Feet supported Sitting balance-Leahy Scale: Fair Sitting balance - Comments: Not tested past fair due to episode of garbled speech                                     Pertinent Vitals/Pain Pain Assessment Pain Assessment: No/denies pain    Home Living Family/patient expects to be discharged to:: Private residence Living Arrangements: Spouse/significant other Available Help at Discharge: Family Type of Home: House Home Access: Stairs to enter Entrance Stairs-Rails: Right Entrance Stairs-Number of Steps: several   Home Layout: One level Home Equipment: Cane - single point      Prior Function Prior Level of Function : Independent/Modified Independent;Driving             Mobility Comments: No assistive device  Hand Dominance        Extremity/Trunk Assessment   Upper Extremity Assessment Upper Extremity Assessment: Defer to OT evaluation    Lower Extremity Assessment Lower Extremity Assessment: Generalized weakness       Communication   Communication: No difficulties  Cognition Arousal/Alertness: Awake/alert Behavior During Therapy: WFL for tasks assessed/performed Overall Cognitive Status: Impaired/Different from  baseline Area of Impairment: Safety/judgement, Awareness                         Safety/Judgement: Decreased awareness of safety, Decreased awareness of deficits Awareness: Intellectual            General Comments      Exercises     Assessment/Plan    PT Assessment Patient needs continued PT services  PT Problem List Decreased strength;Decreased activity tolerance;Decreased balance;Decreased mobility       PT Treatment Interventions DME instruction;Gait training;Stair training;Functional mobility training;Therapeutic activities;Therapeutic exercise;Balance training;Patient/family education    PT Goals (Current goals can be found in the Care Plan section)  Acute Rehab PT Goals Patient Stated Goal: not stated PT Goal Formulation: With patient Time For Goal Achievement: 07/09/22 Potential to Achieve Goals: Good    Frequency Min 1X/week     Co-evaluation               AM-PAC PT "6 Clicks" Mobility  Outcome Measure Help needed turning from your back to your side while in a flat bed without using bedrails?: None Help needed moving from lying on your back to sitting on the side of a flat bed without using bedrails?: A Little Help needed moving to and from a bed to a chair (including a wheelchair)?: Total Help needed standing up from a chair using your arms (e.g., wheelchair or bedside chair)?: Total Help needed to walk in hospital room?: Total Help needed climbing 3-5 steps with a railing? : Total 6 Click Score: 11    End of Session   Activity Tolerance: Treatment limited secondary to medical complications (Comment) (period of garbled speech) Patient left: in bed;with call bell/phone within reach;with bed alarm set;Other (comment) (EEG tech placing leads back on) Nurse Communication: Mobility status;Other (comment) (Period of garbled speech) PT Visit Diagnosis: Other abnormalities of gait and mobility (R26.89);Muscle weakness (generalized) (M62.81)     Time: 9604-5409 PT Time Calculation (min) (ACUTE ONLY): 14 min   Charges:   PT Evaluation $PT Eval Moderate Complexity: 1 Mod          [facility] PT Acute Rehabilitation Services Office [phone]   Angelina Ok [facility] 06/25/2022, 3:15 PM

## 2022-06-25 NOTE — Procedures (Signed)
Patient Name: Kyle [name]  MRN: [id number]  Epilepsy Attending: Charlsie Quest  Referring Physician/Provider: Gordy Councilman, MD  Duration: 06/24/2022 0848 to 06/25/2022 0848   Patient history: 67yo M with seizure like activity. EEG to evaluate for seizure.    Level of alertness: Awake, asleep   AEDs during EEG study: LEV, TPM, LCM   Technical aspects: This EEG study was done with scalp electrodes positioned according to the 10-20 International system of electrode placement. Electrical activity was reviewed with band pass filter of 1-70Hz , sensitivity of 7 uV/mm, display speed of 71mm/sec with a 60Hz  notched filter applied as appropriate. EEG data were recorded continuously and digitally stored.  Video monitoring was available and reviewed as appropriate.   Description: The posterior dominant rhythm consists of 9-10 Hz activity of moderate voltage (25-35 uV) seen predominantly in posterior head regions, symmetric and reactive to eye opening and eye closing. Sleep was characterized by vertex waves, sleep spindles (12 to 14 Hz), maximal frontocentral region.     Seizures without clinical signs were noted arising from left posterior quadrant.  At the onset, EEG showed low amplitude 15 to 16 Hz beta activity in left posterior quadrant which then evolved into 9 to 10 Hz alpha activity followed by involving all of left hemisphere and evolving into 3 to 5 Hz theta-delta slowing.  Average 1.25 seizure was noted every hour, lasting about 2 to 3 minutes each. Last seizure was noted on 06/25/2022 at 0835   Hyperventilation and photic stimulation were not performed.      ABNORMALITY -Seizure without clinical signs, left posterior quadrant    IMPRESSION: This study showed seizures without clinical signs arising from left posterior quadrant, average 1.25 seizures per hour, lasting about 2 to 3 minutes each. Last seizure was noted on 06/24/2022 at 0656.  EEG appears to be improving compared to  previous day.    [name] Annabelle Harman

## 2022-06-26 DIAGNOSIS — D649 Anemia, unspecified: Secondary | ICD-10-CM | POA: Diagnosis not present

## 2022-06-26 DIAGNOSIS — R4182 Altered mental status, unspecified: Secondary | ICD-10-CM

## 2022-06-26 DIAGNOSIS — R569 Unspecified convulsions: Secondary | ICD-10-CM | POA: Diagnosis not present

## 2022-06-26 DIAGNOSIS — I4719 Other supraventricular tachycardia: Secondary | ICD-10-CM

## 2022-06-26 DIAGNOSIS — I471 Supraventricular tachycardia, unspecified: Secondary | ICD-10-CM | POA: Diagnosis not present

## 2022-06-26 LAB — CBC
HCT: 33.9 % — ABNORMAL LOW (ref 39.0–52.0)
Hemoglobin: 11.4 g/dL — ABNORMAL LOW (ref 13.0–17.0)
MCH: 30.2 pg (ref 26.0–34.0)
MCHC: 33.6 g/dL (ref 30.0–36.0)
MCV: 89.9 fL (ref 80.0–100.0)
Platelets: 283 10*3/uL (ref 150–400)
RBC: 3.77 MIL/uL — ABNORMAL LOW (ref 4.22–5.81)
RDW: 14 % (ref 11.5–15.5)
WBC: 10.8 10*3/uL — ABNORMAL HIGH (ref 4.0–10.5)
nRBC: 0 % (ref 0.0–0.2)

## 2022-06-26 LAB — BASIC METABOLIC PANEL
Anion gap: 10 (ref 5–15)
BUN: 28 mg/dL — ABNORMAL HIGH (ref 8–23)
CO2: 17 mmol/L — ABNORMAL LOW (ref 22–32)
Calcium: 9.2 mg/dL (ref 8.9–10.3)
Chloride: 111 mmol/L (ref 98–111)
Creatinine, Ser: 0.88 mg/dL (ref 0.61–1.24)
GFR, Estimated: >60 mL/min (ref >60)
Glucose, Bld: 218 mg/dL — ABNORMAL HIGH (ref 70–99)
Potassium: 3.6 mmol/L (ref 3.5–5.1)
Sodium: 138 mmol/L (ref 135–145)

## 2022-06-26 LAB — GLUCOSE, CAPILLARY
Glucose-Capillary: 326 mg/dL — ABNORMAL HIGH (ref 70–99)
Glucose-Capillary: 290 mg/dL — ABNORMAL HIGH (ref 70–99)
Glucose-Capillary: 182 mg/dL — ABNORMAL HIGH (ref 70–99)
Glucose-Capillary: 218 mg/dL — ABNORMAL HIGH (ref 70–99)

## 2022-06-26 LAB — MAGNESIUM: Magnesium: 2.1 mg/dL (ref 1.7–2.4)

## 2022-06-26 MED ORDER — LACOSAMIDE 50 MG PO TABS
150.0000 mg | ORAL_TABLET | Freq: Two times a day (BID) | ORAL | Status: DC
  Administered 2022-06-26 – 2022-06-27 (×3): 150 mg via ORAL
  Filled 2022-06-26 (×3): qty 3

## 2022-06-26 MED ORDER — METOPROLOL SUCCINATE ER 50 MG PO TB24
50.0000 mg | ORAL_TABLET | Freq: Every day | ORAL | Status: DC
  Administered 2022-06-26 – 2022-06-30 (×5): 50 mg via ORAL
  Filled 2022-06-26 (×5): qty 1

## 2022-06-26 MED ORDER — ENOXAPARIN SODIUM 40 MG/0.4ML IJ SOSY
40.0000 mg | PREFILLED_SYRINGE | INTRAMUSCULAR | Status: DC
  Administered 2022-06-26 – 2022-06-27 (×2): 40 mg via SUBCUTANEOUS
  Filled 2022-06-26 (×2): qty 0.4

## 2022-06-26 MED ORDER — PERAMPANEL 2 MG PO TABS
2.0000 mg | ORAL_TABLET | Freq: Every day | ORAL | Status: DC
  Administered 2022-06-26 – 2022-06-27 (×2): 2 mg via ORAL
  Filled 2022-06-26 (×3): qty 1

## 2022-06-26 NOTE — Progress Notes (Signed)
Subjective:  Mildly confused, no acute complaints  ROS: negative except above Examination  Vital signs in last 24 hours: Temp:  [97.6 F (36.4 C)-98.4 F (36.9 C)] 98.4 F (36.9 C) (05/11 0005) Pulse Rate:  [72-132] 81 (05/11 0839) Resp:  [16-20] 16 (05/11 0431) BP: (94-128)/(68-77) 122/68 (05/11 0839) SpO2:  [94 %-99 %] 95 % (05/11 0431)  General: lying in bed, NAD Neuro: MS: Alert, orientedX3, follows commands with some mild delay at times, reports age as 89 initially  CN: pupils equal and reactive,  VFF, EOMI, face symmetric, tongue midline, normal sensation over face, Motor: No pronator drift.  Coordination: normal finger to nose and heel to shin Gait: not tested  Basic Metabolic Panel: Recent Labs  Lab 06/20/22 2322 06/21/22 0328 06/22/22 0710 06/22/22 1025 06/24/22 0355 06/25/22 0222 06/26/22 0158  NA 132* 135 135 138 137 138 138  K 4.6 3.9 4.2 3.8 3.4* 3.6 3.6  CL 105 108 108 109 107 108 111  CO2 16* 18* 18* 19* 20* 19* 17*  GLUCOSE 391* 321* 295* 368* 201* 215* 218*  BUN 42* 39* 28* 25* 32* 33* 28*  CREATININE 1.62* 1.31* 1.03 0.96 1.01 0.99 0.88  CALCIUM 9.3 9.3 9.4 9.6 9.4 9.8 9.2  MG 2.1 2.1  --   --  2.0  --  2.1     CBC: Recent Labs  Lab 06/20/22 2322 06/21/22 0328 06/23/22 0153 06/26/22 0158  WBC 9.1 9.9 9.1 10.8*  NEUTROABS 5.4 5.5  --   --   HGB 10.4* 10.3* 10.9* 11.4*  HCT 30.6* 30.5* 31.0* 33.9*  MCV 90.5 91.3 88.6 89.9  PLT 263 257 266 283      Coagulation Studies: No results for input(s): "LABPROT", "INR" in the last 72 hours.  Imaging No new brain imaging overnight   ASSESSMENT AND PLAN: 67 yo M with 2 days of headache and right-sided vision changes, with blood sugars running in the 300s to 400s. EEG showed seizures arising from left posterior quadrant.    New onset seizure - Likely lowered seizure threshold due to hyperglycemia.    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 Rito Ehrlich via secure chat and wife at bedside. All questions answered   Kyle Dare MD-PhD [facility] [phone] Available 7 AM to 7 PM, outside these hours please contact Neurologist on call listed on AMION

## 2022-06-26 NOTE — Progress Notes (Signed)
Rounding Note    Patient Name: Kyle [name] Date of Encounter: 06/26/2022  [facility] Cardiologist: Chilton Si, MD   Subjective   On EEG, fatigued  Inpatient Medications    Scheduled Meds:  amiodarone  200 mg Oral BID   [START ON 07/09/2022] amiodarone  200 mg Oral Daily   aspirin EC  81 mg Oral Daily   clonazePAM  0.5 mg Oral TID   enoxaparin (LOVENOX) injection  40 mg Subcutaneous Q24H   gemfibrozil  600 mg Oral BID AC   insulin aspart  0-15 Units Subcutaneous TID WC   insulin aspart  0-5 Units Subcutaneous QHS   insulin aspart  4 Units Subcutaneous TID WC   insulin glargine-yfgn  15 Units Subcutaneous Daily   lacosamide  150 mg Oral BID   levETIRAcetam  2,000 mg Oral BID   metoprolol tartrate  37.5 mg Oral BID   perampanel  2 mg Oral QHS   topiramate  150 mg Oral BID   Continuous Infusions:  PRN Meds: acetaminophen **OR** acetaminophen, senna-docusate   Vital Signs    Vitals:   06/26/22 0005 06/26/22 0431 06/26/22 0839 06/26/22 1145  BP: 109/74 110/75 122/68 133/75  Pulse: 75 73 81 75  Resp: 18 16  16   Temp: 98.4 F (36.9 C)   97.7 F (36.5 C)  TempSrc: Oral   Oral  SpO2: 95% 95%  96%  Weight:      Height:        Intake/Output Summary (Last 24 hours) at 06/26/2022 1209 Last data filed at 06/26/2022 0900 Gross per 24 hour  Intake 253.2 ml  Output 1875 ml  Net -1621.8 ml      06/21/2022    3:20 AM 06/20/2022   10:10 PM 05/19/2022    2:46 PM  Last 3 Weights  Weight (lbs) 174 lb 13.2 oz 175 lb 175 lb  Weight (kg) 79.3 kg 79.379 kg 79.379 kg      Telemetry    Sinus rhythm, SVT that resolved  - Personally Reviewed  ECG    NA - Personally Reviewed  Physical Exam   GEN: No acute distress.   Neck: No JVD Cardiac: regularly and rapid, no murmur noted. Respiratory: Clear to auscultation bilaterally. GI:  non-distended  MS: No edema; No deformity. Neuro:  Nonfocal  Psych: Normal affect   Labs    High Sensitivity  Troponin:  No results for input(s): "TROPONINIHS" in the last 720 hours.   Chemistry Recent Labs  Lab 06/21/22 0328 06/22/22 0710 06/24/22 0355 06/25/22 0222 06/26/22 0158  NA 135   < > 137 138 138  K 3.9   < > 3.4* 3.6 3.6  CL 108   < > 107 108 111  CO2 18*   < > 20* 19* 17*  GLUCOSE 321*   < > 201* 215* 218*  BUN 39*   < > 32* 33* 28*  CREATININE 1.31*   < > 1.01 0.99 0.88  CALCIUM 9.3   < > 9.4 9.8 9.2  MG 2.1  --  2.0  --  2.1  GFRNONAA 59*   < > >60 >60 >60  ANIONGAP 9   < > 10 11 10    < > = values in this interval not displayed.    Lipids  Recent Labs  Lab 06/21/22 1340  CHOL 235*  TRIG 454*  HDL 28*  LDLCALC UNABLE TO CALCULATE IF TRIGLYCERIDE OVER 400 mg/dL  CHOLHDL 8.4    Hematology Recent  Labs  Lab 06/21/22 0328 06/23/22 0153 06/26/22 0158  WBC 9.9 9.1 10.8*  RBC 3.34* 3.50* 3.77*  HGB 10.3* 10.9* 11.4*  HCT 30.5* 31.0* 33.9*  MCV 91.3 88.6 89.9  MCH 30.8 31.1 30.2  MCHC 33.8 35.2 33.6  RDW 13.2 13.3 14.0  PLT 257 266 283   Thyroid  Recent Labs  Lab 06/21/22 0328  TSH 2.143    BNPNo results for input(s): "BNP", "PROBNP" in the last 168 hours.  DDimer No results for input(s): "DDIMER" in the last 168 hours.   Radiology    No results found.  Cardiac Studies   06/22/22 TTE  IMPRESSIONS     1. Left ventricular ejection fraction, by estimation, is 55 to 60%. Left  ventricular ejection fraction by 2D MOD biplane is 56.7 %. The left  ventricle has normal function. The left ventricle has no regional wall  motion abnormalities. Left ventricular  diastolic parameters are consistent with Grade I diastolic dysfunction  (impaired relaxation).   2. Right ventricular systolic function is normal. The right ventricular  size is normal. There is normal pulmonary artery systolic pressure. The  estimated right ventricular systolic pressure is 19.0 mmHg.   3. The mitral valve is grossly normal. Trivial mitral valve  regurgitation. No evidence of  mitral stenosis.   4. The aortic valve is tricuspid. Aortic valve regurgitation is not  visualized. No aortic stenosis is present.   5. The inferior vena cava is normal in size with greater than 50%  respiratory variability, suggesting right atrial pressure of 3 mmHg.   FINDINGS   Left Ventricle: Left ventricular ejection fraction, by estimation, is 55  to 60%. Left ventricular ejection fraction by 2D MOD biplane is 56.7 %.  The left ventricle has normal function. The left ventricle has no regional  wall motion abnormalities. The  left ventricular internal cavity size was normal in size. There is no left  ventricular hypertrophy. Left ventricular diastolic parameters are  consistent with Grade I diastolic dysfunction (impaired relaxation).   Right Ventricle: The right ventricular size is normal. No increase in  right ventricular wall thickness. Right ventricular systolic function is  normal. There is normal pulmonary artery systolic pressure. The tricuspid  regurgitant velocity is 2.00 m/s, and   with an assumed right atrial pressure of 3 mmHg, the estimated right  ventricular systolic pressure is 19.0 mmHg.   Left Atrium: Left atrial size was normal in size.   Right Atrium: Right atrial size was normal in size.   Pericardium: There is no evidence of pericardial effusion.   Mitral Valve: The mitral valve is grossly normal. Trivial mitral valve  regurgitation. No evidence of mitral valve stenosis.   Tricuspid Valve: The tricuspid valve is grossly normal. Tricuspid valve  regurgitation is trivial. No evidence of tricuspid stenosis.   Aortic Valve: The aortic valve is tricuspid. Aortic valve regurgitation is  not visualized. No aortic stenosis is present.   Pulmonic Valve: The pulmonic valve was grossly normal. Pulmonic valve  regurgitation is not visualized. No evidence of pulmonic stenosis.   Aorta: The aortic root is normal in size and structure.   Venous: The inferior vena  cava is normal in size with greater than 50%  respiratory variability, suggesting right atrial pressure of 3 mmHg.   IAS/Shunts: The atrial septum is grossly normal.    Patient Profile     67 y.o. male with PAD (normal resting ABIs with abnormal exercise ABIs followed at outside facility), poorly controlled DM,  HTN, HLD, migraines, recently diagnosed SVT during ER visit 04/2022 (with longstanding hx of palpitations) has recently been having intermittent visual changes and altered mention. Also presented with palpitations so cardiology asked to see for SVT. Neurology has evaluated and is concerned for focal seizures, EEG pending. Recent MRI 05/19/22 was reported to be unrevealing but addended 06/21/22 showing abnormality of small focus of chronic hemosiderin staining.   Assessment & Plan    Visual changes/AMS Seizure activity  Patient admitted with visual changes and AMS. 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 Patient remains on EEG with neurology following, last seizure activity noted on the morning of 06/24/22.  Per IM, neurology Undergoing EEG    SVT C/f AVNRT Patient was admitted in SVT with rates in the 170s. He reports intermittent episodes of this since he was much younger. In general it has been well-controlled. However he had an episode last month that lasted longer than usual. He had another long episode in the last 24 hours. He now converted back to sinus rhythm. He was hypotensive when in SVT with rates into the 170s. Stated on IV amiodarone   In sinus rhythm Continue oral amiodarone change metoprolol tartrate 37.5mg  BID to metop 50 mg XL    PAD, HLD  Trig 454, LDL 127 - on gemfibrozil PTA with h/o statin intolerance  continue OP follow-up to consider PCSK9i / statin  alternatives. Lipid clinic referral in place   DM  Poorly controlled, A1C 11.6 - per medicine team     Will follow peripherally over the weekend.  For questions or updates, please contact [facility] Please consult [email or web address] for contact info under        Signed, Maisie Fus, MD  06/26/2022, 12:09 PM

## 2022-06-26 NOTE — Procedures (Signed)
Patient Name: Kyle [name]  MRN: [id number]  Epilepsy Attending: Charlsie Quest  Referring Physician/Provider: Gordy Councilman, MD  Duration: 06/25/2022 0848 to 06/26/2022 1148   Patient history: 67yo M with seizure like activity. EEG to evaluate for seizure.    Level of alertness: Awake, asleep   AEDs during EEG study: LEV, TPM, LCM, Clonazepam, Perampanel   Technical aspects: This EEG study was done with scalp electrodes positioned according to the 10-20 International system of electrode placement. Electrical activity was reviewed with band pass filter of 1-70Hz , sensitivity of 7 uV/mm, display speed of 66mm/sec with a 60Hz  notched filter applied as appropriate. EEG data were recorded continuously and digitally stored.  Video monitoring was available and reviewed as appropriate.   Description: The posterior dominant rhythm consists of 9-10 Hz activity of moderate voltage (25-35 uV) seen predominantly in posterior head regions, symmetric and reactive to eye opening and eye closing. Sleep was characterized by vertex waves, sleep spindles (12 to 14 Hz), maximal frontocentral region.     Seizures without clinical signs were noted arising from left posterior quadrant.  At the onset, EEG showed low amplitude 15 to 16 Hz beta activity in left posterior quadrant which then evolved into 9 to 10 Hz alpha activity followed by involving all of left hemisphere and evolving into 3 to 5 Hz theta-delta slowing.  Average 1 seizure was noted every hour, lasting about 2 to 3 minutes each.   Hyperventilation and photic stimulation were not performed.     EEG was difficult to interpret between 06/25/2022 1219 to 1454 due to significant electrode artifact.   ABNORMALITY -Seizure without clinical signs, left posterior quadrant    IMPRESSION: This study showed seizures without clinical signs arising from left posterior quadrant, average 1 seizures per hour, lasting about 2 to 3 minutes each.   Valbona Slabach Annabelle Harman

## 2022-06-26 NOTE — Progress Notes (Signed)
[facility] PROGRESS NOTE   Kyle [name] RUE:[id number] DOB: 1956-02-01 DOA: 06/20/2022  PCP: No primary care provider on file.  Brief History/Interval Summary: 67 year old married male, independent, medical history significant for type II DM, HTN, HLD, PAD, recently seen in the ED on 05/19/2022 for complaints of visual disturbance x 2 days, unremarkable neurological exam/CT head/MRI brain, seen by neurology who recommended outpatient follow-up, also had episode of SVT with plans for outpatient cardiology follow-up, presented again to the ED on 06/20/2022 with complaints of palpitations, lightheadedness, dizziness, presyncopal.  He was found to be in SVT with heart rate of 177 which self terminated.  In the ED, also noted to have seizure-like activity for which neurology consulted.    Consultants: Neurology.  Cardiology  Procedures: Continuous EEG    Subjective/Interval History: Patient lying in the bed.  Noted to have garbled speech this morning.  Suspect he has got some degree of expressive aphasia.  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  Apparently ongoing since April.  PCP started Topamax 4/24 without improvement.   MRI brain from 4/3 showed small curvilinear focus of susceptibility artifact involving the left occipital lobe likely a small focus of chronic hemosiderin staining.  Neurology was consulted.  Patient placed on continuous EEG Neurology is following and managing.  EEG continues to send she is under activity.  Patient with some degree of expressive aphasia but no other neurological deficits. Currently patient is on the following medications: Clonazepam, lacosamide, Keppra, Topamax, perampanel. Patient/spouse have been instructed that he should not drive x 6 months.  Per spouse, he has not been driving since his ED visit on 05/19/2022.   Paroxysmal SVT/AVNRT Admission EKG showed SVT at 170.     TSH normal  at 2.14.  Echocardiogram shows LVEF of 55 to 60%.  No regional wall motion abnormalities.  Grade 1 diastolic dysfunction was noted.  RV systolic function was normal.  No significant valvular abnormalities. Patient was seen by cardiology.  Patient was started on metoprolol.   On the morning of 5/10 patient again noted to be tachycardic.  EKG initially suggested atrial flutter.  Patient seen by cardiology.  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  Patient converted to sinus rhythm after he was given his a.m. dose of metoprolol yesterday. Patient started on oral amiodarone.  No plans for anticoagulation noted. Monitor electrolytes.  Supplement potassium.  Magnesium is 2.1.   Acute kidney injury: Creatinine in Care Everywhere 0.95 in January.  Presented with creatinine of 1.62.  Lisinopril held.   Patient was given IV fluids.  Improvement in renal function noted.   Consider resuming lisinopril and Aldactone at time of discharge.   Poorly controlled type II DM with hyperglycemia: A1c 11.6.  Home metformin and glipizide on hold.  Will likely need insulin at discharge.  Currently on glargine as well as SSI.   CBGs are reasonably well-controlled.     Essential hypertension/hyperaldosteronism/hypokalemia Home hydralazine, lisinopril and spironolactone on hold.   Currently on metoprolol.   Hyperlipidemia/hypertriglyceridemia Continue gemfibrozil.  Statin intolerant.  Per cardiology, recommend lipid clinic evaluation and they will arrange follow-up   PAD: Continue aspirin and gemfibrozil.   Normocytic anemia: Baseline hemoglobin in the 12 g range.  Likely dilutional drop in hemoglobin which is stable.  No evidence of overt bleeding.     DVT Prophylaxis: Reinitiate Lovenox Code Status: Full code Family Communication: No family at bedside.  Wife being updated on an every other day  basis. Disposition Plan: PT and OT evaluation  Status is: Inpatient Remains inpatient appropriate because:  Seizures      Medications: Scheduled:  amiodarone  200 mg Oral BID   [START ON 07/09/2022] amiodarone  200 mg Oral Daily   aspirin EC  81 mg Oral Daily   clonazePAM  0.5 mg Oral TID   gemfibrozil  600 mg Oral BID AC   insulin aspart  0-15 Units Subcutaneous TID WC   insulin aspart  0-5 Units Subcutaneous QHS   insulin aspart  4 Units Subcutaneous TID WC   insulin glargine-yfgn  15 Units Subcutaneous Daily   lacosamide  150 mg Oral BID   levETIRAcetam  2,000 mg Oral BID   metoprolol tartrate  37.5 mg Oral BID   perampanel  2 mg Oral QHS   topiramate  150 mg Oral BID   Continuous:  [id number] **OR** acetaminophen, senna-docusate  Antibiotics: Anti-infectives (From admission, onward)    None       Objective:  Vital Signs  Vitals:   06/25/22 2241 06/26/22 0005 06/26/22 0431 06/26/22 0839  BP: 100/68 109/74 110/75 122/68  Pulse: 91 75 73 81  Resp:  18 16   Temp:  98.4 F (36.9 C)    TempSrc:  Oral    SpO2:  95% 95%   Weight:      Height:        Intake/Output Summary (Last 24 hours) at 06/26/2022 0935 Last data filed at 06/26/2022 0900 Gross per 24 hour  Intake 253.2 ml  Output 1875 ml  Net -1621.8 ml    Filed Weights   06/20/22 2210 06/21/22 0320  Weight: 79.4 kg 79.3 kg    General appearance: Awake alert.  In no distress.  Mildly distracted Resp: Clear to auscultation bilaterally.  Normal effort Cardio: S1-S2 is normal regular.  No S3-S4.  No rubs murmurs or bruit GI: Abdomen is soft.  Nontender nondistended.  Bowel sounds are present normal.  No masses organomegaly Extremities: No edema.  Full range of motion of lower extremities. Neurologic: No focal neurological deficits.     Lab Results:  Data Reviewed: I have personally reviewed following labs and reports of the imaging studies  CBC: Recent Labs  Lab 06/20/22 2322 06/21/22 0328 06/23/22 0153 06/26/22 0158  WBC 9.1 9.9 9.1 10.8*  NEUTROABS 5.4 5.5  --   --   HGB 10.4* 10.3*  10.9* 11.4*  HCT 30.6* 30.5* 31.0* 33.9*  MCV 90.5 91.3 88.6 89.9  PLT 263 257 266 283     Basic Metabolic Panel: Recent Labs  Lab 06/20/22 2322 06/21/22 0328 06/22/22 0710 06/22/22 1025 06/24/22 0355 06/25/22 0222 06/26/22 0158  NA 132* 135 135 138 137 138 138  K 4.6 3.9 4.2 3.8 3.4* 3.6 3.6  CL 105 108 108 109 107 108 111  CO2 16* 18* 18* 19* 20* 19* 17*  GLUCOSE 391* 321* 295* 368* 201* 215* 218*  BUN 42* 39* 28* 25* 32* 33* 28*  CREATININE 1.62* 1.31* 1.03 0.96 1.01 0.99 0.88  CALCIUM 9.3 9.3 9.4 9.6 9.4 9.8 9.2  MG 2.1 2.1  --   --  2.0  --  2.1     GFR: Estimated Creatinine Clearance: 83 mL/min (by C-G formula based on SCr of 0.88 mg/dL).   CBG: Recent Labs  Lab 06/25/22 0746 06/25/22 1215 06/25/22 1608 06/25/22 2237 06/26/22 0808  GLUCAP 244* 225* 135* 216* 326*     Radiology Studies: No results found.  LOS: 5 days   Daniil Labarge [employer] on [email or web address]  06/26/2022, 9:35 AM

## 2022-06-27 ENCOUNTER — Inpatient Hospital Stay (HOSPITAL_COMMUNITY): Payer: Medicare Other

## 2022-06-27 DIAGNOSIS — R4182 Altered mental status, unspecified: Secondary | ICD-10-CM | POA: Diagnosis not present

## 2022-06-27 DIAGNOSIS — I471 Supraventricular tachycardia, unspecified: Secondary | ICD-10-CM | POA: Diagnosis not present

## 2022-06-27 DIAGNOSIS — R569 Unspecified convulsions: Secondary | ICD-10-CM | POA: Diagnosis not present

## 2022-06-27 DIAGNOSIS — D649 Anemia, unspecified: Secondary | ICD-10-CM | POA: Diagnosis not present

## 2022-06-27 DIAGNOSIS — I4719 Other supraventricular tachycardia: Secondary | ICD-10-CM | POA: Diagnosis not present

## 2022-06-27 LAB — GLUCOSE, CAPILLARY
Glucose-Capillary: 235 mg/dL — ABNORMAL HIGH (ref 70–99)
Glucose-Capillary: 283 mg/dL — ABNORMAL HIGH (ref 70–99)
Glucose-Capillary: 198 mg/dL — ABNORMAL HIGH (ref 70–99)
Glucose-Capillary: 214 mg/dL — ABNORMAL HIGH (ref 70–99)

## 2022-06-27 LAB — HIV ANTIBODY (ROUTINE TESTING W REFLEX): HIV Screen 4th Generation wRfx: NONREACTIVE

## 2022-06-27 LAB — BASIC METABOLIC PANEL
Anion gap: 12 (ref 5–15)
BUN: 32 mg/dL — ABNORMAL HIGH (ref 8–23)
CO2: 19 mmol/L — ABNORMAL LOW (ref 22–32)
Calcium: 9.5 mg/dL (ref 8.9–10.3)
Chloride: 108 mmol/L (ref 98–111)
Creatinine, Ser: 1 mg/dL (ref 0.61–1.24)
GFR, Estimated: >60 mL/min (ref >60)
Glucose, Bld: 187 mg/dL — ABNORMAL HIGH (ref 70–99)
Potassium: 3.5 mmol/L (ref 3.5–5.1)
Sodium: 139 mmol/L (ref 135–145)

## 2022-06-27 LAB — CBC
HCT: 33.1 % — ABNORMAL LOW (ref 39.0–52.0)
Hemoglobin: 11.4 g/dL — ABNORMAL LOW (ref 13.0–17.0)
MCH: 31.1 pg (ref 26.0–34.0)
MCHC: 34.4 g/dL (ref 30.0–36.0)
MCV: 90.2 fL (ref 80.0–100.0)
Platelets: 282 10*3/uL (ref 150–400)
RBC: 3.67 MIL/uL — ABNORMAL LOW (ref 4.22–5.81)
RDW: 14 % (ref 11.5–15.5)
WBC: 9.3 10*3/uL (ref 4.0–10.5)
nRBC: 0 % (ref 0.0–0.2)

## 2022-06-27 LAB — C-REACTIVE PROTEIN: CRP: 1.6 mg/dL — ABNORMAL HIGH (ref <1.0)

## 2022-06-27 LAB — SEDIMENTATION RATE: Sed Rate: 42 mm/hr — ABNORMAL HIGH (ref 0–16)

## 2022-06-27 LAB — AMMONIA: Ammonia: 59 umol/L — ABNORMAL HIGH (ref 9–35)

## 2022-06-27 LAB — VITAMIN B12: Vitamin B-12: 245 pg/mL (ref 180–914)

## 2022-06-27 MED ORDER — IOHEXOL 350 MG/ML SOLN
75.0000 mL | Freq: Once | INTRAVENOUS | Status: AC | PRN
  Administered 2022-06-27: 75 mL via INTRAVENOUS

## 2022-06-27 MED ORDER — POTASSIUM CHLORIDE CRYS ER 20 MEQ PO TBCR
40.0000 meq | EXTENDED_RELEASE_TABLET | Freq: Once | ORAL | Status: AC
  Administered 2022-06-27: 40 meq via ORAL
  Filled 2022-06-27: qty 2

## 2022-06-27 MED ORDER — INSULIN GLARGINE-YFGN 100 UNIT/ML ~~LOC~~ SOLN
5.0000 [IU] | Freq: Once | SUBCUTANEOUS | Status: AC
  Administered 2022-06-27: 5 [IU] via SUBCUTANEOUS
  Filled 2022-06-27: qty 0.05

## 2022-06-27 MED ORDER — LACOSAMIDE 50 MG PO TABS
200.0000 mg | ORAL_TABLET | Freq: Two times a day (BID) | ORAL | Status: DC
  Administered 2022-06-27 – 2022-07-09 (×24): 200 mg via ORAL
  Filled 2022-06-27 (×24): qty 4

## 2022-06-27 MED ORDER — INSULIN GLARGINE-YFGN 100 UNIT/ML ~~LOC~~ SOLN
18.0000 [IU] | Freq: Every day | SUBCUTANEOUS | Status: DC
  Administered 2022-06-28 – 2022-06-30 (×3): 18 [IU] via SUBCUTANEOUS
  Filled 2022-06-27 (×4): qty 0.18

## 2022-06-27 MED ORDER — ENOXAPARIN SODIUM 40 MG/0.4ML IJ SOSY
40.0000 mg | PREFILLED_SYRINGE | INTRAMUSCULAR | Status: DC
  Administered 2022-06-28 – 2022-07-08 (×11): 40 mg via SUBCUTANEOUS
  Filled 2022-06-27 (×11): qty 0.4

## 2022-06-27 MED ORDER — IOHEXOL 9 MG/ML PO SOLN
500.0000 mL | ORAL | Status: AC
  Administered 2022-06-27 (×2): 500 mL via ORAL

## 2022-06-27 NOTE — Procedures (Addendum)
Patient Name: Kyle [name]  MRN: [id number]  Epilepsy Attending: Charlsie Quest  Referring Physician/Provider: Gordy Councilman, MD  Duration: 06/26/2022 1148 to 06/27/2022 1148    Patient history: 67yo M with seizure like activity. EEG to evaluate for seizure.    Level of alertness: Awake, asleep   AEDs during EEG study: LEV, TPM, LCM, Clonazepam, Perampanel   Technical aspects: This EEG study was done with scalp electrodes positioned according to the 10-20 International system of electrode placement. Electrical activity was reviewed with band pass filter of 1-70Hz , sensitivity of 7 uV/mm, display speed of 45mm/sec with a 60Hz  notched filter applied as appropriate. EEG data were recorded continuously and digitally stored.  Video monitoring was available and reviewed as appropriate.   Description: The posterior dominant rhythm consists of 9-10 Hz activity of moderate voltage (25-35 uV) seen predominantly in posterior head regions, symmetric and reactive to eye opening and eye closing. Sleep was characterized by vertex waves, sleep spindles (12 to 14 Hz), maximal frontocentral region.     Seizures without clinical signs were noted arising from left posterior quadrant.  At the onset, EEG showed low amplitude 15 to 16 Hz beta activity in left posterior quadrant which then evolved into 9 to 10 Hz alpha activity followed by involving all of left hemisphere and evolving into 3 to 5 Hz theta-delta slowing.  Average 1 seizure was noted every hour, lasting about 2 to 3 minutes each.    Hyperventilation and photic stimulation were not performed.      ABNORMALITY -Seizure without clinical signs, left posterior quadrant    IMPRESSION: This study showed seizures without clinical signs arising from left posterior quadrant, average 1 seizures per hour, lasting about 2 to 3 minutes each.    [name]

## 2022-06-27 NOTE — Progress Notes (Signed)
EEG maint complete.  ?

## 2022-06-27 NOTE — Progress Notes (Signed)
[facility] PROGRESS NOTE   Kyle [name] ZOX:[id number] DOB: 06-04-1955 DOA: 06/20/2022  PCP: No primary care provider on file.  Brief History/Interval Summary: 67 year old married male, independent, medical history significant for type II DM, HTN, HLD, PAD, recently seen in the ED on 05/19/2022 for complaints of visual disturbance x 2 days, unremarkable neurological exam/CT head/MRI brain, seen by neurology who recommended outpatient follow-up, also had episode of SVT with plans for outpatient cardiology follow-up, presented again to the ED on 06/20/2022 with complaints of palpitations, lightheadedness, dizziness, presyncopal.  He was found to be in SVT with heart rate of 177 which self terminated.  In the ED, also noted to have seizure-like activity for which neurology consulted.    Consultants: Neurology.  Cardiology  Procedures: Continuous EEG    Subjective/Interval History: Patient denies any complaints.  Able to speak better today compared to yesterday.  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  Apparently ongoing since April.  PCP started Topamax 4/24 without improvement.   MRI brain from 4/3 showed small curvilinear focus of susceptibility artifact involving the left occipital lobe likely a small focus of chronic hemosiderin staining.  Neurology was consulted.  Patient placed on continuous EEG Neurology is following and managing.   EEG continues to show seizure activity but seems to be less frequent compared to last week.  Expressive aphasia appears to have improved. Currently patient is on the following medications: Clonazepam, lacosamide, Keppra, Topamax, perampanel. Patient/spouse have been instructed that he should not drive x 6 months.  Per spouse, he has not been driving since his ED visit on 05/19/2022.   Paroxysmal SVT/AVNRT Admission EKG showed SVT at 170.     TSH normal at 2.14.  Echocardiogram shows  LVEF of 55 to 60%.  No regional wall motion abnormalities.  Grade 1 diastolic dysfunction was noted.  RV systolic function was normal.  No significant valvular abnormalities. Patient was seen by cardiology.  Patient was started on metoprolol.   On the morning of 5/10 patient again noted to be tachycardic.  EKG initially suggested atrial flutter.  Patient seen by cardiology.  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  Patient converted to sinus rhythm after he was given his a.m. dose of metoprolol. Patient started on oral amiodarone.  No plans for anticoagulation noted. Maintaining sinus rhythm. Supplement potassium.  Magnesium was 2.1 yesterday.   Acute kidney injury: Creatinine in Care Everywhere 0.95 in January.  Presented with creatinine of 1.62.  Lisinopril held.   Patient was given IV fluids.  Improvement in renal function noted.   Consider resuming lisinopril and Aldactone at time of discharge.   Poorly controlled type II DM with hyperglycemia: A1c 11.6.  Home metformin and glipizide on hold.  Will likely need insulin at discharge.  Currently on glargine as well as SSI.   CBGs are reasonably well-controlled.     Essential hypertension/hyperaldosteronism/hypokalemia Home hydralazine, lisinopril and spironolactone on hold.   Currently on metoprolol.   Hyperlipidemia/hypertriglyceridemia Continue gemfibrozil.  Statin intolerant.  Per cardiology, recommend lipid clinic evaluation and they will arrange follow-up   PAD: Continue aspirin and gemfibrozil.   Normocytic anemia: Likely dilutional drop in hemoglobin which is stable.  No evidence of overt bleeding.     DVT Prophylaxis: Reinitiate Lovenox Code Status: Full code Family Communication: No family at bedside.  Wife being updated on an every other day basis. Disposition Plan: PT and OT evaluation  Status is: Inpatient Remains inpatient appropriate because:  Seizures      Medications: Scheduled:  amiodarone  200 mg Oral BID    [START ON 07/09/2022] amiodarone  200 mg Oral Daily   aspirin EC  81 mg Oral Daily   clonazePAM  0.5 mg Oral TID   enoxaparin (LOVENOX) injection  40 mg Subcutaneous Q24H   gemfibrozil  600 mg Oral BID AC   insulin aspart  0-15 Units Subcutaneous TID WC   insulin aspart  0-5 Units Subcutaneous QHS   insulin aspart  4 Units Subcutaneous TID WC   insulin glargine-yfgn  15 Units Subcutaneous Daily   lacosamide  150 mg Oral BID   levETIRAcetam  2,000 mg Oral BID   metoprolol succinate  50 mg Oral Daily   perampanel  2 mg Oral QHS   topiramate  150 mg Oral BID   Continuous:  [id number] **OR** acetaminophen, senna-docusate  Antibiotics: Anti-infectives (From admission, onward)    None       Objective:  Vital Signs  Vitals:   06/26/22 1635 06/26/22 2033 06/27/22 0432 06/27/22 0814  BP:  126/77 (!) 145/78   Pulse: 81 81 64   Resp:  16 11   Temp:  98.1 F (36.7 C) 97.8 F (36.6 C) 97.8 F (36.6 C)  TempSrc:  Oral Oral Oral  SpO2:  98% 97%   Weight:      Height:        Intake/Output Summary (Last 24 hours) at 06/27/2022 1029 Last data filed at 06/27/2022 0830 Gross per 24 hour  Intake 240 ml  Output 0 ml  Net 240 ml    Filed Weights   06/20/22 2210 06/21/22 0320  Weight: 79.4 kg 79.3 kg    General appearance: Awake alert.  In no distress Resp: Clear to auscultation bilaterally.  Normal effort Cardio: S1-S2 is normal regular.  No S3-S4.  No rubs murmurs or bruit GI: Abdomen is soft.  Nontender nondistended.  Bowel sounds are present normal.  No masses organomegaly Extremities: No edema.  Full range of motion of lower extremities.    Lab Results:  Data Reviewed: I have personally reviewed following labs and reports of the imaging studies  CBC: Recent Labs  Lab 06/20/22 2322 06/21/22 0328 06/23/22 0153 06/26/22 0158 06/27/22 0234  WBC 9.1 9.9 9.1 10.8* 9.3  NEUTROABS 5.4 5.5  --   --   --   HGB 10.4* 10.3* 10.9* 11.4* 11.4*  HCT 30.6* 30.5*  31.0* 33.9* 33.1*  MCV 90.5 91.3 88.6 89.9 90.2  PLT 263 257 266 283 282     Basic Metabolic Panel: Recent Labs  Lab 06/20/22 2322 06/21/22 0328 06/22/22 0710 06/22/22 1025 06/24/22 0355 06/25/22 0222 06/26/22 0158 06/27/22 0234  NA 132* 135   < > 138 137 138 138 139  K 4.6 3.9   < > 3.8 3.4* 3.6 3.6 3.5  CL 105 108   < > 109 107 108 111 108  CO2 16* 18*   < > 19* 20* 19* 17* 19*  GLUCOSE 391* 321*   < > 368* 201* 215* 218* 187*  BUN 42* 39*   < > 25* 32* 33* 28* 32*  CREATININE 1.62* 1.31*   < > 0.96 1.01 0.99 0.88 1.00  CALCIUM 9.3 9.3   < > 9.6 9.4 9.8 9.2 9.5  MG 2.1 2.1  --   --  2.0  --  2.1  --    < > = values in this interval not displayed.  GFR: Estimated Creatinine Clearance: 73 mL/min (by C-G formula based on SCr of 1 mg/dL).   CBG: Recent Labs  Lab 06/26/22 0808 06/26/22 1145 06/26/22 1617 06/26/22 2228 06/27/22 0813  GLUCAP 326* 290* 182* 218* 235*     Radiology Studies: No results found.     LOS: 6 days   Treyvonne Tata [employer] on [email or web address]  06/27/2022, 10:29 AM

## 2022-06-27 NOTE — Progress Notes (Signed)
Rounding Note    Patient Name: Kyle [name] Date of Encounter: 06/27/2022  [facility] Cardiologist: Chilton Si, MD   Subjective   On EEG, fatigued again today  Inpatient Medications    Scheduled Meds:  amiodarone  200 mg Oral BID   [START ON 07/09/2022] amiodarone  200 mg Oral Daily   aspirin EC  81 mg Oral Daily   clonazePAM  0.5 mg Oral TID   [START ON 06/28/2022] enoxaparin (LOVENOX) injection  40 mg Subcutaneous Q24H   gemfibrozil  600 mg Oral BID AC   insulin aspart  0-15 Units Subcutaneous TID WC   insulin aspart  0-5 Units Subcutaneous QHS   insulin aspart  4 Units Subcutaneous TID WC   insulin glargine-yfgn  15 Units Subcutaneous Daily   lacosamide  150 mg Oral BID   levETIRAcetam  2,000 mg Oral BID   metoprolol succinate  50 mg Oral Daily   perampanel  2 mg Oral QHS   potassium chloride  40 mEq Oral Once   topiramate  150 mg Oral BID   Continuous Infusions:  PRN Meds: acetaminophen **OR** acetaminophen, senna-docusate   Vital Signs    Vitals:   06/26/22 1635 06/26/22 2033 06/27/22 0432 06/27/22 0814  BP:  126/77 (!) 145/78 (!) 144/72  Pulse: 81 81 64 66  Resp:  16 11 15   Temp:  98.1 F (36.7 C) 97.8 F (36.6 C) 97.8 F (36.6 C)  TempSrc:  Oral Oral Oral  SpO2:  98% 97% 96%  Weight:      Height:        Intake/Output Summary (Last 24 hours) at 06/27/2022 1131 Last data filed at 06/27/2022 0830 Gross per 24 hour  Intake 240 ml  Output 0 ml  Net 240 ml      06/21/2022    3:20 AM 06/20/2022   10:10 PM 05/19/2022    2:46 PM  Last 3 Weights  Weight (lbs) 174 lb 13.2 oz 175 lb 175 lb  Weight (kg) 79.3 kg 79.379 kg 79.379 kg      Telemetry    Sinus rhythm  - Personally Reviewed  ECG    NA - Personally Reviewed  Physical Exam   GEN: No acute distress.   Neck: No JVD Cardiac: regularly and rapid, no murmur noted. Respiratory: Clear to auscultation bilaterally. GI:  non-distended  MS: No edema; No deformity. Neuro:   Nonfocal  Psych: Normal affect   Labs    High Sensitivity Troponin:  No results for input(s): "TROPONINIHS" in the last 720 hours.   Chemistry Recent Labs  Lab 06/21/22 0328 06/22/22 0710 06/24/22 0355 06/25/22 0222 06/26/22 0158 06/27/22 0234  NA 135   < > 137 138 138 139  K 3.9   < > 3.4* 3.6 3.6 3.5  CL 108   < > 107 108 111 108  CO2 18*   < > 20* 19* 17* 19*  GLUCOSE 321*   < > 201* 215* 218* 187*  BUN 39*   < > 32* 33* 28* 32*  CREATININE 1.31*   < > 1.01 0.99 0.88 1.00  CALCIUM 9.3   < > 9.4 9.8 9.2 9.5  MG 2.1  --  2.0  --  2.1  --   GFRNONAA 59*   < > >60 >60 >60 >60  ANIONGAP 9   < > 10 11 10 12    < > = values in this interval not displayed.    Lipids  Recent Labs  Lab 06/21/22 1340  CHOL 235*  TRIG 454*  HDL 28*  LDLCALC UNABLE TO CALCULATE IF TRIGLYCERIDE OVER 400 mg/dL  CHOLHDL 8.4    Hematology Recent Labs  Lab 06/23/22 0153 06/26/22 0158 06/27/22 0234  WBC 9.1 10.8* 9.3  RBC 3.50* 3.77* 3.67*  HGB 10.9* 11.4* 11.4*  HCT 31.0* 33.9* 33.1*  MCV 88.6 89.9 90.2  MCH 31.1 30.2 31.1  MCHC 35.2 33.6 34.4  RDW 13.3 14.0 14.0  PLT 266 283 282   Thyroid  Recent Labs  Lab 06/21/22 0328  TSH 2.143    BNPNo results for input(s): "BNP", "PROBNP" in the last 168 hours.  DDimer No results for input(s): "DDIMER" in the last 168 hours.   Radiology    No results found.  Cardiac Studies   06/22/22 TTE  IMPRESSIONS     1. Left ventricular ejection fraction, by estimation, is 55 to 60%. Left  ventricular ejection fraction by 2D MOD biplane is 56.7 %. The left  ventricle has normal function. The left ventricle has no regional wall  motion abnormalities. Left ventricular  diastolic parameters are consistent with Grade I diastolic dysfunction  (impaired relaxation).   2. Right ventricular systolic function is normal. The right ventricular  size is normal. There is normal pulmonary artery systolic pressure. The  estimated right ventricular  systolic pressure is 19.0 mmHg.   3. The mitral valve is grossly normal. Trivial mitral valve  regurgitation. No evidence of mitral stenosis.   4. The aortic valve is tricuspid. Aortic valve regurgitation is not  visualized. No aortic stenosis is present.   5. The inferior vena cava is normal in size with greater than 50%  respiratory variability, suggesting right atrial pressure of 3 mmHg.   FINDINGS   Left Ventricle: Left ventricular ejection fraction, by estimation, is 55  to 60%. Left ventricular ejection fraction by 2D MOD biplane is 56.7 %.  The left ventricle has normal function. The left ventricle has no regional  wall motion abnormalities. The  left ventricular internal cavity size was normal in size. There is no left  ventricular hypertrophy. Left ventricular diastolic parameters are  consistent with Grade I diastolic dysfunction (impaired relaxation).   Right Ventricle: The right ventricular size is normal. No increase in  right ventricular wall thickness. Right ventricular systolic function is  normal. There is normal pulmonary artery systolic pressure. The tricuspid  regurgitant velocity is 2.00 m/s, and   with an assumed right atrial pressure of 3 mmHg, the estimated right  ventricular systolic pressure is 19.0 mmHg.   Left Atrium: Left atrial size was normal in size.   Right Atrium: Right atrial size was normal in size.   Pericardium: There is no evidence of pericardial effusion.   Mitral Valve: The mitral valve is grossly normal. Trivial mitral valve  regurgitation. No evidence of mitral valve stenosis.   Tricuspid Valve: The tricuspid valve is grossly normal. Tricuspid valve  regurgitation is trivial. No evidence of tricuspid stenosis.   Aortic Valve: The aortic valve is tricuspid. Aortic valve regurgitation is  not visualized. No aortic stenosis is present.   Pulmonic Valve: The pulmonic valve was grossly normal. Pulmonic valve  regurgitation is not  visualized. No evidence of pulmonic stenosis.   Aorta: The aortic root is normal in size and structure.   Venous: The inferior vena cava is normal in size with greater than 50%  respiratory variability, suggesting right atrial pressure of 3 mmHg.   IAS/Shunts: The atrial septum is  grossly normal.    Patient Profile     67 y.o. male with PAD (normal resting ABIs with abnormal exercise ABIs followed at outside facility), poorly controlled DM, HTN, HLD, migraines, recently diagnosed SVT during ER visit 04/2022 (with longstanding hx of palpitations) has recently been having intermittent visual changes and altered mention. Also presented with palpitations so cardiology asked to see for SVT. Neurology has evaluated and is concerned for focal seizures, EEG pending. Recent MRI 05/19/22 was reported to be unrevealing but addended 06/21/22 showing abnormality of small focus of chronic hemosiderin staining.   Assessment & Plan    Visual changes/AMS Seizure activity  Patient admitted with visual changes and AMS. 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 Patient remains on EEG with neurology following, last seizure activity noted on the morning of 06/24/22.  Per IM, neurology Undergoing EEG    SVT C/f AVNRT Patient was admitted in SVT with rates in the 170s. He reports intermittent episodes of this since he was much younger. In general it has been well-controlled. However he had an episode last month that lasted longer than usual. He had another long episode in the last 24 hours. He now converted back to sinus rhythm. He was hypotensive when in SVT with rates into the 170s. Stated on IV amiodarone   In sinus rhythm Continue oral amiodarone load changed metoprolol tartrate 37.5mg  BID to metop 50 mg XL     PAD, HLD  Trig 454, LDL 127 - on gemfibrozil PTA with h/o statin intolerance  continue OP follow-up to consider PCSK9i / statin alternatives. Lipid clinic referral in place   DM  Poorly controlled, A1C 11.6 - per medicine team. Avoid SGLT2 until this comes down    Will follow peripherally, if no changes will plan to sign off. He has a FU appointment with cardiology.  For questions or updates, please contact [facility] Please consult [email or web address] for contact info under        Signed, Maisie Fus, MD  06/27/2022, 11:31 AM

## 2022-06-27 NOTE — Progress Notes (Signed)
Subjective/Interval:  Mildly confused, no acute complaints  EEG continues to show 1 seizure / hr No family at bedside today  ROS: Limited by confusion Examination  Vital signs in last 24 hours: Temp:  [97.7 F (36.5 C)-98.1 F (36.7 C)] 97.8 F (36.6 C) (05/12 0814) Pulse Rate:  [64-81] 66 (05/12 0814) Resp:  [11-16] 15 (05/12 0814) BP: (126-145)/(72-78) 144/72 (05/12 0814) SpO2:  [90 %-98 %] 96 % (05/12 0814)  General: lying in bed, NAD Neuro: MS: Awake, alert, sitting at the side of the bed, follows simple commands, only able to recall 1 out of 3 planned for further studies when I discussed these with him repeatedly.  Oriented to age, May 10 but unable to answer day of the week,  CN: pupils equal and reactive,  VFF but at times reports wrong numbers (inconsistent), EOMI, face symmetric, tongue midline, normal sensation over face, Motor: No pronator drift.  Coordination: normal finger to nose and heel to shin Gait: not tested  Basic Metabolic Panel: Recent Labs  Lab 06/20/22 2322 06/21/22 0328 06/22/22 0710 06/22/22 1025 06/24/22 0355 06/25/22 0222 06/26/22 0158 06/27/22 0234  NA 132* 135   < > 138 137 138 138 139  K 4.6 3.9   < > 3.8 3.4* 3.6 3.6 3.5  CL 105 108   < > 109 107 108 111 108  CO2 16* 18*   < > 19* 20* 19* 17* 19*  GLUCOSE 391* 321*   < > 368* 201* 215* 218* 187*  BUN 42* 39*   < > 25* 32* 33* 28* 32*  CREATININE 1.62* 1.31*   < > 0.96 1.01 0.99 0.88 1.00  CALCIUM 9.3 9.3   < > 9.6 9.4 9.8 9.2 9.5  MG 2.1 2.1  --   --  2.0  --  2.1  --    < > = values in this interval not displayed.     CBC: Recent Labs  Lab 06/20/22 2322 06/21/22 0328 06/23/22 0153 06/26/22 0158 06/27/22 0234  WBC 9.1 9.9 9.1 10.8* 9.3  NEUTROABS 5.4 5.5  --   --   --   HGB 10.4* 10.3* 10.9* 11.4* 11.4*  HCT 30.6* 30.5* 31.0* 33.9* 33.1*  MCV 90.5 91.3 88.6 89.9 90.2  PLT 263 257 266 283 282      Coagulation Studies: No results for input(s): "LABPROT", "INR" in the  last 72 hours.  Imaging No new brain imaging overnight   ASSESSMENT AND PLAN: 67 yo M with 2 days of headache and right-sided vision changes, with blood sugars running in the 300s to 400s. EEG showed seizures arising from left posterior quadrant.   Seizure frequency has been very challenging to control.  Therefore patient merits additional investigation; consideration of autoimmune/paraneoplastic encephalitis.  Unfortunately because he received Lovenox already this morning, will not be able to complete lumbar puncture today.  However will proceed with CT chest abdomen pelvis with contrast for malignancy screening.  I will also obtain additional labs as below.   Overall he does seem to be tolerating the high doses of antiseizure medications well, but these may be contributing to his memory difficulty at this time (though some of this may also be due to his frequent subclinical seizures).    New onset focal seizures  - Likely lowered seizure threshold due to hyperglycemia, but consider autoimmune/paraneoplastic process given difficulty to control these despite multiple antiseizure medications.    Recommendations - CT chest abdomen pelvis for malignancy screening - Serum paraneoplastic  panel, B12, B1, Ammonia, HIV, RPR, ESR, CRP, ANA, antiphospholipid Ab, ENA, Anti-Jo, thryoid antibodies ordered  - Lovenox retimed for 5/13 evening to allow LP attempt 5/12  - CSF analysis to include cell count and differential, protein, glucose, CSF/serum glucose ratio, albumin quotient, IgG index and synthesis rate, oligoclonal bands, meningitis/encephalitis PCR panel, and CSF autoimmune encephalopathy/encephalitis panel, bacterial culture / smear and fungal culture / smear - s/p Load of perampanel 8mg  5/10, now on 2mg  qhs - Continue Klonopin 0.5 mgTID and topiramate 200mg  BID - Continue Keppra 2000mg  BID - Increase Vimpat to 200 mg twice 5/13 evening; 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 Rito Ehrlich via secure chat; no family at bedside today    Brooke Dare MD-PhD [facility] [phone] Available 7 AM to 7 PM, outside these hours please contact Neurologist on call listed on AMION

## 2022-06-28 ENCOUNTER — Inpatient Hospital Stay (HOSPITAL_COMMUNITY): Payer: Medicare Other

## 2022-06-28 DIAGNOSIS — D649 Anemia, unspecified: Secondary | ICD-10-CM | POA: Diagnosis not present

## 2022-06-28 DIAGNOSIS — I471 Supraventricular tachycardia, unspecified: Secondary | ICD-10-CM | POA: Diagnosis not present

## 2022-06-28 DIAGNOSIS — R569 Unspecified convulsions: Secondary | ICD-10-CM | POA: Diagnosis not present

## 2022-06-28 DIAGNOSIS — Z789 Other specified health status: Secondary | ICD-10-CM

## 2022-06-28 DIAGNOSIS — I4719 Other supraventricular tachycardia: Secondary | ICD-10-CM | POA: Diagnosis not present

## 2022-06-28 DIAGNOSIS — E782 Mixed hyperlipidemia: Secondary | ICD-10-CM | POA: Diagnosis not present

## 2022-06-28 LAB — CSF CELL COUNT WITH DIFFERENTIAL
RBC Count, CSF: 6000 /mm3 — ABNORMAL HIGH
Tube #: 1
WBC, CSF: 6 /mm3 — ABNORMAL HIGH (ref 0–5)

## 2022-06-28 LAB — CBC
HCT: 32.7 % — ABNORMAL LOW (ref 39.0–52.0)
Hemoglobin: 11.1 g/dL — ABNORMAL LOW (ref 13.0–17.0)
MCH: 30.2 pg (ref 26.0–34.0)
MCHC: 33.9 g/dL (ref 30.0–36.0)
MCV: 89.1 fL (ref 80.0–100.0)
Platelets: 301 10*3/uL (ref 150–400)
RBC: 3.67 MIL/uL — ABNORMAL LOW (ref 4.22–5.81)
RDW: 14.2 % (ref 11.5–15.5)
WBC: 8.9 10*3/uL (ref 4.0–10.5)
nRBC: 0 % (ref 0.0–0.2)

## 2022-06-28 LAB — CSF CULTURE W GRAM STAIN: Gram Stain: NONE SEEN

## 2022-06-28 LAB — BASIC METABOLIC PANEL
Anion gap: 6 (ref 5–15)
BUN: 29 mg/dL — ABNORMAL HIGH (ref 8–23)
CO2: 18 mmol/L — ABNORMAL LOW (ref 22–32)
Calcium: 9.1 mg/dL (ref 8.9–10.3)
Chloride: 110 mmol/L (ref 98–111)
Creatinine, Ser: 1.05 mg/dL (ref 0.61–1.24)
GFR, Estimated: >60 mL/min (ref >60)
Glucose, Bld: 221 mg/dL — ABNORMAL HIGH (ref 70–99)
Potassium: 3.5 mmol/L (ref 3.5–5.1)
Sodium: 134 mmol/L — ABNORMAL LOW (ref 135–145)

## 2022-06-28 LAB — GLUCOSE, CAPILLARY
Glucose-Capillary: 196 mg/dL — ABNORMAL HIGH (ref 70–99)
Glucose-Capillary: 301 mg/dL — ABNORMAL HIGH (ref 70–99)
Glucose-Capillary: 203 mg/dL — ABNORMAL HIGH (ref 70–99)
Glucose-Capillary: 130 mg/dL — ABNORMAL HIGH (ref 70–99)

## 2022-06-28 LAB — PROTEIN AND GLUCOSE, CSF
Glucose, CSF: 169 mg/dL — ABNORMAL HIGH (ref 40–70)
Total  Protein, CSF: 320 mg/dL — ABNORMAL HIGH (ref 15–45)

## 2022-06-28 LAB — RPR: RPR Ser Ql: NONREACTIVE

## 2022-06-28 MED ORDER — PERAMPANEL 2 MG PO TABS
4.0000 mg | ORAL_TABLET | Freq: Every day | ORAL | Status: DC
  Administered 2022-06-28 – 2022-07-08 (×11): 4 mg via ORAL
  Filled 2022-06-28 (×11): qty 2

## 2022-06-28 MED ORDER — TOPIRAMATE 100 MG PO TABS
200.0000 mg | ORAL_TABLET | Freq: Two times a day (BID) | ORAL | Status: DC
  Administered 2022-06-28 – 2022-07-05 (×14): 200 mg via ORAL
  Filled 2022-06-28 (×16): qty 2

## 2022-06-28 MED ORDER — POTASSIUM CHLORIDE CRYS ER 20 MEQ PO TBCR
40.0000 meq | EXTENDED_RELEASE_TABLET | Freq: Two times a day (BID) | ORAL | Status: AC
  Administered 2022-06-28 (×2): 40 meq via ORAL
  Filled 2022-06-28 (×2): qty 2

## 2022-06-28 MED ORDER — LIDOCAINE HCL (PF) 1 % IJ SOLN
5.0000 mL | Freq: Once | INTRAMUSCULAR | Status: AC
  Administered 2022-06-28: 3 mL
  Filled 2022-06-28: qty 5

## 2022-06-28 MED ORDER — POLYETHYLENE GLYCOL 3350 17 G PO PACK
17.0000 g | PACK | Freq: Two times a day (BID) | ORAL | Status: DC
  Filled 2022-06-28: qty 1

## 2022-06-28 MED ORDER — SENNOSIDES-DOCUSATE SODIUM 8.6-50 MG PO TABS
2.0000 | ORAL_TABLET | Freq: Two times a day (BID) | ORAL | Status: DC
  Administered 2022-06-28 – 2022-07-08 (×11): 2 via ORAL
  Filled 2022-06-28 (×15): qty 2

## 2022-06-28 MED ORDER — FLEET ENEMA 7-19 GM/118ML RE ENEM
1.0000 | ENEMA | Freq: Once | RECTAL | Status: AC
  Administered 2022-06-28: 1 via RECTAL
  Filled 2022-06-28: qty 1

## 2022-06-28 MED ORDER — LACTULOSE 10 GM/15ML PO SOLN
20.0000 g | Freq: Three times a day (TID) | ORAL | Status: DC
  Administered 2022-06-28 – 2022-07-03 (×10): 20 g via ORAL
  Filled 2022-06-28 (×13): qty 30

## 2022-06-28 NOTE — Progress Notes (Signed)
Subjective: No clinical seizures but getting more sleepy, perseverating.   ROS: Unable to obtain due to poor mental status  Examination  Vital signs in last 24 hours: Temp:  [97.6 F (36.4 C)-98.4 F (36.9 C)] 98.4 F (36.9 C) (05/12 2032) Pulse Rate:  [64-83] 64 (05/13 0415) Resp:  [11-16] 11 (05/13 0415) BP: (134-155)/(76-83) 148/80 (05/13 0415) SpO2:  [96 %-98 %] 96 % (05/13 0415)  General: sitting in bed, eating lunch, NAD Neuro: MS: Alert, oriented to self and place,  follows commands but needs repetition CN: pupils equal and reactive,  EOMI, face symmetric, tongue midline, normal sensation over face Motor: 4/5 strength in all 4 extremities  Basic Metabolic Panel: Recent Labs  Lab 06/24/22 0355 06/25/22 0222 06/26/22 0158 06/27/22 0234 06/28/22 0254  NA 137 138 138 139 134*  K 3.4* 3.6 3.6 3.5 3.5  CL 107 108 111 108 110  CO2 20* 19* 17* 19* 18*  GLUCOSE 201* 215* 218* 187* 221*  BUN 32* 33* 28* 32* 29*  CREATININE 1.01 0.99 0.88 1.00 1.05  CALCIUM 9.4 9.8 9.2 9.5 9.1  MG 2.0  --  2.1  --   --     CBC: Recent Labs  Lab 06/23/22 0153 06/26/22 0158 06/27/22 0234 06/28/22 0254  WBC 9.1 10.8* 9.3 8.9  HGB 10.9* 11.4* 11.4* 11.1*  HCT 31.0* 33.9* 33.1* 32.7*  MCV 88.6 89.9 90.2 89.1  PLT 266 283 282 301    Coagulation Studies: No results for input(s): "LABPROT", "INR" in the last 72 hours.  Imaging CT C/A/P: Heterogeneously enhancing mass in the right kidney worrisome for renal cell carcinoma. Indeterminate hypodense lesion in the left kidney. Recommend further evaluation with renal CT or MRI. Mildly enlarged right hilar lymph node. No other evidence for lymphadenopathy in the chest, abdomen or pelvis. Cholelithiasis.Large amount of stool distends the rectum with presacral edema. Trace free fluid in the left lower quadrant. Aortic Atherosclerosis (ICD10-I70.0).  ASSESSMENT AND PLAN:  67 yo M with 2 days of headache and right-sided vision changes, with  blood sugars running in the 300s to 400s. EEG showed seizures arising from left posterior quadrant.    New onset seizure - Likely lowered seizure threshold due to hyperglycemia.    Recommendations - Increase Topiramate to 200mg  BID, increase Perampanel to 4mg  QHS - Continue Vimpat 200 mg once, Keppra 2000mg  BID, Clonazepam 0.5mg  TID - If seizures persist, can add gabapentin. Of note, patient is getting more drowsy so need to be careful not to make him too sedated - Will also consider IV solumedrol due to suspected paraneoplastic etiology. Will have to check with hospitalist as his Blood glucose continues to be elevated - continuee eeg while we are adjusting anti seizure meds - continue seizure precautions - PRN IV versed for clinical seizure -Discussed plan with Dr. Rito Ehrlich via secure chat   I have spent a total of  40 minutes with the patient reviewing hospital notes,  test results, labs and examining the patient as well as establishing an assessment and plan that was discussed personally with the patient.  > 50% of time was spent in direct patient care.     Lindie Spruce Epilepsy [facility] For questions after 5pm please refer to AMION to reach the Neurologist on call

## 2022-06-28 NOTE — Evaluation (Signed)
Occupational Therapy Evaluation Patient Details Name: Kyle [name] MRN: [id number] DOB: [birth year] Today's Date: 06/28/2022   History of Present Illness Pt is 67 year old presented to [facility] on  06/20/22 for SVT and visual changes. Neurology consulted and felt likely focal seizures with postictal Todds vision impairment. PMH - DM, HTN, PAD,   Clinical Impression   Pt admitted for above dx, PTA patient lived with spouse and reports being independebt in bADLs/iADLs and functional ambulation. Pt reports compliance with seizure medication and denies any auras that prompt seizures. Pt currently presenting with impaired balance and expressive difficulties. Pt currently needing min guard to min A for OOB mobility. Pt would benefit from continued acute skilled OT services to address above deficits and help transition to next level of care. OT currently rec HH sercvices based on current functional capabilities but DC recs are susceptible to change pending patient medical progression with seizure activity.      Recommendations for follow up therapy are one component of a multi-disciplinary discharge planning process, led by the attending physician.  Recommendations may be updated based on patient status, additional functional criteria and insurance authorization.   Assistance Recommended at Discharge Frequent or constant Supervision/Assistance  Patient can return home with the following A little help with walking and/or transfers;A little help with bathing/dressing/bathroom;Assistance with feeding;Assistance with cooking/housework;Direct supervision/assist for medications management;Direct supervision/assist for financial management;Help with stairs or ramp for entrance;Assist for transportation    Functional Status Assessment  Patient has had a recent decline in their functional status and demonstrates the ability to make significant improvements in function in a reasonable and predictable amount of time.   Equipment Recommendations   (TBD pending pt progression)    Recommendations for Other Services       Precautions / Restrictions Precautions Precautions: Fall;Other (comment) Precaution Comments: seizures Restrictions Weight Bearing Restrictions: No      Mobility Bed Mobility Overal bed mobility: Needs Assistance Bed Mobility: Supine to Sit, Sit to Supine     Supine to sit: Supervision Sit to supine: Supervision        Transfers Overall transfer level: Needs assistance Equipment used: None Transfers: Sit to/from Stand Sit to Stand: Min guard                  Balance Overall balance assessment: Needs assistance Sitting-balance support: No upper extremity supported, Feet supported Sitting balance-Leahy Scale: Fair Sitting balance - Comments: Pt with slight R lean needing Min A to correct posture to midline Postural control: Right lateral lean   Standing balance-Leahy Scale: Fair Standing balance comment: Pt wobbly, swaying static standing balance. Fair to poor                           ADL either performed or assessed with clinical judgement   ADL Overall ADL's : Needs assistance/impaired Eating/Feeding: Supervision/ safety;Bed level   Grooming: Wash/dry face;Oral care;Standing;Min guard   Upper Body Bathing: Sitting;Min guard   Lower Body Bathing: Min guard;Sitting/lateral leans   Upper Body Dressing : Sitting;Min guard   Lower Body Dressing: Sitting/lateral leans;Min guard   Toilet Transfer: Minimal assistance;Ambulation   Toileting- Clothing Manipulation and Hygiene: Minimal assistance;Sitting/lateral lean   Tub/ Shower Transfer: Minimal assistance;Ambulation   Functional mobility during ADLs: Minimal assistance General ADL Comments: Pt ambulating in room needing min guard to min A to maintain balance     Vision Baseline Vision/History: 1 Wears glasses Depth Perception: Undershoots Additional Comments: Pt undershoots with  RUE,  despite correcting posture to midline. LUE more consistent and no deficits identified     Perception     Praxis      Pertinent Vitals/Pain Pain Assessment Pain Assessment: No/denies pain     Hand Dominance Right   Extremity/Trunk Assessment Upper Extremity Assessment Upper Extremity Assessment: Overall WFL for tasks assessed   Lower Extremity Assessment Lower Extremity Assessment: Generalized weakness   Cervical / Trunk Assessment Cervical / Trunk Assessment: Normal   Communication Communication Communication: Expressive difficulties   Cognition Arousal/Alertness: Awake/alert Behavior During Therapy: Flat affect Overall Cognitive Status: Impaired/Different from baseline Area of Impairment: Safety/judgement, Awareness, Following commands                       Following Commands: Follows one step commands with increased time, Follows one step commands inconsistently Safety/Judgement: Decreased awareness of safety, Decreased awareness of deficits Awareness: Intellectual   General Comments: Pt with expressive aphasisa, not able to fully assess oreintation     General Comments  VSS on RA, No signs of seizures but pt has difficulty with word finding.    Exercises     Shoulder Instructions      Home Living Family/patient expects to be discharged to:: Private residence Living Arrangements: Spouse/significant other Available Help at Discharge: Family Type of Home: House Home Access: Stairs to enter Secretary/administrator of Steps: several Entrance Stairs-Rails: Right Home Layout: One level     Bathroom Shower/Tub: Producer, television/film/video: Standard     Home Equipment: Cane - single point;Shower seat   Additional Comments: Pt with Expressive Aphasia, home setup gathered from PT notes      Prior Functioning/Environment Prior Level of Function : Independent/Modified Independent;Driving             Mobility Comments: No assistive  device ADLs Comments: independent in bADLs/iADLs        OT Problem List: Impaired balance (sitting and/or standing);Decreased activity tolerance      OT Treatment/Interventions: Self-care/ADL training;Energy conservation;Therapeutic exercise;DME and/or AE instruction;Manual therapy;Patient/family education;Balance training    OT Goals(Current goals can be found in the care plan section) Acute Rehab OT Goals Patient Stated Goal: to get back to driving OT Goal Formulation: With patient Time For Goal Achievement: 07/12/22 Potential to Achieve Goals: Good  OT Frequency: Min 2X/week    Co-evaluation              AM-PAC OT "6 Clicks" Daily Activity     Outcome Measure Help from another person eating meals?: A Little Help from another person taking care of personal grooming?: A Little Help from another person toileting, which includes using toliet, bedpan, or urinal?: A Little Help from another person bathing (including washing, rinsing, drying)?: A Little Help from another person to put on and taking off regular upper body clothing?: A Little Help from another person to put on and taking off regular lower body clothing?: A Little 6 Click Score: 18   End of Session Equipment Utilized During Treatment: Gait belt Nurse Communication: Mobility status  Activity Tolerance: Patient tolerated treatment well Patient left: in bed;with call bell/phone within reach;with bed alarm set  OT Visit Diagnosis: Unsteadiness on feet (R26.81)                Time: 1610-9604 OT Time Calculation (min): 23 min Charges:  OT General Charges $OT Visit: 1 Visit OT Evaluation $OT Eval Moderate Complexity: 1 Mod OT Treatments $Therapeutic Activity: 8-22 mins  06/28/2022  AB, OTR/L  Acute Rehabilitation Services  Office: [phone]   Tristan Schroeder 06/28/2022, 9:31 AM

## 2022-06-28 NOTE — Progress Notes (Signed)
Rounding Note    Patient Name: Kyle [name] Date of Encounter: 06/28/2022  [facility] Cardiologist: Chilton Si, MD   Subjective   No acute events. Comfortable in bed, EEG in place. Denies chest pain or shortness of breath.  Inpatient Medications    Scheduled Meds:  amiodarone  200 mg Oral BID   [START ON 07/09/2022] amiodarone  200 mg Oral Daily   aspirin EC  81 mg Oral Daily   clonazePAM  0.5 mg Oral TID   enoxaparin (LOVENOX) injection  40 mg Subcutaneous Q24H   gemfibrozil  600 mg Oral BID AC   insulin aspart  0-15 Units Subcutaneous TID WC   insulin aspart  0-5 Units Subcutaneous QHS   insulin aspart  4 Units Subcutaneous TID WC   insulin glargine-yfgn  18 Units Subcutaneous Daily   lacosamide  200 mg Oral BID   lactulose  20 g Oral TID   levETIRAcetam  2,000 mg Oral BID   metoprolol succinate  50 mg Oral Daily   perampanel  2 mg Oral QHS   potassium chloride  40 mEq Oral BID   senna-docusate  2 tablet Oral BID   topiramate  150 mg Oral BID   Continuous Infusions:  PRN Meds: acetaminophen **OR** acetaminophen   Vital Signs    Vitals:   06/27/22 1208 06/27/22 1600 06/27/22 2032 06/28/22 0415  BP: 120/85 (!) 155/83 134/76 (!) 148/80  Pulse: 81 75 83 64  Resp: 15 14 16 11   Temp: 97.7 F (36.5 C) 97.6 F (36.4 C) 98.4 F (36.9 C)   TempSrc: Oral Oral Oral   SpO2: 98% 98% 97% 96%  Weight:      Height:        Intake/Output Summary (Last 24 hours) at 06/28/2022 1011 Last data filed at 06/28/2022 0400 Gross per 24 hour  Intake 618 ml  Output 2300 ml  Net -1682 ml      06/21/2022    3:20 AM 06/20/2022   10:10 PM 05/19/2022    2:46 PM  Last 3 Weights  Weight (lbs) 174 lb 13.2 oz 175 lb 175 lb  Weight (kg) 79.3 kg 79.379 kg 79.379 kg      Telemetry    NSR - Personally Reviewed  ECG    No new - Personally Reviewed  Physical Exam   GEN: No acute distress.  EEG leads on. Neck: No JVD Cardiac: RRR, no murmurs, rubs, or  gallops.  Respiratory: Clear to auscultation bilaterally. GI: Soft, nontender, non-distended  MS: No edema; No deformity. Neuro:  Nonfocal  Psych: Normal affect   Labs    High Sensitivity Troponin:  No results for input(s): "TROPONINIHS" in the last 720 hours.   Chemistry Recent Labs  Lab 06/24/22 0355 06/25/22 0222 06/26/22 0158 06/27/22 0234 06/28/22 0254  NA 137   < > 138 139 134*  K 3.4*   < > 3.6 3.5 3.5  CL 107   < > 111 108 110  CO2 20*   < > 17* 19* 18*  GLUCOSE 201*   < > 218* 187* 221*  BUN 32*   < > 28* 32* 29*  CREATININE 1.01   < > 0.88 1.00 1.05  CALCIUM 9.4   < > 9.2 9.5 9.1  MG 2.0  --  2.1  --   --   GFRNONAA >60   < > >60 >60 >60  ANIONGAP 10   < > 10 12 6    < > =  values in this interval not displayed.    Lipids  Recent Labs  Lab 06/21/22 1340  CHOL 235*  TRIG 454*  HDL 28*  LDLCALC UNABLE TO CALCULATE IF TRIGLYCERIDE OVER 400 mg/dL  CHOLHDL 8.4    Hematology Recent Labs  Lab 06/26/22 0158 06/27/22 0234 06/28/22 0254  WBC 10.8* 9.3 8.9  RBC 3.77* 3.67* 3.67*  HGB 11.4* 11.4* 11.1*  HCT 33.9* 33.1* 32.7*  MCV 89.9 90.2 89.1  MCH 30.2 31.1 30.2  MCHC 33.6 34.4 33.9  RDW 14.0 14.0 14.2  PLT 283 282 301   Thyroid No results for input(s): "TSH", "FREET4" in the last 168 hours.  BNPNo results for input(s): "BNP", "PROBNP" in the last 168 hours.  DDimer No results for input(s): "DDIMER" in the last 168 hours.   Radiology    CT CHEST ABDOMEN PELVIS W CONTRAST  Result Date: 06/27/2022 CLINICAL DATA:  Occult malignancy EXAM: CT CHEST, ABDOMEN, AND PELVIS WITH CONTRAST TECHNIQUE: Multidetector CT imaging of the chest, abdomen and pelvis was performed following the standard protocol during bolus administration of intravenous contrast. RADIATION DOSE REDUCTION: This exam was performed according to the departmental dose-optimization program which includes automated exposure control, adjustment of the mA and/or kV according to patient size and/or  use of iterative reconstruction technique. CONTRAST:  75mL OMNIPAQUE IOHEXOL 350 MG/ML SOLN COMPARISON:  None Available. FINDINGS: CT CHEST FINDINGS Cardiovascular: No significant vascular findings. Normal heart size. No pericardial effusion. There are atherosclerotic calcifications of the aorta. Mediastinum/Nodes: There is a mildly enlarged right hilar lymph node measuring 1 cm short axis. No other enlarged mediastinal, hilar, or axillary lymph nodes. Thyroid gland, trachea, and esophagus demonstrate no significant findings. Lungs/Pleura: Lungs are clear. No pleural effusion or pneumothorax. Musculoskeletal: No chest wall mass or suspicious bone lesions identified. CT ABDOMEN PELVIS FINDINGS Hepatobiliary: Gallstones are present. There is no biliary ductal dilatation. The liver is within normal limits. Pancreas: Unremarkable. No pancreatic ductal dilatation or surrounding inflammatory changes. Spleen: Normal in size without focal abnormality. Adrenals/Urinary Tract: There is a heterogeneously enhancing mass in the right kidney measuring 2.0 x 2.3 cm. There is an indeterminate hypodense area measuring 43 Hounsfield units in the left kidney measuring 2.5 cm in diameter. There is no hydronephrosis or perinephric fat stranding. The adrenal glands and bladder are within normal limits. Stomach/Bowel: Stomach is within normal limits. Appendix appears normal. No evidence of bowel wall thickening, distention, or inflammatory changes. There is sigmoid colon diverticulosis. The rectum is distended with a large amount of stool. There is a large amount of stool throughout the entire colon. Vascular/Lymphatic: Aortic atherosclerosis. No enlarged abdominal or pelvic lymph nodes. Reproductive: Prostate is unremarkable. Other: Presacral edema is present. There is trace free fluid in the left lower quadrant. There is no focal abdominal wall hernia. Musculoskeletal: No focal osseous lesion. Degenerative changes affect the spine.  IMPRESSION: 1. Heterogeneously enhancing mass in the right kidney worrisome for renal cell carcinoma. 2. Indeterminate hypodense lesion in the left kidney. Recommend further evaluation with renal CT or MRI. 3. Mildly enlarged right hilar lymph node. No other evidence for lymphadenopathy in the chest, abdomen or pelvis. 4. Cholelithiasis. 5. Large amount of stool distends the rectum with presacral edema. 6. Trace free fluid in the left lower quadrant. Aortic Atherosclerosis (ICD10-I70.0). Electronically Signed   By: Darliss Cheney M.D.   On: 06/27/2022 21:13    Cardiac Studies   06/22/22 TTE   1. Left ventricular ejection fraction, by estimation, is 55 to 60%. Left  ventricular ejection fraction by 2D MOD biplane is 56.7 %. The left  ventricle has normal function. The left ventricle has no regional wall  motion abnormalities. Left ventricular  diastolic parameters are consistent with Grade I diastolic dysfunction  (impaired relaxation).   2. Right ventricular systolic function is normal. The right ventricular  size is normal. There is normal pulmonary artery systolic pressure. The  estimated right ventricular systolic pressure is 19.0 mmHg.   3. The mitral valve is grossly normal. Trivial mitral valve  regurgitation. No evidence of mitral stenosis.   4. The aortic valve is tricuspid. Aortic valve regurgitation is not  visualized. No aortic stenosis is present.   5. The inferior vena cava is normal in size with greater than 50%  respiratory variability, suggesting right atrial pressure of 3 mmHg.  Patient Profile     67 y.o. male with PAD (abnormal exercise ABIs), type II diabetes, hypertension, hyperlipidemia, pSVT. He presented with visual changes and altered mental status. Noted to have pSVT, cardiology consulted for management  Assessment & Plan    SVT -continue oral amiodarone -continue metoprolol succinate 50 mg daily.  -echo unremarkable -normal TSH  PAD HLD Statin  intolerance -pending lipid clinic evaluation as an outpatient for PCSK9i -continue aspirin -continue gemfibrozil for now  Type II diabetes -poorly controlled. Was on metformin and glipizide prior to admission, A1c 11.6 -now on insulin  Hypertension -home meds on hold, BP variable -had AKI on admission, now resolved. As BP increases, would restart lisinopril and titrate up, monitoring Cr  Altered mental status Seizures -per neurology and primary team  [facility] will sign off.   Medication Recommendations:  Complete amiodarone load (200 mg BID now, then change to 200 mg daily starting 07/09/22). Continue aspirin, gemfibrozil, metoprolol Other recommendations (labs, testing, etc):  discuss PCSK9i as outpatient Follow up as an outpatient:  Has follow up with Gillian Shields, NP on 08/09/22  For questions or updates, please contact [facility] Please consult [email or web address] for contact info under     Signed, Jodelle Red, MD  06/28/2022, 10:11 AM

## 2022-06-28 NOTE — Procedures (Signed)
LUMBAR PUNCTURE (SPINAL TAP) PROCEDURE NOTE  Indication: intactable seizure, paraneoplastic workup   Proceduralists: Dr Lindie Spruce, Elmer Picker NP  Time of procedure:  06/28/2022 , 1300   Risks of the procedure were dicussed with the patient including post-LP headache, bleeding, infection, weakness/numbness of legs(radiculopathy), death.    Consent obtained from: patient's wife as patient confused   Procedure Note The patient was prepped and draped, and using sterile technique a 20 gauge quinke spinal needle was inserted in the L4-5 space.   Unfortunately, CSF was not obtained. Will schedule for fluoro guided LP Patient tolerated the procedure well and blood loss was minimal.  Kyle [name]

## 2022-06-28 NOTE — Progress Notes (Addendum)
[facility] PROGRESS NOTE   Kyle [name] ZOX:[id number] DOB: Mar 29, 1955 DOA: 06/20/2022  PCP: No primary care provider on file.  Brief History/Interval Summary: 67 year old married male, independent, medical history significant for type II DM, HTN, HLD, PAD, recently seen in the ED on 05/19/2022 for complaints of visual disturbance x 2 days, unremarkable neurological exam/CT head/MRI brain, seen by neurology who recommended outpatient follow-up, also had episode of SVT with plans for outpatient cardiology follow-up, presented again to the ED on 06/20/2022 with complaints of palpitations, lightheadedness, dizziness, presyncopal.  He was found to be in SVT with heart rate of 177 which self terminated.  In the ED, also noted to have seizure-like activity for which neurology consulted.    Consultants: Neurology.  Cardiology  Procedures: Continuous EEG    Subjective/Interval History: Patient denies any complaints.  No overnight events reported.    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  Apparently ongoing since April.  PCP started Topamax 4/24 without improvement.   MRI brain from 4/3 showed small curvilinear focus of susceptibility artifact involving the left occipital lobe likely a small focus of chronic hemosiderin staining.  Neurology was consulted.  Patient placed on continuous EEG. Currently patient is on the following medications: Clonazepam, lacosamide, Keppra, Topamax, perampanel. EEG continues to show seizure activity.  Neurology is pursuing further workup. Elevated ammonia level noted although clinically he does not appear to have hepatic encephalopathy.  Could give him a trial of lactulose. Patient/spouse have been instructed that he should not drive x 6 months.  Per spouse, he has not been driving since his ED visit on 05/19/2022.   Paroxysmal SVT/AVNRT Admission EKG showed SVT at 170.     TSH normal at 2.14.   Echocardiogram shows LVEF of 55 to 60%.  No regional wall motion abnormalities.  Grade 1 diastolic dysfunction was noted.  RV systolic function was normal.  No significant valvular abnormalities. Patient was seen by cardiology.  Patient was started on metoprolol.   On the morning of 5/10 patient again noted to be tachycardic.  EKG initially suggested atrial flutter.  Patient seen by cardiology.  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  Patient converted to sinus rhythm after he was given his a.m. dose of metoprolol. Patient started on oral amiodarone.  No plans for anticoagulation noted. Maintaining sinus rhythm. Potassium to be supplemented.   Acute kidney injury: Creatinine in Care Everywhere 0.95 in January.  Presented with creatinine of 1.62.  Lisinopril held.   Patient was given IV fluids.  Improvement in renal function noted.   Consider resuming lisinopril and Aldactone at time of discharge.  Right kidney mass concerning for RCC/indeterminate hypodense lesion in the left kidney Findings discussed with patient.  Will also let his wife know.  This will need further management by urology in the outpatient setting.  No suspicious lymphadenopathy noted.  Constipation Significant stool burden noted on CT scan.  Will initiate laxatives.  Enema.   Poorly controlled type II DM with hyperglycemia: A1c 11.6.  Home metformin and glipizide on hold.  Will likely need insulin at discharge.  Currently on glargine as well as SSI.   CBGs are reasonably well-controlled.     Essential hypertension/hyperaldosteronism/hypokalemia Home hydralazine, lisinopril and spironolactone on hold.   Currently on metoprolol.   Hyperlipidemia/hypertriglyceridemia Continue gemfibrozil.  Statin intolerant.  Per cardiology, recommend lipid clinic evaluation and they will arrange follow-up   PAD: Continue aspirin and gemfibrozil.   Normocytic anemia: Likely  dilutional drop in hemoglobin which is stable.  No evidence  of overt bleeding.     DVT Prophylaxis: Reinitiate Lovenox Code Status: Full code Family Communication: No family at bedside.  Wife being updated on an every other day basis. Disposition Plan: PT and OT evaluation  Status is: Inpatient Remains inpatient appropriate because: Seizures      Medications: Scheduled:  amiodarone  200 mg Oral BID   [START ON 07/09/2022] amiodarone  200 mg Oral Daily   aspirin EC  81 mg Oral Daily   clonazePAM  0.5 mg Oral TID   enoxaparin (LOVENOX) injection  40 mg Subcutaneous Q24H   gemfibrozil  600 mg Oral BID AC   insulin aspart  0-15 Units Subcutaneous TID WC   insulin aspart  0-5 Units Subcutaneous QHS   insulin aspart  4 Units Subcutaneous TID WC   insulin glargine-yfgn  18 Units Subcutaneous Daily   lacosamide  200 mg Oral BID   levETIRAcetam  2,000 mg Oral BID   metoprolol succinate  50 mg Oral Daily   perampanel  2 mg Oral QHS   polyethylene glycol  17 g Oral BID   senna-docusate  2 tablet Oral BID   sodium phosphate  1 enema Rectal Once   topiramate  150 mg Oral BID   Continuous:  [id number] **OR** acetaminophen  Antibiotics: Anti-infectives (From admission, onward)    None       Objective:  Vital Signs  Vitals:   06/27/22 1208 06/27/22 1600 06/27/22 2032 06/28/22 0415  BP: 120/85 (!) 155/83 134/76 (!) 148/80  Pulse: 81 75 83 64  Resp: 15 14 16 11   Temp: 97.7 F (36.5 C) 97.6 F (36.4 C) 98.4 F (36.9 C)   TempSrc: Oral Oral Oral   SpO2: 98% 98% 97% 96%  Weight:      Height:        Intake/Output Summary (Last 24 hours) at 06/28/2022 0938 Last data filed at 06/28/2022 0400 Gross per 24 hour  Intake 618 ml  Output 2300 ml  Net -1682 ml    Filed Weights   06/20/22 2210 06/21/22 0320  Weight: 79.4 kg 79.3 kg    General appearance: Awake alert.  In no distress Resp: Clear to auscultation bilaterally.  Normal effort Cardio: S1-S2 is normal regular.  No S3-S4.  No rubs murmurs or bruit GI: Abdomen is  soft.  Nontender nondistended.  Bowel sounds are present normal.  No masses organomegaly Extremities: No edema.  Full range of motion of lower extremities.   Lab Results:  Data Reviewed: I have personally reviewed following labs and reports of the imaging studies  CBC: Recent Labs  Lab 06/23/22 0153 06/26/22 0158 06/27/22 0234 06/28/22 0254  WBC 9.1 10.8* 9.3 8.9  HGB 10.9* 11.4* 11.4* 11.1*  HCT 31.0* 33.9* 33.1* 32.7*  MCV 88.6 89.9 90.2 89.1  PLT 266 283 282 301     Basic Metabolic Panel: Recent Labs  Lab 06/24/22 0355 06/25/22 0222 06/26/22 0158 06/27/22 0234 06/28/22 0254  NA 137 138 138 139 134*  K 3.4* 3.6 3.6 3.5 3.5  CL 107 108 111 108 110  CO2 20* 19* 17* 19* 18*  GLUCOSE 201* 215* 218* 187* 221*  BUN 32* 33* 28* 32* 29*  CREATININE 1.01 0.99 0.88 1.00 1.05  CALCIUM 9.4 9.8 9.2 9.5 9.1  MG 2.0  --  2.1  --   --      GFR: Estimated Creatinine Clearance: 69.5 mL/min (by C-G formula based  on SCr of 1.05 mg/dL).   CBG: Recent Labs  Lab 06/27/22 0813 06/27/22 1206 06/27/22 1617 06/27/22 2233 06/28/22 0737  GLUCAP 235* 283* 198* 214* 196*     Radiology Studies: CT CHEST ABDOMEN PELVIS W CONTRAST  Result Date: 06/27/2022 CLINICAL DATA:  Occult malignancy EXAM: CT CHEST, ABDOMEN, AND PELVIS WITH CONTRAST TECHNIQUE: Multidetector CT imaging of the chest, abdomen and pelvis was performed following the standard protocol during bolus administration of intravenous contrast. RADIATION DOSE REDUCTION: This exam was performed according to the departmental dose-optimization program which includes automated exposure control, adjustment of the mA and/or kV according to patient size and/or use of iterative reconstruction technique. CONTRAST:  75mL OMNIPAQUE IOHEXOL 350 MG/ML SOLN COMPARISON:  None Available. FINDINGS: CT CHEST FINDINGS Cardiovascular: No significant vascular findings. Normal heart size. No pericardial effusion. There are atherosclerotic  calcifications of the aorta. Mediastinum/Nodes: There is a mildly enlarged right hilar lymph node measuring 1 cm short axis. No other enlarged mediastinal, hilar, or axillary lymph nodes. Thyroid gland, trachea, and esophagus demonstrate no significant findings. Lungs/Pleura: Lungs are clear. No pleural effusion or pneumothorax. Musculoskeletal: No chest wall mass or suspicious bone lesions identified. CT ABDOMEN PELVIS FINDINGS Hepatobiliary: Gallstones are present. There is no biliary ductal dilatation. The liver is within normal limits. Pancreas: Unremarkable. No pancreatic ductal dilatation or surrounding inflammatory changes. Spleen: Normal in size without focal abnormality. Adrenals/Urinary Tract: There is a heterogeneously enhancing mass in the right kidney measuring 2.0 x 2.3 cm. There is an indeterminate hypodense area measuring 43 Hounsfield units in the left kidney measuring 2.5 cm in diameter. There is no hydronephrosis or perinephric fat stranding. The adrenal glands and bladder are within normal limits. Stomach/Bowel: Stomach is within normal limits. Appendix appears normal. No evidence of bowel wall thickening, distention, or inflammatory changes. There is sigmoid colon diverticulosis. The rectum is distended with a large amount of stool. There is a large amount of stool throughout the entire colon. Vascular/Lymphatic: Aortic atherosclerosis. No enlarged abdominal or pelvic lymph nodes. Reproductive: Prostate is unremarkable. Other: Presacral edema is present. There is trace free fluid in the left lower quadrant. There is no focal abdominal wall hernia. Musculoskeletal: No focal osseous lesion. Degenerative changes affect the spine. IMPRESSION: 1. Heterogeneously enhancing mass in the right kidney worrisome for renal cell carcinoma. 2. Indeterminate hypodense lesion in the left kidney. Recommend further evaluation with renal CT or MRI. 3. Mildly enlarged right hilar lymph node. No other evidence for  lymphadenopathy in the chest, abdomen or pelvis. 4. Cholelithiasis. 5. Large amount of stool distends the rectum with presacral edema. 6. Trace free fluid in the left lower quadrant. Aortic Atherosclerosis (ICD10-I70.0). Electronically Signed   By: Darliss Cheney M.D.   On: 06/27/2022 21:13       LOS: 7 days   Kyle [name]  [facility] Pager on [email or web address]  06/28/2022, 9:38 AM

## 2022-06-28 NOTE — Progress Notes (Signed)
Performed maintenance on all leads.  All under 10

## 2022-06-28 NOTE — Progress Notes (Signed)
PT Cancellation Note  Patient Details Name: Kyle [name] MRN: [id number] DOB: August 24, 1955   Cancelled Treatment:    Reason Eval/Treat Not Completed: Other (comment). Pt on bedrest after lumbar puncture.    Angelina Ok [facility] 06/28/2022, 1:09 PM Skip Mayer PT Acute [employer] [phone]

## 2022-06-28 NOTE — Progress Notes (Addendum)
Received call from lab to advise Total protein from CSF is 320 and not 80. Correction has been made in the results.  Notified MD   MD placed order for STAT CT-head and MRI. MRI is unable to be done unless patient is completely removed from EEG. MD notified.

## 2022-06-28 NOTE — Procedures (Signed)
Patient Name: Kyle [name]  MRN: [id number]  Epilepsy Attending: Charlsie Quest  Referring Physician/Provider: Gordy Councilman, MD  Duration: 06/27/2022 1148 to 06/28/2022 1330   Patient history: 67yo M with seizure like activity. EEG to evaluate for seizure.    Level of alertness: Awake, asleep   AEDs during EEG study: LEV, TPM, LCM, Clonazepam, Perampanel   Technical aspects: This EEG study was done with scalp electrodes positioned according to the 10-20 International system of electrode placement. Electrical activity was reviewed with band pass filter of 1-70Hz , sensitivity of 7 uV/mm, display speed of 60mm/sec with a 60Hz  notched filter applied as appropriate. EEG data were recorded continuously and digitally stored.  Video monitoring was available and reviewed as appropriate.   Description: The posterior dominant rhythm consists of 9-10 Hz activity of moderate voltage (25-35 uV) seen predominantly in posterior head regions, symmetric and reactive to eye opening and eye closing. Sleep was characterized by vertex waves, sleep spindles (12 to 14 Hz), maximal frontocentral region.     Seizures without clinical signs were noted arising from left posterior quadrant.  At the onset, EEG showed low amplitude 15 to 16 Hz beta activity in left posterior quadrant which then evolved into 9 to 10 Hz alpha activity followed by involving all of left hemisphere and evolving into 3 to 5 Hz theta-delta slowing.  Average 0.5 seizure was noted every hour, lasting about 2 to 3 minutes each.    Hyperventilation and photic stimulation were not performed.     EEG was disconnected between 06/27/2022 1736 to 1910 for imaging.   ABNORMALITY -Seizure without clinical signs, left posterior quadrant    IMPRESSION: This study showed seizures without clinical signs arising from left posterior quadrant, average 0.5 seizures per hour, lasting about 2 to 3 minutes each.    [name]

## 2022-06-28 NOTE — Inpatient Diabetes Management (Signed)
Inpatient Diabetes Program Recommendations  AACE/ADA: New Consensus Statement on Inpatient Glycemic Control (2015)  Target Ranges:  Prepandial:   less than 140 mg/dL      Peak postprandial:   less than 180 mg/dL (1-2 hours)      Critically ill patients:  140 - 180 mg/dL   Lab Results  Component Value Date   GLUCAP 196 (H) 06/28/2022   HGBA1C 11.6 (H) 06/21/2022    Review of Glycemic Control  Latest Reference Range & Units 06/27/22 08:13 06/27/22 12:06 06/27/22 16:17 06/27/22 22:33 06/28/22 07:37  Glucose-Capillary 70 - 99 mg/dL 409 (H) 811 (H) 914 (H) 214 (H) 196 (H)  (H): Data is abnormally high Diabetes history: DM2 Outpatient Diabetes medications: Glipizide & Metformin  Current orders for Inpatient glycemic control: Semglee 18 units QD, Novolog 0-15 units TID and 0-5 units QHS, Novolog 4 units TID with meals    Inpatient Diabetes Program Recommendations:     Semglee 24 units QAM Novolog 5 units TID with meals if he consumes at least 50%   Will continue to follow while inpatient.  Thanks, Lujean Rave, MSN, RNC-OB Diabetes Coordinator [phone] (8a-5p)

## 2022-06-29 ENCOUNTER — Inpatient Hospital Stay (HOSPITAL_COMMUNITY): Payer: Medicare Other

## 2022-06-29 DIAGNOSIS — I4719 Other supraventricular tachycardia: Secondary | ICD-10-CM | POA: Diagnosis not present

## 2022-06-29 DIAGNOSIS — R569 Unspecified convulsions: Secondary | ICD-10-CM | POA: Diagnosis not present

## 2022-06-29 DIAGNOSIS — D649 Anemia, unspecified: Secondary | ICD-10-CM | POA: Diagnosis not present

## 2022-06-29 LAB — MAGNESIUM: Magnesium: 2 mg/dL (ref 1.7–2.4)

## 2022-06-29 LAB — CBC
HCT: 38.1 % — ABNORMAL LOW (ref 39.0–52.0)
Hemoglobin: 13.1 g/dL (ref 13.0–17.0)
MCH: 30.6 pg (ref 26.0–34.0)
MCHC: 34.4 g/dL (ref 30.0–36.0)
MCV: 89 fL (ref 80.0–100.0)
Platelets: 333 10*3/uL (ref 150–400)
RBC: 4.28 MIL/uL (ref 4.22–5.81)
RDW: 14.6 % (ref 11.5–15.5)
WBC: 11.6 10*3/uL — ABNORMAL HIGH (ref 4.0–10.5)
nRBC: 0 % (ref 0.0–0.2)

## 2022-06-29 LAB — GLUCOSE, CAPILLARY
Glucose-Capillary: 174 mg/dL — ABNORMAL HIGH (ref 70–99)
Glucose-Capillary: 164 mg/dL — ABNORMAL HIGH (ref 70–99)
Glucose-Capillary: 129 mg/dL — ABNORMAL HIGH (ref 70–99)
Glucose-Capillary: 118 mg/dL — ABNORMAL HIGH (ref 70–99)
Glucose-Capillary: 164 mg/dL — ABNORMAL HIGH (ref 70–99)

## 2022-06-29 LAB — EXTRACTABLE NUCLEAR ANTIGEN ANTIBODY
ENA SM Ab Ser-aCnc: <0.2 AI (ref 0.0–0.9)
Ribonucleic Protein: <0.2 AI (ref 0.0–0.9)
SSA (Ro) (ENA) Antibody, IgG: <0.2 AI (ref 0.0–0.9)
SSB (La) (ENA) Antibody, IgG: <0.2 AI (ref 0.0–0.9)
Scleroderma (Scl-70) (ENA) Antibody, IgG: <0.2 AI (ref 0.0–0.9)
ds DNA Ab: <1 IU/mL (ref 0–9)

## 2022-06-29 LAB — AMMONIA: Ammonia: 29 umol/L (ref 9–35)

## 2022-06-29 LAB — BASIC METABOLIC PANEL
Anion gap: 13 (ref 5–15)
BUN: 21 mg/dL (ref 8–23)
CO2: 18 mmol/L — ABNORMAL LOW (ref 22–32)
Calcium: 9.8 mg/dL (ref 8.9–10.3)
Chloride: 112 mmol/L — ABNORMAL HIGH (ref 98–111)
Creatinine, Ser: 0.98 mg/dL (ref 0.61–1.24)
GFR, Estimated: >60 mL/min (ref >60)
Glucose, Bld: 193 mg/dL — ABNORMAL HIGH (ref 70–99)
Potassium: 3.8 mmol/L (ref 3.5–5.1)
Sodium: 143 mmol/L (ref 135–145)

## 2022-06-29 LAB — ANA W/REFLEX IF POSITIVE: Anti Nuclear Antibody (ANA): NEGATIVE

## 2022-06-29 LAB — VDRL, CSF: VDRL Quant, CSF: NONREACTIVE

## 2022-06-29 LAB — ANTI-JO 1 ANTIBODY, IGG: Anti JO-1: <0.2 AI (ref 0.0–0.9)

## 2022-06-29 MED ORDER — GADOBUTROL 1 MMOL/ML IV SOLN
8.0000 mL | Freq: Once | INTRAVENOUS | Status: AC | PRN
  Administered 2022-06-29: 8 mL via INTRAVENOUS

## 2022-06-29 MED ORDER — SODIUM CHLORIDE 0.45 % IV SOLN: INTRAVENOUS | Status: AC

## 2022-06-29 MED ORDER — POTASSIUM CHLORIDE CRYS ER 20 MEQ PO TBCR
40.0000 meq | EXTENDED_RELEASE_TABLET | Freq: Two times a day (BID) | ORAL | Status: AC
  Administered 2022-06-29 (×2): 40 meq via ORAL
  Filled 2022-06-29: qty 2
  Filled 2022-06-29: qty 4

## 2022-06-29 NOTE — Procedures (Signed)
Patient Name: Kyle [name]  MRN: [id number]  Epilepsy Attending: Charlsie Quest  Referring Physician/Provider: Gordy Councilman, MD  Duration: 06/28/2022 1330 to 06/29/2022 1930   Patient history: 67yo M with seizure like activity. EEG to evaluate for seizure.    Level of alertness: Awake, asleep   AEDs during EEG study: LEV, TPM, LCM, Clonazepam, Perampanel   Technical aspects: This EEG study was done with scalp electrodes positioned according to the 10-20 International system of electrode placement. Electrical activity was reviewed with band pass filter of 1-70Hz , sensitivity of 7 uV/mm, display speed of 77mm/sec with a 60Hz  notched filter applied as appropriate. EEG data were recorded continuously and digitally stored.  Video monitoring was available and reviewed as appropriate.   Description: The posterior dominant rhythm consists of 9-10 Hz activity of moderate voltage (25-35 uV) seen predominantly in posterior head regions, symmetric and reactive to eye opening and eye closing. Sleep was characterized by vertex waves, sleep spindles (12 to 14 Hz), maximal frontocentral region.     Seizures without clinical sign was noted on 06/29/2022 at 0920 arising from left posterior quadrant.  At the onset, EEG showed low amplitude 15 to 16 Hz beta activity in left posterior quadrant which then evolved into 9 to 10 Hz alpha activity followed by involving all of left hemisphere and evolving into 3 to 5 Hz theta-delta slowing. Seizure lasted for about 3 minutes  Hyperventilation and photic stimulation were not performed.      EEG was disconnected between 06/28/2022 1442 to 06/29/2022 0330 and 06/29/2022 0825 to 1321 for imaging.   ABNORMALITY -Seizure without clinical signs, left posterior quadrant    IMPRESSION: This study showed one seizure without clinical signs on 06/29/2022 at 0920 arising from left posterior quadrant.    Sallyanne Birkhead Annabelle Harman

## 2022-06-29 NOTE — Progress Notes (Signed)
[facility] PROGRESS NOTE   Kyle [name] XBM:[id number] DOB: 1955-03-21 DOA: 06/20/2022  PCP: No primary care provider on file.  Brief History/Interval Summary: 67 year old married male, independent, medical history significant for type II DM, HTN, HLD, PAD, recently seen in the ED on 05/19/2022 for complaints of visual disturbance x 2 days, unremarkable neurological exam/CT head/MRI brain, seen by neurology who recommended outpatient follow-up, also had episode of SVT with plans for outpatient cardiology follow-up, presented again to the ED on 06/20/2022 with complaints of palpitations, lightheadedness, dizziness, presyncopal.  He was found to be in SVT with heart rate of 177 which self terminated.  In the ED, also noted to have seizure-like activity for which neurology consulted.    Consultants: Neurology.  Cardiology  Procedures: Continuous EEG    Subjective/Interval History: Patient underwent LP yesterday evening.  Noted to be lethargic this morning but he does answer questions.  Does not appear to be in any distress or discomfort.     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  Apparently ongoing since April.  PCP started Topamax 4/24 without improvement.   MRI brain from 4/3 showed small curvilinear focus of susceptibility artifact involving the left occipital lobe likely a small focus of chronic hemosiderin staining.  Neurology was consulted.  Patient placed on continuous EEG. Currently patient is on the following medications: Clonazepam, lacosamide, Keppra, Topamax, perampanel. EEG continues to show seizure activity.  Neurology initiated further workup.  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  See below. Patient underwent LP.  Traumatic tap was noted.  CT head did not show any acute findings.  Plan is for MRI brain today. Neurology also  considering giving high-dose steroids. Elevated ammonia level noted although clinically he does not appear to have hepatic encephalopathy.  Given a trial of lactulose.  Ammonia level noted to be normal today. Patient/spouse have been instructed that he should not drive x 6 months.  Per spouse, he has not been driving since his ED visit on 05/19/2022.   Paroxysmal SVT/AVNRT Admission EKG showed SVT at 170.     TSH normal at 2.14.  Echocardiogram shows LVEF of 55 to 60%.  No regional wall motion abnormalities.  Grade 1 diastolic dysfunction was noted.  RV systolic function was normal.  No significant valvular abnormalities. Patient was seen by cardiology.  Patient was started on metoprolol.   On the morning of 5/10 patient again noted to be tachycardic.  EKG initially suggested atrial flutter.  Patient seen by cardiology.  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  Patient converted to sinus rhythm after he was given his a.m. dose of metoprolol. Patient started on oral amiodarone.  No plans for anticoagulation noted. Maintaining sinus rhythm. Will supplement potassium.   Acute kidney injury, resolved  Creatinine in Care Everywhere 0.95 in January.  Presented with creatinine of 1.62.  Lisinopril held.   Patient was given IV fluids.  Improvement in renal function noted.   Consider resuming lisinopril and Aldactone at time of discharge.  Right kidney mass concerning for RCC/indeterminate hypodense lesion in the left kidney Findings are communicated to patient and his wife.  He will need to be seen by urology in the outpatient setting.    Constipation Significant stool burden noted on CT scan.  Continue with lactulose.  He did have a large bowel movement yesterday morning.   Poorly controlled type  II DM with hyperglycemia: A1c 11.6.  Home metformin and glipizide on hold.  Will likely need insulin at discharge.  Currently on glargine and SSI. They might be worsening in his glycemic control if he is  initiated on high-dose steroids.  Will need to adjust the dose of his insulin.   Essential hypertension/hyperaldosteronism/hypokalemia Home hydralazine, lisinopril and spironolactone on hold.   Currently on metoprolol. High blood pressure readings noted over the last 24 hours.  Continue to monitor for now.  May need dose adjustments to his metoprolol depending on trends over the next 24 hours.   Hyperlipidemia/hypertriglyceridemia Continue gemfibrozil.  Statin intolerant.  Per cardiology, recommend lipid clinic evaluation and they will arrange follow-up   PAD: Continue aspirin and gemfibrozil.   Normocytic anemia: Likely dilutional drop in hemoglobin which is stable.  No evidence of overt bleeding.     DVT Prophylaxis: Lovenox Code Status: Full code Family Communication: No family at bedside.  Wife being updated on an every other day basis. Disposition Plan: PT and OT evaluation  Status is: Inpatient Remains inpatient appropriate because: Seizures      Medications: Scheduled:  amiodarone  200 mg Oral BID   [START ON 07/09/2022] amiodarone  200 mg Oral Daily   aspirin EC  81 mg Oral Daily   clonazePAM  0.5 mg Oral TID   enoxaparin (LOVENOX) injection  40 mg Subcutaneous Q24H   gemfibrozil  600 mg Oral BID AC   insulin aspart  0-15 Units Subcutaneous TID WC   insulin aspart  0-5 Units Subcutaneous QHS   insulin aspart  4 Units Subcutaneous TID WC   insulin glargine-yfgn  18 Units Subcutaneous Daily   lacosamide  200 mg Oral BID   lactulose  20 g Oral TID   levETIRAcetam  2,000 mg Oral BID   metoprolol succinate  50 mg Oral Daily   perampanel  4 mg Oral QHS   senna-docusate  2 tablet Oral BID   topiramate  200 mg Oral BID   Continuous:  [id number] **OR** acetaminophen  Antibiotics: Anti-infectives (From admission, onward)    None       Objective:  Vital Signs  Vitals:   06/28/22 1421 06/28/22 2009 06/29/22 0329 06/29/22 0841  BP: 132/74 (!) 154/86  (!) 174/96 (!) 163/91  Pulse: 73 73 80 75  Resp: 13 12 15 16   Temp: 98.2 F (36.8 C) 98 F (36.7 C) 97.6 F (36.4 C) 97.6 F (36.4 C)  TempSrc: Axillary Oral Oral Oral  SpO2: 97% 97% 98% 98%  Weight:      Height:       No intake or output data in the 24 hours ending 06/29/22 1021  Filed Weights   06/20/22 2210 06/21/22 0320  Weight: 79.4 kg 79.3 kg    General appearance: Lethargic but easily arousable.  In no distress Resp: Clear to auscultation bilaterally.  Normal effort Cardio: S1-S2 is normal regular.  No S3-S4.  No rubs murmurs or bruit GI: Abdomen is soft.  Nontender nondistended.  Bowel sounds are present normal.  No masses organomegaly Extremities: No edema.  Moving all 4 extremities Neurologic: No obvious focal neurological deficits   Lab Results:  Data Reviewed: I have personally reviewed following labs and reports of the imaging studies  CBC: Recent Labs  Lab 06/23/22 0153 06/26/22 0158 06/27/22 0234 06/28/22 0254 06/29/22 0245  WBC 9.1 10.8* 9.3 8.9 11.6*  HGB 10.9* 11.4* 11.4* 11.1* 13.1  HCT 31.0* 33.9* 33.1* 32.7* 38.1*  MCV 88.6  89.9 90.2 89.1 89.0  PLT 266 283 282 301 333     Basic Metabolic Panel: Recent Labs  Lab 06/24/22 0355 06/25/22 0222 06/26/22 0158 06/27/22 0234 06/28/22 0254 06/29/22 0245  NA 137 138 138 139 134* 143  K 3.4* 3.6 3.6 3.5 3.5 3.8  CL 107 108 111 108 110 112*  CO2 20* 19* 17* 19* 18* 18*  GLUCOSE 201* 215* 218* 187* 221* 193*  BUN 32* 33* 28* 32* 29* 21  CREATININE 1.01 0.99 0.88 1.00 1.05 0.98  CALCIUM 9.4 9.8 9.2 9.5 9.1 9.8  MG 2.0  --  2.1  --   --  2.0     GFR: Estimated Creatinine Clearance: 74.5 mL/min (by C-G formula based on SCr of 0.98 mg/dL).   CBG: Recent Labs  Lab 06/28/22 0737 06/28/22 1208 06/28/22 1641 06/28/22 2146 06/29/22 0845  GLUCAP 196* 301* 203* 130* 174*     Radiology Studies: CT HEAD WO CONTRAST ( )  Result Date: 06/28/2022 CLINICAL DATA:  Initial evaluation for  subarachnoid hemorrhage, xanthochromia on CSF. EXAM: CT HEAD WITHOUT CONTRAST TECHNIQUE: Contiguous axial images were obtained from the base of the skull through the vertex without intravenous contrast. RADIATION DOSE REDUCTION: This exam was performed according to the departmental dose-optimization program which includes automated exposure control, adjustment of the mA and/or kV according to patient size and/or use of iterative reconstruction technique. COMPARISON:  Prior Study from 05/19/2022. FINDINGS: Brain: Cerebral volume within normal limits. Mild cerebral white matter disease, most likely related to chronic small vessel ischemic disease. No acute intracranial hemorrhage. No acute large vessel territory infarct. No mass lesion, midline shift or mass effect. No hydrocephalus or extra-axial fluid collection. Few small foci of pneumocephalus noted, suspected to be related to recent lumbar puncture. Vascular: No abnormal hyperdense vessel. Scattered vascular calcifications noted within the carotid siphons. Skull: Scalp soft tissues demonstrate no acute finding. Calvarium intact. Sinuses/Orbits: Globes and orbital soft tissues within normal limits. Scattered mucosal thickening present about the ethmoidal air cells. Mild-to-moderate mucosal thickening with pneumatized secretions about the right maxillary sinus. No mastoid effusion. Other: None. IMPRESSION: 1. No acute intracranial abnormality. 2. Few small foci of pneumocephalus, likely secondary to recent lumbar puncture. 3. Mild cerebral white matter disease, most likely related to chronic small vessel ischemic disease. 4. Right maxillary sinusitis. Electronically Signed   By: Rise Mu M.D.   On: 06/28/2022 22:37   DG FL GUIDED LUMBAR PUNCTURE  Result Date: 06/28/2022 CLINICAL DATA:  Patient with intractable seizures. Request is for lumbar puncture for further evaluation EXAM: LUMBAR PUNCTURE UNDER FLUOROSCOPY PROCEDURE: An appropriate skin entry  site was determined fluoroscopically. Operator donned sterile gloves and mask. Skin site was marked, then prepped with Betadine, draped in usual sterile fashion, and infiltrated locally with 1% lidocaine. A 18 gauge spinal needle advanced into the thecal sac at lL3- L4 from a laterality interlaminar approach. Pink tinged turning to clear colorless CSF spontaneously returned. 5.5 ml CSF were collected and divided among 4 sterile vials for the requested laboratory studies. Opening pressure could not be measured but was low. The needle was then removed. The patient tolerated the procedure well and there were no complications. FLUOROSCOPY: Radiation Exposure Index (as provided by the fluoroscopic device): 70.2 mGy Kerma IMPRESSION: Technically successful lumbar puncture under fluoroscopy. Low opening pressure, too low to be measured directly. Due to limited CSF return, only 5.5 cc could be obtained. This exam was performed by Anders Grant under the direct supervised and interpreted by  Dr. Jeronimo Greaves Electronically Signed   By: Jeronimo Greaves M.D.   On: 06/28/2022 17:00   CT CHEST ABDOMEN PELVIS W CONTRAST  Result Date: 06/27/2022 CLINICAL DATA:  Occult malignancy EXAM: CT CHEST, ABDOMEN, AND PELVIS WITH CONTRAST TECHNIQUE: Multidetector CT imaging of the chest, abdomen and pelvis was performed following the standard protocol during bolus administration of intravenous contrast. RADIATION DOSE REDUCTION: This exam was performed according to the departmental dose-optimization program which includes automated exposure control, adjustment of the mA and/or kV according to patient size and/or use of iterative reconstruction technique. CONTRAST:  75mL OMNIPAQUE IOHEXOL 350 MG/ML SOLN COMPARISON:  None Available. FINDINGS: CT CHEST FINDINGS Cardiovascular: No significant vascular findings. Normal heart size. No pericardial effusion. There are atherosclerotic calcifications of the aorta. Mediastinum/Nodes: There is a  mildly enlarged right hilar lymph node measuring 1 cm short axis. No other enlarged mediastinal, hilar, or axillary lymph nodes. Thyroid gland, trachea, and esophagus demonstrate no significant findings. Lungs/Pleura: Lungs are clear. No pleural effusion or pneumothorax. Musculoskeletal: No chest wall mass or suspicious bone lesions identified. CT ABDOMEN PELVIS FINDINGS Hepatobiliary: Gallstones are present. There is no biliary ductal dilatation. The liver is within normal limits. Pancreas: Unremarkable. No pancreatic ductal dilatation or surrounding inflammatory changes. Spleen: Normal in size without focal abnormality. Adrenals/Urinary Tract: There is a heterogeneously enhancing mass in the right kidney measuring 2.0 x 2.3 cm. There is an indeterminate hypodense area measuring 43 Hounsfield units in the left kidney measuring 2.5 cm in diameter. There is no hydronephrosis or perinephric fat stranding. The adrenal glands and bladder are within normal limits. Stomach/Bowel: Stomach is within normal limits. Appendix appears normal. No evidence of bowel wall thickening, distention, or inflammatory changes. There is sigmoid colon diverticulosis. The rectum is distended with a large amount of stool. There is a large amount of stool throughout the entire colon. Vascular/Lymphatic: Aortic atherosclerosis. No enlarged abdominal or pelvic lymph nodes. Reproductive: Prostate is unremarkable. Other: Presacral edema is present. There is trace free fluid in the left lower quadrant. There is no focal abdominal wall hernia. Musculoskeletal: No focal osseous lesion. Degenerative changes affect the spine. IMPRESSION: 1. Heterogeneously enhancing mass in the right kidney worrisome for renal cell carcinoma. 2. Indeterminate hypodense lesion in the left kidney. Recommend further evaluation with renal CT or MRI. 3. Mildly enlarged right hilar lymph node. No other evidence for lymphadenopathy in the chest, abdomen or pelvis. 4.  Cholelithiasis. 5. Large amount of stool distends the rectum with presacral edema. 6. Trace free fluid in the left lower quadrant. Aortic Atherosclerosis (ICD10-I70.0). Electronically Signed   By: Darliss Cheney M.D.   On: 06/27/2022 21:13       LOS: 8 days   Jakeisha Stricker Rito Ehrlich  [facility] Pager on [email or web address]  06/29/2022, 10:21 AM

## 2022-06-29 NOTE — Progress Notes (Signed)
PT Cancellation Note  Patient Details Name: Kyle [name] MRN: [id number] DOB: 21-Feb-1955   Cancelled Treatment:    Reason Eval/Treat Not Completed: Fatigue/lethargy limiting ability to participate. Per RN and MD pt too lethargic to participate. Will continue attempts.   Angelina Ok [facility] 06/29/2022, 4:09 PM Skip Mayer PT Acute [employer] [phone]

## 2022-06-29 NOTE — Care Plan (Signed)
Patient was disconnected from EEG leads to go to MRI, will reconnect PT when he returns.

## 2022-06-29 NOTE — Care Management (Signed)
PT was rehooked up to EEG. [facility] notified of rehook.

## 2022-06-29 NOTE — Progress Notes (Signed)
Subjective: Fibroadenoma puncture yesterday.  Very drowsy, difficult to arouse this morning.  ROS: Unable to obtain due to poor mental status  Examination  Vital signs in last 24 hours: Temp:  [97.6 F (36.4 C)-98.2 F (36.8 C)] 97.6 F (36.4 C) (05/14 0841) Pulse Rate:  [73-80] 75 (05/14 0841) Resp:  [12-16] 16 (05/14 0841) BP: (132-174)/(74-96) 163/91 (05/14 0841) SpO2:  [97 %-98 %] 98 % (05/14 0841)  General: lying in bed, NAD Neuro: Winces and tells me to leave him alone but does not open eyes to repeated tactile stimulation, on possibly opening eyes, PERRLA, no forced gaze deviation, no facial asymmetry, withdraws to noxious stimuli with antigravity strength in all 4 extremities with normal tone  Basic Metabolic Panel: Recent Labs  Lab 06/24/22 0355 06/25/22 0222 06/26/22 0158 06/27/22 0234 06/28/22 0254 06/29/22 0245  NA 137 138 138 139 134* 143  K 3.4* 3.6 3.6 3.5 3.5 3.8  CL 107 108 111 108 110 112*  CO2 20* 19* 17* 19* 18* 18*  GLUCOSE 201* 215* 218* 187* 221* 193*  BUN 32* 33* 28* 32* 29* 21  CREATININE 1.01 0.99 0.88 1.00 1.05 0.98  CALCIUM 9.4 9.8 9.2 9.5 9.1 9.8  MG 2.0  --  2.1  --   --  2.0    CBC: Recent Labs  Lab 06/23/22 0153 06/26/22 0158 06/27/22 0234 06/28/22 0254 06/29/22 0245  WBC 9.1 10.8* 9.3 8.9 11.6*  HGB 10.9* 11.4* 11.4* 11.1* 13.1  HCT 31.0* 33.9* 33.1* 32.7* 38.1*  MCV 88.6 89.9 90.2 89.1 89.0  PLT 266 283 282 301 333     Coagulation Studies: No results for input(s): "LABPROT", "INR" in the last 72 hours.  Imaging MRI brain with and without contrast 06/29/2022:  1. Subtle abnormality again affecting the left occipital lobe, with mildly heterogeneous diffusion and FLAIR, but also a larger area of asymmetrically decreased T2 white matter signal. Favor Non-ketotic Hyperglycemic Occipital Seizure.   2. No evidence of intracranial metastatic disease, and otherwise negative for age MRI appearance of the brain.   3. Indeterminate  sclerotic lesion of the C3 vertebral body.      ASSESSMENT AND PLAN: 67 yo M with 2 days of headache and right-sided vision changes, with blood sugars running in the 300s to 400s. EEG showed seizures arising from left posterior quadrant.  CT chest abdomen pelvis showed  eterogeneously enhancing mass in the right kidney worrisome for renal cell carcinoma. Indeterminate hypodense lesion in the left kidney.  Had lumbar puncture on 07/15/2022, serum and CSF paraneoplastic panel sent to [facility] onset seizure - Likely lowered seizure threshold due to hyperglycemia versus paraneoplastic  Recommendations -Continue topiramate to 200mg  BID, Perampanel to 4mg  QHS, Vimpat 200 mg once, Keppra 2000mg  BID, Clonazepam 0.5mg  TID - Of note, patient is getting more drowsy so need to be careful not to make him too sedated - Will also consider IV solumedrol due to suspected paraneoplastic etiology if seizures persist tomorrow - continuee eeg while we are adjusting anti seizure meds - continue seizure precautions - PRN IV versed for clinical seizure -Discussed plan with Dr. Rito Ehrlich via secure chat   I have spent a total of  36 minutes with the patient reviewing hospital notes,  test results, labs and examining the patient as well as establishing an assessment and plan.  > 50% of time was spent in direct patient care.      Lindie Spruce Epilepsy [facility] For questions after 5pm please  refer to AMION to reach the Neurologist on call

## 2022-06-30 DIAGNOSIS — R569 Unspecified convulsions: Secondary | ICD-10-CM | POA: Diagnosis not present

## 2022-06-30 LAB — CBC
HCT: 37.4 % — ABNORMAL LOW (ref 39.0–52.0)
Hemoglobin: 13 g/dL (ref 13.0–17.0)
MCH: 31 pg (ref 26.0–34.0)
MCHC: 34.8 g/dL (ref 30.0–36.0)
MCV: 89.3 fL (ref 80.0–100.0)
Platelets: 321 10*3/uL (ref 150–400)
RBC: 4.19 MIL/uL — ABNORMAL LOW (ref 4.22–5.81)
RDW: 14.6 % (ref 11.5–15.5)
WBC: 10.4 10*3/uL (ref 4.0–10.5)
nRBC: 0 % (ref 0.0–0.2)

## 2022-06-30 LAB — BASIC METABOLIC PANEL
Anion gap: 10 (ref 5–15)
BUN: 17 mg/dL (ref 8–23)
CO2: 17 mmol/L — ABNORMAL LOW (ref 22–32)
Calcium: 9.6 mg/dL (ref 8.9–10.3)
Chloride: 114 mmol/L — ABNORMAL HIGH (ref 98–111)
Creatinine, Ser: 1 mg/dL (ref 0.61–1.24)
GFR, Estimated: >60 mL/min (ref >60)
Glucose, Bld: 208 mg/dL — ABNORMAL HIGH (ref 70–99)
Potassium: 3.5 mmol/L (ref 3.5–5.1)
Sodium: 141 mmol/L (ref 135–145)

## 2022-06-30 LAB — GLUCOSE, CAPILLARY
Glucose-Capillary: 212 mg/dL — ABNORMAL HIGH (ref 70–99)
Glucose-Capillary: 194 mg/dL — ABNORMAL HIGH (ref 70–99)
Glucose-Capillary: 118 mg/dL — ABNORMAL HIGH (ref 70–99)
Glucose-Capillary: 155 mg/dL — ABNORMAL HIGH (ref 70–99)

## 2022-06-30 LAB — VITAMIN B1: Vitamin B1 (Thiamine): 129.2 nmol/L (ref 66.5–200.0)

## 2022-06-30 LAB — CSF CULTURE W GRAM STAIN

## 2022-06-30 LAB — MAGNESIUM: Magnesium: 2 mg/dL (ref 1.7–2.4)

## 2022-06-30 MED ORDER — SODIUM CHLORIDE 0.45 % IV SOLN
INTRAVENOUS | Status: AC
  Administered 2022-06-30: 1000 mL via INTRAVENOUS

## 2022-06-30 MED ORDER — LEVETIRACETAM 500 MG PO TABS
1000.0000 mg | ORAL_TABLET | Freq: Two times a day (BID) | ORAL | Status: DC
  Administered 2022-06-30 – 2022-07-01 (×2): 1000 mg via ORAL
  Filled 2022-06-30 (×2): qty 2

## 2022-06-30 MED ORDER — METOPROLOL SUCCINATE ER 100 MG PO TB24
100.0000 mg | ORAL_TABLET | Freq: Every day | ORAL | Status: DC
  Administered 2022-07-01 – 2022-07-09 (×9): 100 mg via ORAL
  Filled 2022-06-30 (×9): qty 1

## 2022-06-30 MED ORDER — SODIUM CHLORIDE 0.45 % IV SOLN: INTRAVENOUS | Status: DC

## 2022-06-30 MED ORDER — CLONAZEPAM 0.5 MG PO TABS
0.5000 mg | ORAL_TABLET | Freq: Two times a day (BID) | ORAL | Status: DC
  Administered 2022-06-30 – 2022-07-01 (×2): 0.5 mg via ORAL
  Filled 2022-06-30 (×2): qty 1

## 2022-06-30 NOTE — NC FL2 (Signed)
[place] MEDICAID FL2 LEVEL OF CARE FORM     IDENTIFICATION  Patient Name: Kyle [name] Birthdate: 09-Jun-1955 Sex: male Admission Date (Current Location): 06/20/2022  [facility] and [place] Number:  Producer, television/film/video and Address:  [facility], [address] [place], [place] [address]      Provider Number: [id number]  Attending Physician Name and Address:  Alwyn Ren, MD  Relative Name and Phone Number:  Elease Hashimoto (Spouse) [phone]    Current Level of Care: Hospital Recommended Level of Care: Skilled Nursing Facility Prior Approval Number:    Date Approved/Denied:   PASRR Number: [id number]  Discharge Plan: SNF    Current Diagnoses: Patient Active Problem List   Diagnosis Date Noted   Statin intolerance 06/28/2022   Seizure (HCC) 06/21/2022   Migraines 06/21/2022   Focal seizures (HCC) 06/21/2022   SVT (supraventricular tachycardia) 06/21/2022   Visual disturbances 06/21/2022   Aortic atherosclerosis (HCC) 12/07/2016   Cholelithiasis 12/07/2016   Palpitations 08/31/2016   DDD (degenerative disc disease), lumbar 06/29/2016   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   Arthritis, hip 01/26/2014   PAD (peripheral artery disease) (HCC) 04/04/2012   Cardiovascular risk factor 04/04/2012   Hypertension 03/17/2012   Diabetes (HCC) 03/17/2012   Hyperlipidemia 03/17/2012   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    Orientation RESPIRATION BLADDER Height & Weight     Self, Place  Normal Incontinent Weight: 174 lb 13.2 oz (79.3 kg) Height:  5\' 10"  (177.8 cm)  BEHAVIORAL SYMPTOMS/MOOD NEUROLOGICAL BOWEL NUTRITION STATUS      Incontinent Diet (Please see discharge summary)  AMBULATORY STATUS COMMUNICATION OF NEEDS Skin   Extensive Assist Verbally Other (Comment), Surgical wounds (Appropriate for ethnicity,dry,Ecchymosis,Arm,Right,Left,Wound/Incision LDAs)                       Personal Care Assistance Level of Assistance  Bathing, Feeding,  Dressing Bathing Assistance: Maximum assistance Feeding assistance: Maximum assistance Dressing Assistance: Maximum assistance     Functional Limitations Info  Sight, Hearing, Speech Sight Info:  (WDL,Reading glasses) Hearing Info: Adequate Speech Info: Adequate    SPECIAL CARE FACTORS FREQUENCY  PT (By licensed PT), OT (By licensed OT)     PT Frequency: 5x min weekly OT Frequency: 5x min weekly            Contractures Contractures Info: Not present    Additional Factors Info  Code Status, Allergies, Psychotropic, Insulin Sliding Scale Code Status Info: FULL Allergies Info: Statins,Gabapentin Psychotropic Info: clonazePAM (KLONOPIN) tablet 0.5 mg 2 times daily Insulin Sliding Scale Info: insulin aspart (novoLOG) injection 0-15 Units 3 times daily with meals,insulin aspart (novoLOG) injection 0-5 Units daily at bedtime,insulin aspart (novoLOG) injection 4 Units 3 times daily with meals,  insulin glargine-yfgn (SEMGLEE) injection 18 Units daily       Current Medications (06/30/2022):  This is the current hospital active medication list Current Facility-Administered Medications  Medication Dose Route Frequency Provider Last Rate Last Admin   0.45 % sodium chloride infusion   Intravenous Continuous Alwyn Ren, MD 75 mL/hr at 06/30/22 1214 1,000 mL at 06/30/22 1214   acetaminophen (TYLENOL) tablet 650 mg  650 mg Oral Q6H PRN Gery Pray, MD       Or   acetaminophen (TYLENOL) suppository 650 mg  650 mg Rectal Q6H PRN Crosley, Debby, MD       amiodarone (PACERONE) tablet 200 mg  200 mg Oral BID Chilton Si, MD   200 mg at 06/30/22 0848   [  START ON 07/09/2022] amiodarone (PACERONE) tablet 200 mg  200 mg Oral Daily Chilton Si, MD       aspirin EC tablet 81 mg  81 mg Oral Daily Crosley, Debby, MD   81 mg at 06/30/22 0847   clonazePAM (KLONOPIN) tablet 0.5 mg  0.5 mg Oral BID Charlsie Quest, MD       enoxaparin (LOVENOX) injection 40 mg  40 mg Subcutaneous  Q24H Bhagat, Srishti L, MD   40 mg at 06/29/22 2224   gemfibrozil (LOPID) tablet 600 mg  600 mg Oral BID AC Crosley, Debby, MD   600 mg at 06/30/22 0848   insulin aspart (novoLOG) injection 0-15 Units  0-15 Units Subcutaneous TID WC Elease Etienne, MD   3 Units at 06/30/22 1245   insulin aspart (novoLOG) injection 0-5 Units  0-5 Units Subcutaneous QHS Elease Etienne, MD   2 Units at 06/27/22 2242   insulin aspart (novoLOG) injection 4 Units  4 Units Subcutaneous TID WC Elease Etienne, MD   4 Units at 06/29/22 1231   insulin glargine-yfgn (SEMGLEE) injection 18 Units  18 Units Subcutaneous Daily Osvaldo Shipper, MD   18 Units at 06/30/22 0848   lacosamide (VIMPAT) tablet 200 mg  200 mg Oral BID Bhagat, Srishti L, MD   200 mg at 06/30/22 0847   lactulose (CHRONULAC) 10 GM/15ML solution 20 g  20 g Oral TID Osvaldo Shipper, MD   20 g at 06/30/22 0850   levETIRAcetam (KEPPRA) tablet 1,000 mg  1,000 mg Oral BID Charlsie Quest, MD       [START ON 07/01/2022] metoprolol succinate (TOPROL-XL) 24 hr tablet 100 mg  100 mg Oral Daily Alwyn Ren, MD       perampanel [facility]) tablet 4 mg  4 mg Oral QHS Charlsie Quest, MD   4 mg at 06/29/22 2218   senna-docusate (Senokot-S) tablet 2 tablet  2 tablet Oral BID Osvaldo Shipper, MD   2 tablet at 06/30/22 0847   topiramate (TOPAMAX) tablet 200 mg  200 mg Oral BID Charlsie Quest, MD   200 mg at 06/30/22 1610     Discharge Medications: Please see discharge summary for a list of discharge medications.  Relevant Imaging Results:  Relevant Lab Results:   Additional Information SSN-[id number]  Delilah Shan, LCSWA

## 2022-06-30 NOTE — Plan of Care (Signed)
  Problem: Education: Goal: Ability to describe self-care measures that may prevent or decrease complications (Diabetes Survival Skills Education) will improve Outcome: Progressing Goal: Individualized Educational Video(s) Outcome: Progressing   

## 2022-06-30 NOTE — TOC Initial Note (Signed)
Transition of Care [facility]) - Initial/Assessment Note    Patient Details  Name: Kyle [name] MRN: [id number] Date of Birth: 04-09-1955  Transition of Care [facility]) CM/SW Contact:    Delilah Shan, LCSWA Phone Number: 06/30/2022, 2:23 PM  Clinical Narrative:                  CSW received consult for possible SNF placement at time of discharge. Due to patients current orientation CSW spoke with patients spouse Elease Hashimoto regarding PT recommendation of SNF placement at time of discharge for patient. Patients spouse reports PTA patient comes from home with her. Patients spouse expressed understanding of PT recommendation and is agreeable to SNF placement for patient at time of discharge. Patients spouse gave CSW permission to fax out initial referral near the [place] area for possible SNF placement.CSW discussed insurance authorization process.No further questions reported at this time. CSW to continue to follow and assist with discharge planning needs.   Expected Discharge Plan: Skilled Nursing Facility Barriers to Discharge: Continued Medical Work up   Patient Goals and CMS Choice Patient states their goals for this hospitalization and ongoing recovery are:: To return home CMS Medicare.gov Compare Post Acute Care list provided to:: Patient Represenative (must comment) (patients spouse) Choice offered to / list presented to : Spouse      Expected Discharge Plan and Services In-house Referral: Clinical Social Work Discharge Planning Services: CM Consult Post Acute Care Choice: Home Health Living arrangements for the past 2 months: Single Family Home                                      Prior Living Arrangements/Services Living arrangements for the past 2 months: Single Family Home Lives with:: Self Patient language and need for interpreter reviewed:: Yes Do you feel safe going back to the place where you live?: No   SNF  Need for Family Participation in Patient Care: Yes  (Comment) Care giver support system in place?: Yes (comment)   Criminal Activity/Legal Involvement Pertinent to Current Situation/Hospitalization: No - Comment as needed  Activities of Daily Living Home Assistive Devices/Equipment: None ADL Screening (condition at time of admission) Patient's cognitive ability adequate to safely complete daily activities?: Yes Is the patient deaf or have difficulty hearing?: No Does the patient have difficulty seeing, even when wearing glasses/contacts?: No Does the patient have difficulty concentrating, remembering, or making decisions?: No Patient able to express need for assistance with ADLs?: No Does the patient have difficulty dressing or bathing?: No Independently performs ADLs?: Yes (appropriate for developmental age) Does the patient have difficulty walking or climbing stairs?: No Weakness of Legs: None Weakness of Arms/Hands: None  Permission Sought/Granted Permission sought to share information with : Case Manager, Family Supports, Oceanographer granted to share information with : No  Share Information with NAME: Due to patients current orientation CSW spoke with patients spouse Elease Hashimoto  Permission granted to share info w AGENCY: Due to patients current orientation CSW spoke with patients spouse Patricia/SNF  Permission granted to share info w Relationship: Due to patients current orientation CSW spoke with patients spouse Elease Hashimoto  Permission granted to share info w Contact Information: Due to patients current orientation CSW spoke with patients spouse Elease Hashimoto [phone]  Emotional Assessment Appearance:: Appears older than stated age, Disheveled Attitude/Demeanor/Rapport: Unable to Assess Affect (typically observed): Unable to Assess Orientation: : Oriented to Self, Oriented to Place Alcohol /  Substance Use: Not Applicable Psych Involvement: No (comment)  Admission diagnosis:  SVT (supraventricular  tachycardia) [I47.10] Focal seizures (HCC) [R56.9] Visual disturbances [H53.9] Patient Active Problem List   Diagnosis Date Noted   Statin intolerance 06/28/2022   Seizure (HCC) 06/21/2022   Migraines 06/21/2022   Focal seizures (HCC) 06/21/2022   SVT (supraventricular tachycardia) 06/21/2022   Visual disturbances 06/21/2022   Aortic atherosclerosis (HCC) 12/07/2016   Cholelithiasis 12/07/2016   Palpitations 08/31/2016   DDD (degenerative disc disease), lumbar 06/29/2016   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 Pharmacy:  No Pharmacies Listed    Social Determinants of Health (SDOH) Social History: SDOH Screenings   Food Insecurity: No Food Insecurity (06/21/2022)  Housing: Low Risk  (06/21/2022)  Transportation Needs: No Transportation Needs (06/21/2022)  Utilities: Not At Risk (06/21/2022)  Tobacco Use: Medium Risk (06/20/2022)   SDOH Interventions:     Readmission Risk Interventions     No data to display

## 2022-06-30 NOTE — Progress Notes (Addendum)
Subjective: Taking meds p.o. but difficult to arouse.  ROS: Unable to obtain due to poor mental status  Examination  Vital signs in last 24 hours: Temp:  [97.4 F (36.3 C)-98.6 F (37 C)] 97.4 F (36.3 C) (05/15 0828) Pulse Rate:  [76-89] 89 (05/15 0828) Resp:  [13-18] 13 (05/15 0337) BP: (141-191)/(85-104) 184/104 (05/15 0828)  General: laying in bed, NAD Neuro: Winces but does not open eyes to repeated noxious stimulation, on passively opening eyes, PERRLA, no forced gaze deviation, no facial asymmetry, withdraws to noxious stimuli with antigravity strength in all 4 extremities with normal tone   Basic Metabolic Panel: Recent Labs  Lab 06/24/22 0355 06/25/22 0222 06/26/22 0158 06/27/22 0234 06/28/22 0254 06/29/22 0245 06/30/22 0257  NA 137   < > 138 139 134* 143 141  K 3.4*   < > 3.6 3.5 3.5 3.8 3.5  CL 107   < > 111 108 110 112* 114*  CO2 20*   < > 17* 19* 18* 18* 17*  GLUCOSE 201*   < > 218* 187* 221* 193* 208*  BUN 32*   < > 28* 32* 29* 21 17  CREATININE 1.01   < > 0.88 1.00 1.05 0.98 1.00  CALCIUM 9.4   < > 9.2 9.5 9.1 9.8 9.6  MG 2.0  --  2.1  --   --  2.0 2.0   < > = values in this interval not displayed.    CBC: Recent Labs  Lab 06/26/22 0158 06/27/22 0234 06/28/22 0254 06/29/22 0245 06/30/22 0257  WBC 10.8* 9.3 8.9 11.6* 10.4  HGB 11.4* 11.4* 11.1* 13.1 13.0  HCT 33.9* 33.1* 32.7* 38.1* 37.4*  MCV 89.9 90.2 89.1 89.0 89.3  PLT 283 282 301 333 321     Coagulation Studies: No results for input(s): "LABPROT", "INR" in the last 72 hours.  Imaging No new brain imaging overnight   ASSESSMENT AND PLAN: 67 yo M with 2 days of headache and right-sided vision changes, with blood sugars running in the 300s to 400s. EEG showed seizures arising from left posterior quadrant.  CT chest abdomen pelvis showed  eterogeneously enhancing mass in the right kidney worrisome for renal cell carcinoma. Indeterminate hypodense lesion in the left kidney.  Had lumbar  puncture on 07/15/2022, serum and CSF paraneoplastic panel sent to [facility]   New onset seizure Acute encephalopathy - Likely lowered seizure threshold due to hyperglycemia versus paraneoplastic -Patient is difficult to arouse.  This is most likely secondary to antiseizure medications, afebrile, no leukocytosis unlikely infection   Recommendations -Reduce Keppra to 1000 mg twice daily to minimize sedation as this was the first antiseizure medications and will likely least effective -Resolving 2.5 mg twice daily to minimize sedation -Continue topiramate to 200mg  BID, Perampanel to 4mg  QHS, Vimpat 200 mg BID - continuee eeg while we are adjusting anti seizure meds - continue seizure precautions - PRN IV versed for clinical seizure -Discussed plan with Dr. Ashley Royalty via secure chat   I have spent a total of  37 minutes with the patient reviewing hospital notes,  test results, labs and examining the patient as well as establishing an assessment and plan.  > 50% of time was spent in direct patient care.    Lindie Spruce Epilepsy [facility] For questions after 5pm please refer to AMION to reach the Neurologist on call

## 2022-06-30 NOTE — Progress Notes (Signed)
Called 6E about patient. Hospital is out of mittens and the unit did not have a NT to go sit with the patient. Tech fixed wires last night (earlier in the shift), wrapped head, and placed a stocking cap. Patient has pulled off wires again. Unit has exhausted every option to prevent patient from touching wires. Tech has also done everything to keep patient from touching.

## 2022-06-30 NOTE — Progress Notes (Signed)
PROGRESS NOTE    Kyle [name]  ZOX:[id number] DOB: 05/07/1955 DOA: 06/20/2022 PCP: No primary care provider on file.   Brief Narrative: 67 year old married male, independent, medical history significant for type II DM, HTN, HLD, PAD, recently seen in the ED on 05/19/2022 for complaints of visual disturbance x 2 days, unremarkable neurological exam/CT head/MRI brain, seen by neurology who recommended outpatient follow-up, also had episode of SVT with plans for outpatient cardiology follow-up, presented again to the ED on 06/20/2022 with complaints of palpitations, lightheadedness, dizziness, presyncopal.  He was found to be in SVT with heart rate of 177 which self terminated.  In the ED, also noted to have seizure-like activity for which neurology consulted.      Assessment & Plan:   Principal Problem:   Focal seizures (HCC) Active Problems:   Hypertension   Diabetes (HCC)   Hyperlipidemia   PAD (peripheral artery disease) (HCC)   Seizure (HCC)   Migraines   SVT (supraventricular tachycardia)   Visual disturbances   Statin intolerance  Focal seizures: Noted to have seizure-like activity in the emergency department.  Apparently ongoing since April.  PCP started Topamax 4/24 without improvement.   MRI brain from 06/29/2022 septal abnormality reflecting affecting the left occipital lobe with mildly heterogenous diffusion and FLAIR.  Favor nonketotic hyperglycemic occipital seizure.  No evidence of intracranial mets.  Indeterminate sclerotic lesion of the C3 vertebral body. MRI brain from 4/3 showed small curvilinear focus of susceptibility artifact involving the left occipital lobe likely a small focus of chronic hemosiderin staining.  Neurology was consulted.  Patient placed on continuous EEG. Currently patient is on the following medications: Clonazepam, lacosamide, Keppra, Topamax, perampanel  Clonazepam dose decreased to twice daily on 06/30/2022 by neurology Keppra dose decreased to  1000 mg twice a day by neurology on 06/30/2022 to minimize sedation.  per neurology patient has not had a seizure in the last 24 hours that they can see from the continuous EEG recording.  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  See below. Patient underwent LP.  Traumatic tap was noted.  CT head did not show any acute findings.    Neurology was considering giving high-dose steroids if his seizures were to continue. Elevated ammonia level noted although clinically he does not appear to have hepatic encephalopathy.   Given a trial of lactulose.  Ammonia level noted to be normal today. Patient/spouse have been instructed that he should not drive x 6 months.  Per spouse, he has not been driving since his ED visit on 05/19/2022.   Paroxysmal SVT/AVNRT Admission EKG showed SVT at 170.     TSH normal at 2.14.   Echocardiogram shows LVEF of 55 to 60%.  No regional wall motion abnormalities.  Grade 1 diastolic dysfunction was noted.  RV systolic function was normal.  No significant valvular abnormalities. Patient was seen by cardiology.  Patient was started on metoprolol.   On the morning of 5/10 patient again noted to be tachycardic.  EKG initially suggested atrial flutter.   Patient seen by cardiology.  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  Patient converted to sinus rhythm after he was given his a.m. dose of metoprolol. Cardiology signed off 06/28/2022.  Their recommendations are to continue amiodarone 200 twice daily now and then changed to 200 daily starting 07/09/2022.  Continue aspirin gemfibrozil and metoprolol.  Acute kidney injury, resolved  Creatinine in Care Everywhere 0.95  in January.  Presented with creatinine of 1.62.  Lisinopril held.   Patient was given IV fluids.  Improvement in renal function noted.   Consider resuming lisinopril and Aldactone at time of discharge.   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    Constipation-on lactulose   Poorly controlled type II DM with hyperglycemia: A1c 11.6.  Home metformin and glipizide on hold.  Currently on glargine and SSI.   Essential hypertension/hyperaldosteronism/hypokalemia Home hydralazine, lisinopril and spironolactone on hold.   Currently on metoprolol. Increase metoprolol to 100 mg daily. Continue amiodarone loading    Hyperlipidemia/hypertriglyceridemia Continue gemfibrozil.  Statin intolerant.  Per cardiology, recommend lipid clinic evaluation and they will arrange follow-up   PAD: Continue aspirin and gemfibrozil.   Normocytic anemia: Likely dilutional drop in hemoglobin which is stable.  No evidence of overt bleeding.    Estimated body mass index is 25.08 kg/m as calculated from the following:   Height as of this encounter: 5\' 10"  (1.778 m).   Weight as of this encounter: 79.3 kg.  DVT prophylaxis: Lovenox  code Status: Full code  family Communication: wife Disposition Plan:  Status is: Inpatient Remains inpatient appropriate because: seizures   Consultants:  neuro  Procedures: eeg Antimicrobials: none Subjective: Very drowsy Unable to communicate or follow commands  Objective: Vitals:   06/29/22 0841 06/29/22 2036 06/30/22 0337 06/30/22 0828  BP: (!) 163/91 (!) 141/85 (!) 191/104 (!) 184/104  Pulse: 75 76 87 89  Resp: 16 18 13    Temp: 97.6 F (36.4 C) 98.2 F (36.8 C) 98.6 F (37 C) (!) 97.4 F (36.3 C)  TempSrc: Oral Oral Oral Axillary  SpO2: 98%     Weight:      Height:        Intake/Output Summary (Last 24 hours) at 06/30/2022 1249 Last data filed at 06/30/2022 0441 Gross per 24 hour  Intake 1027.85 ml  Output --  Net 1027.85 ml   Filed Weights   06/20/22 2210 06/21/22 0320  Weight: 79.4 kg 79.3 kg    Examination:  General exam: Appears calm and comfortable  Respiratory system: Clear to  auscultation. Respiratory effort normal. Cardiovascular system: S1 & S2 heard, RRR. No JVD, murmurs, rubs, gallops or clicks. No pedal edema. Gastrointestinal system: Abdomen is nondistended, soft and nontender. No organomegaly or masses felt. Normal bowel sounds heard. Central nervous system: Alert and oriented. No focal neurological deficits. Extremities: Symmetric 5 x 5 power. Skin: No rashes, lesions or ulcers Psychiatry: Judgement and insight appear normal. Mood & affect appropriate.     Data Reviewed: I have personally reviewed following labs and imaging studies  CBC: Recent Labs  Lab 06/26/22 0158 06/27/22 0234 06/28/22 0254 06/29/22 0245 06/30/22 0257  WBC 10.8* 9.3 8.9 11.6* 10.4  HGB 11.4* 11.4* 11.1* 13.1 13.0  HCT 33.9* 33.1* 32.7* 38.1* 37.4*  MCV 89.9 90.2 89.1 89.0 89.3  PLT 283 282 301 333 321   Basic Metabolic Panel: Recent Labs  Lab 06/24/22 0355 06/25/22 0222 06/26/22 0158 06/27/22 0234 06/28/22 0254 06/29/22 0245 06/30/22 0257  NA 137   < > 138 139 134* 143 141  K 3.4*   < > 3.6 3.5 3.5 3.8 3.5  CL 107   < > 111 108 110 112* 114*  CO2 20*   < > 17* 19* 18* 18* 17*  GLUCOSE 201*   < > 218* 187* 221* 193* 208*  BUN 32*   < >  28* 32* 29* 21 17  CREATININE 1.01   < > 0.88 1.00 1.05 0.98 1.00  CALCIUM 9.4   < > 9.2 9.5 9.1 9.8 9.6  MG 2.0  --  2.1  --   --  2.0 2.0   < > = values in this interval not displayed.   GFR: Estimated Creatinine Clearance: 73 mL/min (by C-G formula based on SCr of 1 mg/dL). Liver Function Tests: No results for input(s): "AST", "ALT", "ALKPHOS", "BILITOT", "PROT", "ALBUMIN" in the last 168 hours. No results for input(s): "LIPASE", "AMYLASE" in the last 168 hours. Recent Labs  Lab 06/27/22 1246 06/29/22 0245  AMMONIA 59* 29   Coagulation Profile: No results for input(s): "INR", "PROTIME" in the last 168 hours. Cardiac Enzymes: No results for input(s): "CKTOTAL", "CKMB", "CKMBINDEX", "TROPONINI" in the last 168  hours. BNP (last 3 results) No results for input(s): "PROBNP" in the last 8760 hours. HbA1C: No results for input(s): "HGBA1C" in the last 72 hours. CBG: Recent Labs  Lab 06/29/22 1502 06/29/22 1606 06/29/22 2129 06/30/22 0831 06/30/22 1218  GLUCAP 129* 118* 164* 212* 194*   Lipid Profile: No results for input(s): "CHOL", "HDL", "LDLCALC", "TRIG", "CHOLHDL", "LDLDIRECT" in the last 72 hours. Thyroid Function Tests: No results for input(s): "TSH", "T4TOTAL", "FREET4", "T3FREE", "THYROIDAB" in the last 72 hours. Anemia Panel: No results for input(s): "VITAMINB12", "FOLATE", "FERRITIN", "TIBC", "IRON", "RETICCTPCT" in the last 72 hours. Sepsis Labs: No results for input(s): "PROCALCITON", "LATICACIDVEN" in the last 168 hours.  Recent Results (from the past 240 hour(s))  CSF culture w Gram Stain     Status: None (Preliminary result)   Collection Time: 06/28/22  4:00 PM   Specimen: PATH Cytology CSF; Cerebrospinal Fluid  Result Value Ref Range Status   Specimen Description CSF  Final   Special Requests NONE  Final   Gram Stain NO WBC SEEN NO ORGANISMS SEEN CYTOSPIN SMEAR   Final   Culture   Final    NO GROWTH 2 DAYS Performed at [facility], [address] [place], [place] [address]    Report Status PENDING  Incomplete         Radiology Studies: MR BRAIN W WO CONTRAST  Result Date: 06/29/2022 CLINICAL DATA:  67 year old male with evidence of right renal malignancy. Intractable seizure. Metastatic disease evaluation. EXAM: MRI HEAD WITHOUT AND WITH CONTRAST TECHNIQUE: Multiplanar, multiecho pulse sequences of the brain and surrounding structures were obtained without and with intravenous contrast. CONTRAST:  8mL GADAVIST GADOBUTROL 1 MMOL/ML IV SOLN COMPARISON:  Lumbar puncture and Head CT yesterday. Brain MRI without contrast 05/19/2022. FINDINGS: Brain: Subtle abnormal diffusion in the left occipital lobe series 5, images 72-74. This is less apparent on coronal  DWI, and might be secondary to abnormality of the sulci rather than the cortex here. Negative noncontrast CT appearance of this area yesterday. No associated abnormal enhancement, dural or leptomeningeal thickening. No definite gyral edema but mildly heterogeneous FLAIR signal on series 11, image 8. SWI appears negative here today. However, there is conspicuously decreased white matter T2 hypointensity in the left occipital lobe. See series 10 images 9-11, also coronal series 17, image 5. Where as bilateral cerebral white matter T2 signal appears fairly symmetric elsewhere in the brain. Elsewhere Curvilinear SWI was demonstrated near this area last month. But was in the more superior occipital lobe at that time. No other abnormal diffusion. No midline shift, mass effect, evidence of mass lesion, ventriculomegaly. Cervicomedullary junction and pituitary are within normal limits.  Normal basilar cisterns. No evidence of intraventricular blood or debris. Normal background gray and white matter signal for age. No abnormal enhancement identified. audio healer Vascular: Major intracranial vascular flow voids are preserved. Major dural venous sinuses are enhancing following contrast and appear to be patent. Skull and upper cervical spine: Nonspecific decreased T1 marrow signal in the central C3 vertebral body partially identified, present last month. Other visible bone marrow signal is within normal limits. Sinuses/Orbits: Stable, orbits appear negative. Mild paranasal sinus mucosal thickening has increased from last month. Other: Visible internal auditory structures appear normal. Benign-appearing suboccipital scalp lipoma in the midline. IMPRESSION: 1. Subtle abnormality again affecting the left occipital lobe, with mildly heterogeneous diffusion and FLAIR, but also a larger area of asymmetrically decreased T2 white matter signal. Favor Non-ketotic Hyperglycemic Occipital Seizure. 2. No evidence of intracranial metastatic  disease, and otherwise negative for age MRI appearance of the brain. 3. Indeterminate sclerotic lesion of the C3 vertebral body. Electronically Signed   By: Odessa Fleming M.D.   On: 06/29/2022 11:36   CT HEAD WO CONTRAST ( )  Result Date: 06/28/2022 CLINICAL DATA:  Initial evaluation for subarachnoid hemorrhage, xanthochromia on CSF. EXAM: CT HEAD WITHOUT CONTRAST TECHNIQUE: Contiguous axial images were obtained from the base of the skull through the vertex without intravenous contrast. RADIATION DOSE REDUCTION: This exam was performed according to the departmental dose-optimization program which includes automated exposure control, adjustment of the mA and/or kV according to patient size and/or use of iterative reconstruction technique. COMPARISON:  Prior Study from 05/19/2022. FINDINGS: Brain: Cerebral volume within normal limits. Mild cerebral white matter disease, most likely related to chronic small vessel ischemic disease. No acute intracranial hemorrhage. No acute large vessel territory infarct. No mass lesion, midline shift or mass effect. No hydrocephalus or extra-axial fluid collection. Few small foci of pneumocephalus noted, suspected to be related to recent lumbar puncture. Vascular: No abnormal hyperdense vessel. Scattered vascular calcifications noted within the carotid siphons. Skull: Scalp soft tissues demonstrate no acute finding. Calvarium intact. Sinuses/Orbits: Globes and orbital soft tissues within normal limits. Scattered mucosal thickening present about the ethmoidal air cells. Mild-to-moderate mucosal thickening with pneumatized secretions about the right maxillary sinus. No mastoid effusion. Other: None. IMPRESSION: 1. No acute intracranial abnormality. 2. Few small foci of pneumocephalus, likely secondary to recent lumbar puncture. 3. Mild cerebral white matter disease, most likely related to chronic small vessel ischemic disease. 4. Right maxillary sinusitis. Electronically Signed   By:  Rise Mu M.D.   On: 06/28/2022 22:37   DG FL GUIDED LUMBAR PUNCTURE  Result Date: 06/28/2022 CLINICAL DATA:  Patient with intractable seizures. Request is for lumbar puncture for further evaluation EXAM: LUMBAR PUNCTURE UNDER FLUOROSCOPY PROCEDURE: An appropriate skin entry site was determined fluoroscopically. Operator donned sterile gloves and mask. Skin site was marked, then prepped with Betadine, draped in usual sterile fashion, and infiltrated locally with 1% lidocaine. A 18 gauge spinal needle advanced into the thecal sac at lL3- L4 from a laterality interlaminar approach. Pink tinged turning to clear colorless CSF spontaneously returned. 5.5 ml CSF were collected and divided among 4 sterile vials for the requested laboratory studies. Opening pressure could not be measured but was low. The needle was then removed. The patient tolerated the procedure well and there were no complications. FLUOROSCOPY: Radiation Exposure Index (as provided by the fluoroscopic device): 70.2 mGy Kerma IMPRESSION: Technically successful lumbar puncture under fluoroscopy. Low opening pressure, too low to be measured directly. Due to limited CSF return, only  5.5 cc could be obtained. This exam was performed by Anders Grant under the direct supervised and interpreted by Dr. Jeronimo Greaves Electronically Signed   By: Jeronimo Greaves M.D.   On: 06/28/2022 17:00        Scheduled Meds:  amiodarone  200 mg Oral BID   [START ON 07/09/2022] amiodarone  200 mg Oral Daily   aspirin EC  81 mg Oral Daily   clonazePAM  0.5 mg Oral BID   enoxaparin (LOVENOX) injection  40 mg Subcutaneous Q24H   gemfibrozil  600 mg Oral BID AC   insulin aspart  0-15 Units Subcutaneous TID WC   insulin aspart  0-5 Units Subcutaneous QHS   insulin aspart  4 Units Subcutaneous TID WC   insulin glargine-yfgn  18 Units Subcutaneous Daily   lacosamide  200 mg Oral BID   lactulose  20 g Oral TID   levETIRAcetam  1,000 mg Oral BID    metoprolol succinate  50 mg Oral Daily   perampanel  4 mg Oral QHS   senna-docusate  2 tablet Oral BID   topiramate  200 mg Oral BID   Continuous Infusions:  sodium chloride 1,000 mL (06/30/22 1214)     LOS: 9 days    Time spent: 38 min  Alwyn Ren, MD 06/30/2022, 12:49 PM

## 2022-06-30 NOTE — Care Plan (Signed)
EEG maintenanced leads C4 and C3. No skin breakdown at those skin sites.

## 2022-06-30 NOTE — Progress Notes (Signed)
Wife just called RN. Asking for updates. Very concerned about patient not eating and changes in behavior. Has felt she has not been kept informed enough. States she will be here at 14:30 and she wants to speak to "Dr Iver Nestle, Dr Rito Ehrlich, and everyone".

## 2022-06-30 NOTE — Progress Notes (Signed)
Occupational Therapy Treatment Patient Details Name: Kyle [name] MRN: [id number] DOB: January 15, 1956 Today's Date: 06/30/2022   History of present illness Pt is 67 year old presented [facility]  [facility] for SVT and visual changes. Neurology consulted and felt likely focal seizures with postictal Todds vision impairment. PMH - DM, HTN, PAD,   OT comments  Pt with lethargy, on continuous EEG. Pt requires 2 person assist for all bed mobility and requires mod to max assist for sitting balance at EOB. Pt initiating drinking from straw inconsistently. He is not following commands and eyes are closed. Updated d/c recommendation to < 3 hours a day inpatient rehab.    Recommendations for follow up therapy are one component of a multi-disciplinary discharge planning process, led by the attending physician.  Recommendations may be updated based on patient status, additional functional criteria and insurance authorization.    Assistance Recommended at Discharge Frequent or constant Supervision/Assistance  Patient can return home with the following  Two people to help with walking and/or transfers;Two people to help with bathing/dressing/bathroom;Assistance with cooking/housework;Assistance with feeding;Direct supervision/assist for medications management;Direct supervision/assist for financial management;Assist for transportation;Help with stairs or ramp for entrance   Equipment Recommendations  Other (comment) (defer to next venue)    Recommendations for Other Services      Precautions / Restrictions Precautions Precautions: Fall;Other (comment) Precaution Comments: seizures Restrictions Weight Bearing Restrictions: No       Mobility Bed Mobility Overal bed mobility: Needs Assistance Bed Mobility: Rolling, Supine to Sit, Sit to Supine Rolling: Total assist   Supine to sit: +2 for physical assistance, Total assist Sit to supine: +2 for physical assistance, Total assist   General bed  mobility comments: Assist for all aspects    Transfers                         Balance Overall balance assessment: Needs assistance Sitting-balance support: Bilateral upper extremity supported Sitting balance-Leahy Scale: Zero Sitting balance - Comments: Sat EOB x 10-15 minutes with mod to max assist for balance. Minimal righting reactions                                   ADL either performed or assessed with clinical judgement   ADL Overall ADL's : Needs assistance/impaired Eating/Feeding: Maximal assistance;Sitting;Bed level Eating/Feeding Details (indicate cue type and reason): to drink ensure Grooming: Total assistance;Sitting;Bed level;Wash/dry hands           Upper Body Dressing : Total assistance;Bed level           Toileting- Clothing Manipulation and Hygiene: Total assistance;+2 for physical assistance;Bed level Toileting - Clothing Manipulation Details (indicate cue type and reason): pt with bowel incontinence            Extremity/Trunk Assessment              Vision       Perception     Praxis      Cognition Arousal/Alertness: Lethargic Behavior During Therapy: Flat affect Overall Cognitive Status: Impaired/Different from baseline Area of Impairment: Orientation, Following commands, Safety/judgement, Memory                 Orientation Level: Disoriented to, Place   Memory: Decreased short-term memory Following Commands:  (not following commands) Safety/Judgement: Decreased awareness of safety, Decreased awareness of deficits Awareness: Intellectual   General Comments: Pt with eyes closed throughout. When  eyes manually opened pt not holding them open. Did not follow any commands. Spontaneous movements ie to scratch nose. Pt nonverbal initially. Some verbalizations at the end of session some appropriate but most not. Repeatedly saying t bone at end of session.        Exercises      Shoulder Instructions        General Comments VSS on RA    Pertinent Vitals/ Pain       Pain Assessment Pain Assessment: Faces Faces Pain Scale: No hurt  Home Living                                          Prior Functioning/Environment              Frequency  Min 2X/week        Progress Toward Goals  OT Goals(current goals can now be found in the care plan section)  Progress towards OT goals: Not progressing toward goals - comment  Acute Rehab OT Goals OT Goal Formulation: Patient unable to participate in goal setting Time For Goal Achievement: 07/12/22 Potential to Achieve Goals: Fair  Plan Discharge plan needs to be updated    Co-evaluation    PT/OT/SLP Co-Evaluation/Treatment: Yes Reason for Co-Treatment: Necessary to address cognition/behavior during functional activity;Complexity of the patient's impairments (multi-system involvement);For patient/therapist safety PT goals addressed during session: Mobility/safety with mobility;Balance OT goals addressed during session: ADL's and self-care;Strengthening/ROM      AM-PAC OT "6 Clicks" Daily Activity     Outcome Measure   Help from another person eating meals?: A Lot Help from another person taking care of personal grooming?: Total Help from another person toileting, which includes using toliet, bedpan, or urinal?: Total Help from another person bathing (including washing, rinsing, drying)?: Total Help from another person to put on and taking off regular upper body clothing?: Total Help from another person to put on and taking off regular lower body clothing?: Total 6 Click Score: 7    End of Session    OT Visit Diagnosis: Muscle weakness (generalized) (M62.81);Other symptoms and signs involving cognitive function   Activity Tolerance Patient limited by lethargy   Patient Left in bed;with call bell/phone within reach;with bed alarm set   Nurse Communication          Time: [phone] OT Time  Calculation (min): 36 min  Charges: OT General Charges $OT Visit: 1 Visit OT Treatments $Therapeutic Activity: 8-22 mins  Berna Spare, OTR/L Acute Rehabilitation Services Office: [phone]  Evern Bio 06/30/2022, 11:25 AM

## 2022-06-30 NOTE — Progress Notes (Signed)
RN was called multiple times for patient picking at EEG leads. Attempting to acquire safety mitts and will place if delivered. Quite drowsy but also restless in bed. Often needing significant amount of touch and voice to be awakened/respond. Was able to take oral meds with much coaching through. Not alert enough to answer all orientation questions or follow all commands.

## 2022-06-30 NOTE — Plan of Care (Signed)
  Problem: Education: Goal: Ability to describe self-care measures that may prevent or decrease complications (Diabetes Survival Skills Education) will improve 06/30/2022 1059 by Genevie Ann, RN Outcome: Progressing 06/30/2022 1059 by Genevie Ann, RN Outcome: Progressing Goal: Individualized Educational Video(s) 06/30/2022 1059 by Genevie Ann, RN Outcome: Progressing 06/30/2022 1059 by Genevie Ann, RN Outcome: Progressing

## 2022-06-30 NOTE — Procedures (Signed)
Patient Name: [name]  MRN: [id number]  Epilepsy Attending: Charlsie Quest  Referring Physician/Provider: Gordy Councilman, MD  Duration: 06/29/2022 1930 to 06/30/2022 1930   Patient history: 67yo M with seizure like activity. EEG to evaluate for seizure.    Level of alertness: Awake, asleep   AEDs during EEG study: LEV, TPM, LCM, Clonazepam, Perampanel   Technical aspects: This EEG study was done with scalp electrodes positioned according to the 10-20 International system of electrode placement. Electrical activity was reviewed with band pass filter of 1-70Hz , sensitivity of 7 uV/mm, display speed of 18mm/sec with a 60Hz  notched filter applied as appropriate. EEG data were recorded continuously and digitally stored.  Video monitoring was available and reviewed as appropriate.   Description: No posterior dominant rhythm was seen. Sleep was characterized by vertex waves, sleep spindles (12 to 14 Hz), maximal frontocentral region. EEG showed continuous generalized 3-6hz  theta-delta slowing.    ABNORMALITY - Continuous slow, generalized    IMPRESSION: This study is suggestive of moderate diffuse encephalopathy, non specific etiology,. No seizures or epileptiform discharges were seen.    Kyle [name]

## 2022-06-30 NOTE — Progress Notes (Signed)
Physical Therapy Treatment Patient Details Name: Kyle [name] MRN: [id number] DOB: January 29, 1956 Today's Date: 06/30/2022   History of Present Illness Pt is 67 year old presented to [facility] on  06/20/22 for SVT and visual changes. Neurology consulted and felt likely focal seizures with postictal Todds vision impairment. PMH - DM, HTN, PAD,    PT Comments    Pt continues to be lethargic and not following commands. Pt tolerated sitting eOB x 10-15 minutes with assist. Given current status will benefit from continued post acute therapy, <3 hours/day    Recommendations for follow up therapy are one component of a multi-disciplinary discharge planning process, led by the attending physician.  Recommendations may be updated based on patient status, additional functional criteria and insurance authorization.  Follow Up Recommendations  Can patient physically be transported by private vehicle: No    Assistance Recommended at Discharge Frequent or constant Supervision/Assistance  Patient can return home with the following Two people to help with walking and/or transfers;Two people to help with bathing/dressing/bathroom;Assistance with cooking/housework;Assistance with feeding;Direct supervision/assist for medications management;Direct supervision/assist for financial management;Help with stairs or ramp for entrance;Assist for transportation   Equipment Recommendations  Other (comment) (To be determined)    Recommendations for Other Services       Precautions / Restrictions Precautions Precautions: Fall;Other (comment) Precaution Comments: seizures Restrictions Weight Bearing Restrictions: No     Mobility  Bed Mobility Overal bed mobility: Needs Assistance Bed Mobility: Supine to Sit, Sit to Supine, Rolling Rolling: Total assist   Supine to sit: +2 for physical assistance, Total assist Sit to supine: +2 for physical assistance, Total assist   General bed mobility comments: Assist for  all aspects    Transfers                        Ambulation/Gait                   Stairs             Wheelchair Mobility    Modified Rankin (Stroke Patients Only)       Balance Overall balance assessment: Needs assistance Sitting-balance support: Bilateral upper extremity supported, Feet supported Sitting balance-Leahy Scale: Poor Sitting balance - Comments: Sat EOB x 10-15 minutes with mod to max assist for balance. Minimal righting reactions Postural control: Posterior lean, Right lateral lean                                  Cognition Arousal/Alertness: Lethargic Behavior During Therapy: Flat affect Overall Cognitive Status: Difficult to assess                                 General Comments: Pt with eyes closed throughout. When eyes manually opened pt not holding them open. Did not follow any commands. Spontaneous movements ie to scratch nose. Pt nonverbal initially. Some verbalizations at the end of session some appropriate but most not. Repeatedly saying t bone at end of session.        Exercises      General Comments General comments (skin integrity, edema, etc.): VSS on RA      Pertinent Vitals/Pain Pain Assessment Pain Assessment: Faces Faces Pain Scale: No hurt    Home Living  Prior Function            PT Goals (current goals can now be found in the care plan section) Progress towards PT goals: Not progressing toward goals - comment    Frequency    Min 1X/week      PT Plan Discharge plan needs to be updated    Co-evaluation PT/OT/SLP Co-Evaluation/Treatment: Yes Reason for Co-Treatment: Necessary to address cognition/behavior during functional activity;Complexity of the patient's impairments (multi-system involvement);For patient/therapist safety PT goals addressed during session: Mobility/safety with mobility;Balance        AM-PAC PT "6 Clicks"  Mobility   Outcome Measure  Help needed turning from your back to your side while in a flat bed without using bedrails?: Total Help needed moving from lying on your back to sitting on the side of a flat bed without using bedrails?: Total Help needed moving to and from a bed to a chair (including a wheelchair)?: Total Help needed standing up from a chair using your arms (e.g., wheelchair or bedside chair)?: Total Help needed to walk in hospital room?: Total Help needed climbing 3-5 steps with a railing? : Total 6 Click Score: 6    End of Session   Activity Tolerance: Patient limited by lethargy Patient left: in bed;with call bell/phone within reach;with bed alarm set Nurse Communication: Mobility status PT Visit Diagnosis: Other abnormalities of gait and mobility (R26.89);Muscle weakness (generalized) (M62.81);Other symptoms and signs involving the nervous system (W29.562)     Time: 1308-6578 PT Time Calculation (min) (ACUTE ONLY): 39 min  Charges:  $Therapeutic Activity: 23-37 mins                     [facility] PT Acute Rehabilitation Services Office [phone]    Angelina Ok [facility] 06/30/2022, 10:54 AM

## 2022-07-01 ENCOUNTER — Ambulatory Visit: Payer: Medicare Other | Admitting: Interventional Cardiology

## 2022-07-01 DIAGNOSIS — R569 Unspecified convulsions: Secondary | ICD-10-CM | POA: Diagnosis not present

## 2022-07-01 LAB — GLUCOSE, CAPILLARY
Glucose-Capillary: 120 mg/dL — ABNORMAL HIGH (ref 70–99)
Glucose-Capillary: 284 mg/dL — ABNORMAL HIGH (ref 70–99)
Glucose-Capillary: 152 mg/dL — ABNORMAL HIGH (ref 70–99)
Glucose-Capillary: 131 mg/dL — ABNORMAL HIGH (ref 70–99)

## 2022-07-01 LAB — MISC LABCORP TEST (SEND OUT): Labcorp test code: 505575

## 2022-07-01 MED ORDER — LEVETIRACETAM 500 MG PO TABS
500.0000 mg | ORAL_TABLET | Freq: Two times a day (BID) | ORAL | Status: DC
  Administered 2022-07-01 – 2022-07-05 (×8): 500 mg via ORAL
  Filled 2022-07-01 (×8): qty 1

## 2022-07-01 MED ORDER — INSULIN GLARGINE-YFGN 100 UNIT/ML ~~LOC~~ SOLN
12.0000 [IU] | Freq: Every day | SUBCUTANEOUS | Status: DC
  Administered 2022-07-01 – 2022-07-09 (×9): 12 [IU] via SUBCUTANEOUS
  Filled 2022-07-01 (×9): qty 0.12

## 2022-07-01 MED ORDER — CLONAZEPAM 0.125 MG PO TBDP
0.2500 mg | ORAL_TABLET | Freq: Two times a day (BID) | ORAL | Status: DC
  Administered 2022-07-01 – 2022-07-03 (×4): 0.25 mg via ORAL
  Filled 2022-07-01 (×5): qty 2

## 2022-07-01 NOTE — Procedures (Addendum)
Patient Name: Kyle [name]  MRN: [id number]  Epilepsy Attending: Charlsie Quest  Referring Physician/Provider: Gordy Councilman, MD  Duration: 06/30/2022 1930 to 07/01/2022 1930   Patient history: 67yo M with seizure like activity. EEG to evaluate for seizure.    Level of alertness: Awake, asleep   AEDs during EEG study: LEV, TPM, LCM, Clonazepam, Perampanel   Technical aspects: This EEG study was done with scalp electrodes positioned according to the 10-20 International system of electrode placement. Electrical activity was reviewed with band pass filter of 1-70Hz , sensitivity of 7 uV/mm, display speed of 17mm/sec with a 60Hz  notched filter applied as appropriate. EEG data were recorded continuously and digitally stored.  Video monitoring was available and reviewed as appropriate.   Description: No posterior dominant rhythm was seen. Sleep was characterized by vertex waves, sleep spindles (12 to 14 Hz), maximal frontocentral region. EEG showed continuous generalized 3-6hz  theta-delta slowing.    ABNORMALITY - Continuous slow, generalized    IMPRESSION: This study is suggestive of moderate diffuse encephalopathy, non specific etiology,. No seizures or epileptiform discharges were seen.   Karlissa Aron Annabelle Harman

## 2022-07-01 NOTE — Progress Notes (Signed)
PROGRESS NOTE    Kyle [name]  ZOX:[id number] DOB: 1955-12-10 DOA: 06/20/2022 PCP: No primary care provider on file.   Brief Narrative: 67 year old married male, independent, medical history significant for type II DM, HTN, HLD, PAD, recently seen in the ED on 05/19/2022 for complaints of visual disturbance x 2 days, unremarkable neurological exam/CT head/MRI brain, seen by neurology who recommended outpatient follow-up, also had episode of SVT with plans for outpatient cardiology follow-up, presented again to the ED on 06/20/2022 with complaints of palpitations, lightheadedness, dizziness, presyncopal.  He was found to be in SVT with heart rate of 177 which self terminated.  In the ED, also noted to have seizure-like activity for which neurology consulted.      Assessment & Plan:   Principal Problem:   Focal seizures (HCC) Active Problems:   Hypertension   Diabetes (HCC)   Hyperlipidemia   PAD (peripheral artery disease) (HCC)   Seizure (HCC)   Migraines   SVT (supraventricular tachycardia)   Visual disturbances   Statin intolerance  Focal seizures: Noted to have seizure-like activity in the emergency department.  Apparently ongoing since April.  PCP started Topamax 4/24 without improvement.   MRI brain from 06/29/2022 septal abnormality reflecting affecting the left occipital lobe with mildly heterogenous diffusion and FLAIR.  Favor nonketotic hyperglycemic occipital seizure.  No evidence of intracranial mets.  Indeterminate sclerotic lesion of the C3 vertebral body. MRI brain from 4/3 showed small curvilinear focus of susceptibility artifact involving the left occipital lobe likely a small focus of chronic hemosiderin staining.  Neurology was consulted.  Patient placed on continuous EEG. Currently patient is on the following medications: Clonazepam, lacosamide, Keppra, Topamax, perampanel  Clonazepam dose decreased to twice daily on 06/30/2022 by neurology Keppra dose decreased to  1000 mg twice a day by neurology on 06/30/2022 to minimize sedation. 5/15- per neurology patient has not had a seizure in the last 24 hours that they can see from the continuous EEG recording.  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  See below. Patient underwent LP.  Traumatic tap was noted.  CT head did not show any acute findings.    Neurology was considering giving high-dose steroids if his seizures were to continue. Elevated ammonia level noted although clinically he does not appear to have hepatic encephalopathy.   Given a trial of lactulose.  Ammonia level noted to be normal today.   Paroxysmal SVT/AVNRT Admission EKG showed SVT at 170.     TSH normal at 2.14.   Echocardiogram shows LVEF of 55 to 60%.  No regional wall motion abnormalities.  Grade 1 diastolic dysfunction was noted.  RV systolic function was normal.  No significant valvular abnormalities. Patient was seen by cardiology.  Patient was started on metoprolol.   On the morning of 5/10 patient again noted to be tachycardic.  EKG initially suggested atrial flutter.   Patient seen by cardiology.  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  Patient converted to sinus rhythm after he was given his a.m. dose of metoprolol. Cardiology signed off 06/28/2022.  Their recommendations are to continue amiodarone 200 twice daily now and then changed to 200 daily starting 07/09/2022.  Continue aspirin gemfibrozil and metoprolol.  Acute kidney injury, resolved  Creatinine in Care Everywhere 0.95 in January.  Presented with creatinine of 1.62.  Lisinopril held.   Patient was given IV fluids.  Improvement in renal function noted.  Consider resuming lisinopril and Aldactone at time of discharge.   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    Constipation-on lactulose   Poorly  controlled type II DM with hyperglycemia: A1c 11.6.  Home metformin and glipizide on hold.  Currently on glargine and SSI.with him not eating decreased the dos eof semiglee on 5/16  CBG (last 3)  Recent Labs    06/30/22 1603 06/30/22 2143 07/01/22 0835  GLUCAP 118* 155* 120*    Essential hypertension/hyperaldosteronism/hypokalemia Home hydralazine, lisinopril and spironolactone on hold.   Currently on metoprolol. Increase metoprolol to 100 mg daily. Continue amiodarone loading    Hyperlipidemia/hypertriglyceridemia Continue gemfibrozil.  Statin intolerant.  Per cardiology, recommend lipid clinic evaluation and they will arrange follow-up   PAD: Continue aspirin and gemfibrozil.   Normocytic anemia: Likely dilutional drop in hemoglobin which is stable.  No evidence of overt bleeding.    Estimated body mass index is 25.08 kg/m as calculated from the following:   Height as of this encounter: 5\' 10"  (1.778 m).   Weight as of this encounter: 79.3 kg.  DVT prophylaxis: Lovenox  code Status: Full code  family Communication: wife Disposition Plan:  Status is: Inpatient Remains inpatient appropriate because: seizures   Consultants:  neuro  Procedures: eeg Antimicrobials: none Subjective: Very drowsy Unable to communicate or follow commands  Objective: Vitals:   06/30/22 1420 06/30/22 2042 07/01/22 0517 07/01/22 0837  BP:  (!) 146/67 132/81 (!) 156/86  Pulse: 75 80 67 66  Resp:  11 10 12   Temp:    97.7 F (36.5 C)  TempSrc: Oral   Oral  SpO2:  100%    Weight:      Height:       No intake or output data in the 24 hours ending 07/01/22 0849  Filed Weights   06/20/22 2210 06/21/22 0320  Weight: 79.4 kg 79.3 kg    Examination:  General exam:hard to arouse snoring  Respiratory system: Clear to auscultation. Respiratory effort normal. Cardiovascular system: S1 & S2 heard, RRR. No JVD, murmurs, rubs, gallops or clicks. No pedal edema. Gastrointestinal system:  Abdomen is nondistended, soft and nontender. No organomegaly or masses felt. Normal bowel sounds heard. Central nervous system: hard to arouse  Data Reviewed: I have personally reviewed following labs and imaging studies  CBC: Recent Labs  Lab 06/26/22 0158 06/27/22 0234 06/28/22 0254 06/29/22 0245 06/30/22 0257  WBC 10.8* 9.3 8.9 11.6* 10.4  HGB 11.4* 11.4* 11.1* 13.1 13.0  HCT 33.9* 33.1* 32.7* 38.1* 37.4*  MCV 89.9 90.2 89.1 89.0 89.3  PLT 283 282 301 333 321    Basic Metabolic Panel: Recent Labs  Lab 06/26/22 0158 06/27/22 0234 06/28/22 0254 06/29/22 0245 06/30/22 0257  NA 138 139 134* 143 141  K 3.6 3.5 3.5 3.8 3.5  CL 111 108 110 112* 114*  CO2 17* 19* 18* 18* 17*  GLUCOSE 218* 187* 221* 193* 208*  BUN 28* 32* 29* 21 17  CREATININE 0.88 1.00 1.05 0.98 1.00  CALCIUM 9.2 9.5 9.1 9.8 9.6  MG 2.1  --   --  2.0 2.0    GFR: Estimated Creatinine Clearance: 73 mL/min (by C-G formula based on SCr of 1 mg/dL). Liver Function Tests: No results for input(s): "AST", "ALT", "ALKPHOS", "BILITOT", "PROT", "ALBUMIN" in the last 168 hours. No results for input(s): "LIPASE", "AMYLASE" in the last 168 hours. Recent Labs  Lab 06/27/22 1246 06/29/22 0245  AMMONIA  59* 29    Coagulation Profile: No results for input(s): "INR", "PROTIME" in the last 168 hours. Cardiac Enzymes: No results for input(s): "CKTOTAL", "CKMB", "CKMBINDEX", "TROPONINI" in the last 168 hours. BNP (last 3 results) No results for input(s): "PROBNP" in the last 8760 hours. HbA1C: No results for input(s): "HGBA1C" in the last 72 hours. CBG: Recent Labs  Lab 06/30/22 0831 06/30/22 1218 06/30/22 1603 06/30/22 2143 07/01/22 0835  GLUCAP 212* 194* 118* 155* 120*    Lipid Profile: No results for input(s): "CHOL", "HDL", "LDLCALC", "TRIG", "CHOLHDL", "LDLDIRECT" in the last 72 hours. Thyroid Function Tests: No results for input(s): "TSH", "T4TOTAL", "FREET4", "T3FREE", "THYROIDAB" in the last 72  hours. Anemia Panel: No results for input(s): "VITAMINB12", "FOLATE", "FERRITIN", "TIBC", "IRON", "RETICCTPCT" in the last 72 hours. Sepsis Labs: No results for input(s): "PROCALCITON", "LATICACIDVEN" in the last 168 hours.  Recent Results (from the past 240 hour(s))  CSF culture w Gram Stain     Status: None (Preliminary result)   Collection Time: 06/28/22  4:00 PM   Specimen: PATH Cytology CSF; Cerebrospinal Fluid  Result Value Ref Range Status   Specimen Description CSF  Final   Special Requests NONE  Final   Gram Stain NO WBC SEEN NO ORGANISMS SEEN CYTOSPIN SMEAR   Final   Culture   Final    NO GROWTH 2 DAYS Performed at [facility], [address] [place], [place] [address]    Report Status PENDING  Incomplete         Radiology Studies: MR BRAIN W WO CONTRAST  Result Date: 06/29/2022 CLINICAL DATA:  67 year old male with evidence of right renal malignancy. Intractable seizure. Metastatic disease evaluation. EXAM: MRI HEAD WITHOUT AND WITH CONTRAST TECHNIQUE: Multiplanar, multiecho pulse sequences of the brain and surrounding structures were obtained without and with intravenous contrast. CONTRAST:  8mL GADAVIST GADOBUTROL 1 MMOL/ML IV SOLN COMPARISON:  Lumbar puncture and Head CT yesterday. Brain MRI without contrast 05/19/2022. FINDINGS: Brain: Subtle abnormal diffusion in the left occipital lobe series 5, images 72-74. This is less apparent on coronal DWI, and might be secondary to abnormality of the sulci rather than the cortex here. Negative noncontrast CT appearance of this area yesterday. No associated abnormal enhancement, dural or leptomeningeal thickening. No definite gyral edema but mildly heterogeneous FLAIR signal on series 11, image 8. SWI appears negative here today. However, there is conspicuously decreased white matter T2 hypointensity in the left occipital lobe. See series 10 images 9-11, also coronal series 17, image 5. Where as bilateral cerebral white  matter T2 signal appears fairly symmetric elsewhere in the brain. Elsewhere Curvilinear SWI was demonstrated near this area last month. But was in the more superior occipital lobe at that time. No other abnormal diffusion. No midline shift, mass effect, evidence of mass lesion, ventriculomegaly. Cervicomedullary junction and pituitary are within normal limits. Normal basilar cisterns. No evidence of intraventricular blood or debris. Normal background gray and white matter signal for age. No abnormal enhancement identified. audio healer Vascular: Major intracranial vascular flow voids are preserved. Major dural venous sinuses are enhancing following contrast and appear to be patent. Skull and upper cervical spine: Nonspecific decreased T1 marrow signal in the central C3 vertebral body partially identified, present last month. Other visible bone marrow signal is within normal limits. Sinuses/Orbits: Stable, orbits appear negative. Mild paranasal sinus mucosal thickening has increased from last month. Other: Visible internal auditory structures appear normal. Benign-appearing suboccipital scalp lipoma in the midline. IMPRESSION: 1. Subtle abnormality  again affecting the left occipital lobe, with mildly heterogeneous diffusion and FLAIR, but also a larger area of asymmetrically decreased T2 white matter signal. Favor Non-ketotic Hyperglycemic Occipital Seizure. 2. No evidence of intracranial metastatic disease, and otherwise negative for age MRI appearance of the brain. 3. Indeterminate sclerotic lesion of the C3 vertebral body. Electronically Signed   By: Odessa Fleming M.D.   On: 06/29/2022 11:36        Scheduled Meds:  amiodarone  200 mg Oral BID   [START ON 07/09/2022] amiodarone  200 mg Oral Daily   aspirin EC  81 mg Oral Daily   clonazePAM  0.5 mg Oral BID   enoxaparin (LOVENOX) injection  40 mg Subcutaneous Q24H   gemfibrozil  600 mg Oral BID AC   insulin aspart  0-15 Units Subcutaneous TID WC   insulin  aspart  0-5 Units Subcutaneous QHS   insulin aspart  4 Units Subcutaneous TID WC   insulin glargine-yfgn  18 Units Subcutaneous Daily   lacosamide  200 mg Oral BID   lactulose  20 g Oral TID   levETIRAcetam  1,000 mg Oral BID   metoprolol succinate  100 mg Oral Daily   perampanel  4 mg Oral QHS   senna-docusate  2 tablet Oral BID   topiramate  200 mg Oral BID   Continuous Infusions:  sodium chloride 75 mL/hr at 07/01/22 0132     LOS: 10 days    Time spent: 38 min  Alwyn Ren, MD 07/01/2022, 8:49 AM

## 2022-07-01 NOTE — Progress Notes (Signed)
vLTM maintenance  All impedances below 10kohms  No skin breakdown at all skin sites 

## 2022-07-01 NOTE — Progress Notes (Signed)
Subjective: NAEO. More awake today, pleasant, answered all questions appropriately. No concerns.   ROS: negative except above  Examination  Vital signs in last 24 hours: Temp:  [97.7 F (36.5 C)] 97.7 F (36.5 C) (05/16 0837) Pulse Rate:  [66-80] 67 (05/16 0910) Resp:  [10-12] 12 (05/16 0837) BP: (132-156)/(67-86) 156/86 (05/16 0837) SpO2:  [100 %] 100 % (05/15 2042)  General: lying in bed, NAD Neuro: MS: Alert, oriented, follows commands CN: pupils equal and reactive,  EOMI, face symmetric, tongue midline, normal sensation over face, Motor: 4/5 strength in all 4 extremities   Basic Metabolic Panel: Recent Labs  Lab 06/26/22 0158 06/27/22 0234 06/28/22 0254 06/29/22 0245 06/30/22 0257  NA 138 139 134* 143 141  K 3.6 3.5 3.5 3.8 3.5  CL 111 108 110 112* 114*  CO2 17* 19* 18* 18* 17*  GLUCOSE 218* 187* 221* 193* 208*  BUN 28* 32* 29* 21 17  CREATININE 0.88 1.00 1.05 0.98 1.00  CALCIUM 9.2 9.5 9.1 9.8 9.6  MG 2.1  --   --  2.0 2.0    CBC: Recent Labs  Lab 06/26/22 0158 06/27/22 0234 06/28/22 0254 06/29/22 0245 06/30/22 0257  WBC 10.8* 9.3 8.9 11.6* 10.4  HGB 11.4* 11.4* 11.1* 13.1 13.0  HCT 33.9* 33.1* 32.7* 38.1* 37.4*  MCV 89.9 90.2 89.1 89.0 89.3  PLT 283 282 301 333 321     Coagulation Studies: No results for input(s): "LABPROT", "INR" in the last 72 hours.  Imaging    ASSESSMENT AND PLAN: 67 yo M with 2 days of headache and right-sided vision changes, with blood sugars running in the 300s to 400s. EEG showed seizures arising from left posterior quadrant.  CT chest abdomen pelvis showed  eterogeneously enhancing mass in the right kidney worrisome for renal cell carcinoma. Indeterminate hypodense lesion in the left kidney.  Had lumbar puncture on 07/15/2022, serum and CSF paraneoplastic panel sent to [facility]   New onset seizure Acute toxic encephalopathy, resolved - Likely lowered seizure threshold due to hyperglycemia versus paraneoplastic -  Acute encephalopathy, due to anti seizure medications, now resolved after reducing meds   Recommendations -Reduce Keppra to 500 mg twice daily to minimize sedation  - Reduce Clonazepam to 0.25 mg twice daily to minimize sedation -Continue topiramate to 200mg  BID, Perampanel to 4mg  QHS, Vimpat 200 mg BID - continuee eeg while we are adjusting anti seizure meds, hope to dc tomorrow if remains seizure free - continue seizure precautions - PRN IV versed for clinical seizure -Discussed plan with Dr. Ashley Royalty via secure chat and wife at bedside   I have spent a total of  39 minutes with the patient reviewing hospital notes,  test results, labs and examining the patient as well as establishing an assessment and plan.  > 50% of time was spent in direct patient care.      Lindie Spruce Epilepsy [facility] For questions after 5pm please refer to AMION to reach the Neurologist on call

## 2022-07-01 NOTE — TOC Progression Note (Addendum)
Transition of Care [facility]) - Progression Note    Patient Details  Name: Kyle [name] MRN: [id number] Date of Birth: February 17, 1955  Transition of Care [facility]) CM/SW Contact  Delilah Shan, LCSWA Phone Number: 07/01/2022, 3:32 PM  Clinical Narrative:     Update-4:23pm- CSW received call back from Elease Hashimoto patients spouse who accepted SNF bed offer with [facility] for patient. CSW spoke with Whitney with [facility] who confirmed SNF bed for patient when medically ready. CSW will continue to follow.  CSW provided SNF bed offers to patients spouse Elease Hashimoto. Elease Hashimoto informed CSW she will review and give CSW call back with SNF choice for patient. CSW will continue to follow and assist with patients dc planning needs.  Expected Discharge Plan: Skilled Nursing Facility Barriers to Discharge: Continued Medical Work up  Expected Discharge Plan and Services In-house Referral: Clinical Social Work Discharge Planning Services: CM Consult Post Acute Care Choice: Home Health Living arrangements for the past 2 months: Single Family Home                                       Social Determinants of Health (SDOH) Interventions SDOH Screenings   Food Insecurity: No Food Insecurity (06/21/2022)  Housing: Low Risk  (06/21/2022)  Transportation Needs: No Transportation Needs (06/21/2022)  Utilities: Not At Risk (06/21/2022)  Tobacco Use: Medium Risk (06/20/2022)    Readmission Risk Interventions     No data to display

## 2022-07-02 ENCOUNTER — Other Ambulatory Visit (HOSPITAL_COMMUNITY): Payer: Self-pay

## 2022-07-02 DIAGNOSIS — R569 Unspecified convulsions: Secondary | ICD-10-CM | POA: Diagnosis not present

## 2022-07-02 LAB — CBC
HCT: 33.7 % — ABNORMAL LOW (ref 39.0–52.0)
Hemoglobin: 11.8 g/dL — ABNORMAL LOW (ref 13.0–17.0)
MCH: 30.6 pg (ref 26.0–34.0)
MCHC: 35 g/dL (ref 30.0–36.0)
MCV: 87.5 fL (ref 80.0–100.0)
Platelets: 278 10*3/uL (ref 150–400)
RBC: 3.85 MIL/uL — ABNORMAL LOW (ref 4.22–5.81)
RDW: 14.3 % (ref 11.5–15.5)
WBC: 7.9 10*3/uL (ref 4.0–10.5)
nRBC: 0 % (ref 0.0–0.2)

## 2022-07-02 LAB — MAGNESIUM: Magnesium: 1.9 mg/dL (ref 1.7–2.4)

## 2022-07-02 LAB — COMPREHENSIVE METABOLIC PANEL
ALT: 26 U/L (ref 0–44)
AST: 20 U/L (ref 15–41)
Albumin: 3.2 g/dL — ABNORMAL LOW (ref 3.5–5.0)
Alkaline Phosphatase: 94 U/L (ref 38–126)
Anion gap: 9 (ref 5–15)
BUN: 16 mg/dL (ref 8–23)
CO2: 17 mmol/L — ABNORMAL LOW (ref 22–32)
Calcium: 9.1 mg/dL (ref 8.9–10.3)
Chloride: 111 mmol/L (ref 98–111)
Creatinine, Ser: 0.94 mg/dL (ref 0.61–1.24)
GFR, Estimated: >60 mL/min (ref >60)
Glucose, Bld: 161 mg/dL — ABNORMAL HIGH (ref 70–99)
Potassium: 3.1 mmol/L — ABNORMAL LOW (ref 3.5–5.1)
Sodium: 137 mmol/L (ref 135–145)
Total Bilirubin: 0.7 mg/dL (ref 0.3–1.2)
Total Protein: 6.3 g/dL — ABNORMAL LOW (ref 6.5–8.1)

## 2022-07-02 LAB — ANTIPHOSPHOLIPID SYNDROME EVAL, BLD
Anticardiolipin IgA: <9 APL U/mL (ref 0–11)
Anticardiolipin IgG: <9 GPL U/mL (ref 0–14)
Anticardiolipin IgM: <9 MPL U/mL (ref 0–12)
DRVVT: 40.9 s (ref 0.0–47.0)
PTT Lupus Anticoagulant: 39.2 s (ref 0.0–43.5)
Phosphatydalserine, IgA: <1 APS Units (ref 0–19)
Phosphatydalserine, IgG: 9 Units (ref 0–30)
Phosphatydalserine, IgM: <10 Units (ref 0–30)

## 2022-07-02 LAB — CSF CULTURE W GRAM STAIN

## 2022-07-02 LAB — GLUCOSE, CAPILLARY
Glucose-Capillary: 143 mg/dL — ABNORMAL HIGH (ref 70–99)
Glucose-Capillary: 190 mg/dL — ABNORMAL HIGH (ref 70–99)
Glucose-Capillary: 137 mg/dL — ABNORMAL HIGH (ref 70–99)
Glucose-Capillary: 130 mg/dL — ABNORMAL HIGH (ref 70–99)

## 2022-07-02 LAB — THYROID ANTIBODIES
Thyroglobulin Antibody: <1 IU/mL (ref 0.0–0.9)
Thyroperoxidase Ab SerPl-aCnc: 10 IU/mL (ref 0–34)

## 2022-07-02 LAB — OLIGOCLONAL BANDS, CSF + SERM

## 2022-07-02 MED ORDER — POTASSIUM CHLORIDE CRYS ER 20 MEQ PO TBCR
40.0000 meq | EXTENDED_RELEASE_TABLET | Freq: Once | ORAL | Status: AC
  Administered 2022-07-02: 40 meq via ORAL
  Filled 2022-07-02: qty 2

## 2022-07-02 MED ORDER — SODIUM CHLORIDE 0.9 % IV SOLN: INTRAVENOUS | Status: DC

## 2022-07-02 NOTE — TOC Benefit Eligibility Note (Signed)
Patient Product/process development scientist completed.    The patient is currently admitted and upon discharge could be taking Valtoco.  The current 30 day co-pay is $599.96. Patient has deductible. Possible co-pay after being met is $54.96   The patient is insured through [employer]   This test claim was processed through [facility]- copay amounts may vary at other pharmacies due to pharmacy/plan contracts, or as the patient moves through the different stages of their insurance plan.

## 2022-07-02 NOTE — Progress Notes (Signed)
Received a call from [facility] that patient has pulled off several leads.  Will assess when schedule allows.

## 2022-07-02 NOTE — Procedures (Addendum)
Patient Name: Kyle [name]  MRN: [id number]  Epilepsy Attending: Charlsie Quest  Referring Physician/Provider: Gordy Councilman, MD  Duration: 07/01/2022 1930 to 07/02/2022 4098   Patient history: 67yo M with seizure like activity. EEG to evaluate for seizure.    Level of alertness: Awake, asleep   AEDs during EEG study: LEV, TPM, LCM, Clonazepam, Perampanel   Technical aspects: This EEG study was done with scalp electrodes positioned according to the 10-20 International system of electrode placement. Electrical activity was reviewed with band pass filter of 1-70Hz , sensitivity of 7 uV/mm, display speed of 77mm/sec with a 60Hz  notched filter applied as appropriate. EEG data were recorded continuously and digitally stored.  Video monitoring was available and reviewed as appropriate.   Description: The posterior dominant rhythm consists of 9-10 Hz activity of moderate voltage (25-35 uV) seen predominantly in posterior head regions, symmetric and reactive to eye opening and eye closing. Sleep was characterized by vertex waves, sleep spindles (12 to 14 Hz), maximal frontocentral region.  EEG showed continuous generalized 3-6hz  theta-delta slowing.    ABNORMALITY - Continuous slow, generalized    IMPRESSION: This study is suggestive of moderate diffuse encephalopathy, non specific etiology. No seizures or epileptiform discharges were seen.    Correne Lalani Annabelle Harman

## 2022-07-02 NOTE — Progress Notes (Signed)
LTM EEG discontinued - no skin breakdown at unhook. [facility] notified 

## 2022-07-02 NOTE — TOC Progression Note (Addendum)
Transition of Care [facility]) - Progression Note    Patient Details  Name: Kyle [name] MRN: [id number] Date of Birth: [birth year]  Transition of Care [facility]) CM/SW Contact  Delilah Shan, LCSWA Phone Number: 07/02/2022, 12:55 PM  Clinical Narrative:     Patient has SN[facility] bed at [facility] when medically ready for dc.  CSW spoke with Whitney with [facility] who confirmed they can accept patient over the weekend if medically ready.CSW will continue to follow and assist with patients dc planning needs.  Expected Discharge Plan: Skilled Nursing Facility Barriers to Discharge: Continued Medical Work up  Expected Discharge Plan and Services In-house Referral: Clinical Social Work Discharge Planning Services: CM Consult Post Acute Care Choice: Home Health Living arrangements for the past 2 months: Single Family Home                                       Social Determinants of Health (SDOH) Interventions SDOH Screenings   Food Insecurity: No Food Insecurity (06/21/2022)  Housing: Low Risk  (06/21/2022)  Transportation Needs: No Transportation Needs (06/21/2022)  Utilities: Not At Risk (06/21/2022)  Tobacco Use: Medium Risk (06/20/2022)    Readmission Risk Interventions     No data to display

## 2022-07-02 NOTE — Progress Notes (Addendum)
Subjective: No acute events overnight.  No new concerns.  ROS: negative except above  Examination  Vital signs in last 24 hours: Temp:  [97.4 F (36.3 C)-98.7 F (37.1 C)] 97.4 F (36.3 C) (05/17 0752) Pulse Rate:  [63-69] 66 (05/17 0752) Resp:  [13-22] 17 (05/17 0752) BP: (120-153)/(72-81) 143/72 (05/17 0752)  General: lying in bed, NAD Neuro: MS: Alert, oriented, follows commands, able to do simple math but poor attention span CN: pupils equal and reactive,  EOMI, face symmetric, tongue midline, normal sensation over face, Motor: 5/5 strength in all 4 extremities  Basic Metabolic Panel: Recent Labs  Lab 06/26/22 0158 06/27/22 0234 06/28/22 0254 06/29/22 0245 06/30/22 0257 07/02/22 0356  NA 138 139 134* 143 141 137  K 3.6 3.5 3.5 3.8 3.5 3.1*  CL 111 108 110 112* 114* 111  CO2 17* 19* 18* 18* 17* 17*  GLUCOSE 218* 187* 221* 193* 208* 161*  BUN 28* 32* 29* 21 17 16   CREATININE 0.88 1.00 1.05 0.98 1.00 0.94  CALCIUM 9.2 9.5 9.1 9.8 9.6 9.1  MG 2.1  --   --  2.0 2.0  --     CBC: Recent Labs  Lab 06/27/22 0234 06/28/22 0254 06/29/22 0245 06/30/22 0257 07/02/22 0356  WBC 9.3 8.9 11.6* 10.4 7.9  HGB 11.4* 11.1* 13.1 13.0 11.8*  HCT 33.1* 32.7* 38.1* 37.4* 33.7*  MCV 90.2 89.1 89.0 89.3 87.5  PLT 282 301 333 321 278     Coagulation Studies: No results for input(s): "LABPROT", "INR" in the last 72 hours.  Imaging No new brain imaging overnight  ASSESSMENT AND PLAN: 67 yo M with 2 days of headache and right-sided vision changes, with blood sugars running in the 300s to 400s. EEG showed seizures arising from left posterior quadrant.  CT chest abdomen pelvis showed  eterogeneously enhancing mass in the right kidney worrisome for renal cell carcinoma. Indeterminate hypodense lesion in the left kidney.  Had lumbar puncture on 07/15/2022, serum and CSF paraneoplastic panel sent to [facility]   New onset seizure Acute toxic encephalopathy, resolved - Likely  lowered seizure threshold due to hyperglycemia versus paraneoplastic - Acute encephalopathy, due to anti seizure medications, now resolved after reducing meds   Recommendations -Continue following Anti seizure meds: 1.Keppra 500 mg twice daily 2. Clonazepam to 0.25 mg twice daily  3. Topiramate 200mg  BID 4. Perampanel to 4mg  QHS 5. Vimpat 200 mg BID -DC LTM EEG as no further seizures -Will schedule follow-up with Dr. Teresa Coombs in 4 weeks, order placed.  May need to repeat MRI brain at that time to see if MRI changes of improved/resolved -Keppra was not very effective in controlling his seizures. Therefore, might be able to wean off completely during follow-up if patient remains seizure-free. Additionally, patient's seizures initially had clinical signs (gaze deviation, flashing lights).  However after starting antiseizure medication, no clinical signs were noted.  Therefore may benefit from ambulatory EEG to help guide weaning of antiseizure medications -Also needs referral to urology for for further evaluation of renal mass -Will check co-pay for intranasal Valtoco 20 mg (10 mg in each nostril) for seizure lasting more than 2 minutes.  If co-pay is really high/not covered by insurance, can take clonazepam 1 mg oral dissolving tablet for seizure lasting more than 2 minutes - continue seizure precautions - PRN IV versed for clinical seizure while inpatient -Discussed plan with Dr. Ashley Royalty via secure chat   Seizure precautions: Per [facility] statutes, patients with seizures are  not allowed to drive until they have been seizure-free for six months and cleared by a physician    Use caution when using heavy equipment or power tools. Avoid working on ladders or at heights. Take showers instead of baths. Ensure the water temperature is not too high on the home water heater. Do not go swimming alone. Do not lock yourself in a room alone (i.e. bathroom). When caring for infants or small children,  sit down when holding, feeding, or changing them to minimize risk of injury to the child in the event you have a seizure. Maintain good sleep hygiene. Avoid alcohol.    If patient has another seizure, call 911 and bring them back to the ED if: A.  The seizure lasts longer than 5 minutes.      B.  The patient doesn't wake shortly after the seizure or has new problems such as difficulty seeing, speaking or moving following the seizure C.  The patient was injured during the seizure D.  The patient has a temperature over 102 F (39C) E.  The patient vomited during the seizure and now is having trouble breathing    During the Seizure   - First, ensure adequate ventilation and place patients on the floor on their left side  Loosen clothing around the neck and ensure the airway is patent. If the patient is clenching the teeth, do not force the mouth open with any object as this can cause severe damage - Remove all items from the surrounding that can be hazardous. The patient may be oblivious to what's happening and may not even know what he or she is doing. If the patient is confused and wandering, either gently guide him/her away and block access to outside areas - Reassure the individual and be comforting - Call 911. In most cases, the seizure ends before EMS arrives. However, there are cases when seizures may last over 3 to 5 minutes. Or the individual may have developed breathing difficulties or severe injuries. If a pregnant patient or a person with diabetes develops a seizure, it is prudent to call an ambulance. - Finally, if the patient does not regain full consciousness, then call EMS. Most patients will remain confused for about 45 to 90 minutes after a seizure, so you must use judgment in calling for help.    After the Seizure (Postictal Stage)   After a seizure, most patients experience confusion, fatigue, muscle pain and/or a headache. Thus, one should permit the individual to sleep. For the  next few days, reassurance is essential. Being calm and helping reorient the person is also of importance.   Most seizures are painless and end spontaneously. Seizures are not harmful to others but can lead to complications such as stress on the lungs, brain and the heart. Individuals with prior lung problems may develop labored breathing and respiratory distress.    I have spent a total of  39 minutes with the patient reviewing hospital notes,  test results, labs and examining the patient as well as establishing an assessment and plan.  > 50% of time was spent in direct patient care.    Lindie Spruce Epilepsy [facility] For questions after 5pm please refer to AMION to reach the Neurologist on call

## 2022-07-02 NOTE — Care Management Important Message (Signed)
Important Message  Patient Details  Name: Kyle [name] MRN: [id number] Date of Birth: 1955-05-17   Medicare Important Message Given:  Yes     Renie Ora 07/02/2022, 10:41 AM

## 2022-07-02 NOTE — Progress Notes (Signed)
Occupational Therapy Treatment Patient Details Name: Kyle [name] MRN: [id number] DOB: 1955/08/24 Today's Date: 07/02/2022   History of present illness Pt is 67 year old presented to [facility] on 06/20/22 for SVT and visual changes. Neurology consulted and felt likely focal seizures with postictal Todds vision impairment, CT head no acute findings. LP 5/13. 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  PMH - DM, HTN, PAD,   OT comments  Pt slightly more alert today but continues to demo poor attention to tasks and required multimodal cues for mobility and functional tasks. Pt agreeable to OOB activity. Pt incontinent of bowel and unaware, required max - mod +2 for sit - stand to RW for transfer to [facility]. Pt able to stand at [facility] [address] with mod A for balance with total A for posterior hygiene and UB dressing max A seated on BSC. Pt required max A +2 to use RW for functional mobility to walk to sink for hand hygiene with multimodal cues to initiate and correctly sequence task. Pt positioned in recliner with chair alarm in place at end of session and nursing notified. OT will continue to follow acutely to maximize level of function and safety   Recommendations for follow up therapy are one component of a multi-disciplinary discharge planning process, led by the attending physician.  Recommendations may be updated based on patient status, additional functional criteria and insurance authorization.    Assistance Recommended at Discharge Frequent or constant Supervision/Assistance  Patient can return home with the following  Two people to help with walking and/or transfers;Two people to help with bathing/dressing/bathroom;Assistance with cooking/housework;Assistance with feeding;Direct supervision/assist for medications management;Direct supervision/assist for financial management;Assist for transportation;Help with stairs or ramp for  entrance   Equipment Recommendations   (TBD at next venue of care)    Recommendations for Other Services      Precautions / Restrictions Precautions Precautions: Fall;Other (comment) Precaution Comments: seizures Restrictions Weight Bearing Restrictions: No       Mobility Bed Mobility Overal bed mobility: Needs Assistance Bed Mobility: Supine to Sit     Supine to sit: Min guard     General bed mobility comments: Fair initiation, more cues for self-assist and pt using bed features to perform; increased time.    Transfers Overall transfer level: Needs assistance Equipment used: Rolling walker (2 wheels), None Transfers: Sit to/from Stand, Bed to chair/wheelchair/BSC Sit to Stand: Max assist, +2 physical assistance, Mod assist     Step pivot transfers: Max assist, +2 physical assistance     General transfer comment: EOB<>RW with manual assist for safe UE placement and increased time to perform and modA +2; posterior bias upon standing each attempt; also from BSC<>RW x2 and to recliner, increased physical assist with fatigue needing maxA after initial transfer for all subsequent transfers     Balance Overall balance assessment: Needs assistance Sitting-balance support: Single extremity supported Sitting balance-Leahy Scale: Fair Sitting balance - Comments: no LOB needing Supervision for safety Postural control: Posterior lean Standing balance support: Bilateral upper extremity supported, Reliant on assistive device for balance, During functional activity Standing balance-Leahy Scale: Poor Standing balance comment: Poor to zero, pt needs +2 maxA for backward stepping and turning as he fatigued, maxA +1 for static standing with 1 or 0 UE support                           ADL either performed or assessed with  clinical judgement   ADL Overall ADL's : Needs assistance/impaired     Grooming: Wash/dry hands;Wash/dry face;Moderate assistance;Standing;Cueing for  safety;Cueing for sequencing           Upper Body Dressing : Maximal assistance;Sitting       Toilet Transfer: Maximal assistance;Moderate assistance;Ambulation;Rolling walker (2 wheels);BSC/3in1   Toileting- Clothing Manipulation and Hygiene: Total assistance;+2 for physical assistance;Sit to/from stand Toileting - Clothing Manipulation Details (indicate cue type and reason): pt with bowel incontinence     Functional mobility during ADLs: Moderate assistance;Minimal assistance;+2 for physical assistance      Extremity/Trunk Assessment Upper Extremity Assessment Upper Extremity Assessment: Generalized weakness   Lower Extremity Assessment Lower Extremity Assessment: Defer to PT evaluation   Cervical / Trunk Assessment Cervical / Trunk Assessment: Normal    Vision Baseline Vision/History: 1 Wears glasses Ability to See in Adequate Light: 0 Adequate     Perception     Praxis      Cognition Arousal/Alertness: Awake/alert Behavior During Therapy: Flat affect Overall Cognitive Status: Impaired/Different from baseline Area of Impairment: Orientation, Following commands, Safety/judgement, Memory, Attention, Awareness, Problem solving                 Orientation Level: Disoriented to, Situation   Memory: Decreased short-term memory Following Commands: Follows one step commands with increased time, Follows multi-step commands inconsistently Safety/Judgement: Decreased awareness of safety, Decreased awareness of deficits   Problem Solving: Slow processing, Decreased initiation, Difficulty sequencing, Requires verbal cues, Requires tactile cues General Comments: Pt slightly more alert this date and agreeable to OOB mobility, but poor attention to task and needs dense multimodal cues for safety and body mechanics with transfers and standing/gait tasks. Pt able to state location, month/year, but when asked for his birthday pt repetitively states "[age][birth year]" until  redirected after ~30 sec. Pt states he lives with his mother but per chart review, he lives with his spouse. Pt more fatigued and closing eyes toward end of session.        Exercises      Shoulder Instructions       General Comments HR WFL, BP somewhat elevated 160's/90's initially in supine then 150's/90's sitting on BSC; no dizziness reported.    Pertinent Vitals/ Pain       Pain Assessment Pain Assessment: Faces Faces Pain Scale: Hurts a little bit Pain Location: possible discomfort with hygiene/peri care Pain Descriptors / Indicators: Grimacing Pain Intervention(s): Monitored during session, Repositioned  Home Living                                          Prior Functioning/Environment              Frequency  Min 2X/week        Progress Toward Goals  OT Goals(current goals can now be found in the care plan section)  Progress towards OT goals: Progressing toward goals     Plan Discharge plan remains appropriate    Co-evaluation      Reason for Co-Treatment: Necessary to address cognition/behavior during functional activity;Complexity of the patient's impairments (multi-system involvement);For patient/therapist safety;To address functional/ADL transfers PT goals addressed during session: Mobility/safety with mobility;Balance;Proper use of DME OT goals addressed during session: ADL's and self-care;Proper use of Adaptive equipment and DME      AM-PAC OT "6 Clicks" Daily Activity     Outcome Measure   Help  from another person eating meals?: None Help from another person taking care of personal grooming?: A Lot Help from another person toileting, which includes using toliet, bedpan, or urinal?: Total Help from another person bathing (including washing, rinsing, drying)?: Total Help from another person to put on and taking off regular upper body clothing?: A Lot Help from another person to put on and taking off regular lower body  clothing?: Total 6 Click Score: 11    End of Session Equipment Utilized During Treatment: Gait belt;Rolling walker (2 wheels);Other (comment) (BSC)  OT Visit Diagnosis: Muscle weakness (generalized) (M62.81);Other symptoms and signs involving cognitive function;Other abnormalities of gait and mobility (R26.89);Unsteadiness on feet (R26.81)   Activity Tolerance Patient tolerated treatment well   Patient Left in chair;with call bell/phone within reach;with chair alarm set   Nurse Communication Mobility status        Time: 4098-1191 OT Time Calculation (min): 35 min  Charges: OT General Charges $OT Visit: 1 Visit OT Treatments $Self Care/Home Management : 8-22 mins    Galen Manila 07/02/2022, 12:55 PM

## 2022-07-02 NOTE — Progress Notes (Signed)
Physical Therapy Treatment Patient Details Name: Kyle [name] MRN: [id number] DOB: 02-Sep-1955 Today's Date: 07/02/2022   History of Present Illness Pt is 67 year old presented to [facility] on 06/20/22 for SVT and visual changes. Neurology consulted and felt likely focal seizures with postictal Todds vision impairment, CT head no acute findings. LP 5/13. 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  PMH - DM, HTN, PAD,    PT Comments    Pt received in supine, alert and oriented x2-3 initially and agreeable to therapy session with emphasis on seated/standing balance, transfer training and gait initiation in room. Pt needing increased physical assist as he fatigues and demos very slow processing and poor sequencing for familiar tasks (transfers, standing balance, ADLs), needing dense multimodal cues and up to +2 maxA for static/dynamic standing tasks with and without UE support. Pt with improved seated balance, needing only Supervision mostly with single UE support. Poor coordination with standing and pt at times with apparent word finding difficulty with incorrect word choice intermittently and appears confused from baseline, not oriented fully to self or PLOF. Pt continues to benefit from PT services to progress toward functional mobility goals, continue to recommend short term low intensity post-acute rehab <3 hours/day, pt making good progress toward goals but remains well below functional baseline.  Recommendations for follow up therapy are one component of a multi-disciplinary discharge planning process, led by the attending physician.  Recommendations may be updated based on patient status, additional functional criteria and insurance authorization.  Follow Up Recommendations  Can patient physically be transported by private vehicle: No    Assistance Recommended at Discharge Frequent or constant  Supervision/Assistance  Patient can return home with the following Two people to help with walking and/or transfers;Two people to help with bathing/dressing/bathroom;Assistance with cooking/housework;Assistance with feeding;Direct supervision/assist for medications management;Direct supervision/assist for financial management;Help with stairs or ramp for entrance;Assist for transportation   Equipment Recommendations  Other (comment) (TBD post-acute)    Recommendations for Other Services       Precautions / Restrictions Precautions Precautions: Fall;Other (comment) Precaution Comments: seizures Restrictions Weight Bearing Restrictions: No     Mobility  Bed Mobility Overal bed mobility: Needs Assistance Bed Mobility: Supine to Sit     Supine to sit: Min guard     General bed mobility comments: Fair initiation, more cues for self-assist and pt using bed features to perform; increased time.    Transfers Overall transfer level: Needs assistance Equipment used: Rolling walker (2 wheels), None Transfers: Sit to/from Stand, Bed to chair/wheelchair/BSC Sit to Stand: Max assist, +2 physical assistance, Mod assist   Step pivot transfers: Max assist, +2 physical assistance       General transfer comment: EOB<>RW with manual assist for safe UE placement and increased time to perform and modA +2; posterior bias upon standing each attempt; also from BSC<>RW x2 and to recliner, increased physical assist with fatigue needing maxA after initial transfer for all subsequent transfers    Ambulation/Gait Ambulation/Gait assistance: Max assist, +2 safety/equipment, +2 physical assistance Gait Distance (Feet): 10 Feet (~33ft pivot to BSC, then ~44ft to sink, then ~60ft from sink to chair at bedside) Assistive device: Rolling walker (2 wheels), 2 person hand held assist Gait Pattern/deviations: Step-to pattern, Scissoring, Decreased dorsiflexion - right, Decreased dorsiflexion - left, Leaning  posteriorly, Staggering left, Staggering right, Narrow base of support       General Gait Details: Pt slightly steadier with RW support but  poor RW management needing +2 for safety and line mgmt and with no AD, +2 maxA via HHA and gait belt. Pt has more difficulty with backward stepping and turning, requiring nearly totalA +2 for backward stepping as he fatigued. Poor insight into fatigue level.       Balance Overall balance assessment: Needs assistance Sitting-balance support: Single extremity supported Sitting balance-Leahy Scale: Fair Sitting balance - Comments: no LOB needing Supervision for safety Postural control: Posterior lean (Increased L lean as he fatigued.) Standing balance support: Bilateral upper extremity supported, Reliant on assistive device for balance Standing balance-Leahy Scale: Poor Standing balance comment: Poor to zero, pt needs +2 maxA for backward stepping and turning as he fatigued, maxA +1 for static standing with 1 or 0 UE support                            Cognition Arousal/Alertness: Awake/alert (more lethargic toward end of session) Behavior During Therapy: Flat affect Overall Cognitive Status: Impaired/Different from baseline Area of Impairment: Orientation, Following commands, Safety/judgement, Memory, Attention, Awareness, Problem solving                 Orientation Level: Disoriented to (pt unable to correctly state DOB, but was able to say [birth year] and [place]) Current Attention Level: Focused Memory: Decreased short-term memory Following Commands: Follows one step commands with increased time, Follows multi-step commands inconsistently (multimodal cues needed, at times more inconsistent with simple commands) Safety/Judgement: Decreased awareness of safety, Decreased awareness of deficits Awareness: Intellectual Problem Solving: Slow processing, Decreased initiation, Difficulty sequencing, Requires verbal cues, Requires tactile  cues General Comments: Pt slightly more alert this date and agreeable to OOB mobility, but poor attention to task and needs dense multimodal cues for safety and body mechanics with transfers and standing/gait tasks. Pt able to state location, month/year, but when asked for his birthday pt repetitively states "[age][birth year] 25" until redirected after ~30 sec. Pt states he lives with his mother but per chart review, he lives with his spouse. Pt more fatigued and closing eyes toward end of session.        Exercises Other Exercises Other Exercises: attempted supine ankle pumps but poor command following    General Comments General comments (skin integrity, edema, etc.): HR WFL, BP somewhat elevated 160's/90's initially in supine then 150's/90's sitting on BSC; no dizziness reported.      Pertinent Vitals/Pain Pain Assessment Pain Assessment: Faces Faces Pain Scale: Hurts a little bit Pain Location: possible discomfort with hygiene/peri care Pain Descriptors / Indicators: Grimacing Pain Intervention(s): Monitored during session, Repositioned (unable to visualize skin breakdown but difficult to assess due to need to assist pt with balance while standing.)     PT Goals (current goals can now be found in the care plan section) Acute Rehab PT Goals Patient Stated Goal: not stated PT Goal Formulation: With patient Time For Goal Achievement: 07/09/22 Progress towards PT goals: Progressing toward goals    Frequency    Min 1X/week      PT Plan Current plan remains appropriate    Co-evaluation PT/OT/SLP Co-Evaluation/Treatment: Yes Reason for Co-Treatment: Necessary to address cognition/behavior during functional activity;Complexity of the patient's impairments (multi-system involvement);For patient/therapist safety;To address functional/ADL transfers PT goals addressed during session: Mobility/safety with mobility;Balance;Proper use of DME        AM-PAC PT "6 Clicks" Mobility   Outcome  Measure  Help needed turning from your back to your side while  in a flat bed without using bedrails?: A Little Help needed moving from lying on your back to sitting on the side of a flat bed without using bedrails?: A Lot (w/o rails) Help needed moving to and from a bed to a chair (including a wheelchair)?: A Lot Help needed standing up from a chair using your arms (e.g., wheelchair or bedside chair)?: Total (+2 maxA) Help needed to walk in hospital room?: Total (+2 maxA) Help needed climbing 3-5 steps with a railing? : Total 6 Click Score: 10    End of Session Equipment Utilized During Treatment: Gait belt Activity Tolerance: Patient limited by fatigue;Treatment limited secondary to medical complications (Comment);Other (comment);Patient tolerated treatment well (AMS and incoordination limiting mobility progression, but pt with much improved tolerance from previous session.) Patient left: in chair;with call bell/phone within reach;with chair alarm set;Other (comment) (in recliner, RN notified to check on him as he may not tolerate chair long due to fatigue and AMS.) Nurse Communication: Mobility status;Precautions;Other (comment) (+2 assist for all pivot transfers, pt had bowel incontinence in bed and probably needs bed bath (therapists assisted him with hygiene), pt needs seizure pads over his bed rails.) PT Visit Diagnosis: Other abnormalities of gait and mobility (R26.89);Muscle weakness (generalized) (M62.81);Other symptoms and signs involving the nervous system (R29.898)     Time: 2542-7062 PT Time Calculation (min) (ACUTE ONLY): 36 min  Charges:  $Therapeutic Activity: 8-22 mins                     Eryck Negron P., PTA Acute Rehabilitation Services Secure Chat Preferred 9a-5:30pm Office: [phone]    Dorathy Kinsman [facility] 07/02/2022, 12:02 PM

## 2022-07-02 NOTE — Progress Notes (Signed)
LTM maint complete - no skin breakdown . Several leads replaced as pt had taken them off. [facility] monitored

## 2022-07-02 NOTE — Progress Notes (Signed)
PROGRESS NOTE    Kyle [name]  WUJ:[id number] DOB: February 04, 1956 DOA: 06/20/2022 PCP: No primary care provider on file.   Brief Narrative: 67 year old married male, independent, medical history significant for type II DM, HTN, HLD, PAD, recently seen in the ED on 05/19/2022 for complaints of visual disturbance x 2 days, unremarkable neurological exam/CT head/MRI brain, seen by neurology who recommended outpatient follow-up, also had episode of SVT with plans for outpatient cardiology follow-up, presented again to the ED on 06/20/2022 with complaints of palpitations, lightheadedness, dizziness, presyncopal.  He was found to be in SVT with heart rate of 177 which self terminated.  In the ED, also noted to have seizure-like activity for which neurology consulted.      Assessment & Plan:   Principal Problem:   Focal seizures (HCC) Active Problems:   Hypertension   Diabetes (HCC)   Hyperlipidemia   PAD (peripheral artery disease) (HCC)   Seizure (HCC)   Migraines   SVT (supraventricular tachycardia)   Visual disturbances   Statin intolerance  Focal seizures: Noted to have seizure-like activity in the emergency department.  Apparently ongoing since April.  PCP started Topamax 4/24 without improvement.   MRI brain from 06/29/2022 septal abnormality reflecting affecting the left occipital lobe with mildly heterogenous diffusion and FLAIR.  Favor nonketotic hyperglycemic occipital seizure.  No evidence of intracranial mets.  Indeterminate sclerotic lesion of the C3 vertebral body. MRI brain from 4/3 showed small curvilinear focus of susceptibility artifact involving the left occipital lobe likely a small focus of chronic hemosiderin staining.  Neurology was consulted.  Patient placed on continuous EEG. Currently patient is on the following medications: Clonazepam, lacosamide, Keppra, Topamax, perampanel  Clonazepam dose decreased to twice daily on 06/30/2022 by neurology Keppra dose decreased to  1000 mg twice a day by neurology on 06/30/2022 to minimize sedation. 5/15- per neurology patient has not had a seizure in the last 24 hours that they can see from the continuous EEG recording. 07/02/2022 no further seizures Keppra dose has been decreased further to 500 mg twice daily.   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   Patient underwent LP.  Traumatic tap was noted.  CT head did not show any acute findings.    Neurology was considering giving high-dose steroids if his seizures were to continue. Elevated ammonia level noted although clinically he does not appear to have hepatic encephalopathy.   Given a trial of lactulose.  Ammonia level noted to be normal .   Paroxysmal SVT/AVNRT Admission EKG showed SVT at 170.     TSH normal at 2.14.   Echocardiogram shows LVEF of 55 to 60%.  No regional wall motion abnormalities.  Grade 1 diastolic dysfunction was noted.  RV systolic function was normal.  No significant valvular abnormalities. Patient was seen by cardiology.  Patient was started on metoprolol.   On the morning of 5/10 patient again noted to be tachycardic.  EKG initially suggested atrial flutter.   Patient seen by cardiology.  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  Patient converted to sinus rhythm after he was given his a.m. dose of metoprolol. Cardiology signed off 06/28/2022.  Their recommendations are to continue amiodarone 200 twice daily now and then changed to 200 daily starting 07/09/2022.  Continue aspirin gemfibrozil and metoprolol.  Acute kidney injury, resolved  Creatinine in Care Everywhere 0.95 in January.  Presented with creatinine of 1.62.  Lisinopril held.  Patient was given IV fluids.  Improvement in renal function noted.   Consider resuming lisinopril and Aldactone at time of discharge.   Right kidney mass concerning for RCC/indeterminate hypodense lesion in the left kidney-follow-up  with urology after discharge.    Constipation-on lactulose   Poorly controlled type II DM with hyperglycemia: A1c 11.6.  Home metformin and glipizide on hold.  Currently on glargine and SSI.with him not eating decreased the dos eof semiglee on 5/16  CBG (last 3)  Recent Labs    07/01/22 1703 07/01/22 2104 07/02/22 0755  GLUCAP 152* 131* 143*     Essential hypertension/hyperaldosteronism/hypokalemia Home hydralazine, lisinopril and spironolactone on hold.   Currently on metoprolol. Increase metoprolol to 100 mg daily. Continue amiodarone loading    Hyperlipidemia/hypertriglyceridemia Continue gemfibrozil.  Statin intolerant.  Per cardiology, recommend lipid clinic evaluation and they will arrange follow-up   PAD: Continue aspirin and gemfibrozil.   Normocytic anemia: Likely dilutional drop in hemoglobin which is stable.  No evidence of overt bleeding.    Estimated body mass index is 25.08 kg/m as calculated from the following:   Height as of this encounter: 5\' 10"  (1.778 m).   Weight as of this encounter: 79.3 kg.  DVT prophylaxis: Lovenox  code Status: Full code  family Communication: wife Disposition Plan:  Status is: Inpatient Remains inpatient appropriate because: seizures   Consultants:  neuro  Procedures: eeg Antimicrobials: none Subjective: Patient resting in bed more awake today staff reported he ate breakfast will have physical therapy see him today  objective: Vitals:   07/01/22 1705 07/01/22 2013 07/02/22 0331 07/02/22 0752  BP: 133/76 120/74 (!) 153/81 (!) 143/72  Pulse: 69 63 63 66  Resp: 17 (!) 22 13 17   Temp: 97.9 F (36.6 C) 97.6 F (36.4 C) 98.7 F (37.1 C) (!) 97.4 F (36.3 C)  TempSrc: Oral Oral Oral Axillary  SpO2:      Weight:      Height:        Intake/Output Summary (Last 24 hours) at 07/02/2022 1007 Last data filed at 07/02/2022 0400 Gross per 24 hour  Intake 1946.99 ml  Output 1100 ml  Net 846.99 ml    Filed Weights    06/20/22 2210 06/21/22 0320  Weight: 79.4 kg 79.3 kg    Examination:  General exam:hard to arouse snoring  Respiratory system: Clear to auscultation. Respiratory effort normal. Cardiovascular system: S1 & S2 heard, RRR. No JVD, murmurs, rubs, gallops or clicks. No pedal edema. Gastrointestinal system: Abdomen is nondistended, soft and nontender. No organomegaly or masses felt. Normal bowel sounds heard. Central nervous system: hard to arouse  Data Reviewed: I have personally reviewed following labs and imaging studies  CBC: Recent Labs  Lab 06/27/22 0234 06/28/22 0254 06/29/22 0245 06/30/22 0257 07/02/22 0356  WBC 9.3 8.9 11.6* 10.4 7.9  HGB 11.4* 11.1* 13.1 13.0 11.8*  HCT 33.1* 32.7* 38.1* 37.4* 33.7*  MCV 90.2 89.1 89.0 89.3 87.5  PLT 282 301 333 321 278    Basic Metabolic Panel: Recent Labs  Lab 06/26/22 0158 06/27/22 0234 06/28/22 0254 06/29/22 0245 06/30/22 0257 07/02/22 0356  NA 138 139 134* 143 141 137  K 3.6 3.5 3.5 3.8 3.5 3.1*  CL 111 108 110 112* 114* 111  CO2 17* 19* 18* 18* 17* 17*  GLUCOSE 218* 187* 221* 193* 208* 161*  BUN 28* 32* 29* 21 17 16   CREATININE 0.88 1.00 1.05 0.98 1.00 0.94  CALCIUM 9.2 9.5 9.1 9.8 9.6 9.1  MG 2.1  --   --  2.0 2.0  --     GFR: Estimated Creatinine Clearance: 77.7 mL/min (by C-G formula based on SCr of 0.94 mg/dL). Liver Function Tests: Recent Labs  Lab 07/02/22 0356  AST 20  ALT 26  ALKPHOS 94  BILITOT 0.7  PROT 6.3*  ALBUMIN 3.2*   No results for input(s): "LIPASE", "AMYLASE" in the last 168 hours. Recent Labs  Lab 06/27/22 1246 06/29/22 0245  AMMONIA 59* 29    Coagulation Profile: No results for input(s): "INR", "PROTIME" in the last 168 hours. Cardiac Enzymes: No results for input(s): "CKTOTAL", "CKMB", "CKMBINDEX", "TROPONINI" in the last 168 hours. BNP (last 3 results) No results for input(s): "PROBNP" in the last 8760 hours. HbA1C: No results for input(s): "HGBA1C" in the last 72  hours. CBG: Recent Labs  Lab 07/01/22 0835 07/01/22 1139 07/01/22 1703 07/01/22 2104 07/02/22 0755  GLUCAP 120* 284* 152* 131* 143*    Lipid Profile: No results for input(s): "CHOL", "HDL", "LDLCALC", "TRIG", "CHOLHDL", "LDLDIRECT" in the last 72 hours. Thyroid Function Tests: No results for input(s): "TSH", "T4TOTAL", "FREET4", "T3FREE", "THYROIDAB" in the last 72 hours. Anemia Panel: No results for input(s): "VITAMINB12", "FOLATE", "FERRITIN", "TIBC", "IRON", "RETICCTPCT" in the last 72 hours. Sepsis Labs: No results for input(s): "PROCALCITON", "LATICACIDVEN" in the last 168 hours.  Recent Results (from the past 240 hour(s))  CSF culture w Gram Stain     Status: None (Preliminary result)   Collection Time: 06/28/22  4:00 PM   Specimen: PATH Cytology CSF; Cerebrospinal Fluid  Result Value Ref Range Status   Specimen Description CSF  Final   Special Requests NONE  Final   Gram Stain NO WBC SEEN NO ORGANISMS SEEN CYTOSPIN SMEAR   Final   Culture   Final    NO GROWTH 3 DAYS Performed at [facility], [address], [place], [place] [address]    Report Status PENDING  Incomplete         Radiology Studies: No results found.      Scheduled Meds:  amiodarone  200 mg Oral BID   [START ON 07/09/2022] amiodarone  200 mg Oral Daily   aspirin EC  81 mg Oral Daily   clonazepam  0.25 mg Oral BID   enoxaparin (LOVENOX) injection  40 mg Subcutaneous Q24H   gemfibrozil  600 mg Oral BID AC   insulin aspart  0-15 Units Subcutaneous TID WC   insulin aspart  0-5 Units Subcutaneous QHS   insulin aspart  4 Units Subcutaneous TID WC   insulin glargine-yfgn  12 Units Subcutaneous Daily   lacosamide  200 mg Oral BID   lactulose  20 g Oral TID   levETIRAcetam  500 mg Oral BID   metoprolol succinate  100 mg Oral Daily   perampanel  4 mg Oral QHS   potassium chloride  40 mEq Oral Once   senna-docusate  2 tablet Oral BID   topiramate  200 mg Oral BID   Continuous  Infusions:  sodium chloride 75 mL/hr at 07/02/22 0734     LOS: 11 days    Time spent: 38 min  Alwyn Ren, MD 07/02/2022, 10:07 AM

## 2022-07-03 DIAGNOSIS — R569 Unspecified convulsions: Secondary | ICD-10-CM | POA: Diagnosis not present

## 2022-07-03 LAB — BASIC METABOLIC PANEL
Anion gap: 9 (ref 5–15)
BUN: 14 mg/dL (ref 8–23)
CO2: 16 mmol/L — ABNORMAL LOW (ref 22–32)
Calcium: 8.9 mg/dL (ref 8.9–10.3)
Chloride: 115 mmol/L — ABNORMAL HIGH (ref 98–111)
Creatinine, Ser: 0.93 mg/dL (ref 0.61–1.24)
GFR, Estimated: >60 mL/min (ref >60)
Glucose, Bld: 100 mg/dL — ABNORMAL HIGH (ref 70–99)
Potassium: 2.9 mmol/L — ABNORMAL LOW (ref 3.5–5.1)
Sodium: 140 mmol/L (ref 135–145)

## 2022-07-03 LAB — GLUCOSE, CAPILLARY
Glucose-Capillary: 121 mg/dL — ABNORMAL HIGH (ref 70–99)
Glucose-Capillary: 97 mg/dL (ref 70–99)
Glucose-Capillary: 184 mg/dL — ABNORMAL HIGH (ref 70–99)
Glucose-Capillary: 145 mg/dL — ABNORMAL HIGH (ref 70–99)

## 2022-07-03 MED ORDER — CLONAZEPAM 0.125 MG PO TBDP
0.1250 mg | ORAL_TABLET | Freq: Two times a day (BID) | ORAL | Status: DC
  Administered 2022-07-03 – 2022-07-05 (×4): 0.125 mg via ORAL
  Filled 2022-07-03 (×4): qty 1

## 2022-07-03 MED ORDER — POTASSIUM CHLORIDE CRYS ER 20 MEQ PO TBCR
40.0000 meq | EXTENDED_RELEASE_TABLET | Freq: Three times a day (TID) | ORAL | Status: AC
  Administered 2022-07-03 – 2022-07-04 (×4): 40 meq via ORAL
  Filled 2022-07-03 (×4): qty 2

## 2022-07-03 MED ORDER — POTASSIUM CHLORIDE 10 MEQ/100ML IV SOLN
10.0000 meq | INTRAVENOUS | Status: AC
  Administered 2022-07-03 (×6): 10 meq via INTRAVENOUS
  Filled 2022-07-03: qty 100

## 2022-07-03 NOTE — Progress Notes (Signed)
PROGRESS NOTE    Kewon Roering  OZH:[id number] DOB: May 06, 1955 DOA: 06/20/2022 PCP: No primary care provider on file.   Brief Narrative: 67 year old married male, independent, medical history significant for type II DM, HTN, HLD, PAD, recently seen in the ED on 05/19/2022 for complaints of visual disturbance x 2 days, unremarkable neurological exam/CT head/MRI brain, seen by neurology who recommended outpatient follow-up, also had episode of SVT with plans for outpatient cardiology follow-up, presented again to the ED on 06/20/2022 with complaints of palpitations, lightheadedness, dizziness, presyncopal.  He was found to be in SVT with heart rate of 177 which self terminated.  In the ED, also noted to have seizure-like activity for which neurology consulted.      Assessment & Plan:   Principal Problem:   Focal seizures (HCC) Active Problems:   Hypertension   Diabetes (HCC)   Hyperlipidemia   PAD (peripheral artery disease) (HCC)   Seizure (HCC)   Migraines   SVT (supraventricular tachycardia)   Visual disturbances   Statin intolerance  Focal seizures: Noted to have seizure-like activity in the emergency department.  Apparently ongoing since April.  PCP started Topamax 4/24 without improvement.   MRI brain from 06/29/2022 septal abnormality reflecting affecting the left occipital lobe with mildly heterogenous diffusion and FLAIR.  Favor nonketotic hyperglycemic occipital seizure.  No evidence of intracranial mets.  Indeterminate sclerotic lesion of the C3 vertebral body. MRI brain from 4/3 showed small curvilinear focus of susceptibility artifact involving the left occipital lobe likely a small focus of chronic hemosiderin staining.  Neurology was consulted.  Patient placed on continuous EEG. Currently patient is on the following medications: Clonazepam, lacosamide, Keppra, Topamax, perampanel  Clonazepam dose decreased to twice daily on 06/30/2022 by neurology Keppra dose decreased to  1000 mg twice a day by neurology on 06/30/2022 to minimize sedation. 5/15- per neurology patient has not had a seizure in the last 24 hours that they can see from the continuous EEG recording. 07/02/2022 no further seizures Keppra dose has been decreased further to 500 mg twice daily.   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   Patient underwent LP.  Traumatic tap was noted.  CT head did not show any acute findings.  07/03/2022 decrease klonopin to 0.125 twice daily  Neurology was considering giving high-dose steroids if his seizures were to continue. Elevated ammonia level noted although clinically he does not appear to have hepatic encephalopathy.   Given a trial of lactulose.  Ammonia level noted to be normal .   Paroxysmal SVT/AVNRT Admission EKG showed SVT at 170.     TSH normal at 2.14.   Echocardiogram shows LVEF of 55 to 60%.  No regional wall motion abnormalities.  Grade 1 diastolic dysfunction was noted.  RV systolic function was normal.  No significant valvular abnormalities. Patient was seen by cardiology.  Patient was started on metoprolol.   On the morning of 5/10 patient again noted to be tachycardic.  EKG initially suggested atrial flutter.   Patient seen by cardiology.  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  Patient converted to sinus rhythm after he was given his a.m. dose of metoprolol. Cardiology signed off 06/28/2022.  Their recommendations are to continue amiodarone 200 twice daily now and then changed to 200 daily starting 07/09/2022.  Continue aspirin gemfibrozil and metoprolol.  Acute kidney injury, resolved  Creatinine in Care Everywhere 0.95 in January.  Presented with  creatinine of 1.62.  Lisinopril held.   Patient was given IV fluids.  Improvement in renal function noted.   Consider resuming lisinopril and Aldactone at time of discharge.   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    Constipation-on lactulose   Poorly controlled type II DM with hyperglycemia: A1c 11.6.  Home metformin and glipizide on hold.  Currently on glargine and SSI.with him not eating decreased the dos eof semiglee on 5/16  CBG (last 3)  Recent Labs    07/02/22 2204 07/03/22 0720 07/03/22 1143  GLUCAP 130* 121* 97     Essential hypertension/hyperaldosteronism/hypokalemia Home hydralazine, lisinopril and spironolactone on hold.   Currently on metoprolol. Increase metoprolol to 100 mg daily. Continue amiodarone loading    Hyperlipidemia/hypertriglyceridemia Continue gemfibrozil.  Statin intolerant.  Per cardiology, recommend lipid clinic evaluation and they will arrange follow-up   PAD: Continue aspirin and gemfibrozil.   Normocytic anemia: Likely dilutional drop in hemoglobin which is stable.  No evidence of overt bleeding.    Estimated body mass index is 25.08 kg/m as calculated from the following:   Height as of this encounter: 5\' 10"  (1.778 m).   Weight as of this encounter: 79.3 kg.  DVT prophylaxis: Lovenox  code Status: Full code  family Communication: Patient's wife and sister  disposition Plan:  Status is: Inpatient Remains inpatient appropriate because: seizures   Consultants:  neuro  Procedures: eeg Antimicrobials: none Subjective:  Patient was awake when I saw him earlier he was able to answer all my questions appropriately and followed commands when I spoke with the family after about 10:00 they said he got his morning medications and went right back to sleep.  He had some breakfast he did not eat 100% but he did have some breakfast and Ensure prior to taking the medications.  objective: Vitals:   07/02/22 1709 07/02/22 2020 07/03/22 0340 07/03/22 0724  BP:  (!) 162/77 (!) 195/79 (!) 173/106  Pulse:    61  Resp:  12 15 13   Temp: 97.9 F (36.6 C)   (!) 97.4  F (36.3 C)  TempSrc: Oral   Oral  SpO2:    95%  Weight:      Height:        Intake/Output Summary (Last 24 hours) at 07/03/2022 1308 Last data filed at 07/03/2022 0158 Gross per 24 hour  Intake 892.49 ml  Output --  Net 892.49 ml    Filed Weights   06/20/22 2210 06/21/22 0320  Weight: 79.4 kg 79.3 kg    Examination:  General exam: Awake follows commands  respiratory system: Clear to auscultation. Respiratory effort normal. Cardiovascular system: S1 & S2 heard, RRR. No JVD, murmurs, rubs, gallops or clicks. No pedal edema. Gastrointestinal system: Abdomen is nondistended, soft and nontender. No organomegaly or masses felt. Normal bowel sounds heard. Central nervous system: Awake follows commands answers questions appropriately Data Reviewed: I have personally reviewed following labs and imaging studies  CBC: Recent Labs  Lab 06/27/22 0234 06/28/22 0254 06/29/22 0245 06/30/22 0257 07/02/22 0356  WBC 9.3 8.9 11.6* 10.4 7.9  HGB 11.4* 11.1* 13.1 13.0 11.8*  HCT 33.1* 32.7* 38.1* 37.4* 33.7*  MCV 90.2 89.1 89.0 89.3 87.5  PLT 282 301 333 321 278    Basic Metabolic Panel: Recent Labs  Lab 06/28/22 0254 06/29/22 0245 06/30/22 0257 07/02/22 0356 07/02/22 0652 07/03/22 1131  NA 134* 143 141 137  --  140  K 3.5 3.8 3.5 3.1*  --  2.9*  CL 110 112* 114* 111  --  115*  CO2 18* 18* 17* 17*  --  16*  GLUCOSE 221* 193* 208* 161*  --  100*  BUN 29* 21 17 16   --  14  CREATININE 1.05 0.98 1.00 0.94  --  0.93  CALCIUM 9.1 9.8 9.6 9.1  --  8.9  MG  --  2.0 2.0  --  1.9  --     GFR: Estimated Creatinine Clearance: 78.5 mL/min (by C-G formula based on SCr of 0.93 mg/dL). Liver Function Tests: Recent Labs  Lab 07/02/22 0356  AST 20  ALT 26  ALKPHOS 94  BILITOT 0.7  PROT 6.3*  ALBUMIN 3.2*    No results for input(s): "LIPASE", "AMYLASE" in the last 168 hours. Recent Labs  Lab 06/27/22 1246 06/29/22 0245  AMMONIA 59* 29    Coagulation Profile: No results  for input(s): "INR", "PROTIME" in the last 168 hours. Cardiac Enzymes: No results for input(s): "CKTOTAL", "CKMB", "CKMBINDEX", "TROPONINI" in the last 168 hours. BNP (last 3 results) No results for input(s): "PROBNP" in the last 8760 hours. HbA1C: No results for input(s): "HGBA1C" in the last 72 hours. CBG: Recent Labs  Lab 07/02/22 1206 07/02/22 1706 07/02/22 2204 07/03/22 0720 07/03/22 1143  GLUCAP 190* 137* 130* 121* 97    Lipid Profile: No results for input(s): "CHOL", "HDL", "LDLCALC", "TRIG", "CHOLHDL", "LDLDIRECT" in the last 72 hours. Thyroid Function Tests: No results for input(s): "TSH", "T4TOTAL", "FREET4", "T3FREE", "THYROIDAB" in the last 72 hours. Anemia Panel: No results for input(s): "VITAMINB12", "FOLATE", "FERRITIN", "TIBC", "IRON", "RETICCTPCT" in the last 72 hours. Sepsis Labs: No results for input(s): "PROCALCITON", "LATICACIDVEN" in the last 168 hours.  Recent Results (from the past 240 hour(s))  CSF culture w Gram Stain     Status: None   Collection Time: 06/28/22  4:00 PM   Specimen: PATH Cytology CSF; Cerebrospinal Fluid  Result Value Ref Range Status   Specimen Description CSF  Final   Special Requests NONE  Final   Gram Stain NO WBC SEEN NO ORGANISMS SEEN CYTOSPIN SMEAR   Final   Culture   Final    NO GROWTH 3 DAYS Performed at [facility], [address], [place], [place] [address]    Report Status 07/02/2022 FINAL  Final         Radiology Studies: No results found.      Scheduled Meds:  amiodarone  200 mg Oral BID   [START ON 07/09/2022] amiodarone  200 mg Oral Daily   aspirin EC  81 mg Oral Daily   clonazepam  0.125 mg Oral BID   enoxaparin (LOVENOX) injection  40 mg Subcutaneous Q24H   gemfibrozil  600 mg Oral BID AC   insulin aspart  0-15 Units Subcutaneous TID WC   insulin aspart  0-5 Units Subcutaneous QHS   insulin aspart  4 Units Subcutaneous TID WC   insulin glargine-yfgn  12 Units Subcutaneous Daily    lacosamide  200 mg Oral BID   lactulose  20 g Oral TID   levETIRAcetam  500 mg Oral BID   metoprolol succinate  100 mg Oral Daily   perampanel  4 mg Oral QHS   senna-docusate  2 tablet Oral BID   topiramate  200 mg Oral BID   Continuous Infusions:  sodium chloride 75 mL/hr at 07/03/22 0947     LOS: 12 days    Time spent:  38 min  Alwyn Ren, MD 07/03/2022, 1:08 PM

## 2022-07-04 DIAGNOSIS — R569 Unspecified convulsions: Secondary | ICD-10-CM | POA: Diagnosis not present

## 2022-07-04 LAB — BASIC METABOLIC PANEL
Anion gap: 10 (ref 5–15)
BUN: 12 mg/dL (ref 8–23)
CO2: 14 mmol/L — ABNORMAL LOW (ref 22–32)
Calcium: 9.2 mg/dL (ref 8.9–10.3)
Chloride: 112 mmol/L — ABNORMAL HIGH (ref 98–111)
Creatinine, Ser: 0.91 mg/dL (ref 0.61–1.24)
GFR, Estimated: >60 mL/min (ref >60)
Glucose, Bld: 145 mg/dL — ABNORMAL HIGH (ref 70–99)
Potassium: 3.6 mmol/L (ref 3.5–5.1)
Sodium: 136 mmol/L (ref 135–145)

## 2022-07-04 LAB — GLUCOSE, CAPILLARY
Glucose-Capillary: 194 mg/dL — ABNORMAL HIGH (ref 70–99)
Glucose-Capillary: 124 mg/dL — ABNORMAL HIGH (ref 70–99)
Glucose-Capillary: 113 mg/dL — ABNORMAL HIGH (ref 70–99)
Glucose-Capillary: 224 mg/dL — ABNORMAL HIGH (ref 70–99)

## 2022-07-04 LAB — AMMONIA: Ammonia: 59 umol/L — ABNORMAL HIGH (ref 9–35)

## 2022-07-04 MED ORDER — LACTULOSE 10 GM/15ML PO SOLN: 10.0000 g | Freq: Every day | ORAL | Status: DC

## 2022-07-04 NOTE — Progress Notes (Signed)
PROGRESS NOTE    Kyle [name]  ZHY:[id number] DOB: Apr 01, 1955 DOA: 06/20/2022 PCP: No primary care provider on file.   Brief Narrative: 67 year old married male, independent, medical history significant for type II DM, HTN, HLD, PAD, recently seen in the ED on 05/19/2022 for complaints of visual disturbance x 2 days, unremarkable neurological exam/CT head/MRI brain, seen by neurology who recommended outpatient follow-up, also had episode of SVT with plans for outpatient cardiology follow-up, presented again to the ED on 06/20/2022 with complaints of palpitations, lightheadedness, dizziness, presyncopal.  He was found to be in SVT with heart rate of 177 which self terminated.  In the ED, also noted to have seizure-like activity for which neurology consulted.      Assessment & Plan:   Principal Problem:   Focal seizures (HCC) Active Problems:   Hypertension   Diabetes (HCC)   Hyperlipidemia   PAD (peripheral artery disease) (HCC)   Seizure (HCC)   Migraines   SVT (supraventricular tachycardia)   Visual disturbances   Statin intolerance  Focal seizures: Noted to have seizure-like activity in the emergency department.  Apparently ongoing since April.  PCP started Topamax 4/24 without improvement.   MRI brain from 06/29/2022 septal abnormality reflecting affecting the left occipital lobe with mildly heterogenous diffusion and FLAIR.  Favor nonketotic hyperglycemic occipital seizure.  No evidence of intracranial mets.  Indeterminate sclerotic lesion of the C3 vertebral body. MRI brain from 4/3 showed small curvilinear focus of susceptibility artifact involving the left occipital lobe likely a small focus of chronic hemosiderin staining.  Neurology was consulted.  Patient placed on continuous EEG. Currently patient is on the following medications: Clonazepam, lacosamide, Keppra, Topamax, perampanel  Clonazepam dose decreased to twice daily on 06/30/2022 by neurology Keppra dose decreased to  1000 mg twice a day by neurology on 06/30/2022 to minimize sedation. 5/15- per neurology patient has not had a seizure in the last 24 hours that they can see from the continuous EEG recording. 07/02/2022 no further seizures Keppra dose has been decreased further to 500 mg twice daily.   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   Patient underwent LP.  Traumatic tap was noted.  CT head did not show any acute findings.  07/03/2022 decrease klonopin to 0.125 twice daily  Neurology was considering giving high-dose steroids if his seizures were to continue. Elevated ammonia level noted although clinically he does not appear to have hepatic encephalopathy.   Given a trial of lactulose.  Ammonia level noted to be normal .   Paroxysmal SVT/AVNRT Admission EKG showed SVT at 170.     TSH normal at 2.14.   Echocardiogram shows LVEF of 55 to 60%.  No regional wall motion abnormalities.  Grade 1 diastolic dysfunction was noted.  RV systolic function was normal.  No significant valvular abnormalities. Patient was seen by cardiology.  Patient was started on metoprolol.   On the morning of 5/10 patient again noted to be tachycardic.  EKG initially suggested atrial flutter.   Patient seen by cardiology.  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  Patient converted to sinus rhythm after he was given his a.m. dose of metoprolol. Cardiology signed off 06/28/2022.  Their recommendations are to continue amiodarone 200 twice daily now and then changed to 200 daily starting 07/09/2022.  Continue aspirin gemfibrozil and metoprolol.  Acute kidney injury, resolved  Creatinine in Care Everywhere 0.95 in January.  Presented with  creatinine of 1.62.  Lisinopril held.   Patient was given IV fluids.  Improvement in renal function noted.   Consider resuming lisinopril and Aldactone at time of discharge.   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    Poorly controlled type II DM with hyperglycemia: A1c 11.6.  Home metformin and glipizide on hold.  Currently on glargine and SSI.with him not eating decreased the dos eof semiglee on 5/16  CBG (last 3)  Recent Labs    07/03/22 1143 07/03/22 1631 07/03/22 2116  GLUCAP 97 184* 145*     Essential hypertension/hyperaldosteronism/hypokalemia Home hydralazine, lisinopril and spironolactone on hold.   Currently on metoprolol. Increase metoprolol to 100 mg daily. Continue amiodarone loading    Hyperlipidemia/hypertriglyceridemia Continue gemfibrozil.  Statin intolerant.  Per cardiology, recommend lipid clinic evaluation and they will arrange follow-up   PAD: Continue aspirin and gemfibrozil.   Normocytic anemia: Likely dilutional drop in hemoglobin which is stable.  No evidence of overt bleeding.   Elevated ammonia level unclear reason decrease lactulose to once a day and check levels today.  Estimated body mass index is 25.08 kg/m as calculated from the following:   Height as of this encounter: 5\' 10"  (1.778 m).   Weight as of this encounter: 79.3 kg.  DVT prophylaxis: Lovenox  code Status: Full code  family Communication: Patient's wife and sister  disposition Plan:  Status is: Inpatient Remains inpatient appropriate because: seizures   Consultants:  neuro  Procedures: eeg Antimicrobials: none Subjective: Staff reports that he had a restless night was pulling on lines did not sleep well so more tired today but he is awake and alert and able to answer questions appropriately Had frequent loose BMs overnight on lactulose 3 times a day objective: Vitals:   07/03/22 2000 07/03/22 2054 07/04/22 0352 07/04/22 0823  BP:   (!) 187/78 (!) 162/93  Pulse: 60   70  Resp: 11 10 10 15   Temp: (!) 97.3 F (36.3 C)   98.1 F (36.7 C)  TempSrc: Oral   Oral  SpO2: 95%     Weight:       Height:        Intake/Output Summary (Last 24 hours) at 07/04/2022 1007 Last data filed at 07/04/2022 0352 Gross per 24 hour  Intake 2059.61 ml  Output 3100 ml  Net -1040.39 ml    Filed Weights   06/20/22 2210 06/21/22 0320  Weight: 79.4 kg 79.3 kg    Examination:  General exam: Awake follows commands  respiratory system: Clear to auscultation. Respiratory effort normal. Cardiovascular system: S1 & S2 heard, RRR. No JVD, murmurs, rubs, gallops or clicks. No pedal edema. Gastrointestinal system: Abdomen is nondistended, soft and nontender. No organomegaly or masses felt. Normal bowel sounds heard. Central nervous system: Awake follows commands answers questions appropriately Data Reviewed: I have personally reviewed following labs and imaging studies  CBC: Recent Labs  Lab 06/28/22 0254 06/29/22 0245 06/30/22 0257 07/02/22 0356  WBC 8.9 11.6* 10.4 7.9  HGB 11.1* 13.1 13.0 11.8*  HCT 32.7* 38.1* 37.4* 33.7*  MCV 89.1 89.0 89.3 87.5  PLT 301 333 321 278    Basic Metabolic Panel: Recent Labs  Lab 06/29/22 0245 06/30/22 0257 07/02/22 0356 07/02/22 0652 07/03/22 1131 07/04/22 0236  NA 143 141 137  --  140 136  K 3.8 3.5 3.1*  --  2.9* 3.6  CL 112* 114* 111  --  115* 112*  CO2 18* 17* 17*  --  16* 14*  GLUCOSE 193* 208* 161*  --  100* 145*  BUN 21 17 16   --  14 12  CREATININE 0.98 1.00 0.94  --  0.93 0.91  CALCIUM 9.8 9.6 9.1  --  8.9 9.2  MG 2.0 2.0  --  1.9  --   --     GFR: Estimated Creatinine Clearance: 80.2 mL/min (by C-G formula based on SCr of 0.91 mg/dL). Liver Function Tests: Recent Labs  Lab 07/02/22 0356  AST 20  ALT 26  ALKPHOS 94  BILITOT 0.7  PROT 6.3*  ALBUMIN 3.2*    No results for input(s): "LIPASE", "AMYLASE" in the last 168 hours. Recent Labs  Lab 06/27/22 1246 06/29/22 0245  AMMONIA 59* 29    Coagulation Profile: No results for input(s): "INR", "PROTIME" in the last 168 hours. Cardiac Enzymes: No results for input(s):  "CKTOTAL", "CKMB", "CKMBINDEX", "TROPONINI" in the last 168 hours. BNP (last 3 results) No results for input(s): "PROBNP" in the last 8760 hours. HbA1C: No results for input(s): "HGBA1C" in the last 72 hours. CBG: Recent Labs  Lab 07/02/22 2204 07/03/22 0720 07/03/22 1143 07/03/22 1631 07/03/22 2116  GLUCAP 130* 121* 97 184* 145*    Lipid Profile: No results for input(s): "CHOL", "HDL", "LDLCALC", "TRIG", "CHOLHDL", "LDLDIRECT" in the last 72 hours. Thyroid Function Tests: No results for input(s): "TSH", "T4TOTAL", "FREET4", "T3FREE", "THYROIDAB" in the last 72 hours. Anemia Panel: No results for input(s): "VITAMINB12", "FOLATE", "FERRITIN", "TIBC", "IRON", "RETICCTPCT" in the last 72 hours. Sepsis Labs: No results for input(s): "PROCALCITON", "LATICACIDVEN" in the last 168 hours.  Recent Results (from the past 240 hour(s))  CSF culture w Gram Stain     Status: None   Collection Time: 06/28/22  4:00 PM   Specimen: PATH Cytology CSF; Cerebrospinal Fluid  Result Value Ref Range Status   Specimen Description CSF  Final   Special Requests NONE  Final   Gram Stain NO WBC SEEN NO ORGANISMS SEEN CYTOSPIN SMEAR   Final   Culture   Final    NO GROWTH 3 DAYS Performed at [facility], [address], [place], [place] [address]    Report Status 07/02/2022 FINAL  Final         Radiology Studies: No results found.      Scheduled Meds:  amiodarone  200 mg Oral BID   [START ON 07/09/2022] amiodarone  200 mg Oral Daily   aspirin EC  81 mg Oral Daily   clonazepam  0.125 mg Oral BID   enoxaparin (LOVENOX) injection  40 mg Subcutaneous Q24H   gemfibrozil  600 mg Oral BID AC   insulin aspart  0-15 Units Subcutaneous TID WC   insulin aspart  0-5 Units Subcutaneous QHS   insulin aspart  4 Units Subcutaneous TID WC   insulin glargine-yfgn  12 Units Subcutaneous Daily   lacosamide  200 mg Oral BID   lactulose  20 g Oral TID   levETIRAcetam  500 mg Oral BID    metoprolol succinate  100 mg Oral Daily   perampanel  4 mg Oral QHS   potassium chloride  40 mEq Oral TID   senna-docusate  2 tablet Oral BID   topiramate  200 mg Oral BID   Continuous Infusions:  sodium chloride 75 mL/hr at 07/04/22 0352     LOS: 13 days    Time spent: 38 min  Alwyn Ren, MD 07/04/2022, 10:07 AM

## 2022-07-05 ENCOUNTER — Inpatient Hospital Stay (HOSPITAL_COMMUNITY): Payer: Medicare Other

## 2022-07-05 DIAGNOSIS — R569 Unspecified convulsions: Secondary | ICD-10-CM | POA: Diagnosis not present

## 2022-07-05 DIAGNOSIS — D649 Anemia, unspecified: Secondary | ICD-10-CM | POA: Diagnosis not present

## 2022-07-05 DIAGNOSIS — I4719 Other supraventricular tachycardia: Secondary | ICD-10-CM | POA: Diagnosis not present

## 2022-07-05 LAB — COMPREHENSIVE METABOLIC PANEL
ALT: 38 U/L (ref 0–44)
AST: 32 U/L (ref 15–41)
Albumin: 3.5 g/dL (ref 3.5–5.0)
Alkaline Phosphatase: 99 U/L (ref 38–126)
Anion gap: 10 (ref 5–15)
BUN: 16 mg/dL (ref 8–23)
CO2: 17 mmol/L — ABNORMAL LOW (ref 22–32)
Calcium: 9.5 mg/dL (ref 8.9–10.3)
Chloride: 109 mmol/L (ref 98–111)
Creatinine, Ser: 1.02 mg/dL (ref 0.61–1.24)
GFR, Estimated: >60 mL/min (ref >60)
Glucose, Bld: 143 mg/dL — ABNORMAL HIGH (ref 70–99)
Potassium: 3.4 mmol/L — ABNORMAL LOW (ref 3.5–5.1)
Sodium: 136 mmol/L (ref 135–145)
Total Bilirubin: 0.8 mg/dL (ref 0.3–1.2)
Total Protein: 6.7 g/dL (ref 6.5–8.1)

## 2022-07-05 LAB — CBC
HCT: 38.3 % — ABNORMAL LOW (ref 39.0–52.0)
Hemoglobin: 13.2 g/dL (ref 13.0–17.0)
MCH: 30.1 pg (ref 26.0–34.0)
MCHC: 34.5 g/dL (ref 30.0–36.0)
MCV: 87.4 fL (ref 80.0–100.0)
Platelets: 347 10*3/uL (ref 150–400)
RBC: 4.38 MIL/uL (ref 4.22–5.81)
RDW: 14.6 % (ref 11.5–15.5)
WBC: 7.9 10*3/uL (ref 4.0–10.5)
nRBC: 0 % (ref 0.0–0.2)

## 2022-07-05 LAB — BLOOD GAS, ARTERIAL
Acid-base deficit: 7.5 mmol/L — ABNORMAL HIGH (ref 0.0–2.0)
Bicarbonate: 15.5 mmol/L — ABNORMAL LOW (ref 20.0–28.0)
Drawn by: 53547
O2 Saturation: 99.7 %
Patient temperature: 37.1
pCO2 arterial: 25 mmHg — ABNORMAL LOW (ref 32–48)
pH, Arterial: 7.4 (ref 7.35–7.45)
pO2, Arterial: 93 mmHg (ref 83–108)

## 2022-07-05 LAB — GLUCOSE, CAPILLARY
Glucose-Capillary: 161 mg/dL — ABNORMAL HIGH (ref 70–99)
Glucose-Capillary: 127 mg/dL — ABNORMAL HIGH (ref 70–99)
Glucose-Capillary: 140 mg/dL — ABNORMAL HIGH (ref 70–99)
Glucose-Capillary: 263 mg/dL — ABNORMAL HIGH (ref 70–99)

## 2022-07-05 LAB — MAGNESIUM: Magnesium: 2 mg/dL (ref 1.7–2.4)

## 2022-07-05 LAB — AMMONIA: Ammonia: 47 umol/L — ABNORMAL HIGH (ref 9–35)

## 2022-07-05 MED ORDER — POTASSIUM CHLORIDE CRYS ER 20 MEQ PO TBCR
40.0000 meq | EXTENDED_RELEASE_TABLET | Freq: Once | ORAL | Status: AC
  Administered 2022-07-05: 40 meq via ORAL
  Filled 2022-07-05: qty 2

## 2022-07-05 MED ORDER — TOPIRAMATE 25 MG PO TABS
150.0000 mg | ORAL_TABLET | Freq: Two times a day (BID) | ORAL | Status: DC
  Administered 2022-07-05 – 2022-07-09 (×8): 150 mg via ORAL
  Filled 2022-07-05 (×9): qty 2

## 2022-07-05 MED ORDER — CLONAZEPAM 0.125 MG PO TBDP: 0.1250 mg | ORAL_TABLET | Freq: Every day | ORAL | Status: DC

## 2022-07-05 MED ORDER — HYDRALAZINE HCL 25 MG PO TABS
25.0000 mg | ORAL_TABLET | Freq: Three times a day (TID) | ORAL | Status: DC
  Administered 2022-07-05 – 2022-07-09 (×13): 25 mg via ORAL
  Filled 2022-07-05 (×13): qty 1

## 2022-07-05 MED ORDER — CLONAZEPAM 0.125 MG PO TBDP
0.2500 mg | ORAL_TABLET | Freq: Two times a day (BID) | ORAL | Status: DC
  Administered 2022-07-05 – 2022-07-06 (×3): 0.25 mg via ORAL
  Filled 2022-07-05 (×3): qty 2

## 2022-07-05 NOTE — Progress Notes (Signed)
Re-hooked entire EEG. RN placed restraints

## 2022-07-05 NOTE — Progress Notes (Signed)
PT Cancellation Note  Patient Details Name: Kyle [name] MRN: [id number] DOB: 06/23/1955   Cancelled Treatment:    Reason Eval/Treat Not Completed: Patient at procedure or test/unavailable, having EEG placed upon arrival. Will continue to follow and progress as time/schedule allow and is medically appropriate.   [name], PT, DPT   Acute Rehabilitation Department Office [phone] Secure Chat Communication Preferred   [name] 07/05/2022, 1:42 PM

## 2022-07-05 NOTE — Progress Notes (Signed)
LTM EEG hooked up and running - no initial skin breakdown - push button tested - [facility] monitoring.  

## 2022-07-05 NOTE — Progress Notes (Signed)
[facility] night cross cover note:  I was notified by RN that this patient is agitated, pulling at their telemetry leads, peripheral IV, and eeg leads, with these behaviors refractory to attempts at verbal redirection.  In the setting of associated interference with ongoing medical treatment posing potential harm to themself, I have placed orders for soft bilateral wrist restraints.    Newton Pigg, DO Hospitalist

## 2022-07-05 NOTE — Progress Notes (Addendum)
Subjective: Called by Dr Ashley Royalty that patient continues to be excessively drowsy.  Wanted to make sure patient is not having recurrent seizures.  On my evaluation, patient was able to wake up and answer all questions appropriately but does seem to fall back asleep/wanting to keep his eyes closed  ROS: negative except above  Examination  Vital signs in last 24 hours: Temp:  [97.4 F (36.3 C)-98.7 F (37.1 C)] 98.7 F (37.1 C) (05/20 1203) Pulse Rate:  [56-65] 58 (05/20 1203) Resp:  [14-16] 14 (05/20 1203) BP: (93-166)/(63-94) 93/65 (05/20 1203) SpO2:  [95 %] 95 % (05/20 0356)  General: lying in bed, NAD Neuro: MS: Opens eyes to verbal stimulation, oriented,x3 follows commands, able to do simple math but poor attention span.  Also tends to keep eyes closed while answering questions CN: pupils equal and reactive,  EOMI, face symmetric, tongue midline, normal sensation over face, Motor: 4/5 strength in all 4 extremities with poor participation  Basic Metabolic Panel: Recent Labs  Lab 06/29/22 0245 06/30/22 0257 07/02/22 0356 07/02/22 0652 07/03/22 1131 07/04/22 0236 07/05/22 0341  NA 143 141 137  --  140 136 136  K 3.8 3.5 3.1*  --  2.9* 3.6 3.4*  CL 112* 114* 111  --  115* 112* 109  CO2 18* 17* 17*  --  16* 14* 17*  GLUCOSE 193* 208* 161*  --  100* 145* 143*  BUN 21 17 16   --  14 12 16   CREATININE 0.98 1.00 0.94  --  0.93 0.91 1.02  CALCIUM 9.8 9.6 9.1  --  8.9 9.2 9.5  MG 2.0 2.0  --  1.9  --   --   --     CBC: Recent Labs  Lab 06/29/22 0245 06/30/22 0257 07/02/22 0356 07/05/22 0341  WBC 11.6* 10.4 7.9 7.9  HGB 13.1 13.0 11.8* 13.2  HCT 38.1* 37.4* 33.7* 38.3*  MCV 89.0 89.3 87.5 87.4  PLT 333 321 278 347     Coagulation Studies: No results for input(s): "LABPROT", "INR" in the last 72 hours.  Imaging No new brain imaging overnight   ASSESSMENT AND PLAN: 67 yo M with 2 days of headache and right-sided vision changes, with blood sugars running in the 300s  to 400s. EEG showed seizures arising from left posterior quadrant.  CT chest abdomen pelvis showed  eterogeneously enhancing mass in the right kidney worrisome for renal cell carcinoma. Indeterminate hypodense lesion in the left kidney.  Had lumbar puncture on 07/15/2022, serum and CSF paraneoplastic panel sent to [facility]   New onset seizure Acute toxic encephalopathy, resolved - Likely lowered seizure threshold due to hyperglycemia versus paraneoplastic   Recommendations - Will repeat video eeg and if no seizures can stop keppra.  - Hyperammonemia could be secondary to topamax but hesitant to wean multiple meds at once as his seizures were very difficult to control. Will discuss starting Levocarnitine with Dr Ashley Royalty -Continue following Anti seizure meds for now 1.Keppra 500 mg twice daily 2. Clonazepam to 0.25 mg twice daily  3. Topiramate 200mg  BID 4. Perampanel to 4mg  QHS 5. Vimpat 200 mg BID -Will schedule follow-up with Dr. Teresa Coombs in 4 weeks, order placed.  May need to repeat MRI brain at that time to see if MRI changes of improved/resolved -Keppra was not very effective in controlling his seizures. Therefore, might be able to wean off completely during follow-up if patient remains seizure-free. Additionally, patient's seizures initially had clinical signs (gaze deviation, flashing lights).  However after starting antiseizure medication, no clinical signs were noted.  Therefore may benefit from ambulatory EEG to help guide weaning of antiseizure medications -Also needs referral to urology for for further evaluation of renal mass -Will check co-pay for intranasal Valtoco 20 mg (10 mg in each nostril) for seizure lasting more than 2 minutes.  If co-pay is really high/not covered by insurance, can take clonazepam 1 mg oral dissolving tablet for seizure lasting more than 2 minutes - continue seizure precautions - PRN IV versed for clinical seizure while inpatient -Discussed plan with Dr.  Ashley Royalty via secure chat    DDENDUM - No seizures do far,will dc keppra and reduce topamax to 150mg  BID   I have spent a total of  36 minutes with the patient reviewing hospital notes,  test results, labs and examining the patient as well as establishing an assessment and plan.  > 50% of time was spent in direct patient care.      Lindie Spruce Epilepsy [facility] For questions after 5pm please refer to AMION to reach the Neurologist on call

## 2022-07-05 NOTE — Progress Notes (Signed)
PROGRESS NOTE    Kyle [name]  ZOX:[id number] DOB: 1955-08-05 DOA: 06/20/2022 PCP: No primary care provider on file.   Brief Narrative: 67 year old married male, independent, medical history significant for type II DM, HTN, HLD, PAD, recently seen in the ED on 05/19/2022 for complaints of visual disturbance x 2 days, unremarkable neurological exam/CT head/MRI brain, seen by neurology who recommended outpatient follow-up, also had episode of SVT with plans for outpatient cardiology follow-up, presented again to the ED on 06/20/2022 with complaints of palpitations, lightheadedness, dizziness, presyncopal.  He was found to be in SVT with heart rate of 177 which self terminated.  In the ED, also noted to have seizure-like activity for which neurology consulted.      Assessment & Plan:   Principal Problem:   Focal seizures (HCC) Active Problems:   Hypertension   Diabetes (HCC)   Hyperlipidemia   PAD (peripheral artery disease) (HCC)   Seizure (HCC)   Migraines   SVT (supraventricular tachycardia)   Visual disturbances   Statin intolerance  Focal seizures: Noted to have seizure-like activity in the emergency department.  Apparently ongoing since April.  PCP started Topamax 4/24 without improvement.   MRI brain from 06/29/2022 septal abnormality reflecting affecting the left occipital lobe with mildly heterogenous diffusion and FLAIR.  Favor nonketotic hyperglycemic occipital seizure.  No evidence of intracranial mets.  Indeterminate sclerotic lesion of the C3 vertebral body. MRI brain from 4/3 showed small curvilinear focus of susceptibility artifact involving the left occipital lobe likely a small focus of chronic hemosiderin staining.  Neurology was consulted.  Patient placed on continuous EEG. Currently patient is on the following medications: Clonazepam, lacosamide, Keppra, Topamax, perampanel  Clonazepam dose decreased to twice daily on 06/30/2022 by neurology Keppra dose decreased to  1000 mg twice a day by neurology on 06/30/2022 to minimize sedation. 5/15- per neurology patient has not had a seizure in the last 24 hours that they can see from the continuous EEG recording. 07/02/2022 no further seizures Keppra dose has been decreased further to 500 mg twice daily.   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   Patient underwent LP.  Traumatic tap was noted.  CT head did not show any acute findings.   Patient has been somnolent discussed with neuro plan for EEG today.  Will not taper Klonopin.  Neurology was considering giving high-dose steroids if his seizures were to continue. Elevated ammonia level noted although clinically he does not appear to have hepatic encephalopathy.  Ammonia level is 47.  He had 3 bowel movements overnight and several bowel movements loose yesterday lactulose has been on hold.   Paroxysmal SVT/AVNRT Admission EKG showed SVT at 170.     TSH normal at 2.14.   Echocardiogram shows LVEF of 55 to 60%.  No regional wall motion abnormalities.  Grade 1 diastolic dysfunction was noted.  RV systolic function was normal.  No significant valvular abnormalities. Patient was seen by cardiology.  Patient was started on metoprolol.   On the morning of 5/10 patient again noted to be tachycardic.  EKG initially suggested atrial flutter.   Patient seen by cardiology.  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  Patient converted to sinus rhythm after he was given his a.m. dose of metoprolol. Cardiology signed off 06/28/2022.  Their recommendations are to continue amiodarone 200 twice daily now and then changed to 200 daily starting 07/09/2022.  Continue aspirin gemfibrozil  and metoprolol.  Acute kidney injury, resolved  Creatinine in Care Everywhere 0.95 in January.  Presented with creatinine of 1.62.  Lisinopril held.   Patient was given IV fluids.  Improvement in renal function noted.   Consider  resuming lisinopril and Aldactone at time of discharge.   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    Poorly controlled type II DM with hyperglycemia: A1c 11.6.  Home metformin and glipizide on hold.  Currently on glargine and SSI.with him not eating decreased the dos eof semiglee on 5/16  CBG (last 3)  Recent Labs    07/04/22 1609 07/04/22 2135 07/05/22 0806  GLUCAP 113* 224* 161*     Essential hypertension/hyperaldosteronism/hypokalemia Home lisinopril and spironolactone on hold.   Currently on metoprolol. Increase metoprolol to 100 mg daily. Continue amiodarone  Restarted hydralazine on 07/05/2022   Hyperlipidemia/hypertriglyceridemia Continue gemfibrozil.  Statin intolerant.  Per cardiology, recommend lipid clinic evaluation and they will arrange follow-up   PAD: Continue aspirin and gemfibrozil.   Normocytic anemia: Likely dilutional drop in hemoglobin which is stable.  No evidence of overt bleeding.   Elevated ammonia level he had profuse diarrhea so lactulose is on hold  Estimated body mass index is 25.08 kg/m as calculated from the following:   Height as of this encounter: 5\' 10"  (1.778 m).   Weight as of this encounter: 79.3 kg.  DVT prophylaxis: Lovenox  code Status: Full code  family Communication: Patient's wife and sister  disposition Plan:  Status is: Inpatient Remains inpatient appropriate because: seizures   Consultants:  neuro  Procedures: eeg Antimicrobials: none Subjective:  Very somnolent today Took few bites of egg and medications objective: Vitals:   07/04/22 1941 07/04/22 2000 07/05/22 0356 07/05/22 0903  BP: 133/63  (!) 142/82 (!) 166/94  Pulse: 65  (!) 58 64  Resp: 15 14    Temp: 98.2 F (36.8 C)  97.6 F (36.4 C)   TempSrc: Oral  Oral   SpO2:  95% 95%   Weight:      Height:        Intake/Output Summary (Last 24 hours) at 07/05/2022  1152 Last data filed at 07/05/2022 0400 Gross per 24 hour  Intake 360 ml  Output 1376 ml  Net -1016 ml    Filed Weights   06/20/22 2210 06/21/22 0320  Weight: 79.4 kg 79.3 kg    Examination:  General exam: No response to commands or questions respiratory system: Clear to auscultation. Respiratory effort normal. Cardiovascular system: S1 & S2 heard, RRR. No JVD, murmurs, rubs, gallops or clicks. No pedal edema. Gastrointestinal system: Abdomen is nondistended, soft and nontender. No organomegaly or masses felt. Normal bowel sounds heard. Central nervous system: Somnolent  Data Reviewed: I have personally reviewed following labs and imaging studies  CBC: Recent Labs  Lab 06/29/22 0245 06/30/22 0257 07/02/22 0356 07/05/22 0341  WBC 11.6* 10.4 7.9 7.9  HGB 13.1 13.0 11.8* 13.2  HCT 38.1* 37.4* 33.7* 38.3*  MCV 89.0 89.3 87.5 87.4  PLT 333 321 278 347    Basic Metabolic Panel: Recent Labs  Lab 06/29/22 0245 06/30/22 0257 07/02/22 0356 07/02/22 0652 07/03/22 1131 07/04/22 0236 07/05/22 0341  NA 143 141 137  --  140 136 136  K 3.8 3.5 3.1*  --  2.9* 3.6 3.4*  CL 112* 114* 111  --  115* 112* 109  CO2 18* 17* 17*  --  16* 14* 17*  GLUCOSE  193* 208* 161*  --  100* 145* 143*  BUN 21 17 16   --  14 12 16   CREATININE 0.98 1.00 0.94  --  0.93 0.91 1.02  CALCIUM 9.8 9.6 9.1  --  8.9 9.2 9.5  MG 2.0 2.0  --  1.9  --   --   --     GFR: Estimated Creatinine Clearance: 71.6 mL/min (by C-G formula based on SCr of 1.02 mg/dL). Liver Function Tests: Recent Labs  Lab 07/02/22 0356 07/05/22 0341  AST 20 32  ALT 26 38  ALKPHOS 94 99  BILITOT 0.7 0.8  PROT 6.3* 6.7  ALBUMIN 3.2* 3.5    No results for input(s): "LIPASE", "AMYLASE" in the last 168 hours. Recent Labs  Lab 06/29/22 0245 07/04/22 1037 07/05/22 0341  AMMONIA 29 59* 47*    Coagulation Profile: No results for input(s): "INR", "PROTIME" in the last 168 hours. Cardiac Enzymes: No results for input(s):  "CKTOTAL", "CKMB", "CKMBINDEX", "TROPONINI" in the last 168 hours. BNP (last 3 results) No results for input(s): "PROBNP" in the last 8760 hours. HbA1C: No results for input(s): "HGBA1C" in the last 72 hours. CBG: Recent Labs  Lab 07/04/22 0823 07/04/22 1200 07/04/22 1609 07/04/22 2135 07/05/22 0806  GLUCAP 194* 124* 113* 224* 161*    Lipid Profile: No results for input(s): "CHOL", "HDL", "LDLCALC", "TRIG", "CHOLHDL", "LDLDIRECT" in the last 72 hours. Thyroid Function Tests: No results for input(s): "TSH", "T4TOTAL", "FREET4", "T3FREE", "THYROIDAB" in the last 72 hours. Anemia Panel: No results for input(s): "VITAMINB12", "FOLATE", "FERRITIN", "TIBC", "IRON", "RETICCTPCT" in the last 72 hours. Sepsis Labs: No results for input(s): "PROCALCITON", "LATICACIDVEN" in the last 168 hours.  Recent Results (from the past 240 hour(s))  CSF culture w Gram Stain     Status: None   Collection Time: 06/28/22  4:00 PM   Specimen: PATH Cytology CSF; Cerebrospinal Fluid  Result Value Ref Range Status   Specimen Description CSF  Final   Special Requests NONE  Final   Gram Stain NO WBC SEEN NO ORGANISMS SEEN CYTOSPIN SMEAR   Final   Culture   Final    NO GROWTH 3 DAYS Performed at [facility], [address] [place], [place] [address]    Report Status 07/02/2022 FINAL  Final         Radiology Studies: No results found.      Scheduled Meds:  amiodarone  200 mg Oral BID   [START ON 07/09/2022] amiodarone  200 mg Oral Daily   aspirin EC  81 mg Oral Daily   clonazepam  0.125 mg Oral BID   enoxaparin (LOVENOX) injection  40 mg Subcutaneous Q24H   gemfibrozil  600 mg Oral BID AC   insulin aspart  0-15 Units Subcutaneous TID WC   insulin aspart  0-5 Units Subcutaneous QHS   insulin aspart  4 Units Subcutaneous TID WC   insulin glargine-yfgn  12 Units Subcutaneous Daily   lacosamide  200 mg Oral BID   lactulose  10 g Oral Daily   levETIRAcetam  500 mg Oral BID    metoprolol succinate  100 mg Oral Daily   perampanel  4 mg Oral QHS   senna-docusate  2 tablet Oral BID   topiramate  200 mg Oral BID   Continuous Infusions:     LOS: 14 days    Time spent: 38 min  Alwyn Ren, MD 07/05/2022, 11:52 AM

## 2022-07-05 NOTE — TOC Progression Note (Signed)
Transition of Care [facility]) - Progression Note    Patient Details  Name: Kyal Sabharwal MRN: [id number] Date of Birth: [birth year]  Transition of Care [facility]) CM/SW Contact  Delilah Shan, LCSWA Phone Number: 07/05/2022, 4:30 PM  Clinical Narrative:     Patient has SNF bed at [facility] when medically ready for dc. CSW will continue to follow and assist with patients dc planning needs.  Expected Discharge Plan: Skilled Nursing Facility Barriers to Discharge: Continued Medical Work up  Expected Discharge Plan and Services In-house Referral: Clinical Social Work Discharge Planning Services: CM Consult Post Acute Care Choice: Home Health Living arrangements for the past 2 months: Single Family Home                                       Social Determinants of Health (SDOH) Interventions SDOH Screenings   Food Insecurity: No Food Insecurity (06/21/2022)  Housing: Low Risk  (06/21/2022)  Transportation Needs: No Transportation Needs (06/21/2022)  Utilities: Not At Risk (06/21/2022)  Tobacco Use: Medium Risk (06/20/2022)    Readmission Risk Interventions     No data to display

## 2022-07-05 NOTE — Progress Notes (Signed)
Pt removed EEG electrodes from head.  Will inform EEG tech.  Erick Blinks, RN

## 2022-07-06 ENCOUNTER — Other Ambulatory Visit (HOSPITAL_COMMUNITY): Payer: Self-pay

## 2022-07-06 ENCOUNTER — Telehealth (HOSPITAL_COMMUNITY): Payer: Self-pay | Admitting: Pharmacy Technician

## 2022-07-06 DIAGNOSIS — D649 Anemia, unspecified: Secondary | ICD-10-CM | POA: Diagnosis not present

## 2022-07-06 DIAGNOSIS — I4719 Other supraventricular tachycardia: Secondary | ICD-10-CM | POA: Diagnosis not present

## 2022-07-06 DIAGNOSIS — R569 Unspecified convulsions: Secondary | ICD-10-CM | POA: Diagnosis not present

## 2022-07-06 LAB — COMPREHENSIVE METABOLIC PANEL
ALT: 40 U/L (ref 0–44)
AST: 55 U/L — ABNORMAL HIGH (ref 15–41)
Albumin: 3.5 g/dL (ref 3.5–5.0)
Alkaline Phosphatase: 98 U/L (ref 38–126)
Anion gap: 9 (ref 5–15)
BUN: 20 mg/dL (ref 8–23)
CO2: 15 mmol/L — ABNORMAL LOW (ref 22–32)
Calcium: 9.8 mg/dL (ref 8.9–10.3)
Chloride: 114 mmol/L — ABNORMAL HIGH (ref 98–111)
Creatinine, Ser: 1.08 mg/dL (ref 0.61–1.24)
GFR, Estimated: >60 mL/min (ref >60)
Glucose, Bld: 125 mg/dL — ABNORMAL HIGH (ref 70–99)
Potassium: 4.1 mmol/L (ref 3.5–5.1)
Sodium: 138 mmol/L (ref 135–145)
Total Bilirubin: 1.4 mg/dL — ABNORMAL HIGH (ref 0.3–1.2)
Total Protein: 6.7 g/dL (ref 6.5–8.1)

## 2022-07-06 LAB — GLUCOSE, CAPILLARY
Glucose-Capillary: 166 mg/dL — ABNORMAL HIGH (ref 70–99)
Glucose-Capillary: 263 mg/dL — ABNORMAL HIGH (ref 70–99)
Glucose-Capillary: 115 mg/dL — ABNORMAL HIGH (ref 70–99)
Glucose-Capillary: 161 mg/dL — ABNORMAL HIGH (ref 70–99)

## 2022-07-06 LAB — AMMONIA: Ammonia: 47 umol/L — ABNORMAL HIGH (ref 9–35)

## 2022-07-06 MED ORDER — CLOBAZAM 5 MG PO HALF TABLET: 5.0000 mg | ORAL_TABLET | Freq: Every day | ORAL | Status: DC

## 2022-07-06 MED ORDER — QUETIAPINE FUMARATE 25 MG PO TABS: 12.5000 mg | ORAL_TABLET | Freq: Every day | ORAL | Status: DC

## 2022-07-06 MED ORDER — CLONAZEPAM 0.125 MG PO TBDP: 0.2500 mg | ORAL_TABLET | Freq: Two times a day (BID) | ORAL | Status: DC

## 2022-07-06 MED ORDER — CLONAZEPAM 0.125 MG PO TBDP
0.2500 mg | ORAL_TABLET | Freq: Every day | ORAL | Status: DC
  Administered 2022-07-06 – 2022-07-07 (×2): 0.25 mg via ORAL
  Filled 2022-07-06 (×2): qty 2

## 2022-07-06 NOTE — Procedures (Signed)
Patient Name: Nile Drabek  MRN: [id number]  Epilepsy Attending: Charlsie Quest  Referring Physician/Provider: Charlsie Quest, MD  Duration: 07/05/2022 1325 to 07/02/2022 4098   Patient history: 67yo M with seizure like activity. EEG to evaluate for seizure.    Level of alertness: Awake, asleep   AEDs during EEG study: TPM, LCM, Clonazepam, Perampanel   Technical aspects: This EEG study was done with scalp electrodes positioned according to the 10-20 International system of electrode placement. Electrical activity was reviewed with band pass filter of 1-70Hz , sensitivity of 7 uV/mm, display speed of 63mm/sec with a 60Hz  notched filter applied as appropriate. EEG data were recorded continuously and digitally stored.  Video monitoring was available and reviewed as appropriate.   Description: The posterior dominant rhythm consists of 9-10 Hz activity of moderate voltage (25-35 uV) seen predominantly in posterior head regions, symmetric and reactive to eye opening and eye closing. Sleep was characterized by vertex waves, sleep spindles (12 to 14 Hz), maximal frontocentral region.  EEG showed continuous generalized 3-6hz  theta-delta slowing.   EEG was not interpretable between 07/05/2022 1854 to 2128 due to significant electrode artifact.   ABNORMALITY - Continuous slow, generalized    IMPRESSION: This study is suggestive of moderate diffuse encephalopathy, non specific etiology. No seizures or epileptiform discharges were seen.    Aissata Wilmore Annabelle Harman

## 2022-07-06 NOTE — TOC Progression Note (Addendum)
Transition of Care [facility]) - Progression Note    Patient Details  Name: Kyle [name] MRN: [id number] Date of Birth: 06-09-1955  [facility]) CM/SW Contact  Delilah Shan, LCSWA Phone Number: 07/06/2022, 4:04 PM  Clinical Narrative:     Patient has SNF bed at [facility] when medically ready for dc. Facility confirmed patient will need to be out of restraints for 48 hours before patient can dc over.CSW will continue to follow and assist with patients dc planning needs.  Expected Discharge Plan: Skilled Nursing Facility Barriers to Discharge: Continued Medical Work up  Expected Discharge Plan and Services In-house Referral: Clinical Social Work Discharge Planning Services: CM Consult Post Acute Care Choice: Home Health Living arrangements for the past 2 months: Single Family Home                                       Social Determinants of Health (SDOH) Interventions SDOH Screenings   Food Insecurity: No Food Insecurity (06/21/2022)  Housing: Low Risk  (06/21/2022)  Transportation Needs: No Transportation Needs (06/21/2022)  Utilities: Not At Risk (06/21/2022)  Tobacco Use: Medium Risk (06/20/2022)    Readmission Risk Interventions     No data to display

## 2022-07-06 NOTE — Progress Notes (Addendum)
Subjective: Was agitated overnight pulling on electrodes.  No seizures.  ROS: Unable to obtain due to poor mental status  Examination  Vital signs in last 24 hours: Temp:  [97.8 F (36.6 C)-98.7 F (37.1 C)] 98.1 F (36.7 C) (05/21 1225) Pulse Rate:  [60-63] 61 (05/21 1225) Resp:  [11] 11 (05/21 0757) BP: (113-150)/(64-84) 114/64 (05/21 1225) SpO2:  [90 %-95 %] 90 % (05/21 1225)  General: Sitting in bed, working with physical therapy Neuro: Awake, alert, able to tell me his name, did not follow commands for me today, PERRLA, able to track examiner in room, no facial asymmetry, spontaneously moving all 4 extremities  Basic Metabolic Panel: Recent Labs  Lab 06/30/22 0257 07/02/22 0356 07/02/22 0652 07/03/22 1131 07/04/22 0236 07/05/22 0340 07/05/22 0341 07/06/22 0212  NA 141 137  --  140 136  --  136 138  K 3.5 3.1*  --  2.9* 3.6  --  3.4* 4.1  CL 114* 111  --  115* 112*  --  109 114*  CO2 17* 17*  --  16* 14*  --  17* 15*  GLUCOSE 208* 161*  --  100* 145*  --  143* 125*  BUN 17 16  --  14 12  --  16 20  CREATININE 1.00 0.94  --  0.93 0.91  --  1.02 1.08  CALCIUM 9.6 9.1  --  8.9 9.2  --  9.5 9.8  MG 2.0  --  1.9  --   --  2.0  --   --     CBC: Recent Labs  Lab 06/30/22 0257 07/02/22 0356 07/05/22 0341  WBC 10.4 7.9 7.9  HGB 13.0 11.8* 13.2  HCT 37.4* 33.7* 38.3*  MCV 89.3 87.5 87.4  PLT 321 278 347     Coagulation Studies: No results for input(s): "LABPROT", "INR" in the last 72 hours.  Imaging No new brain imaging   ASSESSMENT AND PLAN: 67 yo M with 2 days of headache and right-sided vision changes, with blood sugars running in the 300s to 400s. EEG showed seizures arising from left posterior quadrant.  CT chest abdomen pelvis showed  eterogeneously enhancing mass in the right kidney worrisome for renal cell carcinoma. Indeterminate hypodense lesion in the left kidney.  Had lumbar puncture on 07/15/2022, serum and CSF paraneoplastic panel sent to [facility]   New onset seizure Acute toxic encephalopathy - Likely lowered seizure threshold due to hyperglycemia versus paraneoplastic -Encephalopathy was improving but appears to be worsening again.  Could be secondary to delirium   Recommendations -No seizures overnight.  Therefore we will DC Keppra and reduce Topamax to 150 mg twice daily -Will switch Klonopin to be administered at around 1700 to minimize sundowning.  Can also switch to once daily dosing tomorrow if patient continues to be excessively drowsy.  EKG showed prolonged QT interval so avoiding Seroquel -Continue following Anti seizure meds for now 1. Topiramate 150mg  BID 2. Perampanel to 4mg  QHS 3. Vimpat 200 mg BID -Will schedule follow-up with Dr. Teresa Coombs in 4 weeks, order placed.  May need to repeat MRI brain at that time to see if MRI changes of improved/resolved - Patient's seizures initially had clinical signs (gaze deviation, flashing lights).  However after starting antiseizure medication, no clinical signs were noted.  Therefore may benefit from ambulatory EEG to help guide weaning of antiseizure medications -Also needs referral to urology for for further evaluation of renal mass -Co-pay for Valtoco is very high.  Therefore  would recommend clonazepam 2 mg oral dissolving tablet for clinical seizure lasting more than 5 minutes - continue seizure precautions, delirium precautions - PRN IV versed for clinical seizure while inpatient -Discussed plan with Dr. Ashley Royalty via secure chat     ADDENDUM -W[facility].  Dr. Ashley Royalty is called and awaiting response.   I have spent a total of  35 minutes with the patient reviewing hospital notes,  test results, labs and examining the patient as well as establishing an assessment and plan.  > 50% of time was spent in direct patient care.    Lindie Spruce Epilepsy [facility] For questions after 5pm please refer to AMION to reach the  Neurologist on call

## 2022-07-06 NOTE — Telephone Encounter (Signed)
Patient Advocate Encounter  Received notification  that the request for prior authorization for cloBAZam 10MG  tablets  has been denied due to drug not covered under Medicare Part D benefit.       Roland Earl, CPhT Pharmacy Patient Advocate Specialist [facility] Patient Advocate Team Direct Number: [phone]  Fax: [phone]

## 2022-07-06 NOTE — Progress Notes (Signed)
PROGRESS NOTE    Kyle [name]  WUJ:[id number] DOB: 1955-04-10 DOA: 06/20/2022 PCP: No primary care provider on file.   Brief Narrative: 67 year old married male, independent, medical history significant for type II DM, HTN, HLD, PAD, recently seen in the ED on 05/19/2022 for complaints of visual disturbance x 2 days, unremarkable neurological exam/CT head/MRI brain, seen by neurology who recommended outpatient follow-up, also had episode of SVT with plans for outpatient cardiology follow-up, presented again to the ED on 06/20/2022 with complaints of palpitations, lightheadedness, dizziness, presyncopal.  He was found to be in SVT with heart rate of 177 which self terminated.  In the ED, also noted to have seizure-like activity for which neurology consulted.     Assessment & Plan:   Principal Problem:   Focal seizures (HCC) Active Problems:   Hypertension   Diabetes (HCC)   Hyperlipidemia   PAD (peripheral artery disease) (HCC)   Seizure (HCC)   Migraines   SVT (supraventricular tachycardia)   Visual disturbances   Statin intolerance  Focal seizures: Noted to have seizure-like activity in the emergency department.  Apparently ongoing since April.  PCP started Topamax 4/24 without improvement.   MRI brain from 06/29/2022 septal abnormality reflecting affecting the left occipital lobe with mildly heterogenous diffusion and FLAIR.  Favor nonketotic hyperglycemic occipital seizure.  No evidence of intracranial mets.  Indeterminate sclerotic lesion of the C3 vertebral body. MRI brain from 4/3 showed small curvilinear focus of susceptibility artifact involving the left occipital lobe likely a small focus of chronic hemosiderin staining.  Neurology was consulted.   Patient placed on continuous EEG. Currently patient is on the following medications: Clonazepam, lacosamide, Topamax, perampanel (plus stopped on 07/05/2022 as no seizure activity was noted.)   A CT scan of the chest abdomen pelvis  was done which revealed a right kidney mass concerning for renal cell carcinoma.    Patient underwent LP.  Traumatic tap was noted.  CT head did not show any acute findings.  Elevated ammonia level (unclear etiology question secondary to Topamax induced ) due to profuse diarrhea lactulose has been stopped temporarily.  Ammonia level has been stable at 47.     Paroxysmal SVT/AVNRT Admission EKG showed SVT at 170.     TSH normal at 2.14.   Echocardiogram shows LVEF of 55 to 60%.  No regional wall motion abnormalities.  Grade 1 diastolic dysfunction was noted.  RV systolic function was normal.  No significant valvular abnormalities. Patient was seen by cardiology.  Patient was started on metoprolol.   On the morning of 5/10 patient again noted to be tachycardic.  EKG initially suggested atrial flutter.   Patient seen by cardiology.  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  Patient converted to sinus rhythm after he was given his a.m. dose of metoprolol. Cardiology signed off 06/28/2022.  Their recommendations are to continue amiodarone 200 twice daily now and then changed to 200 daily starting 07/09/2022.  Continue aspirin gemfibrozil and metoprolol.  Acute kidney injury, resolved  Creatinine in Care Everywhere 0.95 in January.  Presented with creatinine of 1.62.  Lisinopril held.   Patient was given IV fluids.  Improvement in renal function noted.   Consider resuming lisinopril and Aldactone at time of discharge.   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    Poorly controlled type II DM with hyperglycemia: A1c 11.6.  Home metformin and glipizide on hold.  Currently on glargine and SSI.  CBG (last 3)  Recent Labs    07/05/22 2146 07/06/22 0756 07/06/22 1144  GLUCAP 263* 166* 263*    Essential hypertension/hyperaldosteronism/hypokalemia Home lisinopril and spironolactone on hold.   Currently on  metoprolol. Increase metoprolol to 100 mg daily. Continue amiodarone  Restarted hydralazine on 07/05/2022   Hyperlipidemia/hypertriglyceridemia Continue gemfibrozil.  Statin intolerant.  Per cardiology, recommend lipid clinic evaluation and they will arrange follow-up   PAD: Continue aspirin and gemfibrozil.   Normocytic anemia: Likely dilutional drop in hemoglobin which is stable.  No evidence of overt bleeding.   Nongap acidosis secondary to profuse diarrhea from lactulose.  Monitor closely.  Sundowning versus delirium-starting low-dose Seroquel.  Estimated body mass index is 25.08 kg/m as calculated from the following:   Height as of this encounter: 5\' 10"  (1.778 m).   Weight as of this encounter: 79.3 kg.  DVT prophylaxis: Lovenox  code Status: Full code  family Communication: Patient's wife and sister  disposition Plan:  Status is: Inpatient Remains inpatient appropriate because: seizures   Consultants:  neuro  Procedures: eeg Antimicrobials: none Subjective: He has been pulling out lines and EEG leads overnight soft restraints were placed for this to protect.  objective: Vitals:   07/05/22 1203 07/05/22 2030 07/06/22 0319 07/06/22 0757  BP: 93/65 (!) 144/72 113/66 (!) 150/84  Pulse: (!) 58 62 60 63  Resp: 14  11 11   Temp: 98.7 F (37.1 C) 98.7 F (37.1 C) 98.6 F (37 C) 97.8 F (36.6 C)  TempSrc: Oral  Oral Oral  SpO2:  95% 95% 95%  Weight:      Height:        Intake/Output Summary (Last 24 hours) at 07/06/2022 1220 Last data filed at 07/06/2022 0830 Gross per 24 hour  Intake 360 ml  Output 1101 ml  Net -741 ml    Filed Weights   06/20/22 2210 06/21/22 0320  Weight: 79.4 kg 79.3 kg    Examination:  General exam: Resting in bed with eyes open and responds appropriately to questions though he is very slow in answering. respiratory system: Clear to auscultation. Respiratory effort normal. Cardiovascular system: S1 & S2 heard, RRR. No JVD,  murmurs, rubs, gallops or clicks. No pedal edema. Gastrointestinal system: Abdomen is nondistended, soft and nontender. No organomegaly or masses felt. Normal bowel sounds heard. Central nervous system: Awake  Data Reviewed: I have personally reviewed following labs and imaging studies  CBC: Recent Labs  Lab 06/30/22 0257 07/02/22 0356 07/05/22 0341  WBC 10.4 7.9 7.9  HGB 13.0 11.8* 13.2  HCT 37.4* 33.7* 38.3*  MCV 89.3 87.5 87.4  PLT 321 278 347    Basic Metabolic Panel: Recent Labs  Lab 06/30/22 0257 07/02/22 0356 07/02/22 0652 07/03/22 1131 07/04/22 0236 07/05/22 0340 07/05/22 0341 07/06/22 0212  NA 141 137  --  140 136  --  136 138  K 3.5 3.1*  --  2.9* 3.6  --  3.4* 4.1  CL 114* 111  --  115* 112*  --  109 114*  CO2 17* 17*  --  16* 14*  --  17* 15*  GLUCOSE 208* 161*  --  100* 145*  --  143* 125*  BUN 17 16  --  14 12  --  16 20  CREATININE 1.00 0.94  --  0.93 0.91  --  1.02 1.08  CALCIUM 9.6 9.1  --  8.9 9.2  --  9.5 9.8  MG 2.0  --  1.9  --   --  2.0  --   --     GFR: Estimated Creatinine Clearance: 67.6 mL/min (by C-G formula based on SCr of 1.08 mg/dL). Liver Function Tests: Recent Labs  Lab 07/02/22 0356 07/05/22 0341 07/06/22 0212  AST 20 32 55*  ALT 26 38 40  ALKPHOS 94 99 98  BILITOT 0.7 0.8 1.4*  PROT 6.3* 6.7 6.7  ALBUMIN 3.2* 3.5 3.5    No results for input(s): "LIPASE", "AMYLASE" in the last 168 hours. Recent Labs  Lab 07/04/22 1037 07/05/22 0341 07/06/22 0212  AMMONIA 59* 47* 47*    Coagulation Profile: No results for input(s): "INR", "PROTIME" in the last 168 hours. Cardiac Enzymes: No results for input(s): "CKTOTAL", "CKMB", "CKMBINDEX", "TROPONINI" in the last 168 hours. BNP (last 3 results) No results for input(s): "PROBNP" in the last 8760 hours. HbA1C: No results for input(s): "HGBA1C" in the last 72 hours. CBG: Recent Labs  Lab 07/05/22 1206 07/05/22 1613 07/05/22 2146 07/06/22 0756 07/06/22 1144  GLUCAP 127*  140* 263* 166* 263*    Lipid Profile: No results for input(s): "CHOL", "HDL", "LDLCALC", "TRIG", "CHOLHDL", "LDLDIRECT" in the last 72 hours. Thyroid Function Tests: No results for input(s): "TSH", "T4TOTAL", "FREET4", "T3FREE", "THYROIDAB" in the last 72 hours. Anemia Panel: No results for input(s): "VITAMINB12", "FOLATE", "FERRITIN", "TIBC", "IRON", "RETICCTPCT" in the last 72 hours. Sepsis Labs: No results for input(s): "PROCALCITON", "LATICACIDVEN" in the last 168 hours.  Recent Results (from the past 240 hour(s))  CSF culture w Gram Stain     Status: None   Collection Time: 06/28/22  4:00 PM   Specimen: PATH Cytology CSF; Cerebrospinal Fluid  Result Value Ref Range Status   Specimen Description CSF  Final   Special Requests NONE  Final   Gram Stain NO WBC SEEN NO ORGANISMS SEEN CYTOSPIN SMEAR   Final   Culture   Final    NO GROWTH 3 DAYS Performed at [facility], [address], [place], [place] [address]    Report Status 07/02/2022 FINAL  Final         Radiology Studies: Overnight EEG with video  Result Date: 07/06/2022 Charlsie Quest, MD     07/06/2022  9:16 AM Patient Name: Kyle [name] MRN: [id number] Epilepsy Attending: Charlsie Quest Referring Physician/Provider: Charlsie Quest, MD Duration: 07/05/2022 1325 to 07/02/2022 0915  Patient history: 67yo M with seizure like activity. EEG to evaluate for seizure.  Level of alertness: Awake, asleep  AEDs during EEG study: TPM, LCM, Clonazepam, Perampanel  Technical aspects: This EEG study was done with scalp electrodes positioned according to the 10-20 International system of electrode placement. Electrical activity was reviewed with band pass filter of 1-70Hz , sensitivity of 7 uV/mm, display speed of 38mm/sec with a 60Hz  notched filter applied as appropriate. EEG data were recorded continuously and digitally stored.  Video monitoring was available and reviewed as appropriate.  Description: The posterior  dominant rhythm consists of 9-10 Hz activity of moderate voltage (25-35 uV) seen predominantly in posterior head regions, symmetric and reactive to eye opening and eye closing. Sleep was characterized by vertex waves, sleep spindles (12 to 14 Hz), maximal frontocentral region.  EEG showed continuous generalized 3-6hz  theta-delta slowing. EEG was not interpretable between 07/05/2022 1854 to 2128 due to significant electrode artifact.  ABNORMALITY - Continuous slow, generalized   IMPRESSION: This study is suggestive of moderate diffuse encephalopathy, non specific etiology. No seizures or epileptiform discharges were seen.  Priyanka Annabelle Harman  Scheduled Meds:  amiodarone  200 mg Oral BID   [START ON 07/09/2022] amiodarone  200 mg Oral Daily   aspirin EC  81 mg Oral Daily   clonazepam  0.25 mg Oral BID   enoxaparin (LOVENOX) injection  40 mg Subcutaneous Q24H   gemfibrozil  600 mg Oral BID AC   hydrALAZINE  25 mg Oral Q8H   insulin aspart  0-15 Units Subcutaneous TID WC   insulin aspart  0-5 Units Subcutaneous QHS   insulin aspart  4 Units Subcutaneous TID WC   insulin glargine-yfgn  12 Units Subcutaneous Daily   lacosamide  200 mg Oral BID   metoprolol succinate  100 mg Oral Daily   perampanel  4 mg Oral QHS   senna-docusate  2 tablet Oral BID   topiramate  150 mg Oral BID   Continuous Infusions:     LOS: 15 days    Time spent: 38 min  Alwyn Ren, MD 07/06/2022, 12:20 PM

## 2022-07-06 NOTE — Progress Notes (Addendum)
LTM maint complete - no skin breakdown  Serviced Fp1.   Patient in safety restraints, and mittens. [facility] monitored, Event button test confirmed by [facility].

## 2022-07-06 NOTE — TOC Benefit Eligibility Note (Signed)
Patient Product/process development scientist completed.    The patient is currently admitted and upon discharge could be taking clobazam (Onfi) 10 mg tablets.  Requires Prior Authorization  The patient is insured through [employer]   This test claim was processed through [facility]- copay amounts may vary at other pharmacies due to pharmacy/plan contracts, or as the patient moves through the different stages of their insurance plan.  Roland Earl, CPHT Pharmacy Patient Advocate Specialist [facility] Patient Advocate Team Direct Number: [phone]  Fax: [phone]

## 2022-07-06 NOTE — Progress Notes (Signed)
Physical Therapy Treatment Patient Details Name: Kyle [name] MRN: [id number] DOB: 1956/02/05 Today's Date: 07/06/2022   History of Present Illness Pt is 67 year old presented to [facility] on 06/20/22 for SVT and visual changes. Neurology consulted and felt likely focal seizures with postictal Todds vision impairment, CT head no acute findings. LP 5/13. 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  PMH - DM, HTN, PAD,    PT Comments    Pt is sound asleep on entry with HoB maximally elevated and head flexed forward. Mitts and wrist restraints in place but not tied. Pt requires maximal multimodal stimulation for participation, including cold wet rag. Ultimately with bringing pt to EoB, limited interaction occurred. Pt only able to hold sitting balance for approximately 20 sec when he was having his back scratched. Requires total A for returning to bed. D/c plans remain appropriate at this time.      Recommendations for follow up therapy are one component of a multi-disciplinary discharge planning process, led by the attending physician.  Recommendations may be updated based on patient status, additional functional criteria and insurance authorization.  Follow Up Recommendations  Can patient physically be transported by private vehicle: No    Assistance Recommended at Discharge Frequent or constant Supervision/Assistance  Patient can return home with the following Two people to help with walking and/or transfers;Two people to help with bathing/dressing/bathroom;Assistance with cooking/housework;Assistance with feeding;Direct supervision/assist for medications management;Direct supervision/assist for financial management;Help with stairs or ramp for entrance;Assist for transportation   Equipment Recommendations  Other (comment) (TBD post-acute)    Recommendations for Other Services       Precautions /  Restrictions Precautions Precautions: Fall;Other (comment) Precaution Comments: seizures Restrictions Weight Bearing Restrictions: No     Mobility  Bed Mobility Overal bed mobility: Needs Assistance Bed Mobility: Supine to Sit, Sit to Supine Rolling: Total assist, +2 for physical assistance   Supine to sit: Total assist, +2 for physical assistance     General bed mobility comments: total A for coming to EoB, with maximal cuing, and total A for returning to supine          Balance Overall balance assessment: Needs assistance Sitting-balance support: Single extremity supported Sitting balance-Leahy Scale: Zero Sitting balance - Comments: requires outside support for all except about 20 sec during session Postural control: Posterior lean                                  Cognition Arousal/Alertness: Lethargic Behavior During Therapy: Flat affect Overall Cognitive Status: Impaired/Different from baseline Area of Impairment: Orientation, Following commands, Safety/judgement, Memory, Attention, Awareness, Problem solving                 Orientation Level: Disoriented to, Place, Time, Situation (only responds yes when asked if his name is Kyle [name], unable to state his name) Current Attention Level: Focused Memory: Decreased short-term memory Following Commands: Follows one step commands with increased time Safety/Judgement: Decreased awareness of safety, Decreased awareness of deficits Awareness: Intellectual Problem Solving: Slow processing, Decreased initiation, Difficulty sequencing, Requires verbal cues, Requires tactile cues General Comments: very lethargic requires maximal stimulation to rouse and then only nods head and grunts to questions           General Comments General comments (skin integrity, edema, etc.): VSS on RA, only interaction when asked if his back itched and then was able to  hold sitting to have back rubbed with washcloth       Pertinent Vitals/Pain Pain Assessment Pain Assessment: Faces Faces Pain Scale: No hurt     PT Goals (current goals can now be found in the care plan section) Acute Rehab PT Goals PT Goal Formulation: With patient Time For Goal Achievement: 07/09/22 Potential to Achieve Goals: Fair Progress towards PT goals: Not progressing toward goals - comment    Frequency    Min 1X/week      PT Plan Current plan remains appropriate       AM-PAC PT "6 Clicks" Mobility   Outcome Measure  Help needed turning from your back to your side while in a flat bed without using bedrails?: Total Help needed moving from lying on your back to sitting on the side of a flat bed without using bedrails?: Total Help needed moving to and from a bed to a chair (including a wheelchair)?: Total Help needed standing up from a chair using your arms (e.g., wheelchair or bedside chair)?: Total Help needed to walk in hospital room?: Total Help needed climbing 3-5 steps with a railing? : Total 6 Click Score: 6    End of Session   Activity Tolerance: Patient limited by lethargy Patient left: with call bell/phone within reach;in bed;with bed alarm set Nurse Communication: Mobility status PT Visit Diagnosis: Other abnormalities of gait and mobility (R26.89);Muscle weakness (generalized) (M62.81);Other symptoms and signs involving the nervous system (U98.119)     Time: 1478-2956 PT Time Calculation (min) (ACUTE ONLY): 22 min  Charges:  $Therapeutic Activity: 8-22 mins                     Kyle [name] B. Beverely Risen PT, DPT Acute Rehabilitation Services Please use secure chat or  Call Office [phone]    Elon Alas [facility] 07/06/2022, 3:11 PM

## 2022-07-06 NOTE — Telephone Encounter (Signed)
Patient Advocate Encounter   Received notification that prior authorization for cloBAZam 10MG  tablets is required.   PA submitted on 07/06/2022 [employer] [id number] Insurance  [facility] Medicare Electronic Prior Authorization Request Form Status is pending       Roland Earl, CPhT Pharmacy Patient Advocate Specialist [facility] Patient Advocate Team Direct Number: [phone]  Fax: [phone]

## 2022-07-06 NOTE — Progress Notes (Signed)
Occupational Therapy Treatment Patient Details Name: Kyle [name] MRN: [id number] DOB: April 16, 1955 Today's Date: 07/06/2022   History of present illness Pt is 67 year old presented to [facility] on 06/20/22 for SVT and visual changes. Neurology consulted and felt likely focal seizures with postictal Todds vision impairment, CT head no acute findings. LP 5/13. 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  PMH - DM, HTN, PAD,   OT comments  Focus of session on self feeding and drinking, requiring minimal physical assist and moderate verbal cues to completes. Pt cued to keep visual attention to task. Bed in chair position at end of session.   Recommendations for follow up therapy are one component of a multi-disciplinary discharge planning process, led by the attending physician.  Recommendations may be updated based on patient status, additional functional criteria and insurance authorization.    Assistance Recommended at Discharge Frequent or constant Supervision/Assistance  Patient can return home with the following  Two people to help with walking and/or transfers;Two people to help with bathing/dressing/bathroom;Assistance with cooking/housework;Assistance with feeding;Direct supervision/assist for medications management;Direct supervision/assist for financial management;Assist for transportation;Help with stairs or ramp for entrance   Equipment Recommendations  Other (comment) (defer to next venue)    Recommendations for Other Services      Precautions / Restrictions Precautions Precautions: Fall;Other (comment) Precaution Comments: seizures Restrictions Weight Bearing Restrictions: No       Mobility Bed Mobility Overal bed mobility: Needs Assistance Bed Mobility: Rolling Rolling: Max assist         General bed mobility comments: rolled to check periarea for cleanliness, +2 total assist to pull up  in bed prior to eating    Transfers                         Balance                                           ADL either performed or assessed with clinical judgement   ADL Overall ADL's : Needs assistance/impaired Eating/Feeding: Minimal assistance;Bed level Eating/Feeding Details (indicate cue type and reason): min assist at times to scoop, consistently needing assist to bring spoon or cup with straw all the way to his mouth, cue to clear mouth before taking another bite Grooming: Wash/dry face;Bed level;Total assistance                                      Extremity/Trunk Assessment              Vision       Perception     Praxis      Cognition Arousal/Alertness: Lethargic Behavior During Therapy: Flat affect Overall Cognitive Status: Impaired/Different from baseline Area of Impairment: Orientation, Following commands, Safety/judgement, Memory, Attention, Awareness, Problem solving                 Orientation Level: Disoriented to, Place, Time, Situation Current Attention Level: Focused Memory: Decreased short-term memory Following Commands: Follows one step commands consistently, Follows multi-step commands inconsistently Safety/Judgement: Decreased awareness of safety, Decreased awareness of deficits Awareness: Intellectual Problem Solving: Slow processing, Decreased initiation, Difficulty sequencing, Requires verbal cues, Requires tactile cues General Comments: Pt bringing unloaded spoon to mouth, cues to open  eyes, in restraints before and after session due to pulling at EEG lines.        Exercises      Shoulder Instructions       General Comments      Pertinent Vitals/ Pain       Pain Assessment Pain Assessment: Faces Faces Pain Scale: No hurt  Home Living                                          Prior Functioning/Environment              Frequency  Min 2X/week         Progress Toward Goals  OT Goals(current goals can now be found in the care plan section)  Progress towards OT goals: Progressing toward goals  Acute Rehab OT Goals OT Goal Formulation: Patient unable to participate in goal setting Time For Goal Achievement: 07/12/22 Potential to Achieve Goals: Fair  Plan Discharge plan remains appropriate    Co-evaluation                 AM-PAC OT "6 Clicks" Daily Activity     Outcome Measure   Help from another person eating meals?: A Lot Help from another person taking care of personal grooming?: Total Help from another person toileting, which includes using toliet, bedpan, or urinal?: Total Help from another person bathing (including washing, rinsing, drying)?: Total Help from another person to put on and taking off regular upper body clothing?: Total Help from another person to put on and taking off regular lower body clothing?: Total 6 Click Score: 7    End of Session    OT Visit Diagnosis: Muscle weakness (generalized) (M62.81);Other symptoms and signs involving cognitive function;Other abnormalities of gait and mobility (R26.89);Unsteadiness on feet (R26.81)   Activity Tolerance Patient tolerated treatment well   Patient Left in bed;with call bell/phone within reach;with bed alarm set;with restraints reapplied (mitts and restraints reapplied, pt in chair position in bed)   Nurse Communication          Time: 4098-1191 OT Time Calculation (min): 36 min  Charges: OT General Charges $OT Visit: 1 Visit OT Treatments $Self Care/Home Management : 23-37 mins  Berna Spare, OTR/L Acute Rehabilitation Services Office: [phone]   Evern Bio 07/06/2022, 9:31 AM

## 2022-07-06 NOTE — Progress Notes (Signed)
EEG D/C'd. No skin breakdown noted. [facility] notified.  

## 2022-07-07 DIAGNOSIS — R569 Unspecified convulsions: Secondary | ICD-10-CM | POA: Diagnosis not present

## 2022-07-07 LAB — COMPREHENSIVE METABOLIC PANEL
ALT: 36 U/L (ref 0–44)
AST: 26 U/L (ref 15–41)
Albumin: 3.4 g/dL — ABNORMAL LOW (ref 3.5–5.0)
Alkaline Phosphatase: 88 U/L (ref 38–126)
Anion gap: 10 (ref 5–15)
BUN: 22 mg/dL (ref 8–23)
CO2: 18 mmol/L — ABNORMAL LOW (ref 22–32)
Calcium: 9.7 mg/dL (ref 8.9–10.3)
Chloride: 110 mmol/L (ref 98–111)
Creatinine, Ser: 1.1 mg/dL (ref 0.61–1.24)
GFR, Estimated: >60 mL/min (ref >60)
Glucose, Bld: 166 mg/dL — ABNORMAL HIGH (ref 70–99)
Potassium: 3.2 mmol/L — ABNORMAL LOW (ref 3.5–5.1)
Sodium: 138 mmol/L (ref 135–145)
Total Bilirubin: 0.6 mg/dL (ref 0.3–1.2)
Total Protein: 6.4 g/dL — ABNORMAL LOW (ref 6.5–8.1)

## 2022-07-07 LAB — CBC
HCT: 34.9 % — ABNORMAL LOW (ref 39.0–52.0)
Hemoglobin: 12.3 g/dL — ABNORMAL LOW (ref 13.0–17.0)
MCH: 30.7 pg (ref 26.0–34.0)
MCHC: 35.2 g/dL (ref 30.0–36.0)
MCV: 87 fL (ref 80.0–100.0)
Platelets: 327 10*3/uL (ref 150–400)
RBC: 4.01 MIL/uL — ABNORMAL LOW (ref 4.22–5.81)
RDW: 14.5 % (ref 11.5–15.5)
WBC: 9.4 10*3/uL (ref 4.0–10.5)
nRBC: 0 % (ref 0.0–0.2)

## 2022-07-07 LAB — GLUCOSE, CAPILLARY
Glucose-Capillary: 188 mg/dL — ABNORMAL HIGH (ref 70–99)
Glucose-Capillary: 239 mg/dL — ABNORMAL HIGH (ref 70–99)
Glucose-Capillary: 109 mg/dL — ABNORMAL HIGH (ref 70–99)
Glucose-Capillary: 166 mg/dL — ABNORMAL HIGH (ref 70–99)

## 2022-07-07 MED ORDER — POTASSIUM CHLORIDE CRYS ER 20 MEQ PO TBCR
40.0000 meq | EXTENDED_RELEASE_TABLET | Freq: Two times a day (BID) | ORAL | Status: AC
  Administered 2022-07-07 (×2): 40 meq via ORAL
  Filled 2022-07-07 (×2): qty 2

## 2022-07-07 NOTE — Progress Notes (Signed)
Subjective: No acute events overnight.  Was awake, watching TV.  Denied any concerns.  ROS: negative except above  Examination  Vital signs in last 24 hours: Temp:  [97.8 F (36.6 C)-98.3 F (36.8 C)] 97.8 F (36.6 C) (05/22 1249) Pulse Rate:  [57-64] 59 (05/22 1249) Resp:  [10-20] 10 (05/22 0755) BP: (133-164)/(68-85) 133/68 (05/22 0755) SpO2:  [94 %-98 %] 96 % (05/22 1249)  General: Sitting in bed, not in apparent distress Neuro: MS: Alert, orientedX3, follows commands, unable to name objects and poor attention span CN: pupils equal and reactive,  EOMI, face symmetric, tongue midline, normal sensation over face, Motor: 5/5 strength in all 4 extremities  Basic Metabolic Panel: Recent Labs  Lab 07/02/22 0652 07/03/22 1131 07/04/22 0236 07/05/22 0340 07/05/22 0341 07/06/22 0212 07/07/22 0135  NA  --  140 136  --  136 138 138  K  --  2.9* 3.6  --  3.4* 4.1 3.2*  CL  --  115* 112*  --  109 114* 110  CO2  --  16* 14*  --  17* 15* 18*  GLUCOSE  --  100* 145*  --  143* 125* 166*  BUN  --  14 12  --  16 20 22   CREATININE  --  0.93 0.91  --  1.02 1.08 1.10  CALCIUM  --  8.9 9.2  --  9.5 9.8 9.7  MG 1.9  --   --  2.0  --   --   --     CBC: Recent Labs  Lab 07/02/22 0356 07/05/22 0341 07/07/22 0135  WBC 7.9 7.9 9.4  HGB 11.8* 13.2 12.3*  HCT 33.7* 38.3* 34.9*  MCV 87.5 87.4 87.0  PLT 278 347 327     Coagulation Studies: No results for input(s): "LABPROT", "INR" in the last 72 hours.  Imaging No new brain imaging overnight  ASSESSMENT AND PLAN:  67 yo M with 2 days of headache and right-sided vision changes, with blood sugars running in the 300s to 400s. EEG showed seizures arising from left posterior quadrant.  CT chest abdomen pelvis showed  eterogeneously enhancing mass in the right kidney worrisome for renal cell carcinoma. Indeterminate hypodense lesion in the left kidney.  Had lumbar puncture on 07/15/2022, serum and CSF paraneoplastic panel sent to [facility]   New onset seizure Acute toxic encephalopathy, improving - Likely lowered seizure threshold due to hyperglycemia versus paraneoplastic -Encephalopathy likely secondary to medications, and hospital delirium   Recommendations -Continue following Anti seizure meds for now 1. Topiramate 150mg  BID 2. Perampanel to 4mg  QHS 3. Vimpat 200 mg BID 4.  Klonopin 0.25 mg at 5 PM -Will schedule follow-up with Dr. Teresa Coombs in 4 weeks, order placed.  May need to repeat MRI brain at that time to see if MRI changes of improved/resolved - Patient's seizures initially had clinical signs (gaze deviation, flashing lights).  However after starting antiseizure medication, no clinical signs were noted.  Therefore may benefit from ambulatory EEG to help guide weaning of antiseizure medications -Also needs referral to urology for for further evaluation of renal mass -Co-pay for Valt[employer] is very high.  Therefore would recommend clonazepam 2 mg oral dissolving tablet for clinical seizure lasting more than 5 minutes - continue seizure precautions, delirium precautions - PRN IV versed for clinical seizure while inpatient -Discussed plan with Dr.Ghimire via secure chat     I have spent a total of  26 minutes with the patient reviewing hospital notes,  test results, labs and examining the patient as well as establishing an assessment and plan.  > 50% of time was spent in direct patient care.      Lindie Spruce Epilepsy [facility] For questions after 5pm please refer to AMION to reach the Neurologist on call

## 2022-07-07 NOTE — Care Management Important Message (Signed)
Important Message  Patient Details  Name: Abdelaziz Burts MRN: [id number] Date of Birth: [birth year]   Medicare Important Message Given:  Yes     Sherilyn Banker 07/07/2022, 12:10 PM

## 2022-07-07 NOTE — TOC Progression Note (Signed)
Transition of Care [facility]) - Progression Note    Patient Details  Name: Kyle [name] MRN: [id number] Date of Birth: [birth year]  Transition of Care [facility]) CM/SW Contact  Delilah Shan, LCSWA Phone Number: 07/07/2022, 11:29 AM  Clinical Narrative:     Patient has SNF bed at [facility] when medically ready for dc. Facility confirmed patient will need to be out of restraints for 48 hours before patient can dc over.CSW will continue to follow and assist with patients dc planning needs.   Expected Discharge Plan: Skilled Nursing Facility Barriers to Discharge: Continued Medical Work up  Expected Discharge Plan and Services In-house Referral: Clinical Social Work Discharge Planning Services: CM Consult Post Acute Care Choice: Home Health Living arrangements for the past 2 months: Single Family Home                                       Social Determinants of Health (SDOH) Interventions SDOH Screenings   Food Insecurity: No Food Insecurity (06/21/2022)  Housing: Low Risk  (06/21/2022)  Transportation Needs: No Transportation Needs (06/21/2022)  Utilities: Not At Risk (06/21/2022)  Tobacco Use: Medium Risk (06/20/2022)    Readmission Risk Interventions     No data to display

## 2022-07-07 NOTE — Progress Notes (Signed)
PROGRESS NOTE    Kyle [name]  ZOX:[id number] DOB: 01/27/1956 DOA: 06/20/2022 PCP: No primary care provider on file.    Brief Narrative:  67 year old with history of type 2 diabetes, hypertension, hyperlipidemia peripheral artery disease.  He presented emergency room on 05/19/2022 with complaints of visual disturbances per today's, unremarkable neurological exam, CT head MRI brain and neurological test were normal and was discharged with outpatient follow-up.  He presented back to the emergency room on 5/5 with palpitations, lightheadedness, dizziness and presyncopal episodes.  He was found to be in SVT with heart rate of 177 that self terminated.  In the emergency room, he was found to have seizure-like activity.  Remains in the hospital due to prolonged seizure-like activities, delirium.    Assessment & Plan:   Focal seizures: Noted with seizure-like activity on admission.  Apparently on Topamax before coming to the hospital. MRI brain 5/14 with septal abnormality left occipital lobe. MRI brain 4/3 with a small curvilinear focus susceptibility artifact involving left occipital lobe Continuous EEG that demonstrated seizures. Patient currently on Lacosamide 200 mg twice daily Topamax 150 mg twice daily Perampanel 4 mg daily Clonazepam 2 mg for breakthrough seizures. Developed significant delirium related to medications and underlying seizures, some clinical improvement today.  Followed by neurology.  Paroxysmal SVT/AVNRT: Presented with SPGR heart rate 170.  TSH normal. Echocardiogram with ejection fraction 55 to 60%.  Grade 1 diastolic dysfunction. Patient was seen by cardiology.  Currently in sinus rhythm.  Remains on metoprolol and amiodarone with plan to taper off amiodarone on 5/24.  AKI: Resolved.  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  Poorly controlled type 2 diabetes  with hyperglycemia: Hemoglobin A1c 11.6.  On metformin and glipizide at home.  Currently remains on glargine and SSI.  Hypokalemia: Replace with oral potassium.  Essential hypertension, hyperaldosteronism and hypokalemia: Home medications including lisinopril and aspirin lactone were on hold. Currently on metoprolol and hydralazine. Currently on amiodarone.    DVT prophylaxis: enoxaparin (LOVENOX) injection 40 mg Start: 06/28/22 2200   Code Status: Full code Family Communication: wife on the phone  Disposition Plan: Status is: Inpatient Remains inpatient appropriate because: Seizure management, needs a SNF     Consultants:  Neurology Cardiology  Procedures:  Lumbar puncture  Antimicrobials:  None   Subjective: Patient seen and examined. He was sleeping in the morning . Later in the afternoon, he is awake , alert and can talk in simple sentences. Difficulty expressing words, still with posey vest.   Objective: Vitals:   07/06/22 2000 07/07/22 0317 07/07/22 0755 07/07/22 1249  BP:  (!) 164/80 133/68   Pulse: 63  (!) 57 (!) 59  Resp: 20 17 10    Temp:  97.8 F (36.6 C) 97.8 F (36.6 C) 97.8 F (36.6 C)  TempSrc:  Oral Axillary Oral  SpO2: 98% 98% 98% 96%  Weight:      Height:        Intake/Output Summary (Last 24 hours) at 07/07/2022 1410 Last data filed at 07/07/2022 1200 Gross per 24 hour  Intake 594 ml  Output 1800 ml  Net -1206 ml   Filed Weights   06/20/22 2210 06/21/22 0320  Weight: 79.4 kg 79.3 kg    Examination:  General: alert, awake , oriented to self and family, slow to answer, forgetful  Cardiovascular: S1-S2 normal.  Regular rate rhythm. Respiratory: Bilateral clear.  No added sounds. Gastrointestinal: Soft.  Nontender.  Bowel sound present. Ext: No swelling or edema.  No cyanosis. Neuro: Alert and awake.  Oriented to himself and family.  Slow to respond.  Mildly tremulous.     Data Reviewed: I have personally reviewed following labs  and imaging studies  CBC: Recent Labs  Lab 07/02/22 0356 07/05/22 0341 07/07/22 0135  WBC 7.9 7.9 9.4  HGB 11.8* 13.2 12.3*  HCT 33.7* 38.3* 34.9*  MCV 87.5 87.4 87.0  PLT 278 347 327   Basic Metabolic Panel: Recent Labs  Lab 07/02/22 0652 07/03/22 1131 07/04/22 0236 07/05/22 0340 07/05/22 0341 07/06/22 0212 07/07/22 0135  NA  --  140 136  --  136 138 138  K  --  2.9* 3.6  --  3.4* 4.1 3.2*  CL  --  115* 112*  --  109 114* 110  CO2  --  16* 14*  --  17* 15* 18*  GLUCOSE  --  100* 145*  --  143* 125* 166*  BUN  --  14 12  --  16 20 22   CREATININE  --  0.93 0.91  --  1.02 1.08 1.10  CALCIUM  --  8.9 9.2  --  9.5 9.8 9.7  MG 1.9  --   --  2.0  --   --   --    GFR: Estimated Creatinine Clearance: 66.4 mL/min (by C-G formula based on SCr of 1.1 mg/dL). Liver Function Tests: Recent Labs  Lab 07/02/22 0356 07/05/22 0341 07/06/22 0212 07/07/22 0135  AST 20 32 55* 26  ALT 26 38 40 36  ALKPHOS 94 99 98 88  BILITOT 0.7 0.8 1.4* 0.6  PROT 6.3* 6.7 6.7 6.4*  ALBUMIN 3.2* 3.5 3.5 3.4*   No results for input(s): "LIPASE", "AMYLASE" in the last 168 hours. Recent Labs  Lab 07/04/22 1037 07/05/22 0341 07/06/22 0212  AMMONIA 59* 47* 47*   Coagulation Profile: No results for input(s): "INR", "PROTIME" in the last 168 hours. Cardiac Enzymes: No results for input(s): "CKTOTAL", "CKMB", "CKMBINDEX", "TROPONINI" in the last 168 hours. BNP (last 3 results) No results for input(s): "PROBNP" in the last 8760 hours. HbA1C: No results for input(s): "HGBA1C" in the last 72 hours. CBG: Recent Labs  Lab 07/06/22 1144 07/06/22 1644 07/06/22 2119 07/07/22 0752 07/07/22 1150  GLUCAP 263* 115* 161* 188* 239*   Lipid Profile: No results for input(s): "CHOL", "HDL", "LDLCALC", "TRIG", "CHOLHDL", "LDLDIRECT" in the last 72 hours. Thyroid Function Tests: No results for input(s): "TSH", "T4TOTAL", "FREET4", "T3FREE", "THYROIDAB" in the last 72 hours. Anemia Panel: No results  for input(s): "VITAMINB12", "FOLATE", "FERRITIN", "TIBC", "IRON", "RETICCTPCT" in the last 72 hours. Sepsis Labs: No results for input(s): "PROCALCITON", "LATICACIDVEN" in the last 168 hours.  Recent Results (from the past 240 hour(s))  CSF culture w Gram Stain     Status: None   Collection Time: 06/28/22  4:00 PM   Specimen: PATH Cytology CSF; Cerebrospinal Fluid  Result Value Ref Range Status   Specimen Description CSF  Final   Special Requests NONE  Final   Gram Stain NO WBC SEEN NO ORGANISMS SEEN CYTOSPIN SMEAR   Final   Culture   Final    NO GROWTH 3 DAYS Performed at [facility], [address] [place], [place] [address]    Report Status 07/02/2022 FINAL  Final         Radiology Studies: Overnight EEG with video  Result Date: 07/06/2022 Charlsie Quest, MD     07/07/2022  10:57 AM Patient Name: Dhruvan Satterly MRN: [id number] Epilepsy Attending: Charlsie Quest Referring Physician/Provider: Charlsie Quest, MD Duration: 07/05/2022 1325 to 07/02/2022 4098  Patient history: 67yo M with seizure like activity. EEG to evaluate for seizure.  Level of alertness: Awake, asleep  AEDs during EEG study: TPM, LCM, Clonazepam, Perampanel  Technical aspects: This EEG study was done with scalp electrodes positioned according to the 10-20 International system of electrode placement. Electrical activity was reviewed with band pass filter of 1-70Hz , sensitivity of 7 uV/mm, display speed of 54mm/sec with a 60Hz  notched filter applied as appropriate. EEG data were recorded continuously and digitally stored.  Video monitoring was available and reviewed as appropriate.  Description: The posterior dominant rhythm consists of 9-10 Hz activity of moderate voltage (25-35 uV) seen predominantly in posterior head regions, symmetric and reactive to eye opening and eye closing. Sleep was characterized by vertex waves, sleep spindles (12 to 14 Hz), maximal frontocentral region.  EEG showed continuous  generalized 3-6hz  theta-delta slowing. EEG was not interpretable between 07/05/2022 1854 to 2128 due to significant electrode artifact.  ABNORMALITY - Continuous slow, generalized   IMPRESSION: This study is suggestive of moderate diffuse encephalopathy, non specific etiology. No seizures or epileptiform discharges were seen.  Priyanka Annabelle Harman        Scheduled Meds:  amiodarone  200 mg Oral BID   [START ON 07/09/2022] amiodarone  200 mg Oral Daily   aspirin EC  81 mg Oral Daily   clonazepam  0.25 mg Oral Daily   enoxaparin (LOVENOX) injection  40 mg Subcutaneous Q24H   gemfibrozil  600 mg Oral BID AC   hydrALAZINE  25 mg Oral Q8H   insulin aspart  0-15 Units Subcutaneous TID WC   insulin aspart  0-5 Units Subcutaneous QHS   insulin aspart  4 Units Subcutaneous TID WC   insulin glargine-yfgn  12 Units Subcutaneous Daily   lacosamide  200 mg Oral BID   metoprolol succinate  100 mg Oral Daily   perampanel  4 mg Oral QHS   potassium chloride  40 mEq Oral BID   senna-docusate  2 tablet Oral BID   topiramate  150 mg Oral BID   Continuous Infusions:   LOS: 16 days    Time spent: 40 minutes    Dorcas Carrow, MD [facility] Pager [phone]

## 2022-07-08 DIAGNOSIS — R569 Unspecified convulsions: Secondary | ICD-10-CM | POA: Diagnosis not present

## 2022-07-08 LAB — GLUCOSE, CAPILLARY
Glucose-Capillary: 181 mg/dL — ABNORMAL HIGH (ref 70–99)
Glucose-Capillary: 264 mg/dL — ABNORMAL HIGH (ref 70–99)
Glucose-Capillary: 110 mg/dL — ABNORMAL HIGH (ref 70–99)
Glucose-Capillary: 205 mg/dL — ABNORMAL HIGH (ref 70–99)

## 2022-07-08 LAB — MISC LABCORP TEST (SEND OUT): Labcorp test code: 9985

## 2022-07-08 MED ORDER — ORAL CARE MOUTH RINSE: 15.0000 mL | OROMUCOSAL | Status: DC | PRN

## 2022-07-08 MED ORDER — CLONAZEPAM 0.125 MG PO TBDP
0.2500 mg | ORAL_TABLET | Freq: Every day | ORAL | Status: DC
  Administered 2022-07-08: 0.25 mg via ORAL
  Filled 2022-07-08: qty 2

## 2022-07-08 NOTE — Plan of Care (Signed)
  Problem: Education: Goal: Ability to describe self-care measures that may prevent or decrease complications (Diabetes Survival Skills Education) will improve Outcome: Progressing   

## 2022-07-08 NOTE — Progress Notes (Signed)
Physical Therapy Treatment Patient Details Name: Kyle [name] MRN: [id number] DOB: 1955/06/23 Today's Date: 07/08/2022   History of Present Illness Pt is 67 year old presented to [facility] on 06/20/22 for SVT and visual changes. Neurology consulted and felt likely focal seizures with postictal Todds vision impairment, CT head no acute findings. LP 5/13. 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  PMH - DM, HTN, PAD,    PT Comments    Pt was seen for mobility on RW with help to direct and maintain placement for balance.  Pt is struggling to change his path and turn, but will allow PT to redirect him.  Pt will need<3 hours inpatient therapy a day for recovery of safety, balance and strength/endurance to walk safely again.  PT is focused on goals with acute POC but definite improvement is occurring to allow better rehab options for him.   Recommendations for follow up therapy are one component of a multi-disciplinary discharge planning process, led by the attending physician.  Recommendations may be updated based on patient status, additional functional criteria and insurance authorization.  Follow Up Recommendations  Can patient physically be transported by private vehicle: No    Assistance Recommended at Discharge Frequent or constant Supervision/Assistance  Patient can return home with the following Two people to help with bathing/dressing/bathroom;Assistance with cooking/housework;Assistance with feeding;Direct supervision/assist for medications management;Direct supervision/assist for financial management;Help with stairs or ramp for entrance;Assist for transportation;A lot of help with walking and/or transfers   Equipment Recommendations  Other (comment) (determine in rehab)    Recommendations for Other Services       Precautions / Restrictions Precautions Precautions: Fall;Other (comment) Precaution  Comments: seizures Restrictions Weight Bearing Restrictions: No     Mobility  Bed Mobility Overal bed mobility: Needs Assistance Bed Mobility: Supine to Sit, Sit to Supine     Supine to sit: Mod assist Sit to supine: Mod assist        Transfers Overall transfer level: Needs assistance Equipment used: Rolling walker (2 wheels) Transfers: Sit to/from Stand Sit to Stand: Min assist           General transfer comment: min assist with minor cues for hand placemetn    Ambulation/Gait Ambulation/Gait assistance: Min assist, Mod assist Gait Distance (Feet): 80 Feet (40 x 2) Assistive device: Rolling walker (2 wheels), 1 person hand held assist Gait Pattern/deviations: Step-through pattern, Decreased stride length, Trunk flexed, Wide base of support Gait velocity: reduced Gait velocity interpretation: <1.31 ft/sec, indicative of household ambulator Pre-gait activities: standing balance ck General Gait Details: Pt requires assistance to manage his RW as he tends to step outside the center and then cannot turn well. Mod assist for moments of redirection as pt is fairly strong and requires help to change walker path   Stairs             Wheelchair Mobility    Modified Rankin (Stroke Patients Only)       Balance Overall balance assessment: Needs assistance Sitting-balance support: Feet supported Sitting balance-Leahy Scale: Fair     Standing balance support: Bilateral upper extremity supported, During functional activity Standing balance-Leahy Scale: Poor                              Cognition Arousal/Alertness: Awake/alert Behavior During Therapy: Flat affect Overall Cognitive Status: Impaired/Different from baseline Area of Impairment: Safety/judgement, Attention, Problem solving  Orientation Level: Situation, Place, Time Current Attention Level: Selective Memory: Decreased recall of precautions, Decreased short-term  memory Following Commands: Follows one step commands with increased time Safety/Judgement: Decreased awareness of deficits, Decreased awareness of safety Awareness: Emergent, Intellectual Problem Solving: Slow processing, Requires verbal cues, Requires tactile cues General Comments: continually reminding pt to recenter on walker and to direct safely esp to turn        Exercises      General Comments General comments (skin integrity, edema, etc.): Pt is assisted to get uip to walk with mitts removed, direction of walker needing correction but able to stand and wb effectively on hands      Pertinent Vitals/Pain Pain Assessment Pain Assessment: Faces Faces Pain Scale: No hurt    Home Living                          Prior Function            PT Goals (current goals can now be found in the care plan section) Progress towards PT goals: Progressing toward goals    Frequency    Min 1X/week      PT Plan Current plan remains appropriate    Co-evaluation              AM-PAC PT "6 Clicks" Mobility   Outcome Measure  Help needed turning from your back to your side while in a flat bed without using bedrails?: A Little Help needed moving from lying on your back to sitting on the side of a flat bed without using bedrails?: A Lot Help needed moving to and from a bed to a chair (including a wheelchair)?: A Lot Help needed standing up from a chair using your arms (e.g., wheelchair or bedside chair)?: A Little Help needed to walk in hospital room?: A Lot Help needed climbing 3-5 steps with a railing? : Total 6 Click Score: 13    End of Session Equipment Utilized During Treatment: Gait belt Activity Tolerance: Patient limited by lethargy Patient left: with call bell/phone within reach;in bed;with bed alarm set Nurse Communication: Mobility status PT Visit Diagnosis: Other abnormalities of gait and mobility (R26.89);Muscle weakness (generalized) (M62.81);Other  symptoms and signs involving the nervous system (Z61.096)     Time: 0454-0981 PT Time Calculation (min) (ACUTE ONLY): 20 min  Charges:  $Gait Training: 8-22 mins        Ivar Drape 07/08/2022, 9:12 PM  Samul Dada, PT PhD Acute Rehab Dept. Number: [facility] [id number] and [facility] [phone]

## 2022-07-08 NOTE — Progress Notes (Signed)
PROGRESS NOTE    Kyle [name]  YNW:[id number] DOB: Jul 25, 1955 DOA: 06/20/2022 PCP: No primary care provider on file.    Brief Narrative:  67 year old with history of type 2 diabetes, hypertension, hyperlipidemia peripheral artery disease.  He presented emergency room on 05/19/2022 with complaints of visual disturbances per today's, unremarkable neurological exam, CT head MRI brain and neurological test were normal and was discharged with outpatient follow-up.  He presented back to the emergency room on 5/5 with palpitations, lightheadedness, dizziness and presyncopal episodes.  He was found to be in SVT with heart rate of 177 that self terminated.  In the emergency room, he was found to have seizure-like activity.  Remains in the hospital due to prolonged seizure-like activities, delirium.    Assessment & Plan:   Focal seizures: Noted with seizure-like activity on admission.  Apparently on Topamax before coming to the hospital. MRI brain 5/14 with septal abnormality left occipital lobe. MRI brain 4/3 with a small curvilinear focus susceptibility artifact involving left occipital lobe Continuous EEG that demonstrated seizures. Patient currently on Lacosamide 200 mg twice daily Topamax 150 mg twice daily Perampanel 4 mg daily Clonazepam 2 mg for breakthrough seizures. Developed significant delirium related to medications and underlying seizures, clinically improving today.  Followed by neurology.  Paroxysmal SVT/AVNRT: Presented with SPGR heart rate 170.  TSH normal. Echocardiogram with ejection fraction 55 to 60%.  Grade 1 diastolic dysfunction. Patient was seen by cardiology.  Currently in sinus rhythm.  Remains on metoprolol and amiodarone with plan to taper off amiodarone on 5/24.  AKI: Resolved.  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  Will send referral for urology on  discharge.  Poorly controlled type 2 diabetes with hyperglycemia: Hemoglobin A1c 11.6.  On metformin and glipizide at home.  Currently remains on glargine and SSI.  Discharge planning with insulin.  Hypokalemia: Replaced with oral potassium.  Essential hypertension, hyperaldosteronism and hypokalemia: Home medications including lisinopril and Aldactone were on hold. Currently on metoprolol and hydralazine. Currently on amiodarone.  Continue to work with PT OT.  Monitor in the hospital today.  Anticipate transfer to SNF tomorrow. Patient with improved symptoms, discontinue telemetry monitor and IV line to improve his mobility and decrease the disturbances and agitation.    DVT prophylaxis: enoxaparin (LOVENOX) injection 40 mg Start: 06/28/22 2200   Code Status: Full code Family Communication: wife on the phone, called and updated. Disposition Plan: Status is: Inpatient Remains inpatient appropriate because: Seizure management, needs a SNF     Consultants:  Neurology Cardiology  Procedures:  Lumbar puncture  Antimicrobials:  None   Subjective:  Patient seen and examined.  He looks very comfortable.  He was able to appropriately answer questions.  He still has some difficulty with some expression and repeats some questions.  Otherwise mostly alert.  He did work with Clinical research associate and was able to get out of the bed with help.  Objective: Vitals:   07/07/22 2231 07/08/22 0347 07/08/22 0629 07/08/22 0802  BP: (!) 140/90 (!) 151/78 115/66 137/76  Pulse:  60  60  Resp:  19  16  Temp:  97.7 F (36.5 C)  98.1 F (36.7 C)  TempSrc:  Oral  Oral  SpO2:    98%  Weight:      Height:        Intake/Output Summary (Last 24 hours) at 07/08/2022 1315 Last data filed at 07/07/2022 2208 Gross per 24  hour  Intake 237 ml  Output 550 ml  Net -313 ml    Filed Weights   06/20/22 2210 06/21/22 0320  Weight: 79.4 kg 79.3 kg    Examination:  General: alert, awake , mostly  oriented.  Difficulty expressing. Cardiovascular: S1-S2 normal.  Regular rate rhythm. Respiratory: Bilateral clear.  No added sounds. Gastrointestinal: Soft.  Nontender.  Bowel sound present. Ext: No swelling or edema.  No cyanosis. Neuro: Alert and awake.  Moves all extremities.     Data Reviewed: I have personally reviewed following labs and imaging studies  CBC: Recent Labs  Lab 07/02/22 0356 07/05/22 0341 07/07/22 0135  WBC 7.9 7.9 9.4  HGB 11.8* 13.2 12.3*  HCT 33.7* 38.3* 34.9*  MCV 87.5 87.4 87.0  PLT 278 347 327    Basic Metabolic Panel: Recent Labs  Lab 07/02/22 0652 07/03/22 1131 07/04/22 0236 07/05/22 0340 07/05/22 0341 07/06/22 0212 07/07/22 0135  NA  --  140 136  --  136 138 138  K  --  2.9* 3.6  --  3.4* 4.1 3.2*  CL  --  115* 112*  --  109 114* 110  CO2  --  16* 14*  --  17* 15* 18*  GLUCOSE  --  100* 145*  --  143* 125* 166*  BUN  --  14 12  --  16 20 22   CREATININE  --  0.93 0.91  --  1.02 1.08 1.10  CALCIUM  --  8.9 9.2  --  9.5 9.8 9.7  MG 1.9  --   --  2.0  --   --   --     GFR: Estimated Creatinine Clearance: 66.4 mL/min (by C-G formula based on SCr of 1.1 mg/dL). Liver Function Tests: Recent Labs  Lab 07/02/22 0356 07/05/22 0341 07/06/22 0212 07/07/22 0135  AST 20 32 55* 26  ALT 26 38 40 36  ALKPHOS 94 99 98 88  BILITOT 0.7 0.8 1.4* 0.6  PROT 6.3* 6.7 6.7 6.4*  ALBUMIN 3.2* 3.5 3.5 3.4*    No results for input(s): "LIPASE", "AMYLASE" in the last 168 hours. Recent Labs  Lab 07/04/22 1037 07/05/22 0341 07/06/22 0212  AMMONIA 59* 47* 47*    Coagulation Profile: No results for input(s): "INR", "PROTIME" in the last 168 hours. Cardiac Enzymes: No results for input(s): "CKTOTAL", "CKMB", "CKMBINDEX", "TROPONINI" in the last 168 hours. BNP (last 3 results) No results for input(s): "PROBNP" in the last 8760 hours. HbA1C: No results for input(s): "HGBA1C" in the last 72 hours. CBG: Recent Labs  Lab 07/07/22 1150  07/07/22 1615 07/07/22 2144 07/08/22 0755 07/08/22 1205  GLUCAP 239* 109* 166* 181* 264*    Lipid Profile: No results for input(s): "CHOL", "HDL", "LDLCALC", "TRIG", "CHOLHDL", "LDLDIRECT" in the last 72 hours. Thyroid Function Tests: No results for input(s): "TSH", "T4TOTAL", "FREET4", "T3FREE", "THYROIDAB" in the last 72 hours. Anemia Panel: No results for input(s): "VITAMINB12", "FOLATE", "FERRITIN", "TIBC", "IRON", "RETICCTPCT" in the last 72 hours. Sepsis Labs: No results for input(s): "PROCALCITON", "LATICACIDVEN" in the last 168 hours.  Recent Results (from the past 240 hour(s))  CSF culture w Gram Stain     Status: None   Collection Time: 06/28/22  4:00 PM   Specimen: PATH Cytology CSF; Cerebrospinal Fluid  Result Value Ref Range Status   Specimen Description CSF  Final   Special Requests NONE  Final   Gram Stain NO WBC SEEN NO ORGANISMS SEEN CYTOSPIN SMEAR   Final   Culture  Final    NO GROWTH 3 DAYS Performed at [facility], [address] [place], [place] [address]    Report Status 07/02/2022 FINAL  Final         Radiology Studies: No results found.      Scheduled Meds:  amiodarone  200 mg Oral BID   [START ON 07/09/2022] amiodarone  200 mg Oral Daily   aspirin EC  81 mg Oral Daily   clonazepam  0.25 mg Oral Daily   enoxaparin (LOVENOX) injection  40 mg Subcutaneous Q24H   gemfibrozil  600 mg Oral BID AC   hydrALAZINE  25 mg Oral Q8H   insulin aspart  0-15 Units Subcutaneous TID WC   insulin aspart  0-5 Units Subcutaneous QHS   insulin aspart  4 Units Subcutaneous TID WC   insulin glargine-yfgn  12 Units Subcutaneous Daily   lacosamide  200 mg Oral BID   metoprolol succinate  100 mg Oral Daily   perampanel  4 mg Oral QHS   senna-docusate  2 tablet Oral BID   topiramate  150 mg Oral BID   Continuous Infusions:   LOS: 17 days    Time spent: 35 minutes    Dorcas Carrow, MD [facility] Pager [phone]

## 2022-07-08 NOTE — Progress Notes (Signed)
Occupational Therapy Treatment Patient Details Name: Kyle [name] MRN: [id number] DOB: [birth year] Today's Date: 07/08/2022   History of present illness Pt is 67 year old presented to [facility] on 06/20/22 for SVT and visual changes. Neurology consulted and felt likely focal seizures with postictal Todds vision impairment, CT head no acute findings. LP 5/13. 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  PMH - DM, HTN, PAD,   OT comments  Pt alert and oriented x 4. Able to reposition himself up in bed for self feeding with verbal cues. Pt self fed with set up. Progressing steadily. Continues to be appropriate for post acute rehab <3 hours a day.   Recommendations for follow up therapy are one component of a multi-disciplinary discharge planning process, led by the attending physician.  Recommendations may be updated based on patient status, additional functional criteria and insurance authorization.    Assistance Recommended at Discharge Frequent or constant Supervision/Assistance  Patient can return home with the following  Two people to help with walking and/or transfers;Two people to help with bathing/dressing/bathroom;Assistance with cooking/housework;Assistance with feeding;Direct supervision/assist for medications management;Direct supervision/assist for financial management;Assist for transportation;Help with stairs or ramp for entrance   Equipment Recommendations  Other (comment) (defer to next venue)    Recommendations for Other Services      Precautions / Restrictions Precautions Precautions: Fall;Other (comment) Precaution Comments: seizures       Mobility Bed Mobility               General bed mobility comments: pt pulled himself up in bed in trendelenburg, with head board and knees bent with verbal cues    Transfers                         Balance                                            ADL either performed or assessed with clinical judgement   ADL Overall ADL's : Needs assistance/impaired Eating/Feeding: Set up;Bed level Eating/Feeding Details (indicate cue type and reason): self fed with spoon and milk carton with straw Grooming: Wash/dry hands;Moderate assistance;Bed level Grooming Details (indicate cue type and reason): decreased thoroughness                                    Extremity/Trunk Assessment              Vision       Perception     Praxis      Cognition Arousal/Alertness: Awake/alert Behavior During Therapy: WFL for tasks assessed/performed Overall Cognitive Status: Impaired/Different from baseline Area of Impairment: Safety/judgement, Memory, Problem solving                   Current Attention Level: Sustained Memory: Decreased short-term memory   Safety/Judgement: Decreased awareness of safety, Decreased awareness of deficits Awareness: Emergent Problem Solving: Slow processing, Decreased initiation          Exercises      Shoulder Instructions       General Comments      Pertinent Vitals/ Pain       Pain Assessment Pain Assessment: Faces Faces Pain Scale: Hurts a little bit Pain Location: buttocks Pain  Descriptors / Indicators: Discomfort, Restless Pain Intervention(s): Monitored during session  Home Living                                          Prior Functioning/Environment              Frequency  Min 2X/week        Progress Toward Goals  OT Goals(current goals can now be found in the care plan section)  Progress towards OT goals: Progressing toward goals  Acute Rehab OT Goals OT Goal Formulation: Patient unable to participate in goal setting Time For Goal Achievement: 07/12/22 Potential to Achieve Goals: Good  Plan Discharge plan remains appropriate    Co-evaluation                 AM-PAC OT "6 Clicks" Daily  Activity     Outcome Measure   Help from another person eating meals?: A Little Help from another person taking care of personal grooming?: A Lot Help from another person toileting, which includes using toliet, bedpan, or urinal?: Total Help from another person bathing (including washing, rinsing, drying)?: Total Help from another person to put on and taking off regular upper body clothing?: Total Help from another person to put on and taking off regular lower body clothing?: Total 6 Click Score: 9    End of Session        Activity Tolerance     Patient Left     Nurse Communication          Time: 1610-9604 OT Time Calculation (min): 27 min  Charges: OT General Charges $OT Visit: 1 Visit OT Treatments $Self Care/Home Management : 23-37 mins  Berna Spare, OTR/L Acute Rehabilitation Services Office: [phone]   Evern Bio 07/08/2022, 9:32 AM

## 2022-07-08 NOTE — Progress Notes (Signed)
Mobility Specialist Progress Note:   07/08/22 1130  Mobility  Activity Ambulated with assistance in hallway  Level of Assistance Moderate assist, patient does 50-74%  Assistive Device Front wheel walker  Distance Ambulated (ft) 60 ft  Activity Response Tolerated well  Mobility Referral Yes  $Mobility charge 1 Mobility  Mobility Specialist Start Time (ACUTE ONLY) 1130  Mobility Specialist Stop Time (ACUTE ONLY) 1147  Mobility Specialist Time Calculation (min) (ACUTE ONLY) 17 min   Pt able to ambulate with min-modA. Required up to modA to steady with x2 mild LOBs and a posterior lean. Pt left sitting in chair at sink to get bath from NT.  Returned to assist pt to recliner. Required minA to stand, and minA to take pivotal steps to recliner. Left with all needs met, chair alarm on.  Addison Lank Mobility Specialist Please contact via SecureChat or  Rehab office at [phone]

## 2022-07-09 DIAGNOSIS — R569 Unspecified convulsions: Secondary | ICD-10-CM | POA: Diagnosis not present

## 2022-07-09 LAB — GLUCOSE, CAPILLARY
Glucose-Capillary: 182 mg/dL — ABNORMAL HIGH (ref 70–99)
Glucose-Capillary: 287 mg/dL — ABNORMAL HIGH (ref 70–99)

## 2022-07-09 MED ORDER — INSULIN ASPART 100 UNIT/ML IJ SOLN: 4.0000 [IU] | Freq: Three times a day (TID) | INTRAMUSCULAR | 11 refills | Status: DC

## 2022-07-09 MED ORDER — HYDRALAZINE HCL 25 MG PO TABS: 25.0000 mg | ORAL_TABLET | Freq: Three times a day (TID) | ORAL | Status: DC

## 2022-07-09 MED ORDER — CLONAZEPAM 0.25 MG PO TBDP: 0.2500 mg | ORAL_TABLET | Freq: Every day | ORAL | 0 refills | Status: DC

## 2022-07-09 MED ORDER — CLONAZEPAM 2 MG PO TABS: 2.0000 mg | ORAL_TABLET | Freq: Three times a day (TID) | ORAL | 0 refills | Status: DC | PRN

## 2022-07-09 MED ORDER — AMIODARONE HCL 200 MG PO TABS: 200.0000 mg | ORAL_TABLET | Freq: Every day | ORAL | Status: DC

## 2022-07-09 MED ORDER — INSULIN GLARGINE-YFGN 100 UNIT/ML ~~LOC~~ SOLN: 12.0000 [IU] | Freq: Every day | SUBCUTANEOUS | 11 refills | Status: DC

## 2022-07-09 MED ORDER — LACOSAMIDE 200 MG PO TABS: 200.0000 mg | ORAL_TABLET | Freq: Two times a day (BID) | ORAL | 0 refills | Status: DC

## 2022-07-09 MED ORDER — SENNOSIDES-DOCUSATE SODIUM 8.6-50 MG PO TABS: 2.0000 | ORAL_TABLET | Freq: Two times a day (BID) | ORAL | Status: DC

## 2022-07-09 MED ORDER — METOPROLOL SUCCINATE ER 100 MG PO TB24
100.0000 mg | ORAL_TABLET | Freq: Every day | ORAL | 0 refills | Status: DC
Start: 2022-07-09 — End: 2022-09-13

## 2022-07-09 MED ORDER — PERAMPANEL 4 MG PO TABS: 4.0000 mg | ORAL_TABLET | Freq: Every day | ORAL | 0 refills | Status: DC

## 2022-07-09 MED ORDER — TOPIRAMATE 50 MG PO TABS: 150.0000 mg | ORAL_TABLET | Freq: Two times a day (BID) | ORAL | 0 refills | Status: DC

## 2022-07-09 NOTE — Discharge Summary (Signed)
Physician Discharge Summary  Kyle [name] ZOX:[id number] DOB: [birth year] DOA: 06/20/2022  PCP: No primary care provider on file.  Admit date: 06/20/2022 Discharge date: 07/09/2022  Admitted From: Home Disposition: Skilled nursing facility  Recommendations for Outpatient Follow-up:  Follow up with PCP in 1-2 weeks Please obtain BMP/CBC/magnesium/phosphorus in 1 week Neurology will schedule follow-up Schedule follow-up with urology, call if you do not hear from the office in 1 week.   Discharge Condition: Stable CODE STATUS: Full code Diet recommendation: Low-salt and low-carb diet  Discharge summary: 67 year old with history of type 2 diabetes, hypertension, hyperlipidemia, peripheral artery disease.  He presented emergency room on 05/19/2022 with complaints of visual disturbances for 2 days, unremarkable neurological exam, CT head MRI brain and neurological test were normal and was discharged with outpatient follow-up.  He presented back to the emergency room on 5/5 with palpitations, lightheadedness, dizziness and presyncopal episodes.  He was found to be in SVT with heart rate of 177 that self terminated.  In the emergency room, he was found to have seizure-like activity.  Remained in the hospital due to prolonged seizure-like activities, delirium.  Stabilizing now.  Able to go to skilled nursing rehab today. On further neoplastic workup, he was found to have right kidney mass.  Referred to urology for follow-up in the office.     Assessment & plan of care:   Focal seizures: Noted with seizure-like activity on admission.  Apparently on Topamax before coming to the hospital. MRI brain 5/14 with septal abnormality left occipital lobe. MRI brain 4/3 with a small curvilinear focus susceptibility artifact involving left occipital lobe Continuous EEG that demonstrated seizures. Patient currently on Lacosamide 200 mg twice daily Topamax 150 mg twice daily Perampanel 4 mg daily Klonopin  0.25 mg at bedtime as a scheduled doses. Klonopin 2 mg for breakthrough seizures. Developed significant delirium related to medications and underlying seizures, clinically improving today.  Followed by neurology.  They will schedule outpatient follow-up.   Paroxysmal SVT/AVNRT: Presented with SPGR heart rate 170.  TSH normal. Echocardiogram with ejection fraction 55 to 60%.  Grade 1 diastolic dysfunction. Patient was seen by cardiology.  Currently in sinus rhythm.  Remains on metoprolol and amiodarone that will be continued.  On maintenance amiodarone doses today.    AKI: Resolved.   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  Will send referral for urology on discharge. Case updated Urology on call Dr Lafonda Mosses , office will call patient to schedule follow up.    Poorly controlled type 2 diabetes with hyperglycemia: Hemoglobin A1c 11.6.  On metformin and glipizide at home.  Currently remains on glargine and SSI.  Will discharge with insulin.  Resume metformin.   Hypokalemia: Replaced with oral potassium.  Adequate.   Essential hypertension, hyperaldosteronism and hypokalemia: Home medications including lisinopril and Aldactone were discussed Nute. Currently on metoprolol and hydralazine. Currently on amiodarone.  Blood pressure stable.   Remains medically stable today to transfer to skilled level of care.      Discharge Diagnoses:  Principal Problem:   Focal seizures (HCC) Active Problems:   Hypertension   Diabetes (HCC)   Hyperlipidemia   PAD (peripheral artery disease) (HCC)   Seizure (HCC)   Migraines   SVT (supraventricular tachycardia)   Visual disturbances   Statin intolerance    Discharge Instructions  Discharge Instructions     Ambulatory referral to Neurology   Complete by: As directed  An appointment is requested in approximately: 4 weeks   Diet - low sodium  heart healthy   Complete by: As directed    Increase activity slowly   Complete by: As directed       Allergies as of 07/09/2022       Reactions   Statins    Leg cramps   Gabapentin Palpitations        Medication List     STOP taking these medications    glipiZIDE 10 MG tablet Commonly known as: GLUCOTROL   HYDROcodone-acetaminophen 5-325 MG tablet Commonly known as: Norco   labetalol 200 MG tablet Commonly known as: NORMODYNE   lisinopril 40 MG tablet Commonly known as: ZESTRIL   spironolactone 50 MG tablet Commonly known as: ALDACTONE       TAKE these medications    amiodarone 200 MG tablet Commonly known as: PACERONE Take 1 tablet (200 mg total) by mouth daily. Start taking on: Jul 10, 2022   aspirin 81 MG tablet Take 81 mg by mouth daily.   b complex vitamins tablet Take 1 tablet by mouth daily.   clonazePAM 0.25 MG disintegrating tablet Commonly known as: KLONOPIN Take 1 tablet (0.25 mg total) by mouth at bedtime.   clonazePAM 2 MG tablet Commonly known as: KlonoPIN Take 1 tablet (2 mg total) by mouth 3 (three) times daily as needed (as needed for sizure only).   gemfibrozil 600 MG tablet Commonly known as: LOPID Take 1 tablet (600 mg total) by mouth 2 (two) times daily before a meal.   glucose blood test strip Use as instructed What changed:  how much to take how to take this when to take this   hydrALAZINE 25 MG tablet Commonly known as: APRESOLINE Take 1 tablet (25 mg total) by mouth every 8 (eight) hours. What changed:  medication strength how much to take how to take this when to take this additional instructions   insulin aspart 100 UNIT/ML injection Commonly known as: novoLOG Inject 4 Units into the skin 3 (three) times daily with meals.   insulin glargine-yfgn 100 UNIT/ML injection Commonly known as: SEMGLEE Inject 0.12 mLs (12 Units total) into the skin daily. Start taking on: Jul 10, 2022   lacosamide 200 MG Tabs  tablet Commonly known as: VIMPAT Take 1 tablet (200 mg total) by mouth 2 (two) times daily.   Lancets 30G Misc Use to check home glucose daily and as needed What changed:  how much to take how to take this when to take this additional instructions   metFORMIN 500 MG tablet Commonly known as: GLUCOPHAGE Take 2 tablets (1,000 mg total) by mouth 2 (two) times daily with a meal.   metoprolol succinate 100 MG 24 hr tablet Commonly known as: TOPROL-XL Take 1 tablet (100 mg total) by mouth daily. Take with or immediately following a meal. What changed:  medication strength how much to take   Perampanel 4 MG Tabs Take 1 tablet (4 mg total) by mouth at bedtime.   senna-docusate 8.6-50 MG tablet Commonly known as: Senokot-S Take 2 tablets by mouth 2 (two) times daily.   topiramate 50 MG tablet Commonly known as: TOPAMAX Take 3 tablets (150 mg total) by mouth 2 (two) times daily. What changed:  medication strength how much to take        Contact information for follow-up providers     Alver Sorrow, NP Follow up.   Specialty: Cardiology Why: Since you were seen by cardiology team here  in the hospital, we moved out your follow-up appointment to allow time for completion of a heart monitor after discharge. Follow-up has been arranged with Gillian Shields, nurse practitioner, in our [facility] location of [facility] on Monday August 09, 2022 at 10:05 AM (Arrive by 9:50 AM). Contact information: [address] [place] [address] [phone]         [facility] Follow up.   Why: The cardiologist you saw, Dr. Duke Salvia, also recommended arranging a follow-up appointment with our lipid clinic for your cholesterol. The office will call you to set this up. Contact information: [phone]        [facility]. Schedule an appointment as soon as possible for a visit in 4 week(s).   Why: for further work up of the  renal mass. Contact information: [address] [place] [address] [phone]             Contact information for after-discharge care     Destination     [facility] Preferred SNF .   Service: Skilled Nursing Contact information: [address] [place] [address] [phone]                    Allergies  Allergen Reactions   Statins     Leg cramps   Gabapentin Palpitations    Consultations: Neurology Urology , phone discussion to schedule follow up.    Procedures/Studies: Overnight EEG with video  Result Date: 07/06/2022 Charlsie Quest, MD     07/07/2022 10:57 AM Patient Name: Kyle [name] MRN: [id number] Epilepsy Attending: Charlsie Quest Referring Physician/Provider: Charlsie Quest, MD Duration: 07/05/2022 1325 to 07/02/2022 5366  Patient history: 67yo M with seizure like activity. EEG to evaluate for seizure.  Level of alertness: Awake, asleep  AEDs during EEG study: TPM, LCM, Clonazepam, Perampanel  Technical aspects: This EEG study was done with scalp electrodes positioned according to the 10-20 International system of electrode placement. Electrical activity was reviewed with band pass filter of 1-70Hz , sensitivity of 7 uV/mm, display speed of 62mm/sec with a 60Hz  notched filter applied as appropriate. EEG data were recorded continuously and digitally stored.  Video monitoring was available and reviewed as appropriate.  Description: The posterior dominant rhythm consists of 9-10 Hz activity of moderate voltage (25-35 uV) seen predominantly in posterior head regions, symmetric and reactive to eye opening and eye closing. Sleep was characterized by vertex waves, sleep spindles (12 to 14 Hz), maximal frontocentral region.  EEG showed continuous generalized 3-6hz  theta-delta slowing. EEG was not interpretable between 07/05/2022 1854 to 2128 due to significant electrode artifact.  ABNORMALITY - Continuous  slow, generalized   IMPRESSION: This study is suggestive of moderate diffuse encephalopathy, non specific etiology. No seizures or epileptiform discharges were seen.  Charlsie Quest   MR BRAIN W WO CONTRAST  Result Date: 06/29/2022 CLINICAL DATA:  67 year old male with evidence of right renal malignancy. Intractable seizure. Metastatic disease evaluation. EXAM: MRI HEAD WITHOUT AND WITH CONTRAST TECHNIQUE: Multiplanar, multiecho pulse sequences of the brain and surrounding structures were obtained without and with intravenous contrast. CONTRAST:  8mL GADAVIST GADOBUTROL 1 MMOL/ML IV SOLN COMPARISON:  Lumbar puncture and Head CT yesterday. Brain MRI without contrast 05/19/2022. FINDINGS: Brain: Subtle abnormal diffusion in the left occipital lobe series 5, images 72-74. This is less apparent on coronal DWI, and might be secondary to abnormality of the sulci rather than the cortex here.  Negative noncontrast CT appearance of this area yesterday. No associated abnormal enhancement, dural or leptomeningeal thickening. No definite gyral edema but mildly heterogeneous FLAIR signal on series 11, image 8. SWI appears negative here today. However, there is conspicuously decreased white matter T2 hypointensity in the left occipital lobe. See series 10 images 9-11, also coronal series 17, image 5. Where as bilateral cerebral white matter T2 signal appears fairly symmetric elsewhere in the brain. Elsewhere Curvilinear SWI was demonstrated near this area last month. But was in the more superior occipital lobe at that time. No other abnormal diffusion. No midline shift, mass effect, evidence of mass lesion, ventriculomegaly. Cervicomedullary junction and pituitary are within normal limits. Normal basilar cisterns. No evidence of intraventricular blood or debris. Normal background gray and white matter signal for age. No abnormal enhancement identified. audio healer Vascular: Major intracranial vascular flow voids are  preserved. Major dural venous sinuses are enhancing following contrast and appear to be patent. Skull and upper cervical spine: Nonspecific decreased T1 marrow signal in the central C3 vertebral body partially identified, present last month. Other visible bone marrow signal is within normal limits. Sinuses/Orbits: Stable, orbits appear negative. Mild paranasal sinus mucosal thickening has increased from last month. Other: Visible internal auditory structures appear normal. Benign-appearing suboccipital scalp lipoma in the midline. IMPRESSION: 1. Subtle abnormality again affecting the left occipital lobe, with mildly heterogeneous diffusion and FLAIR, but also a larger area of asymmetrically decreased T2 white matter signal. Favor Non-ketotic Hyperglycemic Occipital Seizure. 2. No evidence of intracranial metastatic disease, and otherwise negative for age MRI appearance of the brain. 3. Indeterminate sclerotic lesion of the C3 vertebral body. Electronically Signed   By: Odessa Fleming M.D.   On: 06/29/2022 11:36   CT HEAD WO CONTRAST ( )  Result Date: 06/28/2022 CLINICAL DATA:  Initial evaluation for subarachnoid hemorrhage, xanthochromia on CSF. EXAM: CT HEAD WITHOUT CONTRAST TECHNIQUE: Contiguous axial images were obtained from the base of the skull through the vertex without intravenous contrast. RADIATION DOSE REDUCTION: This exam was performed according to the departmental dose-optimization program which includes automated exposure control, adjustment of the mA and/or kV according to patient size and/or use of iterative reconstruction technique. COMPARISON:  Prior Study from 05/19/2022. FINDINGS: Brain: Cerebral volume within normal limits. Mild cerebral white matter disease, most likely related to chronic small vessel ischemic disease. No acute intracranial hemorrhage. No acute large vessel territory infarct. No mass lesion, midline shift or mass effect. No hydrocephalus or extra-axial fluid collection. Few  small foci of pneumocephalus noted, suspected to be related to recent lumbar puncture. Vascular: No abnormal hyperdense vessel. Scattered vascular calcifications noted within the carotid siphons. Skull: Scalp soft tissues demonstrate no acute finding. Calvarium intact. Sinuses/Orbits: Globes and orbital soft tissues within normal limits. Scattered mucosal thickening present about the ethmoidal air cells. Mild-to-moderate mucosal thickening with pneumatized secretions about the right maxillary sinus. No mastoid effusion. Other: None. IMPRESSION: 1. No acute intracranial abnormality. 2. Few small foci of pneumocephalus, likely secondary to recent lumbar puncture. 3. Mild cerebral white matter disease, most likely related to chronic small vessel ischemic disease. 4. Right maxillary sinusitis. Electronically Signed   By: Rise Mu M.D.   On: 06/28/2022 22:37   DG FL GUIDED LUMBAR PUNCTURE  Result Date: 06/28/2022 CLINICAL DATA:  Patient with intractable seizures. Request is for lumbar puncture for further evaluation EXAM: LUMBAR PUNCTURE UNDER FLUOROSCOPY PROCEDURE: An appropriate skin entry site was determined fluoroscopically. Operator donned sterile gloves and mask. Skin site was marked,  then prepped with Betadine, draped in usual sterile fashion, and infiltrated locally with 1% lidocaine. A 18 gauge spinal needle advanced into the thecal sac at lL3- L4 from a laterality interlaminar approach. Pink tinged turning to clear colorless CSF spontaneously returned. 5.5 ml CSF were collected and divided among 4 sterile vials for the requested laboratory studies. Opening pressure could not be measured but was low. The needle was then removed. The patient tolerated the procedure well and there were no complications. FLUOROSCOPY: Radiation Exposure Index (as provided by the fluoroscopic device): 70.2 mGy Kerma IMPRESSION: Technically successful lumbar puncture under fluoroscopy. Low opening pressure, too low to  be measured directly. Due to limited CSF return, only 5.5 cc could be obtained. This exam was performed by Anders Grant under the direct supervised and interpreted by Dr. Jeronimo Greaves Electronically Signed   By: Jeronimo Greaves M.D.   On: 06/28/2022 17:00   CT CHEST ABDOMEN PELVIS W CONTRAST  Result Date: 06/27/2022 CLINICAL DATA:  Occult malignancy EXAM: CT CHEST, ABDOMEN, AND PELVIS WITH CONTRAST TECHNIQUE: Multidetector CT imaging of the chest, abdomen and pelvis was performed following the standard protocol during bolus administration of intravenous contrast. RADIATION DOSE REDUCTION: This exam was performed according to the departmental dose-optimization program which includes automated exposure control, adjustment of the mA and/or kV according to patient size and/or use of iterative reconstruction technique. CONTRAST:  75mL OMNIPAQUE IOHEXOL 350 MG/ML SOLN COMPARISON:  None Available. FINDINGS: CT CHEST FINDINGS Cardiovascular: No significant vascular findings. Normal heart size. No pericardial effusion. There are atherosclerotic calcifications of the aorta. Mediastinum/Nodes: There is a mildly enlarged right hilar lymph node measuring 1 cm short axis. No other enlarged mediastinal, hilar, or axillary lymph nodes. Thyroid gland, trachea, and esophagus demonstrate no significant findings. Lungs/Pleura: Lungs are clear. No pleural effusion or pneumothorax. Musculoskeletal: No chest wall mass or suspicious bone lesions identified. CT ABDOMEN PELVIS FINDINGS Hepatobiliary: Gallstones are present. There is no biliary ductal dilatation. The liver is within normal limits. Pancreas: Unremarkable. No pancreatic ductal dilatation or surrounding inflammatory changes. Spleen: Normal in size without focal abnormality. Adrenals/Urinary Tract: There is a heterogeneously enhancing mass in the right kidney measuring 2.0 x 2.3 cm. There is an indeterminate hypodense area measuring 43 Hounsfield units in the left kidney  measuring 2.5 cm in diameter. There is no hydronephrosis or perinephric fat stranding. The adrenal glands and bladder are within normal limits. Stomach/Bowel: Stomach is within normal limits. Appendix appears normal. No evidence of bowel wall thickening, distention, or inflammatory changes. There is sigmoid colon diverticulosis. The rectum is distended with a large amount of stool. There is a large amount of stool throughout the entire colon. Vascular/Lymphatic: Aortic atherosclerosis. No enlarged abdominal or pelvic lymph nodes. Reproductive: Prostate is unremarkable. Other: Presacral edema is present. There is trace free fluid in the left lower quadrant. There is no focal abdominal wall hernia. Musculoskeletal: No focal osseous lesion. Degenerative changes affect the spine. IMPRESSION: 1. Heterogeneously enhancing mass in the right kidney worrisome for renal cell carcinoma. 2. Indeterminate hypodense lesion in the left kidney. Recommend further evaluation with renal CT or MRI. 3. Mildly enlarged right hilar lymph node. No other evidence for lymphadenopathy in the chest, abdomen or pelvis. 4. Cholelithiasis. 5. Large amount of stool distends the rectum with presacral edema. 6. Trace free fluid in the left lower quadrant. Aortic Atherosclerosis (ICD10-I70.0). Electronically Signed   By: Darliss Cheney M.D.   On: 06/27/2022 21:13   ECHOCARDIOGRAM COMPLETE  Result Date: 06/22/2022  ECHOCARDIOGRAM REPORT   Patient Name:   Kyle [name] Date of Exam: 06/22/2022 Medical Rec #:  [id number]         Height:       70.0 in Accession #:    [id number]        Weight:       174.8 lb Date of Birth:  09/16/1955         BSA:          1.971 m Patient Age:    66 years          BP:           115/75 mmHg Patient Gender: M                 HR:           75 bpm. Exam Location:  Inpatient Procedure: 2D Echo, Cardiac Doppler and Color Doppler Indications:    I47.1 SVT  History:        Patient has prior history of Echocardiogram  examinations. Risk                 Factors:Hypertension, Diabetes and Dyslipidemia.  Sonographer:    Mike Gip Referring Phys: [phone] ANAND D HONGALGI IMPRESSIONS  1. Left ventricular ejection fraction, by estimation, is 55 to 60%. Left ventricular ejection fraction by 2D MOD biplane is 56.7 %. The left ventricle has normal function. The left ventricle has no regional wall motion abnormalities. Left ventricular diastolic parameters are consistent with Grade I diastolic dysfunction (impaired relaxation).  2. Right ventricular systolic function is normal. The right ventricular size is normal. There is normal pulmonary artery systolic pressure. The estimated right ventricular systolic pressure is 19.0 mmHg.  3. The mitral valve is grossly normal. Trivial mitral valve regurgitation. No evidence of mitral stenosis.  4. The aortic valve is tricuspid. Aortic valve regurgitation is not visualized. No aortic stenosis is present.  5. The inferior vena cava is normal in size with greater than 50% respiratory variability, suggesting right atrial pressure of 3 mmHg. FINDINGS  Left Ventricle: Left ventricular ejection fraction, by estimation, is 55 to 60%. Left ventricular ejection fraction by 2D MOD biplane is 56.7 %. The left ventricle has normal function. The left ventricle has no regional wall motion abnormalities. The left ventricular internal cavity size was normal in size. There is no left ventricular hypertrophy. Left ventricular diastolic parameters are consistent with Grade I diastolic dysfunction (impaired relaxation). Right Ventricle: The right ventricular size is normal. No increase in right ventricular wall thickness. Right ventricular systolic function is normal. There is normal pulmonary artery systolic pressure. The tricuspid regurgitant velocity is 2.00 m/s, and  with an assumed right atrial pressure of 3 mmHg, the estimated right ventricular systolic pressure is 19.0 mmHg. Left Atrium: Left atrial size was  normal in size. Right Atrium: Right atrial size was normal in size. Pericardium: There is no evidence of pericardial effusion. Mitral Valve: The mitral valve is grossly normal. Trivial mitral valve regurgitation. No evidence of mitral valve stenosis. Tricuspid Valve: The tricuspid valve is grossly normal. Tricuspid valve regurgitation is trivial. No evidence of tricuspid stenosis. Aortic Valve: The aortic valve is tricuspid. Aortic valve regurgitation is not visualized. No aortic stenosis is present. Pulmonic Valve: The pulmonic valve was grossly normal. Pulmonic valve regurgitation is not visualized. No evidence of pulmonic stenosis. Aorta: The aortic root is normal in size and structure. Venous: The inferior vena cava is normal in size with greater than 50%  respiratory variability, suggesting right atrial pressure of 3 mmHg. IAS/Shunts: The atrial septum is grossly normal.  LEFT VENTRICLE PLAX 2D                        Biplane EF (MOD) LVIDd:         4.30 cm         LV Biplane EF:   Left LVIDs:         3.10 cm                          ventricular LV PW:         0.90 cm                          ejection LV IVS:        0.90 cm                          fraction by LVOT diam:     2.00 cm                          2D MOD LV SV:         46                               biplane is LV SV Index:   23                               56.7 %. LVOT Area:     3.14 cm                                Diastology                                LV e' medial:    7.07 cm/s LV Volumes (MOD)               LV E/e' medial:  4.8 LV vol d, MOD    104.0 ml      LV e' lateral:   9.46 cm/s A2C:                           LV E/e' lateral: 3.6 LV vol d, MOD    122.0 ml A4C: LV vol s, MOD    45.1 ml A2C: LV vol s, MOD    52.3 ml A4C: LV SV MOD A2C:   58.9 ml LV SV MOD A4C:   122.0 ml LV SV MOD BP:    64.9 ml RIGHT VENTRICLE             IVC RV Basal diam:  3.80 cm     IVC diam: 1.60 cm RV S prime:     11.50 cm/s TAPSE (M-mode): 1.9 cm LEFT ATRIUM              Index        RIGHT ATRIUM           Index LA diam:        3.30 cm 1.67 cm/m  RA Area:     16.70 cm LA Vol (A2C):   35.7 ml 18.11 ml/m  RA Volume:   43.80 ml  22.22 ml/m LA Vol (A4C):   36.8 ml 18.67 ml/m LA Biplane Vol: 38.8 ml 19.68 ml/m  AORTIC VALVE LVOT Vmax:   85.00 cm/s LVOT Vmean:  53.100 cm/s LVOT VTI:    0.147 m  AORTA Ao Root diam: 3.10 cm MITRAL VALVE               TRICUSPID VALVE MV Area (PHT): 2.11 cm    TR Peak grad:   16.0 mmHg MV Decel Time: 360 msec    TR Vmax:        200.00 cm/s MV E velocity: 33.80 cm/s MV A velocity: 69.40 cm/s  SHUNTS MV E/A ratio:  0.49        Systemic VTI:  0.15 m                            Systemic Diam: 2.00 cm Lennie Odor MD Electronically signed by Lennie Odor MD Signature Date/Time: 06/22/2022/2:01:42 PM    Final    Overnight EEG with video  Result Date: 06/22/2022 Charlsie Quest, MD     06/22/2022 11:01 AM Patient Name: Kyle [name] MRN: [id number] Epilepsy Attending: Charlsie Quest Referring Physician/Provider: Gordy Councilman, MD Duration: 06/21/2022 0848 to 06/22/2022 0848 Patient history: 67yo M with seizure like activity. EEG to evaluate for seizure. Level of alertness: Awake, asleep AEDs during EEG study: LEV Technical aspects: This EEG study was done with scalp electrodes positioned according to the 10-20 International system of electrode placement. Electrical activity was reviewed with band pass filter of 1-70Hz , sensitivity of 7 uV/mm, display speed of 14mm/sec with a 60Hz  notched filter applied as appropriate. EEG data were recorded continuously and digitally stored.  Video monitoring was available and reviewed as appropriate. Description: The posterior dominant rhythm consists of 9-10 Hz activity of moderate voltage (25-35 uV) seen predominantly in posterior head regions, symmetric and reactive to eye opening and eye closing. Sleep was characterized by vertex waves, sleep spindles (12 to 14 Hz), maximal frontocentral region.  Seizures  without clinical signs were noted arising from left posterior quadrant.  At the onset, EEG showed low amplitude 15 to 16 Hz beta activity in left posterior quadrant which then evolved into 9 to 10 Hz alpha activity followed by involving all of left hemisphere and evolving into 3 to 5 Hz theta-delta slowing.  Average 3 seizures were noted every hour, lasting about 2 to 3 minutes each. Hyperventilation and photic stimulation were not performed.   ABNORMALITY -Seizure without clinical signs, left posterior quadrant IMPRESSION: This study showed seizures without clinical signs arising from left posterior quadrant, average 3 seizures per hour, lasting about 2 to 3 minutes each. Priyanka O Yadav   (Echo, Carotid, EGD, Colonoscopy, ERCP)    Subjective: Patient seen and examined.  He was working with Clinical research associate and moving around in the hallway.  Mostly alert awake and interactive.  Patient had slight difficulty maneuvering or turning around with a walker.  He was with steady gait on a walker.  No overnight events.   Discharge Exam: Vitals:   07/08/22 2045 07/09/22 0335  BP:  136/75  Pulse: (!) 58 (!) 57  Resp: 18 20  Temp: 97.6 F (36.4 C) 97.7 F (36.5 C)  SpO2: 100% 100%   Vitals:   07/08/22 0629 07/08/22 0802  07/08/22 2045 07/09/22 0335  BP: 115/66 137/76  136/75  Pulse:  60 (!) 58 (!) 57  Resp:  16 18 20   Temp:  98.1 F (36.7 C) 97.6 F (36.4 C) 97.7 F (36.5 C)  TempSrc:  Oral Oral Axillary  SpO2:  98% 100% 100%  Weight:      Height:        General: Pt is alert, awake, not in acute distress Patient is alert awake and mostly oriented.  Still has some difficulty expressing words.  He does not have any focal logical deficits. Cardiovascular: RRR, S1/S2 +, no rubs, no gallops Respiratory: CTA bilaterally, no wheezing, no rhonchi Abdominal: Soft, NT, ND, bowel sounds + Extremities: no edema, no cyanosis    The results of significant diagnostics from this hospitalization  (including imaging, microbiology, ancillary and laboratory) are listed below for reference.     Microbiology: No results found for this or any previous visit (from the past 240 hour(s)).   Labs: BNP (last 3 results) No results for input(s): "BNP" in the last 8760 hours. Basic Metabolic Panel: Recent Labs  Lab 07/03/22 1131 07/04/22 0236 07/05/22 0340 07/05/22 0341 07/06/22 0212 07/07/22 0135  NA 140 136  --  136 138 138  K 2.9* 3.6  --  3.4* 4.1 3.2*  CL 115* 112*  --  109 114* 110  CO2 16* 14*  --  17* 15* 18*  GLUCOSE 100* 145*  --  143* 125* 166*  BUN 14 12  --  16 20 22   CREATININE 0.93 0.91  --  1.02 1.08 1.10  CALCIUM 8.9 9.2  --  9.5 9.8 9.7  MG  --   --  2.0  --   --   --    Liver Function Tests: Recent Labs  Lab 07/05/22 0341 07/06/22 0212 07/07/22 0135  AST 32 55* 26  ALT 38 40 36  ALKPHOS 99 98 88  BILITOT 0.8 1.4* 0.6  PROT 6.7 6.7 6.4*  ALBUMIN 3.5 3.5 3.4*   No results for input(s): "LIPASE", "AMYLASE" in the last 168 hours. Recent Labs  Lab 07/04/22 1037 07/05/22 0341 07/06/22 0212  AMMONIA 59* 47* 47*   CBC: Recent Labs  Lab 07/05/22 0341 07/07/22 0135  WBC 7.9 9.4  HGB 13.2 12.3*  HCT 38.3* 34.9*  MCV 87.4 87.0  PLT 347 327   Cardiac Enzymes: No results for input(s): "CKTOTAL", "CKMB", "CKMBINDEX", "TROPONINI" in the last 168 hours. BNP: Invalid input(s): "POCBNP" CBG: Recent Labs  Lab 07/08/22 0755 07/08/22 1205 07/08/22 1622 07/08/22 2049 07/09/22 0750  GLUCAP 181* 264* 110* 205* 182*   D-Dimer No results for input(s): "DDIMER" in the last 72 hours. Hgb A1c No results for input(s): "HGBA1C" in the last 72 hours. Lipid Profile No results for input(s): "CHOL", "HDL", "LDLCALC", "TRIG", "CHOLHDL", "LDLDIRECT" in the last 72 hours. Thyroid function studies No results for input(s): "TSH", "T4TOTAL", "T3FREE", "THYROIDAB" in the last 72 hours.  Invalid input(s): "FREET3" Anemia work up No results for input(s):  "VITAMINB12", "FOLATE", "FERRITIN", "TIBC", "IRON", "RETICCTPCT" in the last 72 hours. Urinalysis    Component Value Date/Time   COLORURINE STRAW (A) 05/19/2022 1510   APPEARANCEUR CLEAR 05/19/2022 1510   LABSPEC 1.006 05/19/2022 1510   PHURINE 5.0 05/19/2022 1510   GLUCOSEU >=500 (A) 05/19/2022 1510   HGBUR NEGATIVE 05/19/2022 1510   BILIRUBINUR NEGATIVE 05/19/2022 1510   BILIRUBINUR Negative 09/21/2014 1003   KETONESUR NEGATIVE 05/19/2022 1510   PROTEINUR NEGATIVE 05/19/2022 1510  UROBILINOGEN 0.2 09/21/2014 1003   NITRITE NEGATIVE 05/19/2022 1510   LEUKOCYTESUR NEGATIVE 05/19/2022 1510   Sepsis Labs Recent Labs  Lab 07/05/22 0341 07/07/22 0135  WBC 7.9 9.4   Microbiology No results found for this or any previous visit (from the past 240 hour(s)).   Time coordinating discharge: 35 minutes  SIGNED:   Dorcas Carrow, MD  [facility] 07/09/2022, 10:43 AM

## 2022-07-09 NOTE — Progress Notes (Signed)
Report given to Therapist, sports at [facility].

## 2022-07-09 NOTE — TOC Transition Note (Addendum)
Transition of Care [facility]) - CM/SW Discharge Note   Patient Details  Name: Kyle [name] MRN: [id number] Date of Birth: 1955-07-02  Transition of Care [facility]) CM/SW Contact:  Delilah Shan, LCSWA Phone Number: 07/09/2022, 11:51 AM   Clinical Narrative:     CSW spoke with Earney Mallet at [facility] who confirmed patient good to dc over to facility today if medically ready.    Patient will DC to: [facility]   Anticipated DC date: 07/09/2022  Family notified: Elease Hashimoto   Transport by: Sharin Mons   ?  Per MD patient ready for DC to [facility] . RN, patient, patient's family, and facility notified of DC. Discharge Summary sent to facility. RN given number for report tele# [phone] RM#[id number] . DC packet on chart. Ambulance transport requested for patient.  CSW signing off.   Final next level of care: Skilled Nursing Facility Barriers to Discharge: No Barriers Identified   Patient Goals and CMS Choice CMS Medicare.gov Compare Post Acute Care list provided to:: Patient Represenative (must comment) (patients spouse) Choice offered to / list presented to : Spouse  Discharge Placement                Patient chooses bed at:  [facility]) Patient to be transferred to facility by: [facility] Name of family member notified: Elease Hashimoto Patient and family notified of of transfer: 07/09/22  Discharge Plan and Services Additional resources added to the After Visit Summary for   In-house Referral: Clinical Social Work Discharge Planning Services: CM Consult Post Acute Care Choice: Home Health                               Social Determinants of Health (SDOH) Interventions SDOH Screenings   Food Insecurity: No Food Insecurity (06/21/2022)  Housing: Low Risk  (06/21/2022)  Transportation Needs: No Transportation Needs (06/21/2022)  Utilities: Not At Risk (06/21/2022)  Tobacco Use: Medium Risk (06/20/2022)     Readmission Risk Interventions     No data to display

## 2022-07-09 NOTE — Progress Notes (Signed)
Mobility Specialist Progress Note:   07/09/22 0915  Mobility  Activity Ambulated with assistance in hallway  Level of Assistance Minimal assist, patient does 75% or more  Assistive Device Front wheel walker  Distance Ambulated (ft) 300 ft  Activity Response Tolerated well  Mobility Referral Yes  $Mobility charge 1 Mobility  Mobility Specialist Start Time (ACUTE ONLY) 0915  Mobility Specialist Stop Time (ACUTE ONLY) 0925  Mobility Specialist Time Calculation (min) (ACUTE ONLY) 10 min   Pt agreeable to mobility session. Required up to minA with turns and RW steering. Otherwise pt minG with ambulation. Left sitting in chair with all needs met.   Addison Lank Mobility Specialist Please contact via SecureChat or  Rehab office at [phone]

## 2022-07-21 LAB — MISC LABCORP TEST (SEND OUT)

## 2022-07-22 LAB — MISC LABCORP TEST (SEND OUT)

## 2022-07-23 ENCOUNTER — Ambulatory Visit
Payer: Medicare Other | Attending: Internal Medicine | Admitting: Pharmacist Clinician (PhC)/ Clinical Pharmacy Specialist

## 2022-07-23 DIAGNOSIS — E782 Mixed hyperlipidemia: Secondary | ICD-10-CM | POA: Diagnosis not present

## 2022-07-23 NOTE — Patient Instructions (Signed)
Your Results:             Your most recent labs Goal  Total Cholesterol 235 < 200  Triglycerides 454 < 150  HDL (happy/good cholesterol) 28 > 40  LDL (lousy/bad cholesterol unknown < 55   Medication changes:  We will start the process to get Leqvio covered by your insurance.  Once the prior authorization is complete, I will call/send a MyChart message to let you know.  You will get a call from [facility] on [facility] to make arrangements for your injections.   I will start the process to get Vascepa covered by your insurance.  You will take 2 capsules twice daily.  (You can store them in the refrigerator if they cause a fishy burp or after taste)  Lab orders:  We want to repeat labs about a month after the second dose.  We will send you a lab order to remind you once we get closer to that time.      Thank you for choosing [facility]

## 2022-07-23 NOTE — Progress Notes (Signed)
Office Visit    Patient Name: Kyle [name] Date of Encounter: 07/26/2022  Primary Care Provider:  No primary care provider on file. Primary Cardiologist:  Chilton Si, MD  Chief Complaint    Hyperlipidemia   Significant Past Medical History   SVT On amiodarone, metoprolol  HTN On hydralaizne 25 mg tid  DM2 A1c 11.6 on metformin, insulin  PAD Abnormal ABI's  Seizure disorder On topiramate, perampanel, lacosamide     Allergies  Allergen Reactions   Statins     Leg cramps   Gabapentin Palpitations    History of Present Illness    Federick Dace is a 67 y.o. male patient of Dr Duke Salvia, in the office today to discuss options for cholesterol management.   He had not been seen in cardiology since 2014, but last month was admitted to [facility] with seizure-like activity.  His history included SVT, hypertension (2/2 hyperaldosteronism), PAD and poorly controled DM.  Cardiology was consulted and on discharge he was on metoprolol and amiodarone, and was asked to follow with PharmD clinic for management of elevated lipids.   Insurance Carrier: [employer]  [employer]  LDL Cholesterol goal:  LDL < 55  Current Medications:   gemfibrozil 600 mg bid  Previously tried:  statins - myalgias  Family Hx:   both parents and 1 sister with hypertension, dad and sister also with DM  Social Hx: Tobacco: former smoker Alcohol:  no  Diet:  mostly home cooked meals     Accessory Clinical Findings   Lab Results  Component Value Date   CHOL 235 (H) 06/21/2022   HDL 28 (L) 06/21/2022   LDLCALC UNABLE TO CALCULATE IF TRIGLYCERIDE OVER 400 mg/dL 21/30/8657   LDLDIRECT 127 (H) 06/21/2022   TRIG 454 (H) 06/21/2022   CHOLHDL 8.4 06/21/2022    Lab Results  Component Value Date   ALT 36 07/07/2022   AST 26 07/07/2022   ALKPHOS 88 07/07/2022   BILITOT 0.6 07/07/2022   Lab Results  Component Value Date   CREATININE 1.10 07/07/2022   BUN 22  07/07/2022   NA 138 07/07/2022   K 3.2 (L) 07/07/2022   CL 110 07/07/2022   CO2 18 (L) 07/07/2022   Lab Results  Component Value Date   HGBA1C 11.6 (H) 06/21/2022    Home Medications    Current Outpatient Medications  Medication Sig Dispense Refill   amiodarone (PACERONE) 200 MG tablet Take 1 tablet (200 mg total) by mouth daily.     aspirin 81 MG tablet Take 81 mg by mouth daily.     b complex vitamins tablet Take 1 tablet by mouth daily.     clonazepam (KLONOPIN) 0.25 MG disintegrating tablet Take 1 tablet (0.25 mg total) by mouth at bedtime. 30 tablet 0   clonazePAM (KLONOPIN) 2 MG tablet Take 1 tablet (2 mg total) by mouth 3 (three) times daily as needed (as needed for sizure only). 30 tablet 0   gemfibrozil (LOPID) 600 MG tablet Take 1 tablet (600 mg total) by mouth 2 (two) times daily before a meal. 180 tablet 1   glucose blood test strip Use as instructed (Patient taking differently: 1 each by Other route See admin instructions. Use as instructed) 100 each 12   hydrALAZINE (APRESOLINE) 25 MG tablet Take 1 tablet (25 mg total) by mouth every 8 (eight) hours.     insulin aspart (NOVOLOG) 100 UNIT/ML injection Inject 4 Units into the skin 3 (three) times daily with  meals. 10 mL 11   insulin glargine-yfgn (SEMGLEE) 100 UNIT/ML injection Inject 0.12 mLs (12 Units total) into the skin daily. 10 mL 11   lacosamide (VIMPAT) 200 MG TABS tablet Take 1 tablet (200 mg total) by mouth 2 (two) times daily. 60 tablet 0   Lancets 30G MISC Use to check home glucose daily and as needed (Patient taking differently: 1 each by Other route See admin instructions. Use to check home glucose daily and as needed for blood sugar) 100 each prn   metFORMIN (GLUCOPHAGE) 500 MG tablet Take 2 tablets (1,000 mg total) by mouth 2 (two) times daily with a meal. 360 tablet 3   metoprolol succinate (TOPROL-XL) 100 MG 24 hr tablet Take 1 tablet (100 mg total) by mouth daily. Take with or immediately following a meal.  30 tablet 0   perampanel 4 MG TABS Take 1 tablet (4 mg total) by mouth at bedtime. 30 tablet 0   senna-docusate (SENOKOT-S) 8.6-50 MG tablet Take 2 tablets by mouth 2 (two) times daily.     topiramate (TOPAMAX) 50 MG tablet Take 3 tablets (150 mg total) by mouth 2 (two) times daily. 180 tablet 0   No current facility-administered medications for this visit.     Assessment & Plan    Hyperlipidemia Assessment: Patient with ASCVD not at LDL goal of < 55 Triglycerides elevated - in part 2/2 uncontrolled DM2 Most recent LDL at 127 (direct) on 06/21/22 Most recent TG at 454 on same day - on gemfibrozil 600 mg bid Not able to tolerate statins secondary to myalgias Reviewed options for lowering LDL cholesterol, including ezetimibe, PCSK-9 inhibitors, bempedoic acid and inclisiran.  Discussed mechanisms of action, dosing, side effects, potential decreases in LDL cholesterol and costs.  Also reviewed potential options for patient assistance. Reviewed options for lowering TG, including icosapent ethyl and fibrated.  Patient already on gemfibrozil and tolerating.   Plan: Patient agreeable to starting Vascepa and Leqvio Repeat labs after:  3 months Lipid Liver function Patient was given information on [employer] - will sign patient up when PA approved Marital status - married Income < $72,000 (single) or < $102,000 (married) - yes   Phillips Hay, PharmD CPP [facility] [address]  [place], [place] [address] [phone]  07/26/2022, 10:14 AM

## 2022-07-26 ENCOUNTER — Other Ambulatory Visit (HOSPITAL_COMMUNITY): Payer: Self-pay

## 2022-07-26 ENCOUNTER — Encounter: Payer: Self-pay | Admitting: Pharmacist Clinician (PhC)/ Clinical Pharmacy Specialist

## 2022-07-26 ENCOUNTER — Telehealth: Payer: Self-pay | Admitting: Pharmacist Clinician (PhC)/ Clinical Pharmacy Specialist

## 2022-07-26 NOTE — Telephone Encounter (Signed)
Per test claim no PA required for a brand name Vascepa, ran with DAW 1. Please make sure the DAW is on the script sent to filling pharmacy.    COST IS HIGH BECAUSE PT HAS A DEDUCTIBLE THAT STILL HASN'T BEEN MET.

## 2022-07-26 NOTE — Assessment & Plan Note (Signed)
Assessment: Patient with ASCVD not at LDL goal of < 55 Triglycerides elevated - in part 2/2 uncontrolled DM2 Most recent LDL at 127 (direct) on 06/21/22 Most recent TG at 454 on same day - on gemfibrozil 600 mg bid Not able to tolerate statins secondary to myalgias Reviewed options for lowering LDL cholesterol, including ezetimibe, PCSK-9 inhibitors, bempedoic acid and inclisiran.  Discussed mechanisms of action, dosing, side effects, potential decreases in LDL cholesterol and costs.  Also reviewed potential options for patient assistance. Reviewed options for lowering TG, including icosapent ethyl and fibrated.  Patient already on gemfibrozil and tolerating.   Plan: Patient agreeable to starting Vascepa and Leqvio Repeat labs after:  3 months Lipid Liver function Patient was given information on [employer] - will sign patient up when PA approved Marital status - married Income < $72,000 (single) or < $102,000 (married) - yes

## 2022-07-26 NOTE — Telephone Encounter (Signed)
Could you please do PA for Vascepa 2 gm bid

## 2022-07-29 ENCOUNTER — Encounter: Payer: Self-pay | Admitting: Physician Assistant

## 2022-07-29 NOTE — Telephone Encounter (Signed)
Signed patient up for [employer] grant  ID [id number] BIN  [id number] PCN   [id number] GRP   [id number]  Exp 06/28/23  Patient and spouse headed out, will return call tomorrow to determine pharmacy (they will check with mail order) and share copay card information.

## 2022-07-29 NOTE — Progress Notes (Signed)
Spoke with Al Decant, PA-C at [facility] regarding patient. He has an appt with Dr. Karel Jarvis on 6/18 and has recently been placed on Vimpat 200 mg bid after hospitalization for seizure. He had taken his last dose this morning and is out of medication till seeing Dr. Karel Jarvis. Unfortunately, there are some insurance coverage issues that hinder refill. He had also been placed on additional agent,perampanel 4 mg qhs although insurance issues also could be of concern. Patient to discuss plans during the visit.  Vimpat 100 mg sample #14 x 2 boxes will be given to patient.  Provider and patient were very appreciative.

## 2022-07-30 ENCOUNTER — Other Ambulatory Visit: Payer: Self-pay

## 2022-07-30 ENCOUNTER — Other Ambulatory Visit (HOSPITAL_COMMUNITY): Payer: Self-pay

## 2022-07-30 MED ORDER — VASCEPA 1 G PO CAPS
2.0000 g | ORAL_CAPSULE | Freq: Two times a day (BID) | ORAL | 3 refills | Status: DC
  Filled 2022-07-30: qty 360, 90d supply, fill #0

## 2022-07-30 NOTE — Addendum Note (Signed)
Addended by: Rosalee Kaufman on: 07/30/2022 10:38 AM   Modules accepted: Orders

## 2022-07-30 NOTE — Telephone Encounter (Signed)
Wife LMOM asking to send rx to [facility] order, using [employer].

## 2022-08-02 ENCOUNTER — Other Ambulatory Visit: Payer: Self-pay | Admitting: Pharmacist Clinician (PhC)/ Clinical Pharmacy Specialist

## 2022-08-03 ENCOUNTER — Encounter: Payer: Self-pay | Admitting: Neurology

## 2022-08-03 ENCOUNTER — Ambulatory Visit (INDEPENDENT_AMBULATORY_CARE_PROVIDER_SITE_OTHER): Payer: Medicare Other | Admitting: Neurology

## 2022-08-03 ENCOUNTER — Ambulatory Visit (HOSPITAL_BASED_OUTPATIENT_CLINIC_OR_DEPARTMENT_OTHER): Payer: Medicare Other | Admitting: Family

## 2022-08-03 VITALS — BP 143/62 | HR 67 | Ht 70.5 in | Wt 162.4 lb

## 2022-08-03 DIAGNOSIS — R9089 Other abnormal findings on diagnostic imaging of central nervous system: Secondary | ICD-10-CM | POA: Diagnosis not present

## 2022-08-03 DIAGNOSIS — G40201 Localization-related (focal) (partial) symptomatic epilepsy and epileptic syndromes with complex partial seizures, not intractable, with status epilepticus: Secondary | ICD-10-CM

## 2022-08-03 MED ORDER — LACOSAMIDE 100 MG PO TABS: ORAL_TABLET | ORAL | 5 refills | Status: DC

## 2022-08-03 MED ORDER — TOPIRAMATE 100 MG PO TABS: ORAL_TABLET | ORAL | 3 refills | Status: DC

## 2022-08-03 MED ORDER — PERAMPANEL 4 MG PO TABS: 4.0000 mg | ORAL_TABLET | Freq: Every day | ORAL | 5 refills | Status: DC

## 2022-08-03 NOTE — Progress Notes (Signed)
NEUROLOGY CONSULTATION NOTE  Kyle [name] MRN: [id number] DOB: 11-Sep-1955  Referring provider: Porfirio Oar, PA Primary care provider: Porfirio Oar, PA  Reason for consult:  seizures   Thank you for your kind referral of Kyle [name] for consultation of the above symptoms. Although his history is well known to you, please allow me to reiterate it for the purpose of our medical record. The patient was accompanied to the clinic by his wife who also provides collateral information. Records and images were personally reviewed where available.  HISTORY OF PRESENT ILLNESS: This is a pleasant 67 year old right-handed man with a history of hypertension, hyperlipidemia, DM, PAD, presenting for evaluation of new onset seizure. He was in the ER on 4/3 reporting 2 days of headache and right-sided vision changes. He describes intermittent flashing lights with 2 squares of primary colors on the right visual field lasting 30 seconds to 5 minutes. He would get confused, starting to talk then forget what he was trying to say. No focal numbness/tingling/weakness, jerking movements. He put his hand on the right inferior quadrant and would see a shadow even when the flashes stopped. His wife noticed he would look to the right side and rub his fingers. His glucose levels were in the 300s to 400s due to medication cost. He had a brain MRI without contrast with no acute changes reported. He was back in the ER on 06/21/22 due to an increase in vision changes. During evaluation, he would become very slow to respond but would still be able to interact somewhat. There was variable right gaze preference during these events. He reported flashing lights on the right side of his vision and had a left inferior quadrantanopia on exam even after episodes resolved. MRI brain from 4/3 was reviewed and it was noted that there was a small curvilinear focus of susceptibility artifact in the left occipital lobe, likely a small  focus of chronic hemosiderin staining that could serve as seizure focus. 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 He underwent video EEG monitoring from 5/6 to 5/15 with continued seizures occurring every hour, last seizure was 06/29/22. He required multiple seizure medications with increasing doses for seizure control, Levetiracetam, Vimpat, Topiramate, clonazepam, Perampanel. He became increasingly sedated and Levetiracetam was weaned off, clonazepam reduced, and Topamax stopped due to hyperammonemia. Overnight EEG on 5/20-21 showed diffuse slowing. Repeat brain MRI with and without contrast on 06/29/22 again showed subtle abnormality in the left occipital lobe with mildly heterogeneous diffusion and FLAIR, but also a larger area of asymmetrically decreased T2 white matter signal. He had a lumbar puncture with normal CSF, including paraneoplastic panel. Serum paraneoplastic panel also negative. CT chest/abdomen/pelvis showed a heterogeneously enhancing mass in the right kidney. Symptoms favoring non-ketotic hyperglycemic occipital seizures. It was recommended to repeat brain MRI and ambulatory EEG to help guide weaning of seizure medications. He was discharged home on 5/24 with note of poorly controlled DM, HbA1c was 11.6. Seizure medications on discharge were Topiramate 150mg  BID, Lacosamide 200mg  BID, Perampanel 4mg  at bedtime, and clonazepam 0.25mg  at bedtime with prn clonazepam for seizure rescue that he has not needed.   He initially had a headache but these have resolved. He has not had any visual changes since hospital discharge. He denies any prior history of seizures. He denies any headaches, dizziness. He has ran out of Fycompa for the past 4 days. He continues to fight with  insurance for his DM medications. He has not been sleeping well, new since the seizures. No daytime drowsiness. He feels memory is  good.   Epilepsy Risk Factors:  His mother had a few seizures later in life.He had a normal birth and early development.  There is no history of febrile convulsions, CNS infections such as meningitis/encephalitis, significant traumatic brain injury, neurosurgical procedures.  Prior ASMs: Levetiracetam, Vimpat   PAST MEDICAL HISTORY: Past Medical History:  Diagnosis Date   Cataracts, bilateral    Diabetes mellitus without complication (HCC)    DM (diabetes mellitus) (HCC)    History of basal cell carcinoma of skin    shoulder   HLD (hyperlipidemia)    Hypertension    Migraines    PAD (peripheral artery disease) (HCC)    Prediabetes     PAST SURGICAL HISTORY: Past Surgical History:  Procedure Laterality Date   SPINE SURGERY  1994   TONSILLECTOMY  1961    MEDICATIONS: Current Outpatient Medications on File Prior to Visit  Medication Sig Dispense Refill   amiodarone (PACERONE) 200 MG tablet Take 1 tablet (200 mg total) by mouth daily.     b complex vitamins tablet Take 1 tablet by mouth daily.     clonazepam (KLONOPIN) 0.25 MG disintegrating tablet Take 1 tablet (0.25 mg total) by mouth at bedtime. 30 tablet 0   clonazePAM (KLONOPIN) 2 MG tablet Take 1 tablet (2 mg total) by mouth 3 (three) times daily as needed (as needed for sizure only). 30 tablet 0   gemfibrozil (LOPID) 600 MG tablet Take 1 tablet (600 mg total) by mouth 2 (two) times daily before a meal. 180 tablet 1   glucose blood test strip Use as instructed (Patient taking differently: 1 each by Other route See admin instructions. Use as instructed) 100 each 12   hydrALAZINE (APRESOLINE) 100 MG tablet Take 100 mg by mouth 3 (three) times daily.     Insulin Glargine (BASAGLAR KWIKPEN [place]) Inject 14 Units into the skin once.     lacosamide (VIMPAT) 200 MG TABS tablet Take 1 tablet (200 mg total) by mouth 2 (two) times daily. 60 tablet 0   Lancets 30G MISC Use to check home glucose daily and as needed (Patient taking  differently: 1 each by Other route See admin instructions. Use to check home glucose daily and as needed for blood sugar) 100 each prn   metFORMIN (GLUCOPHAGE) 500 MG tablet Take 2 tablets (1,000 mg total) by mouth 2 (two) times daily with a meal. 360 tablet 3   metoprolol succinate (TOPROL-XL) 100 MG 24 hr tablet Take 1 tablet (100 mg total) by mouth daily. Take with or immediately following a meal. 30 tablet 0   potassium chloride SA (KLOR-CON M) 20 MEQ tablet Take 20 mEq by mouth daily.     topiramate (TOPAMAX) 50 MG tablet Take 3 tablets (150 mg total) by mouth 2 (two) times daily. 180 tablet 0   VASCEPA 1 g capsule Take 2 capsules (2 g total) by mouth 2 (two) times daily. 360 capsule 3   insulin aspart (NOVOLOG) 100 UNIT/ML injection Inject 4 Units into the skin 3 (three) times daily with meals. (Patient not taking: Reported on 08/03/2022) 10 mL 11   perampanel 4 MG TABS Take 1 tablet (4 mg total) by mouth at bedtime. (Patient not taking: Reported on 08/03/2022) 30 tablet 0   No current facility-administered medications on file prior to visit.    ALLERGIES: Allergies  Allergen Reactions  Statins     Leg cramps   Gabapentin Palpitations    FAMILY HISTORY: Family History  Problem Relation Age of Onset   Diabetes Father    Hypertension Father    Hypertension Mother    Hypertension Sister    Diabetes Sister        diet controlled    SOCIAL HISTORY: Social History   Socioeconomic History   Marital status: Married    Spouse name: Dennie Bible   Number of children: 2   Years of education: GED   Highest education level: Not on file  Occupational History   Occupation: retired 07/2015    Comment: former truck Hospital doctor, Museum/gallery exhibitions officer   Occupation: drives cars    Comment: [employer], Wednesdays   Occupation: Parts Delivery    Comment: [employer], 3 days/week  Tobacco Use   Smoking status: Former    Types: Cigarettes    Start date: 11/05/2007   Smokeless tobacco:  Never  Vaping Use   Vaping Use: Never used  Substance and Sexual Activity   Alcohol use: No    Alcohol/week: 0.0 standard drinks of alcohol   Drug use: No   Sexual activity: Yes    Partners: Female    Birth control/protection: None  Other Topics Concern   Not on file  Social History Narrative   Lives with his wife and their pets.   Daughters live in [place] and [place].   Son lives in [place].   Enjoying doing work around the house and yard.   Is thinking about part time work at some time.   Right handed   Social Determinants of Health   Financial Resource Strain: Not on file  Food Insecurity: No Food Insecurity (06/21/2022)   Hunger Vital Sign    Worried About Running Out of Food in the Last Year: Never true    Ran Out of Food in the Last Year: Never true  Transportation Needs: No Transportation Needs (06/21/2022)   PRAPARE - Administrator, Civil Service (Medical): No    Lack of Transportation (Non-Medical): No  Physical Activity: Not on file  Stress: Not on file  Social Connections: Not on file  Intimate Partner Violence: Not At Risk (06/21/2022)   Humiliation, Afraid, Rape, and Kick questionnaire    Fear of Current or Ex-Partner: No    Emotionally Abused: No    Physically Abused: No    Sexually Abused: No     PHYSICAL EXAM: Vitals:   08/03/22 1253  BP: (!) 143/62  Pulse: 67  SpO2: 98%   General: No acute distress Head:  Normocephalic/atraumatic Skin/Extremities: No rash, no edema Neurological Exam: Mental status: alert and oriented to person, place, and time, no dysarthria or aphasia, Fund of knowledge is appropriate.  Recent and remote memory are intact, 2/3 delayed recall.  Attention and concentration are normal, 5/5 WORLD backwards. Cranial nerves: CN I: not tested CN II: pupils equal, round, visual fields intact CN III, IV, VI:  full range of motion, no nystagmus, no ptosis CN V: facial sensation intact CN VII: upper and lower face symmetric CN  VIII: hearing intact to conversation Bulk & Tone: normal, no fasciculations. Motor: 5/5 throughout with no pronator drift. Sensation: intact to light touch, cold, pin, vibration sense.  No extinction to double simultaneous stimulation.  Romberg test negative Deep Tendon Reflexes: +1 throughout Cerebellar: no incoordination on finger to nose testing Gait: narrow-based and steady, able to tandem walk adequately. 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 He had presented with focal sensory seizures with visual features in the setting of non-ketotic hyperglycemia. MRI brain showed increased signal in the left occipital lobe. He was having very difficult to control seizures while admitted in May requiring multiple seizure medications. He denies any further seizures since 06/29/2022 on Topiramate 150mg  BID, clonazepam 0.25mg  at bedtime, Vimpat 200mg  BID and Fycompa 4mg  at bedtime. Recommendation was for ambulatory EEG as medications are adjusted as outpatient due to the high burden of seizures he had in the hospital. He ran out of Fycompa due to insurance issues, samples provided today as we work on prior authorization for patient. Interval brain MRI with and without contrast will be ordered. We will start with a routine EEG for now. Follow-up with Urology as scheduled. [place] driving laws were discussed with the patient, and he knows to stop driving after a seizure, until 6 months seizure-free. Follow-up in 1 month, call for any changes.     Thank you for allowing me to participate in the care of this patient. Please do not hesitate to call for any questions or concerns.   Patrcia Dolly, M.D.  CC: Porfirio Oar, [place]

## 2022-08-03 NOTE — Patient Instructions (Signed)
Good to meet you. Glad to see you doing better.   Schedule MRI brain with and without contrast  2. Schedule EEG  3. Continue Lacosamide 100mg  twice a day,                     Fycompa 4mg  every night,                     Topiramate 100mg : Take 1 and 1/2 tablets twice a day,                     clonazepam 0.25mg  every night  4. Follow-up with Urology as scheduled  5. Follow-up in 1 month, call for any changes

## 2022-08-04 ENCOUNTER — Telehealth: Payer: Self-pay | Admitting: Neurology

## 2022-08-04 ENCOUNTER — Telehealth: Payer: Medicare Other

## 2022-08-04 ENCOUNTER — Telehealth: Payer: Self-pay

## 2022-08-04 NOTE — Telephone Encounter (Signed)
Duplicate

## 2022-08-04 NOTE — Telephone Encounter (Signed)
Do we have the letter from [employer]? I can write a letter. Let them know that he was in status epilepticus with difficult to control seizures, having seizures for 7 days until seizures were finally controlled on his current regimen. Risk of seizure is very high if he is not taking these medications. Thanks

## 2022-08-04 NOTE — Telephone Encounter (Signed)
Auth Submission: NO AUTH NEEDED Site of care: Site of care: [facility] Payer: Medicare & [employer] Supplement Medication & CPT/J Code(s) submitted: Leqvio (Inclisiran) [id number]  Auth type: Buy/Bill Units/visits requested: 284mg  q 3 months for 2 doses  Approval from: 08/04/2022 to 02/15/2023

## 2022-08-04 NOTE — Telephone Encounter (Signed)
Pt was given RX for a medication that is way to expensive and he wants to know if he can be switched to  Lamotragine 100 mg ( that is how she spelled it ) also the Fycompa 4 mg needs to be prior auth  per [employer]

## 2022-08-05 ENCOUNTER — Other Ambulatory Visit: Payer: Medicare Other

## 2022-08-09 ENCOUNTER — Ambulatory Visit (HOSPITAL_BASED_OUTPATIENT_CLINIC_OR_DEPARTMENT_OTHER): Payer: Medicare Other | Admitting: Family

## 2022-08-10 ENCOUNTER — Ambulatory Visit (INDEPENDENT_AMBULATORY_CARE_PROVIDER_SITE_OTHER): Payer: Medicare Other | Admitting: Neurology

## 2022-08-10 DIAGNOSIS — R9089 Other abnormal findings on diagnostic imaging of central nervous system: Secondary | ICD-10-CM | POA: Diagnosis not present

## 2022-08-10 DIAGNOSIS — G40201 Localization-related (focal) (partial) symptomatic epilepsy and epileptic syndromes with complex partial seizures, not intractable, with status epilepticus: Secondary | ICD-10-CM

## 2022-08-12 ENCOUNTER — Ambulatory Visit (INDEPENDENT_AMBULATORY_CARE_PROVIDER_SITE_OTHER): Payer: Medicare Other

## 2022-08-12 VITALS — BP 179/76 | HR 59 | Temp 97.7°F | Resp 19 | Ht 70.5 in | Wt 161.0 lb

## 2022-08-12 DIAGNOSIS — E782 Mixed hyperlipidemia: Secondary | ICD-10-CM | POA: Diagnosis not present

## 2022-08-12 MED ORDER — INCLISIRAN SODIUM 284 MG/1.5ML ~~LOC~~ SOSY
284.0000 mg | PREFILLED_SYRINGE | Freq: Once | SUBCUTANEOUS | Status: AC
  Administered 2022-08-12: 284 mg via SUBCUTANEOUS
  Filled 2022-08-12: qty 1.5

## 2022-08-12 NOTE — Progress Notes (Signed)
Diagnosis: Hyperlipidemia  Provider:  Mannam, Praveen MD  Procedure: Injection  Leqvio (inclisiran), Dose: 284 mg, Site: subcutaneous, Number of injections: 1  Post Care: Observation period completed  Discharge: Condition: Good, Destination: Home . AVS Provided  Performed by:  Gwenneth Whiteman, RN       

## 2022-08-23 NOTE — Telephone Encounter (Signed)
Hi Kyle [name], thanks for your help with all our patients. Any update on his medications, he is supposed to be on Lacosamide 200mg  twice a day and Fycompa 4mg  every night. Have you heard if they were approved or denied? Thanks

## 2022-08-23 NOTE — Procedures (Signed)
ELECTROENCEPHALOGRAM REPORT  Date of Study: 08/10/2022  Patient's Name: Kyle [name] MRN: [id number] Date of Birth: [birth year]  Referring Provider: Dr. Patrcia Dolly  Clinical History: This is a 67 year old man with new onset seizures presenting in focal status epilepticus in May. EEG to guide medication management.  Medications: Topiramate, clonazepam, Vimpat, Fycompa  Technical Summary: A multichannel digital EEG recording measured by the international 10-20 system with electrodes applied with paste and impedances below 5000 ohms performed in our laboratory with EKG monitoring in an awake and asleep patient.  Hyperventilation was not performed. Photic stimulation was performed.  The digital EEG was referentially recorded, reformatted, and digitally filtered in a variety of bipolar and referential montages for optimal display.    Description: The patient is awake and asleep during the recording.  During maximal wakefulness, there is a symmetric, medium voltage 9 Hz posterior dominant rhythm that attenuates with eye opening.  The record is symmetric.  During drowsiness and sleep, there is an increase in theta slowing of the background.  Vertex waves and symmetric sleep spindles were seen. Photic stimulation did not elicit any abnormalities.  There were no epileptiform discharges or electrographic seizures seen.    EKG lead was unremarkable.  Impression: This awake and asleep EEG is normal.    Clinical Correlation: A normal EEG does not exclude a clinical diagnosis of epilepsy.  If further clinical questions remain, prolonged EEG may be helpful.  Clinical correlation is advised.   Patrcia Dolly, M.D.

## 2022-08-24 ENCOUNTER — Encounter: Payer: Self-pay | Admitting: Cardiovascular Disease

## 2022-08-24 ENCOUNTER — Other Ambulatory Visit (HOSPITAL_COMMUNITY): Payer: Self-pay

## 2022-08-25 ENCOUNTER — Encounter: Payer: Self-pay | Admitting: Neurology

## 2022-08-25 ENCOUNTER — Other Ambulatory Visit (HOSPITAL_COMMUNITY): Payer: Self-pay

## 2022-08-25 ENCOUNTER — Telehealth: Payer: Self-pay | Admitting: Pharmacy Technician

## 2022-08-25 NOTE — Telephone Encounter (Signed)
Alternative provided to patient by insurance was Lacosamide (Vimpat) This is available without a PA. [employer] test billing results return a copay of $15.29 for 30 days.

## 2022-08-25 NOTE — Telephone Encounter (Signed)
Appeal letter for Fycompa done

## 2022-08-25 NOTE — Telephone Encounter (Signed)
Pharmacy Patient Advocate Encounter  Received notification from [facility] that the request for prior authorization for [facility] 4MG  has been denied due to .    Please be advised we currently do not have a Pharmacist to review denials, therefore you will need to process appeals accordingly as needed. Thanks for your support at this time.   You may call [phone] or fax [phone], to appeal.

## 2022-08-25 NOTE — Telephone Encounter (Signed)
Appeal letter faxed.

## 2022-08-27 ENCOUNTER — Telehealth: Payer: Self-pay

## 2022-08-27 NOTE — Telephone Encounter (Signed)
-----   Message from Van Clines sent at 08/27/2022 12:30 PM EDT ----- Pls let patient/wife know the EEG is normal, no seizure activity seen. I would like to continue on same medications we discussed, pls let them know the Lacosamide and Fycompa were approved, has he had any issues getting them now? Thanks

## 2022-08-27 NOTE — Telephone Encounter (Signed)
patient/wife both informed the EEG is normal, no seizure activity seen.Dr Karel Jarvis would like to continue on same medications we discussed, both informed the Lacosamide and Fycompa were approved they stated that medication was to much for them I told them we did the authorization for insurance to cover it they didn't want to hear me they talked over me said they would call the insurance company and talk to dr Karel Jarvis at his appointment

## 2022-09-01 ENCOUNTER — Ambulatory Visit (INDEPENDENT_AMBULATORY_CARE_PROVIDER_SITE_OTHER): Payer: Medicare Other | Admitting: Neurology

## 2022-09-01 ENCOUNTER — Other Ambulatory Visit (HOSPITAL_COMMUNITY): Payer: Self-pay

## 2022-09-01 ENCOUNTER — Encounter: Payer: Self-pay | Admitting: Neurology

## 2022-09-01 VITALS — BP 143/68 | HR 68 | Ht 70.5 in | Wt 158.4 lb

## 2022-09-01 DIAGNOSIS — G40201 Localization-related (focal) (partial) symptomatic epilepsy and epileptic syndromes with complex partial seizures, not intractable, with status epilepticus: Secondary | ICD-10-CM | POA: Diagnosis not present

## 2022-09-01 NOTE — Patient Instructions (Signed)
Good to see you doing well.  Proceed with brain MRI as scheduled  2. Start weaning off Fycompa to 2mg : take 1 tablet every night for 1 week, then stop  3. Continue all your other medications  4. Follow-up in 1 month, call for any changes   Seizure Precautions: 1. If medication has been prescribed for you to prevent seizures, take it exactly as directed.  Do not stop taking the medicine without talking to your doctor first, even if you have not had a seizure in a long time.   2. Avoid activities in which a seizure would cause danger to yourself or to others.  Don't operate dangerous machinery, swim alone, or climb in high or dangerous places, such as on ladders, roofs, or girders.  Do not drive unless your doctor says you may.  3. If you have any warning that you may have a seizure, lay down in a safe place where you can't hurt yourself.    4.  No driving for 6 months from last seizure, as per [facility].   Please refer to the following link on the [employer]ile[employer] of America's website for more information: http://www.epilepsyfoundation.org/answerplace/Social/driving/drivingu.cfm   5.  Maintain good sleep hygiene.  6.  Contact your doctor if you have any problems that may be related to the medicine you are taking.  7.  Call 911 and bring the patient back to the ED if:        A.  The seizure lasts longer than 5 minutes.       B.  The patient doesn't awaken shortly after the seizure  C.  The patient has new problems such as difficulty seeing, speaking or moving  D.  The patient was injured during the seizure  E.  The patient has a temperature over 102 F (39C)  F.  The patient vomited and now is having trouble breathing

## 2022-09-01 NOTE — Telephone Encounter (Signed)
Appeal has been approved and [employer] test billing results return a copay of $271.29 per 30 days.

## 2022-09-01 NOTE — Progress Notes (Signed)
NEUROLOGY FOLLOW UP OFFICE NOTE  Kyle [name] [id number] 1956-02-12  HISTORY OF PRESENT ILLNESS: I had the pleasure of seeing Kyle [name] in follow-up in the neurology clinic on 09/01/2022.  The patient was last seen a month ago for new onset seizures when he presented in focal status epilepticus in 06/2022 in the setting of non-ketotic hyperglycemia. He was discharged home on Topiramate 150mg  BID, clonazepam 0.25mg  at bedtime, Vimpat 200mg  BID and Fycompa 4mg  at bedtime. Records and images were personally reviewed where available.  Routine EEG 08/10/22 normal. He is scheduled for repeat brain MRI at the end of the month.  Since his last visit, he reports doing well with no seizures. No visual hallucinations, staring/unresponsive episodes, focal numbness/tingling/weakness. No headaches, dizziness, vision changes, no falls. He is sleeping well, however his wife notes he sleeps a lot. He has not been back to work. Sugar levels are still elevated, he started his new insulin 2 days ago.    History On Initial Assessment 08/03/2022: This is a pleasant 67 year old right-handed man with a history of hypertension, hyperlipidemia, DM, PAD, presenting for evaluation of new onset seizure. He was in the ER on 4/3 reporting 2 days of headache and right-sided vision changes. He describes intermittent flashing lights with 2 squares of primary colors on the right visual field lasting 30 seconds to 5 minutes. He would get confused, starting to talk then forget what he was trying to say. No focal numbness/tingling/weakness, jerking movements. He put his hand on the right inferior quadrant and would see a shadow even when the flashes stopped. His wife noticed he would look to the right side and rub his fingers. His glucose levels were in the 300s to 400s due to medication cost. He had a brain MRI without contrast with no acute changes reported. He was back in the ER on 06/21/22 due to an increase in vision changes.  During evaluation, he would become very slow to respond but would still be able to interact somewhat. There was variable right gaze preference during these events. He reported flashing lights on the right side of his vision and had a left inferior quadrantanopia on exam even after episodes resolved. MRI brain from 4/3 was reviewed and it was noted that there was a small curvilinear focus of susceptibility artifact in the left occipital lobe, likely a small focus of chronic hemosiderin staining that could serve as seizure focus. 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 He underwent video EEG monitoring from 5/6 to 5/15 with continued seizures occurring every hour, last seizure was 06/29/22. He required multiple seizure medications with increasing doses for seizure control, Levetiracetam, Vimpat, Topiramate, clonazepam, Perampanel. He became increasingly sedated and Levetiracetam was weaned off, clonazepam reduced, and Topamax stopped due to hyperammonemia. Overnight EEG on 5/20-21 showed diffuse slowing. Repeat brain MRI with and without contrast on 06/29/22 again showed subtle abnormality in the left occipital lobe with mildly heterogeneous diffusion and FLAIR, but also a larger area of asymmetrically decreased T2 white matter signal. He had a lumbar puncture with normal CSF, including paraneoplastic panel. Serum paraneoplastic panel also negative. CT chest/abdomen/pelvis showed a heterogeneously enhancing mass in the right kidney. Symptoms favoring non-ketotic hyperglycemic occipital seizures. It was recommended to repeat brain MRI and ambulatory EEG to help guide weaning of seizure medications. He was discharged home on 5/24 with note of poorly controlled DM, HbA1c was 11.6. Seizure medications on  discharge were Topiramate 150mg  BID, Lacosamide 200mg  BID, Perampanel 4mg  at bedtime, and clonazepam 0.25mg  at bedtime  with prn clonazepam for seizure rescue that he has not needed.   He initially had a headache but these have resolved. He has not had any visual changes since hospital discharge. He denies any prior history of seizures. He denies any headaches, dizziness. He has ran out of Fycompa for the past 4 days. He continues to fight with insurance for his DM medications. He has not been sleeping well, new since the seizures. No daytime drowsiness. He feels memory is good.   Epilepsy Risk Factors:  His mother had a few seizures later in life.He had a normal birth and early development.  There is no history of febrile convulsions, CNS infections such as meningitis/encephalitis, significant traumatic brain injury, neurosurgical procedures.  Prior ASMs: Levetiracetam, Vimpat  PAST MEDICAL HISTORY: Past Medical History:  Diagnosis Date   Cataracts, bilateral    Diabetes mellitus without complication (HCC)    DM (diabetes mellitus) (HCC)    History of basal cell carcinoma of skin    shoulder   HLD (hyperlipidemia)    Hypertension    Migraines    PAD (peripheral artery disease) (HCC)    Prediabetes     MEDICATIONS: Current Outpatient Medications on File Prior to Visit  Medication Sig Dispense Refill   amiodarone (PACERONE) 200 MG tablet Take 1 tablet (200 mg total) by mouth daily.     b complex vitamins tablet Take 1 tablet by mouth daily.     clonazepam (KLONOPIN) 0.25 MG disintegrating tablet Take 1 tablet (0.25 mg total) by mouth at bedtime. 30 tablet 0   clonazePAM (KLONOPIN) 2 MG tablet Take 1 tablet (2 mg total) by mouth 3 (three) times daily as needed (as needed for sizure only). 30 tablet 0   gemfibrozil (LOPID) 600 MG tablet Take 1 tablet (600 mg total) by mouth 2 (two) times daily before a meal. 180 tablet 1   glucose blood test strip Use as instructed (Patient taking differently: 1 each by Other route See admin instructions. Use as instructed) 100 each 12   hydrALAZINE (APRESOLINE) 100 MG  tablet Take 100 mg by mouth 3 (three) times daily.     insulin aspart (NOVOLOG) 100 UNIT/ML injection Inject 4 Units into the skin 3 (three) times daily with meals. 10 mL 11   Insulin Glargine (BASAGLAR KWIKPEN [place]) Inject 14 Units into the skin once.     Lacosamide 100 MG TABS Take 1 tablet twice a day 60 tablet 5   Lancets 30G MISC Use to check home glucose daily and as needed (Patient taking differently: 1 each by Other route See admin instructions. Use to check home glucose daily and as needed for blood sugar) 100 each prn   metFORMIN (GLUCOPHAGE) 500 MG tablet Take 2 tablets (1,000 mg total) by mouth 2 (two) times daily with a meal. 360 tablet 3   metoprolol succinate (TOPROL-XL) 100 MG 24 hr tablet Take 1 tablet (100 mg total) by mouth daily. Take with or immediately following a meal. 30 tablet 0   Perampanel 4 MG TABS Take 1 tablet (4 mg total) by mouth at bedtime. 30 tablet 5   potassium chloride SA (KLOR-CON M) 20 MEQ tablet Take 20 mEq by mouth daily.     topiramate (TOPAMAX) 100 MG tablet Take 1 and 1/2 tablets twice a day 270 tablet 3   VASCEPA 1 g capsule Take 2 capsules (2 g total) by  mouth 2 (two) times daily. 360 capsule 3   No current facility-administered medications on file prior to visit.    ALLERGIES: Allergies  Allergen Reactions   Statins     Leg cramps   Gabapentin Palpitations    FAMILY HISTORY: Family History  Problem Relation Age of Onset   Diabetes Father    Hypertension Father    Hypertension Mother    Hypertension Sister    Diabetes Sister        diet controlled    SOCIAL HISTORY: Social History   Socioeconomic History   Marital status: Married    Spouse name: Dennie Bible   Number of children: 2   Years of education: GED   Highest education level: Not on file  Occupational History   Occupation: retired 07/2015    Comment: former truck Hospital doctor, Museum/gallery exhibitions officer   Occupation: drives cars    Comment: [employer], Wednesdays   Occupation:  Parts Delivery    Comment: [employer], 3 days/week  Tobacco Use   Smoking status: Former    Types: Cigarettes    Start date: 11/05/2007   Smokeless tobacco: Never  Vaping Use   Vaping status: Never Used  Substance and Sexual Activity   Alcohol use: No    Alcohol/week: 0.0 standard drinks of alcohol   Drug use: No   Sexual activity: Yes    Partners: Female    Birth control/protection: None  Other Topics Concern   Not on file  Social History Narrative   Lives with his wife and their pets.   Daughters live in [place] and [place].   Son lives in [place].   Enjoying doing work around the house and yard.   Is thinking about part time work at some time.   Right handed   Social Determinants of Health   Financial Resource Strain: Low Risk  (05/25/2022)   Received from [facility], [facility]   Overall Financial Resource Strain (CARDIA)    Difficulty of Paying Living Expenses: Not very hard  Food Insecurity: No Food Insecurity (06/21/2022)   Hunger Vital Sign    Worried About Running Out of Food in the Last Year: Never true    Ran Out of Food in the Last Year: Never true  Transportation Needs: No Transportation Needs (06/21/2022)   PRAPARE - Administrator, Civil Service (Medical): No    Lack of Transportation (Non-Medical): No  Physical Activity: Insufficiently Active (05/25/2022)   Received from [facility], [facility]   Exercise Vital Sign    Days of Exercise per Week: 4 days    Minutes of Exercise per Session: 20 min  Stress: No Stress Concern Present (05/25/2022)   Received from [facility], [facility] of Occupational Health - Occupational Stress Questionnaire    Feeling of Stress : Not at all  Social Connections: Socially Integrated (05/25/2022)   Received from [facility], [facility]   Social Network    How would you rate your social network (family, work, friends)?: Good participation with social networks  Intimate  Partner Violence: Not At Risk (06/21/2022)   Humiliation, Afraid, Rape, and Kick questionnaire    Fear of Current or Ex-Partner: No    Emotionally Abused: No    Physically Abused: No    Sexually Abused: No     PHYSICAL EXAM: Vitals:   09/01/22 1420  BP: (!) 143/68  Pulse: 68  SpO2: 97%   General: No acute distress  Head:  Normocephalic/atraumatic Skin/Extremities: No rash, no edema Neurological Exam: alert and awake. No aphasia or dysarthria. Fund of knowledge is appropriate.  Attention and concentration are normal.   Cranial nerves: Pupils equal, round. Extraocular movements intact with no nystagmus. Visual fields full.  No facial asymmetry.  Motor: Bulk and tone normal, muscle strength 5/5 throughout with no pronator drift.   Finger to nose testing intact.  Gait narrow-based and steady, no ataxia, no tremors.   IMPRESSION: This is a pleasant 67 yo RH man with a history of hypertension, hyperlipidemia, DM, PAD, with new onset seizures in 06/2022 when he presented with focal status epilepticus from the left posterior quadrant. He had focal sensory seizures with visual features in the setting of non-ketotic hyperglycemia. MRI brain showed increased signal in the left occipital lobe. He was having very difficult to control seizures while admitted in May requiring multiple seizure medications. No further seizures since 06/29/2022. Repeat EEG normal. He is scheduled for repeat brain MRI at the end of the month. We discussed streamlining medications. Fycompa was initially denied by insurance, we were able to appeal and it was approved however his co-pay would still be $271.29. This is quite cost-prohibitive for him, we discussed weaning it off, reduce to 2mg  at bedtime for 1 week, then stop. Lacosamide 100mg  BID has been quoted to be $15.29 for 30 days, continue medication, he will call [facility] to deliver. Continue Topiramate 150mg  BID and clonazepam 0.25mg  at bedtime.Continue working on glucose  control. He is aware of [place] driving laws to stop driving after a seizure until 6 months seizure-free. Follow-up in 1 month, call for any changes.    Thank you for allowing me to participate in his care.  Please do not hesitate to call for any questions or concerns.    Patrcia Dolly, M.D.   CC: Porfirio Oar, [place]

## 2022-09-01 NOTE — Progress Notes (Signed)
Medication Samples have been provided to the patient.  Drug name: vimpat       Strength: 100        Qty: 1  LOT: [id number]  Exp.Date: 2/26  Dosing instructions: take as directed   The patient has been instructed regarding the correct time, dose, and frequency of taking this medication, including desired effects and most common side effects.    Medication Samples have been provided to the patient.  Drug name: fycompa       Strength: 2mg         Qty: 1  LOT: [id number]  Exp.Date: 1/26  Dosing instructions: take a directed   The patient has been instructed regarding the correct time, dose, and frequency of taking this medication, including desired effects and most common side effects.

## 2022-09-04 ENCOUNTER — Other Ambulatory Visit (HOSPITAL_COMMUNITY): Payer: Self-pay

## 2022-09-06 ENCOUNTER — Telehealth: Payer: Self-pay | Admitting: Neurology

## 2022-09-06 ENCOUNTER — Other Ambulatory Visit (HOSPITAL_COMMUNITY): Payer: Self-pay

## 2022-09-06 ENCOUNTER — Other Ambulatory Visit: Payer: Self-pay

## 2022-09-06 MED ORDER — LACOSAMIDE 100 MG PO TABS
ORAL_TABLET | ORAL | 0 refills | Status: DC
  Filled 2022-09-06: qty 60, 30d supply, fill #0

## 2022-09-06 NOTE — Addendum Note (Signed)
Addended by: Glendale Chard on: 09/06/2022 04:19 PM   Modules accepted: Orders

## 2022-09-06 NOTE — Telephone Encounter (Signed)
Pt is calling in needing a  refill on Rx Lacosamide 100 MG  to be sent to Pharm:  [facility].  Spouse would like to have a call once it has been sent in to the pharmacy.

## 2022-09-06 NOTE — Progress Notes (Unsigned)
Changed Lacosamide from [facility] script to [facility] due to high cost at [facility].

## 2022-09-06 NOTE — Telephone Encounter (Signed)
Rx sent to [facility]. 

## 2022-09-07 ENCOUNTER — Other Ambulatory Visit (HOSPITAL_COMMUNITY): Payer: Self-pay

## 2022-09-10 ENCOUNTER — Other Ambulatory Visit (HOSPITAL_COMMUNITY): Payer: Self-pay

## 2022-09-13 ENCOUNTER — Ambulatory Visit (INDEPENDENT_AMBULATORY_CARE_PROVIDER_SITE_OTHER): Payer: Medicare Other | Admitting: Family

## 2022-09-13 ENCOUNTER — Encounter (HOSPITAL_BASED_OUTPATIENT_CLINIC_OR_DEPARTMENT_OTHER): Payer: Self-pay | Admitting: Family

## 2022-09-13 VITALS — BP 142/90 | HR 60 | Ht 70.5 in | Wt 155.0 lb

## 2022-09-13 DIAGNOSIS — R002 Palpitations: Secondary | ICD-10-CM | POA: Diagnosis not present

## 2022-09-13 DIAGNOSIS — I10 Essential (primary) hypertension: Secondary | ICD-10-CM | POA: Diagnosis not present

## 2022-09-13 DIAGNOSIS — I471 Supraventricular tachycardia, unspecified: Secondary | ICD-10-CM

## 2022-09-13 DIAGNOSIS — E785 Hyperlipidemia, unspecified: Secondary | ICD-10-CM

## 2022-09-13 DIAGNOSIS — E269 Hyperaldosteronism, unspecified: Secondary | ICD-10-CM

## 2022-09-13 DIAGNOSIS — Z79899 Other long term (current) drug therapy: Secondary | ICD-10-CM

## 2022-09-13 DIAGNOSIS — E1165 Type 2 diabetes mellitus with hyperglycemia: Secondary | ICD-10-CM

## 2022-09-13 DIAGNOSIS — I700 Atherosclerosis of aorta: Secondary | ICD-10-CM

## 2022-09-13 DIAGNOSIS — Z794 Long term (current) use of insulin: Secondary | ICD-10-CM

## 2022-09-13 MED ORDER — METOPROLOL SUCCINATE ER 100 MG PO TB24
100.0000 mg | ORAL_TABLET | Freq: Every day | ORAL | 1 refills | Status: DC
Start: 2022-09-13 — End: 2023-12-22

## 2022-09-13 MED ORDER — AMIODARONE HCL 200 MG PO TABS
200.0000 mg | ORAL_TABLET | Freq: Every day | ORAL | 1 refills | Status: DC
Start: 2022-09-13 — End: 2023-12-22

## 2022-09-13 NOTE — Progress Notes (Unsigned)
Cardiology Office Note:  .   Date:  09/14/2022  ID:  Kyle [name], DOB Apr 07, 1955, MRN [id number] PCP: Porfirio Oar, PA   HeartCare Providers Cardiologist:  Chilton Si, MD    History of Present Illness: .   Kyle [name] is a 67 y.o. male with history of DM 2, HTN, HLD, PAD, SVT, seizure, hyperaldosteronism.  ED visit 05/18/2022 with visual disturbance x 2 days with CT head, MRI brain, neurological test unremarkable.  Presented 06/20/2022 with palpitations, lightheadedness, dizziness, presyncopal episodes.  Found to be in SVT.  He also had focal seizures.  MRI brain 5/14 septal abnormality left occipital lobe.  Continuous EEG demonstrated seizures.  He was started on Toprol, amiodarone for SVT.  Labetalol was discontinued.  Lisinopril, spironolactone held due to AKI which normalized prior to discharge.  Echocardiogram during admission normal LVEF, no significant valvular maladies.  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  His A1c was 11.6 and was discharged on insulin.  He saw pharmacy team 07/23/2022 for lipid management and was started on Vascepa and Leqvio.  Presents today for follow-up with his wife.  Has been following close with neurology with adjustment of antiseizure medications.  Spironolactone and lisinopril discontinued during hospitalization.  He and his wife are under the impression it was related to spironolactone interaction with antiseizure medications.  I anticipate it was related to AKI during admission which has since resolved. BP in the morning 140-160/82 by home cuff.   ROS: Please see the history of present illness.    All other systems reviewed and are negative.   Studies Reviewed: Marland Kitchen   EKG Interpretation Date/Time:  Monday September 13 2022 15:05:32 EDT Ventricular Rate:  60 PR Interval:  204 QRS Duration:  96 QT Interval:  442 QTC Calculation: 442 R Axis:   76  Text Interpretation: Normal sinus  rhythm Normal ECG Confirmed by Gillian Shields ([id number]) on 09/13/2022 3:25:34 PM    Cardiac Studies & Procedures       ECHOCARDIOGRAM  ECHOCARDIOGRAM COMPLETE 06/22/2022  Narrative ECHOCARDIOGRAM REPORT    Patient Name:   Kyle [name] Date of Exam: 06/22/2022 Medical Rec #:  [id number]         Height:       70.0 in Accession #:    [id number]        Weight:       174.8 lb Date of Birth:  February 24, 1955         BSA:          1.971 m Patient Age:    66 years          BP:           115/75 mmHg Patient Gender: M                 HR:           75 bpm. Exam Location:  Inpatient  Procedure: 2D Echo, Cardiac Doppler and Color Doppler  Indications:    I47.1 SVT  History:        Patient has prior history of Echocardiogram examinations. Risk Factors:Hypertension, Diabetes and Dyslipidemia.  Sonographer:    Mike Gip Referring Phys: [phone] ANAND D HONGALGI  IMPRESSIONS   1. Left ventricular ejection fraction, by estimation, is 55 to 60%. Left ventricular ejection fraction by 2D MOD biplane is 56.7 %. The left ventricle has normal function. The left ventricle has no regional wall motion abnormalities. Left ventricular diastolic parameters are  consistent with Grade I diastolic dysfunction (impaired relaxation). 2. Right ventricular systolic function is normal. The right ventricular size is normal. There is normal pulmonary artery systolic pressure. The estimated right ventricular systolic pressure is 19.0 mmHg. 3. The mitral valve is grossly normal. Trivial mitral valve regurgitation. No evidence of mitral stenosis. 4. The aortic valve is tricuspid. Aortic valve regurgitation is not visualized. No aortic stenosis is present. 5. The inferior vena cava is normal in size with greater than 50% respiratory variability, suggesting right atrial pressure of 3 mmHg.  FINDINGS Left Ventricle: Left ventricular ejection fraction, by estimation, is 55 to 60%. Left ventricular ejection fraction by 2D MOD  biplane is 56.7 %. The left ventricle has normal function. The left ventricle has no regional wall motion abnormalities. The left ventricular internal cavity size was normal in size. There is no left ventricular hypertrophy. Left ventricular diastolic parameters are consistent with Grade I diastolic dysfunction (impaired relaxation).  Right Ventricle: The right ventricular size is normal. No increase in right ventricular wall thickness. Right ventricular systolic function is normal. There is normal pulmonary artery systolic pressure. The tricuspid regurgitant velocity is 2.00 m/s, and with an assumed right atrial pressure of 3 mmHg, the estimated right ventricular systolic pressure is 19.0 mmHg.  Left Atrium: Left atrial size was normal in size.  Right Atrium: Right atrial size was normal in size.  Pericardium: There is no evidence of pericardial effusion.  Mitral Valve: The mitral valve is grossly normal. Trivial mitral valve regurgitation. No evidence of mitral valve stenosis.  Tricuspid Valve: The tricuspid valve is grossly normal. Tricuspid valve regurgitation is trivial. No evidence of tricuspid stenosis.  Aortic Valve: The aortic valve is tricuspid. Aortic valve regurgitation is not visualized. No aortic stenosis is present.  Pulmonic Valve: The pulmonic valve was grossly normal. Pulmonic valve regurgitation is not visualized. No evidence of pulmonic stenosis.  Aorta: The aortic root is normal in size and structure.  Venous: The inferior vena cava is normal in size with greater than 50% respiratory variability, suggesting right atrial pressure of 3 mmHg.  IAS/Shunts: The atrial septum is grossly normal.   LEFT VENTRICLE PLAX 2D                        Biplane EF (MOD) LVIDd:         4.30 cm         LV Biplane EF:   Left LVIDs:         3.10 cm                          ventricular LV PW:         0.90 cm                          ejection LV IVS:        0.90 cm                           fraction by LVOT diam:     2.00 cm                          2D MOD LV SV:         46  biplane is LV SV Index:   23                               56.7 %. LVOT Area:     3.14 cm Diastology LV e' medial:    7.07 cm/s LV Volumes (MOD)               LV E/e' medial:  4.8 LV vol d, MOD    104.0 ml      LV e' lateral:   9.46 cm/s A2C:                           LV E/e' lateral: 3.6 LV vol d, MOD    122.0 ml A4C: LV vol s, MOD    45.1 ml A2C: LV vol s, MOD    52.3 ml A4C: LV SV MOD A2C:   58.9 ml LV SV MOD A4C:   122.0 ml LV SV MOD BP:    64.9 ml  RIGHT VENTRICLE             IVC RV Basal diam:  3.80 cm     IVC diam: 1.60 cm RV S prime:     11.50 cm/s TAPSE (M-mode): 1.9 cm  LEFT ATRIUM             Index        RIGHT ATRIUM           Index LA diam:        3.30 cm 1.67 cm/m   RA Area:     16.70 cm LA Vol (A2C):   35.7 ml 18.11 ml/m  RA Volume:   43.80 ml  22.22 ml/m LA Vol (A4C):   36.8 ml 18.67 ml/m LA Biplane Vol: 38.8 ml 19.68 ml/m AORTIC VALVE LVOT Vmax:   85.00 cm/s LVOT Vmean:  53.100 cm/s LVOT VTI:    0.147 m  AORTA Ao Root diam: 3.10 cm  MITRAL VALVE               TRICUSPID VALVE MV Area (PHT): 2.11 cm    TR Peak grad:   16.0 mmHg MV Decel Time: 360 msec    TR Vmax:        200.00 cm/s MV E velocity: 33.80 cm/s MV A velocity: 69.40 cm/s  SHUNTS MV E/A ratio:  0.49        Systemic VTI:  0.15 m Systemic Diam: 2.00 cm  Lennie Odor MD Electronically signed by Lennie Odor MD Signature Date/Time: 06/22/2022/2:01:42 PM    Final             Risk Assessment/Calculations:     HYPERTENSION CONTROL Vitals:   09/13/22 1503 09/13/22 1515  BP: (!) 142/90 (!) 140/80    The patient's blood pressure is elevated above target today.  In order to address the patient's elevated BP: Follow up with general cardiology has been recommended.; Labs and/or other diagnostics are currently pending prior to making blood pressure medication  adjustments. (Discussing with neurology)          Physical Exam:   VS:  BP (!) 140/80 Comment: home BP  Pulse 60   Ht 5' 10.5" (1.791 m)   Wt 155 lb (70.3 kg)   BMI 21.93 kg/m    Wt Readings from Last 3 Encounters:  09/13/22 155 lb (70.3 kg)  09/01/22 158 lb 6.4 oz (71.8 kg)  08/12/22 161 lb (73 kg)    GEN: Well nourished, well developed in no acute distress NECK: No JVD; No carotid bruits CARDIAC: RRR, no murmurs, rubs, gallops RESPIRATORY:  Clear to auscultation without rales, wheezing or rhonchi  ABDOMEN: Soft, non-tender, non-distended EXTREMITIES:  No edema; No deformity   ASSESSMENT AND PLAN: .    SVT - Diagnosed during 06/2022 admission.  Echo normal LVEF, no significant valvular abnormalities.  Continue Amiodarone 200mg  every day, Toprol 100mg  every day. Refills provided. No recent palpitations.  Will order 14-day ZIO to assess for any recurrent SVT. Amiodarone monitoring: TSH, CMP today. No evidence of toxicity.  Aortic atherosclerosis/ HLD, LDL goal <70 - Stable with no anginal symptoms. No indication for ischemic evaluation.  Pharmacy team recently added Vascepa, Wilber Bihari.   DM2 -06/2022 A1c 11.6. Continue to follow with PCP.  Recommend low carbohydrate diet and increasing physical activity.  HTN - BP not at goal <130/80.  Lisinopril and spironolactone held during recent admission due to AKI with subsequent normalization of renal function.  Labetalol was transitioned to Toprol for control of SVT during 06/2022 admission.  Presently on hydralazine 100 mg 3 times daily. He believes Spironolactone stopped due to interaction with anti-seizure med, I do not see interaction but will reach out to Dr. Karel Jarvis prior to resuming. Given his hyperaldosteronism, ideally would resume.  2014 renal duplex with no stenosis. CT 06/2022 normal adrenal glands. Hyperaldosteronism, as below.  Hyperaldosteronism / Hypokalemia- prior evaluation at [facility] in 2009 with aldosterone 18.9.  He was  started on spironolactone with subsequent normalization of aldosterone and improvement in BP control.  CT 06/2022 normal adrenal glands.  Hyperkalemia presently maintained on potassium supplement.  If able to resume spironolactone would be able to discontinue potassium supplement.       Dispo: follow up in 3 months  Signed, Alver Sorrow, NP

## 2022-09-13 NOTE — Patient Instructions (Addendum)
Medication Instructions:  Continue your current medications.   Alver Sorrow, NP will reach out to Dr. Karel Jarvis about resuming Spironolactone  *If you need a refill on your cardiac medications before your next appointment, please call your pharmacy*   Lab Work: Your physician recommends that you return for lab work today: CMP, thyroid panel  If you have labs (blood work) drawn today and your tests are completely normal, you will receive your results only by: MyChart Message (if you have MyChart) OR A paper copy in the mail If you have any lab test that is abnormal or we need to change your treatment, we will call you to review the results.  Follow-Up: At [facility], you and your health needs are our priority.  As part of our continuing mission to provide you with exceptional heart care, we have created designated Provider Care Teams.  These Care Teams include your primary Cardiologist (physician) and Advanced Practice Providers (APPs -  Physician Assistants and Nurse Practitioners) who all work together to provide you with the care you need, when you need it.  We recommend signing up for the patient portal called "MyChart".  Sign up information is provided on this After Visit Summary.  MyChart is used to connect with patients for Virtual Visits (Telemedicine).  Patients are able to view lab/test results, encounter notes, upcoming appointments, etc.  Non-urgent messages can be sent to your provider as well.   To learn more about what you can do with MyChart, go to [email or web address].    Your next appointment:   3 month(s)  Provider:   Chilton Si, MD or Gillian Shields, NP    Other Instructions  Heart Healthy Diet Recommendations: A low-salt diet is recommended. Meats should be grilled, baked, or boiled. Avoid fried foods. Focus on lean protein sources like fish or chicken with vegetables and fruits. The American Heart Association is a Chief Technology Officer!  American  Heart Association Diet and Lifeystyle Recommendations   Exercise recommendations: The American Heart Association recommends 150 minutes of moderate intensity exercise weekly. Try 30 minutes of moderate intensity exercise 4-5 times per week. This could include walking, jogging, or swimming.  Your physician has recommended that you wear a Zio monitor.   This monitor is a medical device that records the heart's electrical activity. Doctors most often use these monitors to diagnose arrhythmias. Arrhythmias are problems with the speed or rhythm of the heartbeat. The monitor is a small device applied to your chest. You can wear one while you do your normal daily activities. While wearing this monitor if you have any symptoms to push the button and record what you felt. Once you have worn this monitor for the period of time provider prescribed (Usually 14 days), you will return the monitor device in the postage paid box. Once it is returned they will download the data collected and provide [place] with a report which the provider will then review and we will call you with those results. Important tips:  Avoid showering during the first 24 hours of wearing the monitor. Avoid excessive sweating to help maximize wear time. Do not submerge the device, no hot tubs, and no swimming pools. Keep any lotions or oils away from the patch. After 24 hours you may shower with the patch on. Take brief showers with your back facing the shower head.  Do not remove patch once it has been placed because that will interrupt data and decrease adhesive wear time. Push the button when you  have any symptoms and write down what you were feeling. Once you have completed wearing your monitor, remove and place into box which has postage paid and place in your outgoing mailbox.  If for some reason you have misplaced your box then call our office and we can provide another box and/or mail it off for you.

## 2022-09-14 ENCOUNTER — Encounter (HOSPITAL_BASED_OUTPATIENT_CLINIC_OR_DEPARTMENT_OTHER): Payer: Self-pay | Admitting: Family

## 2022-09-15 ENCOUNTER — Ambulatory Visit: Payer: Medicare Other | Attending: Family

## 2022-09-15 ENCOUNTER — Ambulatory Visit
Admission: RE | Admit: 2022-09-15 | Discharge: 2022-09-15 | Disposition: A | Discharge status: Home / Self Care | Payer: Medicare Other | Source: Ambulatory Visit | Attending: Neurology | Admitting: Neurology

## 2022-09-15 DIAGNOSIS — I471 Supraventricular tachycardia, unspecified: Secondary | ICD-10-CM

## 2022-09-15 DIAGNOSIS — R9089 Other abnormal findings on diagnostic imaging of central nervous system: Secondary | ICD-10-CM

## 2022-09-15 DIAGNOSIS — G40201 Localization-related (focal) (partial) symptomatic epilepsy and epileptic syndromes with complex partial seizures, not intractable, with status epilepticus: Secondary | ICD-10-CM

## 2022-09-15 MED ORDER — GADOPICLENOL 0.5 MMOL/ML IV SOLN
7.0000 mL | Freq: Once | INTRAVENOUS | Status: AC | PRN
  Administered 2022-09-15: 7 mL via INTRAVENOUS

## 2022-09-15 NOTE — Progress Notes (Unsigned)
Enrolled for [employer] to mail a ZIO XT long term holter monitor to the patients address on file.   Dr. Oval Linsey to read.

## 2022-09-16 ENCOUNTER — Telehealth (HOSPITAL_BASED_OUTPATIENT_CLINIC_OR_DEPARTMENT_OTHER): Payer: Self-pay | Admitting: Family

## 2022-09-16 DIAGNOSIS — R002 Palpitations: Secondary | ICD-10-CM

## 2022-09-16 DIAGNOSIS — I10 Essential (primary) hypertension: Secondary | ICD-10-CM

## 2022-09-16 DIAGNOSIS — I471 Supraventricular tachycardia, unspecified: Secondary | ICD-10-CM

## 2022-09-17 MED ORDER — SPIRONOLACTONE 25 MG PO TABS: 25.0000 mg | ORAL_TABLET | Freq: Every day | ORAL | 3 refills | Status: DC

## 2022-09-17 NOTE — Telephone Encounter (Signed)
Left message for patient to call back  

## 2022-09-17 NOTE — Telephone Encounter (Addendum)
Returned call to patient, reviewed the following recommendations. Rx to preferred pharmacy. Labs ordered. Patient verbalized understanding.     "Discussed with Dr. Karel Jarvis.  Under indication of spironolactone and his antiseizure medications.  Will resume spironolactone at dose of 25 mg daily with BMP 1 to 2 weeks following initiation.   Will route to nursing team to review recommendations with Mr. Volante.   Alver Sorrow, NP "

## 2022-09-17 NOTE — Telephone Encounter (Signed)
Discussed with Dr. Karel Jarvis.  Under indication of spironolactone and his antiseizure medications.  Will resume spironolactone at dose of 25 mg daily with BMP 1 to 2 weeks following initiation.  Will route to nursing team to review recommendations with [name].  Kyle Sorrow, NP

## 2022-09-17 NOTE — Addendum Note (Signed)
Addended by: Marlene Lard on: 09/17/2022 02:37 PM   Modules accepted: Orders

## 2022-09-17 NOTE — Telephone Encounter (Signed)
Patient is returning call.  °

## 2022-09-18 DIAGNOSIS — I471 Supraventricular tachycardia, unspecified: Secondary | ICD-10-CM | POA: Diagnosis not present

## 2022-09-27 ENCOUNTER — Telehealth: Payer: Self-pay | Admitting: Neurology

## 2022-09-27 MED ORDER — CLONAZEPAM 0.25 MG PO TBDP: 0.2500 mg | ORAL_TABLET | Freq: Every day | ORAL | 5 refills | Status: DC

## 2022-09-27 NOTE — Telephone Encounter (Signed)
It was started in the hospital for seizures. I sent in refills, thanks

## 2022-09-27 NOTE — Telephone Encounter (Signed)
Callers wife stated Lu Duffel needs PA for medication refill of Clonapam 0.25MG  [facility]

## 2022-09-30 ENCOUNTER — Ambulatory Visit: Payer: Medicare Other | Admitting: Neurology

## 2022-10-01 ENCOUNTER — Emergency Department (HOSPITAL_COMMUNITY)
Admission: EM | Admit: 2022-10-01 | Discharge: 2022-10-01 | Disposition: A | Discharge status: Home / Self Care | Payer: Medicare Other | Attending: Emergency Medicine | Admitting: Emergency Medicine

## 2022-10-01 ENCOUNTER — Emergency Department (HOSPITAL_COMMUNITY): Payer: Medicare Other

## 2022-10-01 ENCOUNTER — Encounter (HOSPITAL_COMMUNITY): Payer: Self-pay

## 2022-10-01 ENCOUNTER — Telehealth: Payer: Self-pay | Admitting: Neurology

## 2022-10-01 ENCOUNTER — Other Ambulatory Visit: Payer: Self-pay

## 2022-10-01 DIAGNOSIS — G40909 Epilepsy, unspecified, not intractable, without status epilepticus: Secondary | ICD-10-CM | POA: Insufficient documentation

## 2022-10-01 DIAGNOSIS — R5383 Other fatigue: Secondary | ICD-10-CM

## 2022-10-01 DIAGNOSIS — E1165 Type 2 diabetes mellitus with hyperglycemia: Secondary | ICD-10-CM | POA: Insufficient documentation

## 2022-10-01 DIAGNOSIS — R531 Weakness: Secondary | ICD-10-CM | POA: Diagnosis not present

## 2022-10-01 DIAGNOSIS — Z794 Long term (current) use of insulin: Secondary | ICD-10-CM | POA: Diagnosis not present

## 2022-10-01 DIAGNOSIS — R739 Hyperglycemia, unspecified: Secondary | ICD-10-CM

## 2022-10-01 DIAGNOSIS — Z7984 Long term (current) use of oral hypoglycemic drugs: Secondary | ICD-10-CM | POA: Insufficient documentation

## 2022-10-01 LAB — COMPREHENSIVE METABOLIC PANEL
ALT: 18 U/L (ref 0–44)
AST: 15 U/L (ref 15–41)
Albumin: 3.7 g/dL (ref 3.5–5.0)
Alkaline Phosphatase: 96 U/L (ref 38–126)
Anion gap: 13 (ref 5–15)
BUN: 23 mg/dL (ref 8–23)
CO2: 18 mmol/L — ABNORMAL LOW (ref 22–32)
Calcium: 9.5 mg/dL (ref 8.9–10.3)
Chloride: 104 mmol/L (ref 98–111)
Creatinine, Ser: 0.95 mg/dL (ref 0.61–1.24)
GFR, Estimated: >60 mL/min (ref >60)
Glucose, Bld: 346 mg/dL — ABNORMAL HIGH (ref 70–99)
Potassium: 3.8 mmol/L (ref 3.5–5.1)
Sodium: 135 mmol/L (ref 135–145)
Total Bilirubin: 0.3 mg/dL (ref 0.3–1.2)
Total Protein: 7 g/dL (ref 6.5–8.1)

## 2022-10-01 LAB — CBC
HCT: 42.3 % (ref 39.0–52.0)
Hemoglobin: 14.1 g/dL (ref 13.0–17.0)
MCH: 30.8 pg (ref 26.0–34.0)
MCHC: 33.3 g/dL (ref 30.0–36.0)
MCV: 92.4 fL (ref 80.0–100.0)
Platelets: 299 10*3/uL (ref 150–400)
RBC: 4.58 MIL/uL (ref 4.22–5.81)
RDW: 12.4 % (ref 11.5–15.5)
WBC: 9.1 10*3/uL (ref 4.0–10.5)
nRBC: 0 % (ref 0.0–0.2)

## 2022-10-01 LAB — I-STAT CHEM 8, ED
BUN: 24 mg/dL — ABNORMAL HIGH (ref 8–23)
Calcium, Ion: 1.16 mmol/L (ref 1.15–1.40)
Chloride: 109 mmol/L (ref 98–111)
Creatinine, Ser: 0.9 mg/dL (ref 0.61–1.24)
Glucose, Bld: 348 mg/dL — ABNORMAL HIGH (ref 70–99)
HCT: 42 % (ref 39.0–52.0)
Hemoglobin: 14.3 g/dL (ref 13.0–17.0)
Potassium: 3.9 mmol/L (ref 3.5–5.1)
Sodium: 138 mmol/L (ref 135–145)
TCO2: 17 mmol/L — ABNORMAL LOW (ref 22–32)

## 2022-10-01 LAB — CBG MONITORING, ED
Glucose-Capillary: 366 mg/dL — ABNORMAL HIGH (ref 70–99)
Glucose-Capillary: 335 mg/dL — ABNORMAL HIGH (ref 70–99)
Glucose-Capillary: 237 mg/dL — ABNORMAL HIGH (ref 70–99)

## 2022-10-01 LAB — URINALYSIS, ROUTINE W REFLEX MICROSCOPIC
Bacteria, UA: NONE SEEN
Bilirubin Urine: NEGATIVE
Glucose, UA: >=500 mg/dL — AB
Hgb urine dipstick: NEGATIVE
Ketones, ur: NEGATIVE mg/dL
Leukocytes,Ua: NEGATIVE
Nitrite: NEGATIVE
Protein, ur: NEGATIVE mg/dL
Specific Gravity, Urine: 1.012 (ref 1.005–1.030)
pH: 5 (ref 5.0–8.0)

## 2022-10-01 MED ORDER — LACTATED RINGERS IV BOLUS
1000.0000 mL | Freq: Once | INTRAVENOUS | Status: AC
  Administered 2022-10-01: 1000 mL via INTRAVENOUS

## 2022-10-01 NOTE — Telephone Encounter (Signed)
This started on Sunday, was screened for UTI, pending. Off Fycompa x one month, no other changes per wife. Sleeping all day, yes hard to wake up on occasion. Please readvise.

## 2022-10-01 NOTE — Telephone Encounter (Signed)
His insulin was increased by PCP 20 units in am and in the evening extra 2 units.She is waiting on Pcp office to result UA.

## 2022-10-01 NOTE — Telephone Encounter (Signed)
I see the results of bloodwork from PCP 2 days ago, his glucose levels are very high again. Since hard to wake up, would recommend ER evaluation

## 2022-10-01 NOTE — Telephone Encounter (Signed)
Patient is sleeping all day per wife. Patient is currently on Lacosamide 100mg  bid , toprimate 150mg  1 tab po bid and clonzaepam 0.25@hs . No seizures, please advise and thank you

## 2022-10-01 NOTE — ED Notes (Signed)
Kyle [name] advised retake sugar

## 2022-10-01 NOTE — ED Provider Notes (Signed)
[facility] EMERGENCY DEPARTMENT AT [facility] Provider Note   CSN: [id number] Arrival date & time: 10/01/22  1543     History  No chief complaint on file.   Kyle [name] is a 67 y.o. male.  Patient is a 67 year old male with a past medical history of diabetes, seizure disorder presenting to the emergency department with weakness and fatigue.  Patient is here with his wife who states that since Sunday he has had increasing weakness and fatigue.  She states that he has been sleeping throughout the day and has been moving slower than usual and appears to be off balance when he is walking.  He states that he did have a fall on Sunday and thinks that he hit his head.  He denies any blood thinner use.  He denies any fevers or chills, nausea or vomiting or associated pain.  He denies any diarrhea, dysuria or hematuria.  He was seen by his primary doctor on Wednesday and was told his blood sugar was high but he had no signs of UTI and his insulin was increased however despite the medication changes he has had no improvement of his symptoms.  He reports for his seizures he would have flashes of light in his eye with episodes of confusion following and reports that he has not had any of these episodes over the last several days.  The history is provided by the patient and the spouse.       Home Medications Prior to Admission medications   Medication Sig Start Date End Date Taking? Authorizing Provider  amiodarone (PACERONE) 200 MG tablet Take 1 tablet (200 mg total) by mouth daily. 09/13/22   Alver Sorrow, NP  clonazePAM (KLONOPIN) 0.25 MG disintegrating tablet Take 1 tablet (0.25 mg total) by mouth at bedtime. 09/27/22 10/27/22  Van Clines, MD  gemfibrozil (LOPID) 600 MG tablet Take 1 tablet (600 mg total) by mouth 2 (two) times daily before a meal. 06/07/17   Jeffery, Chelle, PA  glucose blood test strip Use as instructed Patient taking differently: 1 each by Other route  See admin instructions. Use as instructed 12/30/15   Porfirio Oar, PA  hydrALAZINE (APRESOLINE) 100 MG tablet Take 100 mg by mouth 3 (three) times daily.    [provider]  insulin aspart (NOVOLOG) 100 UNIT/ML injection Inject 4 Units into the skin 3 (three) times daily with meals. 07/09/22   Dorcas Carrow, MD  Insulin Glargine (BASAGLAR KWIKPEN [place]) Inject 14 Units into the skin once.    [provider]  Lacosamide 100 MG TABS Take 1 tablet twice a day 09/06/22   Nita Sickle K, DO  Lancets 30G MISC Use to check home glucose daily and as needed Patient taking differently: 1 each by Other route See admin instructions. Use to check home glucose daily and as needed for blood sugar 12/30/15   Porfirio Oar, PA  metFORMIN (GLUCOPHAGE) 500 MG tablet Take 2 tablets (1,000 mg total) by mouth 2 (two) times daily with a meal. 12/07/16   Porfirio Oar, PA  metoprolol succinate (TOPROL-XL) 100 MG 24 hr tablet Take 1 tablet (100 mg total) by mouth daily. Take with or immediately following a meal. 09/13/22 10/13/22  Alver Sorrow, NP  potassium chloride SA (KLOR-CON M) 20 MEQ tablet Take 20 mEq by mouth daily.    [provider]  spironolactone (ALDACTONE) 25 MG tablet Take 1 tablet (25 mg total) by mouth daily. 09/17/22 12/16/22  Alver Sorrow, NP  topiramate (TOPAMAX) 100 MG tablet Take 1 and 1/2 tablets twice a day 08/03/22   Van Clines, MD  VASCEPA 1 g capsule Take 2 capsules (2 g total) by mouth 2 (two) times daily. 07/30/22   Chilton Si, MD      Allergies    Statins and Gabapentin    Review of Systems   Review of Systems  Physical Exam Updated Vital Signs BP (!) 178/99   Pulse 66   Temp (!) 97.4 F (36.3 C) (Oral)   Resp 14   Ht 5' 10.5" (1.791 m)   Wt 70.3 kg   SpO2 100%   BMI 21.93 kg/m  Physical Exam Vitals and nursing note reviewed.  Constitutional:      General: He is not in acute distress.    Appearance: Normal appearance.  HENT:      Head: Normocephalic and atraumatic.     Nose: Nose normal.     Mouth/Throat:     Mouth: Mucous membranes are moist.     Pharynx: Oropharynx is clear.  Eyes:     Extraocular Movements: Extraocular movements intact.     Conjunctiva/sclera: Conjunctivae normal.     Pupils: Pupils are equal, round, and reactive to light.  Cardiovascular:     Rate and Rhythm: Normal rate and regular rhythm.     Pulses: Normal pulses.     Heart sounds: Normal heart sounds.  Pulmonary:     Effort: Pulmonary effort is normal.     Breath sounds: Normal breath sounds.  Abdominal:     General: Abdomen is flat.     Palpations: Abdomen is soft.     Tenderness: There is no abdominal tenderness.  Musculoskeletal:        General: Normal range of motion.     Cervical back: Normal range of motion.  Skin:    General: Skin is warm and dry.  Neurological:     General: No focal deficit present.     Mental Status: He is alert and oriented to person, place, and time.     Cranial Nerves: No cranial nerve deficit.     Sensory: No sensory deficit.     Motor: No weakness.     Coordination: Coordination normal.     Gait: Gait abnormal (Mildly unsteady).     Comments: Negative Romberg  Psychiatric:        Mood and Affect: Mood normal.        Behavior: Behavior normal.     ED Results / Procedures / Treatments   Labs (all labs ordered are listed, but only abnormal results are displayed) Labs Reviewed  COMPREHENSIVE METABOLIC PANEL - Abnormal; Notable for the following components:      Result Value   CO2 18 (*)    Glucose, Bld 346 (*)    All other components within normal limits  URINALYSIS, ROUTINE W REFLEX MICROSCOPIC - Abnormal; Notable for the following components:   Glucose, UA >=500 (*)    All other components within normal limits  CBG MONITORING, ED - Abnormal; Notable for the following components:   Glucose-Capillary 366 (*)    All other components within normal limits  I-STAT CHEM 8, ED - Abnormal;  Notable for the following components:   BUN 24 (*)    Glucose, Bld 348 (*)    TCO2 17 (*)    All other components within normal limits  CBG MONITORING, ED - Abnormal; Notable for the following components:   Glucose-Capillary 335 (*)  All other components within normal limits  CBG MONITORING, ED - Abnormal; Notable for the following components:   Glucose-Capillary 237 (*)    All other components within normal limits  CBC    EKG EKG Interpretation Date/Time:  Friday October 01 2022 19:52:58 EDT Ventricular Rate:  62 PR Interval:  220 QRS Duration:  100 QT Interval:  434 QTC Calculation: 441 R Axis:   64  Text Interpretation: Sinus rhythm Prolonged PR interval Abnormal R-wave progression, early transition Abnormal inferior Q waves No significant change since last tracing Confirmed by Elayne Snare ([id number]) on 10/01/2022 8:03:36 PM  Radiology CT Head Wo Contrast  Result Date: 10/01/2022 CLINICAL DATA:  Mental status change EXAM: CT HEAD WITHOUT CONTRAST TECHNIQUE: Contiguous axial images were obtained from the base of the skull through the vertex without intravenous contrast. RADIATION DOSE REDUCTION: This exam was performed according to the departmental dose-optimization program which includes automated exposure control, adjustment of the mA and/or kV according to patient size and/or use of iterative reconstruction technique. COMPARISON:  MRI head 09/15/2022 FINDINGS: Brain: No evidence of acute infarction, hemorrhage, hydrocephalus, extra-axial collection or mass lesion/mass effect. Vascular: Atherosclerotic calcifications are present within the cavernous internal carotid arteries. Skull: Normal. Negative for fracture or focal lesion. Sinuses/Orbits: No acute finding. Other: None. IMPRESSION: No acute intracranial abnormality. Electronically Signed   By: Darliss Cheney M.D.   On: 10/01/2022 19:33    Procedures Procedures    Medications Ordered in ED Medications  lactated ringers  bolus 1,000 mL (0 mLs Intravenous Stopped 10/01/22 2120)    ED Course/ Medical Decision Making/ A&P Clinical Course as of 10/01/22 2209  Fri Oct 01, 2022  2151 Repeat glucose improved, UA negative. He is stable for discharge home with outpatient follow up. [VK]    Clinical Course User Index [VK] Rexford Maus, DO                                 Medical Decision Making This patient presents to the ED with chief complaint(s) of weakness, fatigue with pertinent past medical history of DM, seizure disorder which further complicates the presenting complaint. The complaint involves an extensive differential diagnosis and also carries with it a high risk of complications and morbidity.    The differential diagnosis includes dehydration, electrolyte abnormality, hypo or hyperglycemia, ICH, mass effect, CVA though less likely without focal deficits, infection, anemia, arrhythmia  Additional history obtained: Additional history obtained from family Records reviewed Primary Care Documents and neurology records  ED Course and Reassessment: On patient's arrival to the emergency department he is hemodynamically stable in no acute distress fully oriented.  He was initially evaluated by triage and had labs performed that showed hyperglycemia and a mildly low bicarb without evidence of DKA.  The patient will have EKG as well as head CT to further evaluate for causes of his weakness and fatigue.  He was given IV fluids for his hyperglycemia and will be closely reassessed.  Independent labs interpretation:  The following labs were independently interpreted: hyperglycemia, mildly low bicarb, no signs of DKA  Independent visualization of imaging: -N/A  Consultation: - Consulted or discussed management/test interpretation w/ external professional: N/A  Consideration for admission or further workup: Patient has no emergent conditions requiring admission or further work-up at this time and is stable  for discharge home with primary care follow-up  Social Determinants of health: N/A    Amount and/or Complexity  of Data Reviewed Labs: ordered. Radiology: ordered.          Final Clinical Impression(s) / ED Diagnoses Final diagnoses:  Hyperglycemia  Fatigue, unspecified type    Rx / DC Orders ED Discharge Orders     None         Rexford Maus, DO 10/01/22 2209

## 2022-10-01 NOTE — ED Triage Notes (Signed)
Pt came in via POV d/t hyperglycemia. He saw his PCP yesterday & his insulin was increased, but family reports he has been lethargic more than usual lately & confused. Pt denies any pain or n/v. A/Ox3, confused to location.

## 2022-10-01 NOTE — Telephone Encounter (Signed)
Pls check when this started, because she had actually decreased his medication. Did this start when he got off the Fycompa? Is he sleeping at night. Does he wake up at all during the day or is hard to wake up?

## 2022-10-01 NOTE — Discharge Instructions (Signed)
You were seen in the emergency department for your high blood sugar and your fatigue.  Your blood sugar was high here but your electrolytes were otherwise normal and you have no signs of complication from your blood sugar being high or infection.  We gave you fluids with improvement of your blood sugar and you should continue to take your home insulin as prescribed.  You should follow-up with your primary doctor in the next few days to have your blood sugar rechecked to see if you need to have any other changes to your medicine.  You should return to the emergency department if you have a sugar that is too high to read on your monitor, you are too weak to get up and walk, you have repetitive vomiting or if you have any other new or concerning symptoms.

## 2022-10-01 NOTE — Telephone Encounter (Signed)
Caller requested if nurse could give her a call. Stated its urgent

## 2022-10-04 ENCOUNTER — Telehealth: Payer: Self-pay

## 2022-10-04 NOTE — Telephone Encounter (Signed)
Pt called no answer left a voice mail to call the office back  °

## 2022-10-04 NOTE — Telephone Encounter (Signed)
-----   Message from Van Clines sent at 10/04/2022  8:29 AM EDT ----- Regarding: f/u He went to the ER. Can you pls check on him and see how he is doing? If no change in the daytime sleepiness and falling, would do a 1-hour EEG. [facility] has several openings tomorrow. Thanks! ----- Message ----- From: Jonetta Osgood Sent: 10/04/2022   7:45 AM EDT To: Van Clines, MD

## 2022-10-05 NOTE — Telephone Encounter (Signed)
Spoke with pt wife she stated that his blood sugar has been out of control and that his PCP has increase his insuline 2 units, pt is scheduled for EEG on 10/07/22 at 2:30 after his appointment with dr Karel Jarvis

## 2022-10-07 ENCOUNTER — Other Ambulatory Visit: Payer: Self-pay

## 2022-10-07 ENCOUNTER — Other Ambulatory Visit: Payer: Medicare Other

## 2022-10-07 ENCOUNTER — Other Ambulatory Visit (HOSPITAL_COMMUNITY): Payer: Self-pay

## 2022-10-07 ENCOUNTER — Encounter: Payer: Self-pay | Admitting: Neurology

## 2022-10-07 ENCOUNTER — Ambulatory Visit (INDEPENDENT_AMBULATORY_CARE_PROVIDER_SITE_OTHER): Payer: Medicare Other | Admitting: Neurology

## 2022-10-07 VITALS — BP 167/81 | HR 65 | Ht 70.5 in | Wt 154.6 lb

## 2022-10-07 DIAGNOSIS — G40201 Localization-related (focal) (partial) symptomatic epilepsy and epileptic syndromes with complex partial seizures, not intractable, with status epilepticus: Secondary | ICD-10-CM

## 2022-10-07 DIAGNOSIS — R9089 Other abnormal findings on diagnostic imaging of central nervous system: Secondary | ICD-10-CM | POA: Diagnosis not present

## 2022-10-07 MED ORDER — TOPIRAMATE 100 MG PO TABS: ORAL_TABLET | ORAL | 3 refills | Status: DC

## 2022-10-07 MED ORDER — LACOSAMIDE 100 MG PO TABS
100.0000 mg | ORAL_TABLET | Freq: Two times a day (BID) | ORAL | 3 refills | Status: DC
  Filled 2022-10-07: qty 180, 90d supply, fill #0
  Filled 2023-01-12: qty 180, 90d supply, fill #1
  Filled 2023-04-08 – 2023-04-11 (×2): qty 180, 90d supply, fill #2

## 2022-10-07 MED ORDER — CLONAZEPAM 0.25 MG PO TBDP: 0.2500 mg | ORAL_TABLET | Freq: Every day | ORAL | 3 refills | Status: DC

## 2022-10-07 NOTE — Progress Notes (Signed)
NEUROLOGY FOLLOW UP OFFICE NOTE  Kyle [name] [id number] 07-09-1955  HISTORY OF PRESENT ILLNESS: I had the pleasure of seeing Kyle [name] in follow-up in the neurology clinic on 10/07/2022. He is again accompanied by his wife Kyle [name] who helps supplement the history today. The patient was last seen a month ago for new onset seizures when he presented in focal status epilepticus in 06/2022 in the setting of non-ketotic hyperglycemia. 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 He had been doing well seizure-free since 06/2022, repeat EEG in 07/2022 was normal. We weaned off Fycompa due to cost. He is on Topiramate 150mg  BID, Vimpat 200mg  BID, and clonazepam 0.25mg  at bedtime. I personally reviewed follow-up brain MRI with and without contrast done 09/26/2022 which did not show any acute changes. There is magnetic susceptibility effect in the left occipital lobe with mild associated contrast enhancement that appears leptomeningeal, in close proximity to the region of signal change on prior MRI. This is noted to be new but may be a sequela of prior ischemia or hemorrhage. His wife called on 8/16 to report a change in symptoms, he was having increasing weakness and fatigue, sleeping all day. He fell and thinks he hit his head. His glucose levels have been high and insulin adjusted by PCP. He went to the ER where head CT did not show any acute changes. Glucose was 346., UA glucose >500.  He is doing better today. Kyle [name] notes that the confusion/off balance symptoms are much better. His glucose levels are also better, in the 145s. No seizures or seizure-like symptoms. They deny any vision changes, staring/unresponsive episodes, gaps in time, olfactory/gustatory hallucinations, focal numbness/tingling/weakness, myoclonic jerks. No headaches, dizziness, vision changes, no  falls.    History On Initial Assessment 08/03/2022: This is a pleasant 67 year old right-handed man with a history of hypertension, hyperlipidemia, DM, PAD, presenting for evaluation of new onset seizure. He was in the ER on 4/3 reporting 2 days of headache and right-sided vision changes. He describes intermittent flashing lights with 2 squares of primary colors on the right visual field lasting 30 seconds to 5 minutes. He would get confused, starting to talk then forget what he was trying to say. No focal numbness/tingling/weakness, jerking movements. He put his hand on the right inferior quadrant and would see a shadow even when the flashes stopped. His wife noticed he would look to the right side and rub his fingers. His glucose levels were in the 300s to 400s due to medication cost. He had a brain MRI without contrast with no acute changes reported. He was back in the ER on 06/21/22 due to an increase in vision changes. During evaluation, he would become very slow to respond but would still be able to interact somewhat. There was variable right gaze preference during these events. He reported flashing lights on the right side of his vision and had a left inferior quadrantanopia on exam even after episodes resolved. MRI brain from 4/3 was reviewed and it was noted that there was a small curvilinear focus of susceptibility artifact in the left occipital lobe, likely a small focus of chronic hemosiderin staining that could serve as seizure focus. 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 He underwent video EEG monitoring from 5/6 to 5/15 with continued seizures occurring every hour, last  seizure was 06/29/22. He required multiple seizure medications with increasing doses for seizure control, Levetiracetam, Vimpat, Topiramate, clonazepam, Perampanel. He became increasingly sedated and Levetiracetam was weaned off,  clonazepam reduced, and Topamax stopped due to hyperammonemia. Overnight EEG on 5/20-21 showed diffuse slowing. Repeat brain MRI with and without contrast on 06/29/22 again showed subtle abnormality in the left occipital lobe with mildly heterogeneous diffusion and FLAIR, but also a larger area of asymmetrically decreased T2 white matter signal. He had a lumbar puncture with normal CSF, including paraneoplastic panel. Serum paraneoplastic panel also negative. CT chest/abdomen/pelvis showed a heterogeneously enhancing mass in the right kidney. Symptoms favoring non-ketotic hyperglycemic occipital seizures. It was recommended to repeat brain MRI and ambulatory EEG to help guide weaning of seizure medications. He was discharged home on 5/24 with note of poorly controlled DM, HbA1c was 11.6. Seizure medications on discharge were Topiramate 150mg  BID, Lacosamide 200mg  BID, Perampanel 4mg  at bedtime, and clonazepam 0.25mg  at bedtime with prn clonazepam for seizure rescue that he has not needed.   He initially had a headache but these have resolved. He has not had any visual changes since hospital discharge. He denies any prior history of seizures. He denies any headaches, dizziness. He has ran out of Fycompa for the past 4 days. He continues to fight with insurance for his DM medications. He has not been sleeping well, new since the seizures. No daytime drowsiness. He feels memory is good.   Epilepsy Risk Factors:  His mother had a few seizures later in life.He had a normal birth and early development.  There is no history of febrile convulsions, CNS infections such as meningitis/encephalitis, significant traumatic brain injury, neurosurgical procedures.  Prior ASMs: Levetiracetam, Vimpat  PAST MEDICAL HISTORY: Past Medical History:  Diagnosis Date   Cataracts, bilateral    Diabetes mellitus without complication (HCC)    DM (diabetes mellitus) (HCC)    History of basal cell carcinoma of skin    shoulder    HLD (hyperlipidemia)    Hypertension    Migraines    PAD (peripheral artery disease) (HCC)    Prediabetes     MEDICATIONS: Current Outpatient Medications on File Prior to Visit  Medication Sig Dispense Refill   amiodarone (PACERONE) 200 MG tablet Take 1 tablet (200 mg total) by mouth daily. 90 tablet 1   clonazePAM (KLONOPIN) 0.25 MG disintegrating tablet Take 1 tablet (0.25 mg total) by mouth at bedtime. 30 tablet 5   gemfibrozil (LOPID) 600 MG tablet Take 1 tablet (600 mg total) by mouth 2 (two) times daily before a meal. 180 tablet 1   glucose blood test strip Use as instructed (Patient taking differently: 1 each by Other route See admin instructions. Use as instructed) 100 each 12   hydrALAZINE (APRESOLINE) 100 MG tablet Take 100 mg by mouth 3 (three) times daily.     insulin aspart (NOVOLOG) 100 UNIT/ML injection Inject 4 Units into the skin 3 (three) times daily with meals. 10 mL 11   Insulin Glargine (BASAGLAR KWIKPEN [place]) Inject 14 Units into the skin once.     Lacosamide 100 MG TABS Take 1 tablet twice a day 60 tablet 0   Lancets 30G MISC Use to check home glucose daily and as needed (Patient taking differently: 1 each by Other route See admin instructions. Use to check home glucose daily and as needed for blood sugar) 100 each prn   metFORMIN (GLUCOPHAGE) 500 MG tablet Take 2 tablets (1,000 mg total) by mouth 2 (two)  times daily with a meal. 360 tablet 3   metoprolol succinate (TOPROL-XL) 100 MG 24 hr tablet Take 1 tablet (100 mg total) by mouth daily. Take with or immediately following a meal. 90 tablet 1   potassium chloride SA (KLOR-CON M) 20 MEQ tablet Take 20 mEq by mouth daily.     spironolactone (ALDACTONE) 25 MG tablet Take 1 tablet (25 mg total) by mouth daily. 90 tablet 3   topiramate (TOPAMAX) 100 MG tablet Take 1 and 1/2 tablets twice a day 270 tablet 3   VASCEPA 1 g capsule Take 2 capsules (2 g total) by mouth 2 (two) times daily. 360 capsule 3   No current  facility-administered medications on file prior to visit.    ALLERGIES: Allergies  Allergen Reactions   Statins     Leg cramps   Gabapentin Palpitations    FAMILY HISTORY: Family History  Problem Relation Age of Onset   Diabetes Father    Hypertension Father    Hypertension Mother    Hypertension Sister    Diabetes Sister        diet controlled    SOCIAL HISTORY: Social History   Socioeconomic History   Marital status: Married    Spouse name: Dennie Bible   Number of children: 2   Years of education: GED   Highest education level: Not on file  Occupational History   Occupation: retired 07/2015    Comment: former truck Hospital doctor, Museum/gallery exhibitions officer   Occupation: drives cars    Comment: [employer], Wednesdays   Occupation: Parts Delivery    Comment: [employer], 3 days/week  Tobacco Use   Smoking status: Former    Types: Cigarettes    Start date: 11/05/2007   Smokeless tobacco: Never  Vaping Use   Vaping status: Never Used  Substance and Sexual Activity   Alcohol use: No    Alcohol/week: 0.0 standard drinks of alcohol   Drug use: No   Sexual activity: Yes    Partners: Female    Birth control/protection: None  Other Topics Concern   Not on file  Social History Narrative   Lives with his wife and their pets.   Daughters live in [place] and [place].   Son lives in [place].   Enjoying doing work around the house and yard.   Is thinking about part time work at some time.   Right handed   Social Determinants of Health   Financial Resource Strain: Low Risk  (05/25/2022)   Received from [facility], [facility]   Overall Financial Resource Strain (CARDIA)    Difficulty of Paying Living Expenses: Not very hard  Food Insecurity: No Food Insecurity (06/21/2022)   Hunger Vital Sign    Worried About Running Out of Food in the Last Year: Never true    Ran Out of Food in the Last Year: Never true  Transportation Needs: No Transportation Needs (06/21/2022)    PRAPARE - Administrator, Civil Service (Medical): No    Lack of Transportation (Non-Medical): No  Physical Activity: Insufficiently Active (05/25/2022)   Received from [facility], [facility]   Exercise Vital Sign    Days of Exercise per Week: 4 days    Minutes of Exercise per Session: 20 min  Stress: No Stress Concern Present (05/25/2022)   Received from [facility], [facility] - Occupational Stress Questionnaire    Feeling of Stress : Not at all  Social Connections: Socially Integrated (05/25/2022)   Received from [facility], [facility]   Social Network    How would you rate your social network (family, work, friends)?: Good participation with social networks  Intimate Partner Violence: Not At Risk (06/21/2022)   Humiliation, Afraid, Rape, and Kick questionnaire    Fear of Current or Ex-Partner: No    Emotionally Abused: No    Physically Abused: No    Sexually Abused: No     PHYSICAL EXAM: Vitals:   10/07/22 1152 10/07/22 1158  BP: (!) 175/85 (!) 167/81  Pulse: 65   SpO2: 98%    General: No acute distress Head:  Normocephalic/atraumatic Skin/Extremities: No rash, no edema, healing abrasions on right forearm Neurological Exam: alert and awake. No aphasia or dysarthria. Fund of knowledge is appropriate.Attention and concentration are normal.   Cranial nerves: Pupils equal, round. Extraocular movements intact with no nystagmus. Visual fields full.  No facial asymmetry.  Motor: Bulk and tone normal, muscle strength 5/5 throughout with no pronator drift.   Finger to nose testing intact.  Gait narrow-based and steady, no ataxia. 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 He had focal sensory seizures with visual features  in the setting of non-ketotic hyperglycemia. MRI brain showed increased signal in the left occipital lobe, repeat MRI reports magnetic susceptibility effect in the left occipital lobe with mild associated contrast enhancement that appears leptomeningeal, in close proximity to the region of signal change on prior MRI. This is noted to be new but may be a sequela of prior ischemia or hemorrhage. We will do repeat imaging in 6 months to assess stability. He is doing much better, the imbalance and confusion recently have resolved, likely due to uncontrolled hyperglycemia. No indication of seizures. Continue Topiramate 150mg  BID, Lacosamide 100mg  BID, and clonazepam 0.25mg  at bedtime. Continue glucose control. He is aware of [place] driving laws to stop driving after a seizure until 6 months seizure-free. Follow-up in 3 months, call for any changes.    Thank you for allowing me to participate in his care.  Please do not hesitate to call for any questions or concerns.    Patrcia Dolly, M.D.   CC: Porfirio Oar, [place]

## 2022-10-07 NOTE — Patient Instructions (Signed)
Good to see you doing better. Continue all your medications. We will plan for repeating brain MRI in 6 months. Continue glucose control. Follow-up in 3 months, call for any changes.    Seizure Precautions: 1. If medication has been prescribed for you to prevent seizures, take it exactly as directed.  Do not stop taking the medicine without talking to your doctor first, even if you have not had a seizure in a long time.   2. Avoid activities in which a seizure would cause danger to yourself or to others.  Don't operate dangerous machinery, swim alone, or climb in high or dangerous places, such as on ladders, roofs, or girders.  Do not drive unless your doctor says you may.  3. If you have any warning that you may have a seizure, lay down in a safe place where you can't hurt yourself.    4.  No driving for 6 months from last seizure, as per [facility].   Please refer to the following link on the [employer]ile[employer] of [employer]'s website for more information: http://www.epilepsyfoundation.org/answerplace/Social/driving/drivingu.cfm   5.  Maintain good sleep hygiene. Avoid alcohol.  6.  Contact your doctor if you have any problems that may be related to the medicine you are taking.  7.  Call 911 and bring the patient back to the ED if:        A.  The seizure lasts longer than 5 minutes.       B.  The patient doesn't awaken shortly after the seizure  C.  The patient has new problems such as difficulty seeing, speaking or moving  D.  The patient was injured during the seizure  E.  The patient has a temperature over 102 F (39C)  F.  The patient vomited and now is having trouble breathing

## 2022-10-11 ENCOUNTER — Other Ambulatory Visit (HOSPITAL_COMMUNITY): Payer: Self-pay

## 2022-10-14 ENCOUNTER — Encounter: Payer: Self-pay | Admitting: Neurology

## 2022-10-21 ENCOUNTER — Ambulatory Visit: Payer: Medicare Other | Admitting: Neurology

## 2022-10-29 ENCOUNTER — Other Ambulatory Visit: Payer: Self-pay | Admitting: Urology

## 2022-10-29 DIAGNOSIS — D4101 Neoplasm of uncertain behavior of right kidney: Secondary | ICD-10-CM

## 2022-10-29 DIAGNOSIS — D4102 Neoplasm of uncertain behavior of left kidney: Secondary | ICD-10-CM

## 2022-10-29 DIAGNOSIS — D360 Benign neoplasm of lymph nodes: Secondary | ICD-10-CM

## 2022-11-09 ENCOUNTER — Encounter (HOSPITAL_BASED_OUTPATIENT_CLINIC_OR_DEPARTMENT_OTHER): Payer: Self-pay

## 2022-11-09 ENCOUNTER — Telehealth (HOSPITAL_BASED_OUTPATIENT_CLINIC_OR_DEPARTMENT_OTHER): Payer: Self-pay

## 2022-11-09 NOTE — Telephone Encounter (Addendum)
Left message for patient to call back and results mailed to patient.    ----- Message from Alver Sorrow sent at 11/08/2022  2:27 PM EDT ----- Monitor with predominantly normal sinus rhythm.  There were 2 brief episodes of SVT which is fast heart rate occurring the top chamber of the heart.  These episodes were asymptomatic.  Continue amiodarone 200 mg daily, Toprol 100 mg daily for management of SVT.

## 2022-11-09 NOTE — Telephone Encounter (Signed)
Follow Up:      Patient is returning Kaila's call from today.

## 2022-11-09 NOTE — Telephone Encounter (Signed)
This encounter was created in error - please disregard.

## 2022-11-09 NOTE — Telephone Encounter (Signed)
Advised patient, verbalized understanding

## 2022-11-12 ENCOUNTER — Ambulatory Visit
Admission: RE | Admit: 2022-11-12 | Discharge: 2022-11-12 | Disposition: A | Discharge status: Home / Self Care | Payer: Medicare Other | Source: Ambulatory Visit | Attending: Urology

## 2022-11-12 ENCOUNTER — Ambulatory Visit: Payer: Medicare Other

## 2022-11-12 DIAGNOSIS — D4102 Neoplasm of uncertain behavior of left kidney: Secondary | ICD-10-CM

## 2022-11-12 DIAGNOSIS — D360 Benign neoplasm of lymph nodes: Secondary | ICD-10-CM

## 2022-11-12 DIAGNOSIS — D4101 Neoplasm of uncertain behavior of right kidney: Secondary | ICD-10-CM

## 2022-11-19 ENCOUNTER — Ambulatory Visit: Payer: Medicare Other

## 2022-11-19 VITALS — BP 161/77 | HR 57 | Temp 97.4°F | Resp 16 | Ht 70.5 in | Wt 155.0 lb

## 2022-11-19 DIAGNOSIS — E782 Mixed hyperlipidemia: Secondary | ICD-10-CM

## 2022-11-19 MED ORDER — INCLISIRAN SODIUM 284 MG/1.5ML ~~LOC~~ SOSY
284.0000 mg | PREFILLED_SYRINGE | Freq: Once | SUBCUTANEOUS | Status: AC
  Administered 2022-11-19: 284 mg via SUBCUTANEOUS
  Filled 2022-11-19: qty 1.5

## 2022-11-19 NOTE — Progress Notes (Signed)
Diagnosis: Hyperlipidemia  Provider:  Chilton Greathouse MD  Procedure: Injection  Leqvio (inclisiran), Dose: 284 mg, Site: subcutaneous, Number of injections: 1  Post Care:  right arm injection  Discharge: Condition: Good, Destination: Home . AVS Provided  Performed by:  Rico Ala, LPN

## 2022-11-26 ENCOUNTER — Telehealth: Payer: Self-pay | Admitting: Pharmacist Clinician (PhC)/ Clinical Pharmacy Specialist

## 2022-11-26 DIAGNOSIS — E785 Hyperlipidemia, unspecified: Secondary | ICD-10-CM

## 2022-11-26 NOTE — Telephone Encounter (Signed)
Labs ordered - [employer]

## 2022-12-11 ENCOUNTER — Ambulatory Visit
Admission: RE | Admit: 2022-12-11 | Discharge: 2022-12-11 | Disposition: A | Discharge status: Home / Self Care | Payer: Medicare Other | Source: Ambulatory Visit | Attending: Urology | Admitting: Urology

## 2022-12-11 DIAGNOSIS — D4101 Neoplasm of uncertain behavior of right kidney: Secondary | ICD-10-CM

## 2022-12-11 DIAGNOSIS — D4102 Neoplasm of uncertain behavior of left kidney: Secondary | ICD-10-CM

## 2022-12-11 MED ORDER — GADOPICLENOL 0.5 MMOL/ML IV SOLN
7.5000 mL | Freq: Once | INTRAVENOUS | Status: AC | PRN
  Administered 2022-12-11: 7 mL via INTRAVENOUS

## 2022-12-20 ENCOUNTER — Encounter (HOSPITAL_BASED_OUTPATIENT_CLINIC_OR_DEPARTMENT_OTHER): Payer: Self-pay | Admitting: Family

## 2022-12-20 ENCOUNTER — Ambulatory Visit (INDEPENDENT_AMBULATORY_CARE_PROVIDER_SITE_OTHER): Payer: Medicare Other | Admitting: Family

## 2022-12-20 VITALS — BP 180/84 | HR 62 | Ht 70.0 in | Wt 162.0 lb

## 2022-12-20 DIAGNOSIS — I10 Essential (primary) hypertension: Secondary | ICD-10-CM | POA: Diagnosis not present

## 2022-12-20 DIAGNOSIS — E785 Hyperlipidemia, unspecified: Secondary | ICD-10-CM

## 2022-12-20 DIAGNOSIS — I471 Supraventricular tachycardia, unspecified: Secondary | ICD-10-CM

## 2022-12-20 DIAGNOSIS — Z79899 Other long term (current) drug therapy: Secondary | ICD-10-CM | POA: Diagnosis not present

## 2022-12-20 DIAGNOSIS — E269 Hyperaldosteronism, unspecified: Secondary | ICD-10-CM

## 2022-12-20 NOTE — Progress Notes (Signed)
Cardiology Office Note:  .   Date:  12/20/2022  ID:  Kyle [name], DOB 1955/04/09, MRN [id number] PCP: Porfirio Oar, PA  [facility] Providers Cardiologist:  Chilton Si, MD    History of Present Illness: .   Kyle [name] is a 67 y.o. male with history of DM 2, HTN, HLD, PAD, SVT, seizure, hyperaldosteronism.   ED visit 05/18/2022 with visual disturbance x 2 days with CT head, MRI brain, neurological test unremarkable.  Presented 06/20/2022 with palpitations, lightheadedness, dizziness, presyncopal episodes.  Found to be in SVT.  He also had focal seizures.  MRI brain 5/14 septal abnormality left occipital lobe.  Continuous EEG demonstrated seizures.  He was started on Toprol, amiodarone for SVT.  Labetalol was discontinued.  Lisinopril, spironolactone held due to AKI which normalized prior to discharge.  Echocardiogram during admission normal LVEF, no significant valvular maladies.  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  His A1c was 11.6 and was discharged on insulin.   He saw pharmacy team 07/23/2022 for lipid management and was started on Vascepa and Leqvio. At visit 09/13/22 kidney function stable and Spironolactone resumed at 25mg  daily.    Presents today for follow-up with his wife.  No recurrent seizure on present antiseizure medications.Not taking Vascepa as was cost prohibitive. Blood pressure at home 128/72. Inquires if Spironolactone needs increased. Getting potassium at [facility] OTC, discussed likely not as potent as Rx strength. Upcoming surgery in December with Dr. Sande Brothers for removal of kidney tumor. Reports no shortness of breath nor dyspnea on exertion. Reports no chest pain, pressure, or tightness. No edema, orthopnea, PND. Reports no palpitations.    ROS: Please see the history of present illness.    All other systems reviewed and are negative.   Studies Reviewed: .        Cardiac Studies & Procedures        ECHOCARDIOGRAM  ECHOCARDIOGRAM COMPLETE 06/22/2022  Narrative ECHOCARDIOGRAM REPORT    Patient Name:   Kyle [name] Date of Exam: 06/22/2022 Medical Rec #:  [id number]         Height:       70.0 in Accession #:    [id number]        Weight:       174.8 lb Date of Birth:  [birth year]         BSA:          1.971 m Patient Age:    66 years          BP:           115/75 mmHg Patient Gender: M                 HR:           75 bpm. Exam Location:  Inpatient  Procedure: 2D Echo, Cardiac Doppler and Color Doppler  Indications:    I47.1 SVT  History:        Patient has prior history of Echocardiogram examinations. Risk Factors:Hypertension, Diabetes and Dyslipidemia.  Sonographer:    Mike Gip Referring Phys: [id number] ANAND D HONGALGI  IMPRESSIONS   1. Left ventricular ejection fraction, by estimation, is 55 to 60%. Left ventricular ejection fraction by 2D MOD biplane is 56.7 %. The left ventricle has normal function. The left ventricle has no regional wall motion abnormalities. Left ventricular diastolic parameters are consistent with Grade I diastolic dysfunction (impaired relaxation). 2. Right ventricular systolic function is normal. The right ventricular size  is normal. There is normal pulmonary artery systolic pressure. The estimated right ventricular systolic pressure is 19.0 mmHg. 3. The mitral valve is grossly normal. Trivial mitral valve regurgitation. No evidence of mitral stenosis. 4. The aortic valve is tricuspid. Aortic valve regurgitation is not visualized. No aortic stenosis is present. 5. The inferior vena cava is normal in size with greater than 50% respiratory variability, suggesting right atrial pressure of 3 mmHg.  FINDINGS Left Ventricle: Left ventricular ejection fraction, by estimation, is 55 to 60%. Left ventricular ejection fraction by 2D MOD biplane is 56.7 %. The left ventricle has normal function. The left ventricle has no regional wall motion  abnormalities. The left ventricular internal cavity size was normal in size. There is no left ventricular hypertrophy. Left ventricular diastolic parameters are consistent with Grade I diastolic dysfunction (impaired relaxation).  Right Ventricle: The right ventricular size is normal. No increase in right ventricular wall thickness. Right ventricular systolic function is normal. There is normal pulmonary artery systolic pressure. The tricuspid regurgitant velocity is 2.00 m/s, and with an assumed right atrial pressure of 3 mmHg, the estimated right ventricular systolic pressure is 19.0 mmHg.  Left Atrium: Left atrial size was normal in size.  Right Atrium: Right atrial size was normal in size.  Pericardium: There is no evidence of pericardial effusion.  Mitral Valve: The mitral valve is grossly normal. Trivial mitral valve regurgitation. No evidence of mitral valve stenosis.  Tricuspid Valve: The tricuspid valve is grossly normal. Tricuspid valve regurgitation is trivial. No evidence of tricuspid stenosis.  Aortic Valve: The aortic valve is tricuspid. Aortic valve regurgitation is not visualized. No aortic stenosis is present.  Pulmonic Valve: The pulmonic valve was grossly normal. Pulmonic valve regurgitation is not visualized. No evidence of pulmonic stenosis.  Aorta: The aortic root is normal in size and structure.  Venous: The inferior vena cava is normal in size with greater than 50% respiratory variability, suggesting right atrial pressure of 3 mmHg.  IAS/Shunts: The atrial septum is grossly normal.   LEFT VENTRICLE PLAX 2D                        Biplane EF (MOD) LVIDd:         4.30 cm         LV Biplane EF:   Left LVIDs:         3.10 cm                          ventricular LV PW:         0.90 cm                          ejection LV IVS:        0.90 cm                          fraction by LVOT diam:     2.00 cm                          2D MOD LV SV:         46                                biplane is LV SV Index:  23                               56.7 %. LVOT Area:     3.14 cm Diastology LV e' medial:    7.07 cm/s LV Volumes (MOD)               LV E/e' medial:  4.8 LV vol d, MOD    104.0 ml      LV e' lateral:   9.46 cm/s A2C:                           LV E/e' lateral: 3.6 LV vol d, MOD    122.0 ml A4C: LV vol s, MOD    45.1 ml A2C: LV vol s, MOD    52.3 ml A4C: LV SV MOD A2C:   58.9 ml LV SV MOD A4C:   122.0 ml LV SV MOD BP:    64.9 ml  RIGHT VENTRICLE             IVC RV Basal diam:  3.80 cm     IVC diam: 1.60 cm RV S prime:     11.50 cm/s TAPSE (M-mode): 1.9 cm  LEFT ATRIUM             Index        RIGHT ATRIUM           Index LA diam:        3.30 cm 1.67 cm/m   RA Area:     16.70 cm LA Vol (A2C):   35.7 ml 18.11 ml/m  RA Volume:   43.80 ml  22.22 ml/m LA Vol (A4C):   36.8 ml 18.67 ml/m LA Biplane Vol: 38.8 ml 19.68 ml/m AORTIC VALVE LVOT Vmax:   85.00 cm/s LVOT Vmean:  53.100 cm/s LVOT VTI:    0.147 m  AORTA Ao Root diam: 3.10 cm  MITRAL VALVE               TRICUSPID VALVE MV Area (PHT): 2.11 cm    TR Peak grad:   16.0 mmHg MV Decel Time: 360 msec    TR Vmax:        200.00 cm/s MV E velocity: 33.80 cm/s MV A velocity: 69.40 cm/s  SHUNTS MV E/A ratio:  0.49        Systemic VTI:  0.15 m Systemic Diam: 2.00 cm  Lennie Odor MD Electronically signed by Lennie Odor MD Signature Date/Time: 06/22/2022/2:01:42 PM    Final    MONITORS  LONG TERM MONITOR (3-14 DAYS) 10/06/2022  Narrative 13 Day Zio Monitor  Quality: Fair.  Baseline artifact. Predominant rhythm: sinus rhythm Average heart rate: 69 bpm Max heart rate: 121 bpm Min heart rate: 48 bpm Pauses >2.5 seconds: none  Rare PACs and PVCs (<1%) 2 episodes of SVT lasting up to 21.9 seconds   Tiffany C. Duke Salvia, MD, [facility] 11/07/2022 9:27 AM           Risk Assessment/Calculations:     HYPERTENSION CONTROL Vitals:   12/20/22 1532 12/20/22 1545  BP: (!) 160/82  (!) 180/84    The patient's blood pressure is elevated above target today.  In order to address the patient's elevated BP: Follow up with general cardiology has been recommended.; Labs and/or other diagnostics are currently pending prior to making blood pressure medication adjustments.  Physical Exam:   VS:  BP (!) 180/84   Pulse 62   Ht 5\' 10"  (1.778 m)   Wt 162 lb (73.5 kg)   SpO2 98%   BMI 23.24 kg/m    Wt Readings from Last 3 Encounters:  12/20/22 162 lb (73.5 kg)  11/19/22 155 lb (70.3 kg)  10/07/22 154 lb 9.6 oz (70.1 kg)    GEN: Well nourished, well developed in no acute distress NECK: No JVD; No carotid bruits CARDIAC: RRR, no murmurs, rubs, gallops RESPIRATORY:  Clear to auscultation without rales, wheezing or rhonchi  ABDOMEN: Soft, non-tender, non-distended EXTREMITIES:  No edema; No deformity   ASSESSMENT AND PLAN: .    SVT / On Amiodarone therapy- Diagnosed during 06/2022 admission.  Echo normal LVEF, no significant valvular abnormalities.  Continue Amiodarone 200mg  every day, Toprol 100mg  every day. Monitor 08/2022 with two brief <20 second episodes of SVT which were asymptomatic. No recent palpitations.  Amiodarone monitoring: 08/2022 TSH, CMP unremarkable.   Aortic atherosclerosis/ HLD, LDL goal <70 / Hypertriglyceridemia- Stable with no anginal symptoms. No indication for ischemic evaluation.  Has started Leqvio. Vascepa cost prohibitive. Update CMP, lipid panel within the next few weeks (defer today as not fasting) and readdress lipid management based on results.    DM2 -06/2022 A1c 11.6. 09/2022 A1c 11.4. Continue to follow with PCP.  Recommend low carbohydrate diet and increasing physical activity.   HTN - BP not at goal <130/80 in clinic. Most recent PCP visit 10/26/22 126/80 and reports home readings often 120s/80s.  Lisinopril and spironolactone held 06/2022 admission due to AKI with subsequent normalization of renal function.  Labetalol was transitioned  to Toprol for control of SVT during 06/2022 admission.  Presently on hydralazine 100 mg 3 times daily. Spironolactone resumed at last visit, update BMP and if stable renal function/potassium consider increaseing Spironolactone and holding potassium supplement. Wife notes some indiscretion with sodium, education provided.  2014 renal duplex with no stenosis. CT 06/2022 normal adrenal glands. Hyperaldosteronism, as below.   Hyperaldosteronism / Hypokalemia- prior evaluation at [facility] in 2009 with aldosterone 18.9.  He was started on spironolactone with subsequent normalization of aldosterone and improvement in BP control.  CT 06/2022 normal adrenal glands.  Update BMP today and consider increasing Spironolactone, as above.   Preop - Pending removal of kidney tumor. Per AHA/ACC guidelines, he is deemed acceptable risk for the planned procedure without additional cardiovascular testing.       Dispo: follow up in 2-3 months  Signed, Alver Sorrow, NP

## 2022-12-20 NOTE — Patient Instructions (Addendum)
Medication Instructions:  Continue your current medications.   *If you need a refill on your cardiac medications before your next appointment, please call your pharmacy*   Lab Work: Your physician recommends that you return for lab work today: [facility]  Your physician recommends that you return for lab work in 3-4 weeks for fasting lipid panel and CMP.  Please return for Lab work. You may come to the...   [facility] (3rd floor) [address] [place], [place] [address]  Open: 8am-Noon and 1pm-4:30pm  Please ring the doorbell on the small table when you exit the elevator and the Lab Tech will come get you  [facility] at [facility] [address], [place], [place] [address] Open: 8am-1pm, then 2pm-4:30pm   [facility]- Please see attached locations sheet stapled to your lab work with address and hours.    If you have labs (blood work) drawn today and your tests are completely normal, you will receive your results only by: MyChart Message (if you have MyChart) OR A paper copy in the mail If you have any lab test that is abnormal or we need to change your treatment, we will call you to review the results.  Follow-Up: At [facility], you and your health needs are our priority.  As part of our continuing mission to provide you with exceptional heart care, we have created designated Provider Care Teams.  These Care Teams include your primary Cardiologist (physician) and Advanced Practice Providers (APPs -  Physician Assistants and Nurse Practitioners) who all work together to provide you with the care you need, when you need it.  We recommend signing up for the patient portal called "MyChart".  Sign up information is provided on this After Visit Summary.  MyChart is used to connect with patients for Virtual Visits (Telemedicine).  Patients are able to view lab/test results, encounter notes, upcoming appointments, etc.  Non-urgent messages  can be sent to your provider as well.   To learn more about what you can do with MyChart, go to [email or web address].    Your next appointment:   2-3 month(s)  Provider:   Chilton Si, MD or Gillian Shields, NP    Other Instructions  Tips to Measure your Blood Pressure Correctly  To determine whether you have hypertension, a medical professional will take a blood pressure reading. How you prepare for the test, the position of your arm, and other factors can change a blood pressure reading by 10% or more. That could be enough to hide high blood pressure, start you on a drug you don't really need, or lead your doctor to incorrectly adjust your medications.  National and international guidelines offer specific instructions for measuring blood pressure. If a doctor, nurse, or medical assistant isn't doing it right, don't hesitate to ask him or her to get with the guidelines.  Here's what you can do to ensure a correct reading:  Don't drink a caffeinated beverage or smoke during the 30 minutes before the test.  Sit quietly for five minutes before the test begins.  During the measurement, sit in a chair with your feet on the floor and your arm supported so your elbow is at about heart level.  The inflatable part of the cuff should completely cover at least 80% of your upper arm, and the cuff should be placed on bare skin, not over a shirt.  Don't talk during the measurement.  Have your blood pressure measured twice, with a brief  break in between. If the readings are different by 5 points or more, have it done a third time.  In 2017, new guidelines from the American Heart Association, the [employer] of Cardiology, and nine other health organizations lowered the diagnosis of high blood pressure to 130/80 mm Hg or higher for all adults. The guidelines also redefined the various blood pressure categories to now include normal, elevated, Stage 1 hypertension, Stage 2 hypertension, and  hypertensive crisis (see "Blood pressure categories").  Blood pressure categories  Blood pressure category SYSTOLIC (upper number)  DIASTOLIC (lower number)  Normal Less than 120 mm Hg and Less than 80 mm Hg  Elevated 120-129 mm Hg and Less than 80 mm Hg  High blood pressure: Stage 1 hypertension 130-139 mm Hg or 80-89 mm Hg  High blood pressure: Stage 2 hypertension 140 mm Hg or higher or 90 mm Hg or higher  Hypertensive crisis (consult your doctor immediately) Higher than 180 mm Hg and/or Higher than 120 mm Hg  Source: American Heart Association and American Stroke Association. For more on getting your blood pressure under control, buy Controlling Your Blood Pressure, a Special Health Report from [facility].   Blood Pressure Log   Date   Time  Blood Pressure  Position  Example: Nov 1 9 AM 124/78 sitting

## 2022-12-21 ENCOUNTER — Telehealth (HOSPITAL_BASED_OUTPATIENT_CLINIC_OR_DEPARTMENT_OTHER): Payer: Self-pay

## 2022-12-21 LAB — BASIC METABOLIC PANEL
BUN/Creatinine Ratio: 30 — ABNORMAL HIGH (ref 10–24)
BUN: 33 mg/dL — ABNORMAL HIGH (ref 8–27)
CO2: 18 mmol/L — ABNORMAL LOW (ref 20–29)
Calcium: 10.2 mg/dL (ref 8.6–10.2)
Chloride: 110 mmol/L — ABNORMAL HIGH (ref 96–106)
Creatinine, Ser: 1.1 mg/dL (ref 0.76–1.27)
Glucose: 150 mg/dL — ABNORMAL HIGH (ref 70–99)
Potassium: 4.1 mmol/L (ref 3.5–5.2)
Sodium: 144 mmol/L (ref 134–144)
eGFR: 73 mL/min/{1.73_m2} (ref >59)

## 2022-12-21 MED ORDER — SPIRONOLACTONE 50 MG PO TABS: 50.0000 mg | ORAL_TABLET | Freq: Every day | ORAL | Status: DC

## 2022-12-21 NOTE — Telephone Encounter (Addendum)
Results called to patient who verbalizes understanding! Patient will increase medication and send log in one week, will send new rx at that time if BP stable.    ----- Message from Alver Sorrow sent at 12/21/2022  7:58 AM EST ----- Stable kidney function. Normal potassium. Increase Spironolactone to 50mg  daily. Check BP twice per day and keep a log and report back in 1 week.

## 2022-12-28 ENCOUNTER — Encounter (HOSPITAL_BASED_OUTPATIENT_CLINIC_OR_DEPARTMENT_OTHER): Payer: Self-pay

## 2023-01-03 LAB — COMPREHENSIVE METABOLIC PANEL
ALT: 20 [IU]/L (ref 0–44)
AST: 19 [IU]/L (ref 0–40)
Albumin: 4.8 g/dL (ref 3.9–4.9)
Alkaline Phosphatase: 105 [IU]/L (ref 44–121)
BUN/Creatinine Ratio: 22 (ref 10–24)
BUN: 24 mg/dL (ref 8–27)
Bilirubin Total: 0.3 mg/dL (ref 0.0–1.2)
CO2: 18 mmol/L — ABNORMAL LOW (ref 20–29)
Calcium: 10 mg/dL (ref 8.6–10.2)
Chloride: 107 mmol/L — ABNORMAL HIGH (ref 96–106)
Creatinine, Ser: 1.11 mg/dL (ref 0.76–1.27)
Globulin, Total: 2.3 g/dL (ref 1.5–4.5)
Glucose: 140 mg/dL — ABNORMAL HIGH (ref 70–99)
Potassium: 4.3 mmol/L (ref 3.5–5.2)
Sodium: 143 mmol/L (ref 134–144)
Total Protein: 7.1 g/dL (ref 6.0–8.5)
eGFR: 72 mL/min/{1.73_m2} (ref >59)

## 2023-01-03 LAB — LIPID PANEL
Chol/HDL Ratio: 2.7 ratio (ref 0.0–5.0)
Cholesterol, Total: 106 mg/dL (ref 100–199)
HDL: 40 mg/dL (ref >39)
LDL Chol Calc (NIH): 26 mg/dL (ref 0–99)
Triglycerides: 267 mg/dL — ABNORMAL HIGH (ref 0–149)
VLDL Cholesterol Cal: 40 mg/dL (ref 5–40)

## 2023-01-04 ENCOUNTER — Telehealth (HOSPITAL_BASED_OUTPATIENT_CLINIC_OR_DEPARTMENT_OTHER): Payer: Self-pay

## 2023-01-04 DIAGNOSIS — I10 Essential (primary) hypertension: Secondary | ICD-10-CM

## 2023-01-04 DIAGNOSIS — E785 Hyperlipidemia, unspecified: Secondary | ICD-10-CM

## 2023-01-04 MED ORDER — OLMESARTAN MEDOXOMIL 20 MG PO TABS: 20.0000 mg | ORAL_TABLET | Freq: Every day | ORAL | 2 refills | Status: DC

## 2023-01-04 NOTE — Telephone Encounter (Signed)
-----   Message from Alver Sorrow sent at 01/04/2023  7:51 AM EST ----- Normal kidneys, liver. LDL (bad cholesterol) of 26 which is at goal of <70. Triglycerides have decreased from 454 to 267 which is great! Continue Leqvio and Vascepa, regular exercise, and following a triglyceride lowering diet (choosing whole grains, limiting simple sugars and alcohol).  As BP elevated  by recent MyChart message recommend starting Olmesartan 20mg  daily. Repeat BMP in 1-2 weeks.

## 2023-01-12 ENCOUNTER — Other Ambulatory Visit: Payer: Self-pay

## 2023-01-25 LAB — BASIC METABOLIC PANEL
BUN/Creatinine Ratio: 20 (ref 10–24)
BUN: 30 mg/dL — ABNORMAL HIGH (ref 8–27)
CO2: 17 mmol/L — ABNORMAL LOW (ref 20–29)
Calcium: 10.3 mg/dL — ABNORMAL HIGH (ref 8.6–10.2)
Chloride: 105 mmol/L (ref 96–106)
Creatinine, Ser: 1.51 mg/dL — ABNORMAL HIGH (ref 0.76–1.27)
Glucose: 286 mg/dL — ABNORMAL HIGH (ref 70–99)
Potassium: 4.7 mmol/L (ref 3.5–5.2)
Sodium: 140 mmol/L (ref 134–144)
eGFR: 50 mL/min/{1.73_m2} — ABNORMAL LOW (ref >59)

## 2023-01-26 ENCOUNTER — Telehealth (HOSPITAL_BASED_OUTPATIENT_CLINIC_OR_DEPARTMENT_OTHER): Payer: Self-pay

## 2023-01-26 DIAGNOSIS — I10 Essential (primary) hypertension: Secondary | ICD-10-CM

## 2023-01-26 NOTE — Telephone Encounter (Signed)
Called patient and discussed lab result recommendations. He verbalized understanding. Will mail lab orders to patient.

## 2023-01-26 NOTE — Telephone Encounter (Signed)
-----   Message from Alver Sorrow sent at 01/25/2023  2:21 PM EST ----- Slight increase in creatinine with addition of Olmesartan which is expected. Recommend continue Olmesartan 20mg  daily and repeat BMP in 2 weeks for monitoring.

## 2023-01-28 ENCOUNTER — Ambulatory Visit: Payer: Medicare Other | Admitting: Neurology

## 2023-01-28 ENCOUNTER — Other Ambulatory Visit: Payer: Self-pay | Admitting: Urology

## 2023-01-28 DIAGNOSIS — N2889 Other specified disorders of kidney and ureter: Secondary | ICD-10-CM

## 2023-02-01 ENCOUNTER — Ambulatory Visit: Payer: Medicare Other | Admitting: Neurology

## 2023-02-11 ENCOUNTER — Ambulatory Visit
Admission: RE | Admit: 2023-02-11 | Discharge: 2023-02-11 | Disposition: A | Discharge status: Home / Self Care | Payer: Medicare Other | Source: Ambulatory Visit | Attending: Urology

## 2023-02-11 DIAGNOSIS — N2889 Other specified disorders of kidney and ureter: Secondary | ICD-10-CM

## 2023-02-11 HISTORY — PX: IR RADIOLOGIST EVAL & MGMT: IMG5224

## 2023-02-11 NOTE — Progress Notes (Incomplete)
Chief Complaint: Patient was seen in consultation today for No chief complaint on file.  at the request of Winter,Christopher Clifton Custard  Referring Physician(s): Winter,Christopher Clifton Custard  History of Present Illness: Kyle [name] is a 67 y.o. male ***  Past Medical History:  Diagnosis Date   Cataracts, bilateral    Diabetes mellitus without complication (HCC)    DM (diabetes mellitus) (HCC)    History of basal cell carcinoma of skin    shoulder   HLD (hyperlipidemia)    Hypertension    Migraines    PAD (peripheral artery disease) (HCC)    Prediabetes     Past Surgical History:  Procedure Laterality Date   IR RADIOLOGIST EVAL & MGMT  02/11/2023   SPINE SURGERY  1994   TONSILLECTOMY  1961    Allergies: Statins and Gabapentin  Medications: Prior to Admission medications   Medication Sig Start Date End Date Taking? Authorizing Provider  amiodarone (PACERONE) 200 MG tablet Take 1 tablet (200 mg total) by mouth daily. 09/13/22   Alver Sorrow, NP  clonazePAM (KLONOPIN) 0.25 MG disintegrating tablet Take 1 tablet (0.25 mg total) by mouth at bedtime. 10/07/22 11/06/22  Van Clines, MD  gemfibrozil (LOPID) 600 MG tablet Take 1 tablet (600 mg total) by mouth 2 (two) times daily before a meal. 06/07/17   Jeffery, Chelle, PA  glucose blood test strip Use as instructed Patient taking differently: 1 each by Other route See admin instructions. Use as instructed 12/30/15   Porfirio Oar, PA  hydrALAZINE (APRESOLINE) 100 MG tablet Take 100 mg by mouth 3 (three) times daily.    [provider]  insulin aspart (NOVOLOG) 100 UNIT/ML injection Inject 4 Units into the skin 3 (three) times daily with meals. Patient taking differently: Inject 6 Units into the skin 3 (three) times daily with meals. 07/09/22   Dorcas Carrow, MD  Insulin Glargine (BASAGLAR KWIKPEN [place]) Inject 18 Units into the skin once.    [provider]  Lacosamide 100 MG TABS Take 1 tablet (100  mg total) by mouth 2 (two) times daily. 10/07/22   Van Clines, MD  Lancets 30G MISC Use to check home glucose daily and as needed Patient taking differently: 1 each by Other route See admin instructions. Use to check home glucose daily and as needed for blood sugar 12/30/15   Porfirio Oar, PA  metFORMIN (GLUCOPHAGE) 500 MG tablet Take 2 tablets (1,000 mg total) by mouth 2 (two) times daily with a meal. 12/07/16   Porfirio Oar, PA  metoprolol succinate (TOPROL-XL) 100 MG 24 hr tablet Take 1 tablet (100 mg total) by mouth daily. Take with or immediately following a meal. 09/13/22 10/13/22  Alver Sorrow, NP  olmesartan (BENICAR) 20 MG tablet Take 1 tablet (20 mg total) by mouth daily. 01/04/23   Alver Sorrow, NP  potassium chloride SA (KLOR-CON M) 20 MEQ tablet Take 20 mEq by mouth daily.    [provider]  spironolactone (ALDACTONE) 50 MG tablet Take 1 tablet (50 mg total) by mouth daily. 12/21/22 03/21/23  Alver Sorrow, NP  topiramate (TOPAMAX) 100 MG tablet Take 1 and 1/2 tablets twice a day 10/07/22   Van Clines, MD  VASCEPA 1 g capsule Take 2 capsules (2 g total) by mouth 2 (two) times daily. Patient not taking: Reported on 12/20/2022 07/30/22   Chilton Si, MD     Family History  Problem Relation Age of Onset   Diabetes Father  Hypertension Father    Hypertension Mother    Hypertension Sister    Diabetes Sister        diet controlled    Social History   Socioeconomic History   Marital status: Married    Spouse name: Dennie Bible   Number of children: 2   Years of education: GED   Highest education level: Not on file  Occupational History   Occupation: retired 07/2015    Comment: former truck Hospital doctor, Museum/gallery exhibitions officer   Occupation: drives cars    Comment: [employer], Wednesdays   Occupation: Parts Delivery    Comment: [employer], 3 days/week  Tobacco Use   Smoking status: Former    Types: Cigarettes    Start date: 11/05/2007    Smokeless tobacco: Never  Vaping Use   Vaping status: Never Used  Substance and Sexual Activity   Alcohol use: No    Alcohol/week: 0.0 standard drinks of alcohol   Drug use: No   Sexual activity: Yes    Partners: Female    Birth control/protection: None  Other Topics Concern   Not on file  Social History Narrative   Lives with his wife and their pets.   Daughters live in [place] and [place].   Son lives in [place].   Enjoying doing work around the house and yard.   Is thinking about part time work at some time.   Right handed   Social Drivers of Health   Financial Resource Strain: Low Risk  (05/25/2022)   Received from [facility], [facility]   Overall Financial Resource Strain (CARDIA)    Difficulty of Paying Living Expenses: Not very hard  Food Insecurity: No Food Insecurity (06/21/2022)   Hunger Vital Sign    Worried About Running Out of Food in the Last Year: Never true    Ran Out of Food in the Last Year: Never true  Transportation Needs: No Transportation Needs (06/21/2022)   PRAPARE - Administrator, Civil Service (Medical): No    Lack of Transportation (Non-Medical): No  Physical Activity: Insufficiently Active (05/25/2022)   Received from [facility], [facility]   Exercise Vital Sign    Days of Exercise per Week: 4 days    Minutes of Exercise per Session: 20 min  Stress: No Stress Concern Present (05/25/2022)   Received from [facility], [facility] of Occupational Health - Occupational Stress Questionnaire    Feeling of Stress : Not at all  Social Connections: Socially Integrated (05/25/2022)   Received from [facility], [facility]   Social Network    How would you rate your social network (family, work, friends)?: Good participation with social networks    ECOG Status: {CHL ONC ECOG ZO:1096045409}  Review of Systems: A 12 point ROS discussed and pertinent positives are indicated in the HPI above.  All other systems  are negative.  Review of Systems  Vital Signs: BP (!) 169/70   Pulse 61   Temp 98.2 F (36.8 C)   Resp 20   SpO2 96%   Physical Exam  General: WN, NAD  CV: RRR on monitor Pulm: normal work of breathing on RA Abd: S, ND, NT MSK: Grossly normal Psych: Appropriate affect.     Imaging:    MR Abd W WO, 12/11/22 IMPRESSION:  1. Heterogeneously enhancing mass of the medial superior pole of the right kidney measuring 2.4 x 2.3 cm, consistent with renal cell carcinoma.  2.  Benign hemorrhagic or proteinaceous cyst of the left kidney, requiring no specific further follow-up or characterization.  3. No evidence of renal vein invasion, lymphadenopathy, or metastatic disease in the abdomen.   Labs:  CBC: Recent Labs    07/02/22 0356 07/05/22 0341 07/07/22 0135 10/01/22 1631 10/01/22 1640  WBC 7.9 7.9 9.4 9.1  --   HGB 11.8* 13.2 12.3* 14.1 14.3  HCT 33.7* 38.3* 34.9* 42.3 42.0  PLT 278 347 327 299  --     COAGS: Recent Labs    05/19/22 1509  INR 1.0  APTT 29    BMP: Recent Labs    07/05/22 0341 07/06/22 0212 07/07/22 0135 09/13/22 1605 10/01/22 1631 10/01/22 1640 12/20/22 1632 01/03/23 0805 01/24/23 0844  NA 136 138 138   < > 135 138 144 143 140  K 3.4* 4.1 3.2*   < > 3.8 3.9 4.1 4.3 4.7  CL 109 114* 110   < > 104 109 110* 107* 105  CO2 17* 15* 18*   < > 18*  --  18* 18* 17*  GLUCOSE 143* 125* 166*   < > 346* 348* 150* 140* 286*  BUN 16 20 22    < > 23 24* 33* 24 30*  CALCIUM 9.5 9.8 9.7   < > 9.5  --  10.2 10.0 10.3*  CREATININE 1.02 1.08 1.10   < > 0.95 0.90 1.10 1.11 1.51*  GFRNONAA >60 >60 >60  --  >60  --   --   --   --    < > = values in this interval not displayed.    LIVER FUNCTION TESTS: Recent Labs    07/07/22 0135 09/13/22 1605 10/01/22 1631 01/03/23 0805  BILITOT 0.6 0.2 0.3 0.3  AST 26 11 15 19   ALT 36 15 18 20   ALKPHOS 88 146* 96 105  PROT 6.4* 7.0 7.0 7.1  ALBUMIN 3.4* 4.7 3.7 4.8    TUMOR MARKERS: No results for  input(s): "AFPTM", "CEA", "CA199", "CHROMGRNA" in the last 8760 hours.  Assessment and Plan:  ***  Thank you for this interesting consult.  I greatly enjoyed meeting Mekhi Rayer and look forward to participating in their care.  A copy of this report was sent to the requesting provider on this date.  Electronically Signed:  Roanna Banning, MD [facility] Rad[facility]   Pager. [phone] Clinic. [phone]  I spent a total of {New INPT:304952001} {New Out-Pt:304952002}  {Established Out-Pt:304952003} in face to face in clinical consultation, greater than 50% of which was counseling/coordinating care for ***

## 2023-02-15 ENCOUNTER — Telehealth: Payer: Self-pay | Admitting: Cardiovascular Disease

## 2023-02-15 NOTE — Telephone Encounter (Signed)
   Name: Kyle [name]  DOB: 1955/12/07  MRN: [id number]  Primary Cardiologist: Annabella Scarce, MD  Chart reviewed as part of pre-operative protocol coverage. Because of Kyle [name] past medical history and time since last visit, he will require a follow-up in-office visit in order to better assess preoperative cardiovascular risk.  Pre-op covering staff: - Please schedule appointment and call patient to inform them. If patient already had an upcoming appointment within acceptable timeframe, please add pre-op clearance to the appointment notes so provider is aware. - Please contact requesting surgeon's office via preferred method (i.e, phone, fax) to inform them of need for appointment prior to surgery.  No medications indicated as needing held.   Kyle LOISE Fabry, PA-C  02/15/2023, 3:57 PM

## 2023-02-15 NOTE — Telephone Encounter (Signed)
Will update all parties involved pt has appt 03/07/23 with Gillian Shields, NP.

## 2023-02-15 NOTE — Telephone Encounter (Signed)
   Pre-operative Risk Assessment    Patient Name: Kyle [name]  DOB: [birth year] MRN: [id number]   Date of last office visit: 12/20/22 Date of next office visit: 03/07/23    Request for Surgical Clearance    Procedure:   Renal cryoablation   Date of Surgery:  Clearance TBD                                Surgeon:  Dr. Hughes  Surgeon's Group or Practice Name:  [facility]  Phone number:  [phone] Fax number:  [phone]    Type of Clearance Requested:   - Medical    Type of Anesthesia:  General    Additional requests/questions:    Bonney Sheffield JONELLE Lenora   02/15/2023, 2:42 PM

## 2023-02-15 NOTE — Telephone Encounter (Signed)
 Last seen 12/20/22 noted to be pending removal of kidney tumor. Clearance was granted at that time.   As >30 days from clinic visit, called and LVM requesting call back. If still with exercise tolerance >4METS and no concerning features, could proceed with surgery prior to follow up visit. Await call back.  Kyle [name] S Jaaron Oleson, NP

## 2023-02-17 NOTE — Telephone Encounter (Signed)
    Name: Kyle [name]  DOB: [birth year]  MRN: [id number]   Primary Cardiologist: Annabella Scarce, MD  Chart reviewed as part of pre-operative protocol coverage. Patient was contacted 02/17/2023 in reference to pre-operative risk assessment for pending surgery as outlined below.  Kyle [name] was last seen on 12/20/22 by Reche GORMAN Finder, NP.  Since that day, Kyle [name] has done well. BP at home BP 120s/60-70s. Reports no chest pain, exertional dyspnea. Exercise tolerance  >4 METS.   Therefore, based on ACC/AHA guidelines, the patient would be at acceptable risk for the planned procedure without further cardiovascular testing.   The patient was advised that if he develops new symptoms prior to surgery to contact our office to arrange for a follow-up visit, and he verbalized understanding.  I will route this recommendation to the requesting party via Epic fax function and remove from pre-op pool. Please call with questions.  Reche GORMAN Finder, NP 02/17/2023, 10:48 AM   Reche GORMAN Finder, NP

## 2023-02-17 NOTE — Telephone Encounter (Signed)
 Called, no answer. Did not leave VM as already done previously.   Alver Sorrow, NP

## 2023-02-21 ENCOUNTER — Other Ambulatory Visit (HOSPITAL_COMMUNITY): Payer: Self-pay | Admitting: Interventional Radiology

## 2023-02-21 DIAGNOSIS — N2889 Other specified disorders of kidney and ureter: Secondary | ICD-10-CM

## 2023-02-24 ENCOUNTER — Telehealth: Payer: Self-pay

## 2023-02-24 NOTE — Telephone Encounter (Signed)
 Auth Submission: NO AUTH NEEDED Site of care: Site of care: [facility] Payer: Medicare & [employer] Supplement Medication & CPT/J Code(s) submitted: Leqvio  (Inclisiran) [id number]   Auth type: Buy/Bill Units/visits requested: 284mg  x 2 doses   Approval from: 08/04/2022 to 03/17/24

## 2023-03-01 LAB — COLOGUARD: COLOGUARD: NEGATIVE

## 2023-03-01 LAB — EXTERNAL GENERIC LAB PROCEDURE: COLOGUARD: NEGATIVE

## 2023-03-06 NOTE — Progress Notes (Unsigned)
Cardiology Office Note:  .   Date:  03/07/2023  ID:  Kyle [name], DOB [birth year], MRN [id number] PCP: Porfirio Oar, PA  [facility] Providers Cardiologist:  Chilton Si, MD    History of Present Illness: .   Kyle [name] is a 68 y.o. male with history of DM 2, HTN, HLD, PAD, SVT, seizure, hyperaldosteronism.   ED visit 05/18/2022 with visual disturbance x 2 days with CT head, MRI brain, neurological test unremarkable.  Presented 06/20/2022 with palpitations, lightheadedness, dizziness, presyncopal episodes.  Found to be in SVT.  He also had focal seizures.  MRI brain 5/14 septal abnormality left occipital lobe.  Continuous EEG demonstrated seizures.  He was started on Toprol, amiodarone for SVT.  Labetalol was discontinued.  Lisinopril, spironolactone held due to AKI which normalized prior to discharge.  Echocardiogram during admission normal LVEF, no significant valvular maladies.  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  His A1c was 11.6 and was discharged on insulin.   He saw pharmacy team 07/23/2022 for lipid management and was started on Vascepa and Leqvio. At visit 09/13/22 kidney function stable and Spironolactone resumed at 25mg  daily.    At last visit 12/2022 Olmesartan was added for BP control.    Presents today for follow-up and surgical clearance. Getting kidney tumor removed 04/13/23.  BP is high today, was on 100mg  of spiro pre hospitalization 06/2022 and is currently on 50 mg daily. This morning was 117/63. Some times 130/60 and in the evenings can be 140-160 systolic. Has had BP cuff checked in primary care for accuracy.  Denies CP, SOB, dizziness, lightheadedness, swelling in LE.  ` ROS: Please see the history of present illness.    All other systems reviewed and are negative.   Studies Reviewed: Marland Kitchen   EKG Interpretation Date/Time:  Monday March 07 2023 08:36:21 EST Ventricular Rate:  56 PR  Interval:  214 QRS Duration:  92 QT Interval:  416 QTC Calculation: 401 R Axis:   69  Text Interpretation: Sinus bradycardia with 1st degree A-V block When compared with ECG of 01-Oct-2022 19:52, PREVIOUS ECG IS PRESENT Confirmed by Gillian Shields ([id number]) on 03/07/2023 9:30:32 AM    Cardiac Studies & Procedures      ECHOCARDIOGRAM  ECHOCARDIOGRAM COMPLETE 06/22/2022  Narrative ECHOCARDIOGRAM REPORT    Patient Name:   Kyle [name] Date of Exam: 06/22/2022 Medical Rec #:  [id number]         Height:       70.0 in Accession #:    [id number]        Weight:       174.8 lb Date of Birth:  [birth year]         BSA:          1.971 m Patient Age:    66 years          BP:           115/75 mmHg Patient Gender: M                 HR:           75 bpm. Exam Location:  Inpatient  Procedure: 2D Echo, Cardiac Doppler and Color Doppler  Indications:    I47.1 SVT  History:        Patient has prior history of Echocardiogram examinations. Risk Factors:Hypertension, Diabetes and Dyslipidemia.  Sonographer:    Mike Gip Referring Phys: [phone] ANAND D HONGALGI  IMPRESSIONS   1.  Left ventricular ejection fraction, by estimation, is 55 to 60%. Left ventricular ejection fraction by 2D MOD biplane is 56.7 %. The left ventricle has normal function. The left ventricle has no regional wall motion abnormalities. Left ventricular diastolic parameters are consistent with Grade I diastolic dysfunction (impaired relaxation). 2. Right ventricular systolic function is normal. The right ventricular size is normal. There is normal pulmonary artery systolic pressure. The estimated right ventricular systolic pressure is 19.0 mmHg. 3. The mitral valve is grossly normal. Trivial mitral valve regurgitation. No evidence of mitral stenosis. 4. The aortic valve is tricuspid. Aortic valve regurgitation is not visualized. No aortic stenosis is present. 5. The inferior vena cava is normal in size with greater than 50%  respiratory variability, suggesting right atrial pressure of 3 mmHg.  FINDINGS Left Ventricle: Left ventricular ejection fraction, by estimation, is 55 to 60%. Left ventricular ejection fraction by 2D MOD biplane is 56.7 %. The left ventricle has normal function. The left ventricle has no regional wall motion abnormalities. The left ventricular internal cavity size was normal in size. There is no left ventricular hypertrophy. Left ventricular diastolic parameters are consistent with Grade I diastolic dysfunction (impaired relaxation).  Right Ventricle: The right ventricular size is normal. No increase in right ventricular wall thickness. Right ventricular systolic function is normal. There is normal pulmonary artery systolic pressure. The tricuspid regurgitant velocity is 2.00 m/s, and with an assumed right atrial pressure of 3 mmHg, the estimated right ventricular systolic pressure is 19.0 mmHg.  Left Atrium: Left atrial size was normal in size.  Right Atrium: Right atrial size was normal in size.  Pericardium: There is no evidence of pericardial effusion.  Mitral Valve: The mitral valve is grossly normal. Trivial mitral valve regurgitation. No evidence of mitral valve stenosis.  Tricuspid Valve: The tricuspid valve is grossly normal. Tricuspid valve regurgitation is trivial. No evidence of tricuspid stenosis.  Aortic Valve: The aortic valve is tricuspid. Aortic valve regurgitation is not visualized. No aortic stenosis is present.  Pulmonic Valve: The pulmonic valve was grossly normal. Pulmonic valve regurgitation is not visualized. No evidence of pulmonic stenosis.  Aorta: The aortic root is normal in size and structure.  Venous: The inferior vena cava is normal in size with greater than 50% respiratory variability, suggesting right atrial pressure of 3 mmHg.  IAS/Shunts: The atrial septum is grossly normal.   LEFT VENTRICLE PLAX 2D                        Biplane EF (MOD) LVIDd:          4.30 cm         LV Biplane EF:   Left LVIDs:         3.10 cm                          ventricular LV PW:         0.90 cm                          ejection LV IVS:        0.90 cm                          fraction by LVOT diam:     2.00 cm  2D MOD LV SV:         46                               biplane is LV SV Index:   23                               56.7 %. LVOT Area:     3.14 cm Diastology LV e' medial:    7.07 cm/s LV Volumes (MOD)               LV E/e' medial:  4.8 LV vol d, MOD    104.0 ml      LV e' lateral:   9.46 cm/s A2C:                           LV E/e' lateral: 3.6 LV vol d, MOD    122.0 ml A4C: LV vol s, MOD    45.1 ml A2C: LV vol s, MOD    52.3 ml A4C: LV SV MOD A2C:   58.9 ml LV SV MOD A4C:   122.0 ml LV SV MOD BP:    64.9 ml  RIGHT VENTRICLE             IVC RV Basal diam:  3.80 cm     IVC diam: 1.60 cm RV S prime:     11.50 cm/s TAPSE (M-mode): 1.9 cm  LEFT ATRIUM             Index        RIGHT ATRIUM           Index LA diam:        3.30 cm 1.67 cm/m   RA Area:     16.70 cm LA Vol (A2C):   35.7 ml 18.11 ml/m  RA Volume:   43.80 ml  22.22 ml/m LA Vol (A4C):   36.8 ml 18.67 ml/m LA Biplane Vol: 38.8 ml 19.68 ml/m AORTIC VALVE LVOT Vmax:   85.00 cm/s LVOT Vmean:  53.100 cm/s LVOT VTI:    0.147 m  AORTA Ao Root diam: 3.10 cm  MITRAL VALVE               TRICUSPID VALVE MV Area (PHT): 2.11 cm    TR Peak grad:   16.0 mmHg MV Decel Time: 360 msec    TR Vmax:        200.00 cm/s MV E velocity: 33.80 cm/s MV A velocity: 69.40 cm/s  SHUNTS MV E/A ratio:  0.49        Systemic VTI:  0.15 m Systemic Diam: 2.00 cm  Lennie Odor MD Electronically signed by Lennie Odor MD Signature Date/Time: 06/22/2022/2:01:42 PM    Final   MONITORS  LONG TERM MONITOR (3-14 DAYS) 10/06/2022  Narrative 13 Day Zio Monitor  Quality: Fair.  Baseline artifact. Predominant rhythm: sinus rhythm Average heart rate: 69 bpm Max heart rate: 121  bpm Min heart rate: 48 bpm Pauses >2.5 seconds: none  Rare PACs and PVCs (<1%) 2 episodes of SVT lasting up to 21.9 seconds   Tiffany C. Duke Salvia, MD, [facility] 11/07/2022 9:27 AM             Risk Assessment/Calculations:     HYPERTENSION CONTROL Vitals:   03/07/23 0841 03/07/23 1012  BP: (!) 156/70 Marland Kitchen)  180/88    The patient's blood pressure is elevated above target today.  In order to address the patient's elevated BP: Labs and/or other diagnostics are currently pending prior to making blood pressure medication adjustments.; Follow up with general cardiology has been recommended.          Physical Exam:   VS:  BP (!) 180/88   Pulse 61   Ht 5' 10.5" (1.791 m)   Wt 166 lb 14.4 oz (75.7 kg)   SpO2 97%   BMI 23.61 kg/m    Wt Readings from Last 3 Encounters:  03/07/23 166 lb 14.4 oz (75.7 kg)  12/20/22 162 lb (73.5 kg)  11/19/22 155 lb (70.3 kg)    GEN: Well nourished, well developed in no acute distress NECK: No JVD; No carotid bruits CARDIAC: RRR, no murmurs, rubs, gallops RESPIRATORY:  Clear to auscultation without rales, wheezing or rhonchi  ABDOMEN: Soft, non-tender, non-distended EXTREMITIES:  No edema; No deformity   ASSESSMENT AND PLAN: .    SVT / On Amiodarone therapy- Diagnosed during 06/2022 admission.  Echo normal LVEF, no significant valvular abnormalities.  Monitor 08/2022 with two brief <20 second episodes of SVT which were asymptomatic. No recent palpitations.  Amiodarone monitoring: 01/04/23 TSH 1.51, 01/03/23 ALT 20  Aortic atherosclerosis/ HLD, LDL goal <70 / Hypertriglyceridemia- Presently on Leqvio. Vascepa cost prohibitive.  01/03/2023 HDL 40, triglycerides 267, LDL 26, total cholesterol 161.  HTN - Lisinopril and spironolactone held 06/2022 admission due to AKI with subsequent normalization of renal function.  Labetalol was transitioned to Toprol for control of SVT during 06/2022 admission.   Spironolactone 50 mg resumed 08/2022.  Secondary hypertension  workup 2014 renal duplex with no stenosis. CT 06/2022 normal adrenal glands. Hyperaldosteronism, as below. BP labile at home and markedly elevated in clinic. BMP pending. Will increase Spirolactone to 100 mg if  kidney function stable.   Hyperaldosteronism / Hypokalemia- prior evaluation at [facility] in 2009 with aldosterone 18.9.  He was started on spironolactone with subsequent normalization of aldosterone and improvement in BP control.  CT 06/2022 normal adrenal glands. BMP pending. Will increase Spirolactone to 100 mg if  kidney function stable, as above.   Preop - Pending renal cryoablation. According to the Revised Cardiac Risk Index (RCRI), his Perioperative Risk of Major Cardiac Event is (%): 6.6. His Functional Capacity in METs is: 8.33 according to the Duke Activity Status Index (DASI). Per AHA/ACC guidelines, he is deemed acceptable risk for the planned procedure without additional cardiovascular testing. Will route to surgical team so they are aware.         Dispo: follow up in May 2025  Signed, Alver Sorrow, NP

## 2023-03-07 ENCOUNTER — Ambulatory Visit (INDEPENDENT_AMBULATORY_CARE_PROVIDER_SITE_OTHER): Payer: Medicare Other | Admitting: Family

## 2023-03-07 ENCOUNTER — Encounter (HOSPITAL_BASED_OUTPATIENT_CLINIC_OR_DEPARTMENT_OTHER): Payer: Self-pay | Admitting: Family

## 2023-03-07 VITALS — BP 180/88 | HR 61 | Ht 70.5 in | Wt 166.9 lb

## 2023-03-07 DIAGNOSIS — Z79899 Other long term (current) drug therapy: Secondary | ICD-10-CM | POA: Diagnosis not present

## 2023-03-07 DIAGNOSIS — I471 Supraventricular tachycardia, unspecified: Secondary | ICD-10-CM

## 2023-03-07 DIAGNOSIS — Z01810 Encounter for preprocedural cardiovascular examination: Secondary | ICD-10-CM

## 2023-03-07 DIAGNOSIS — I10 Essential (primary) hypertension: Secondary | ICD-10-CM | POA: Diagnosis not present

## 2023-03-07 DIAGNOSIS — E269 Hyperaldosteronism, unspecified: Secondary | ICD-10-CM

## 2023-03-07 DIAGNOSIS — I700 Atherosclerosis of aorta: Secondary | ICD-10-CM

## 2023-03-07 DIAGNOSIS — E785 Hyperlipidemia, unspecified: Secondary | ICD-10-CM

## 2023-03-07 NOTE — Patient Instructions (Signed)
Medication Instructions:  Your physician recommends that you continue on your current medications as directed. Please refer to the Current Medication list given to you today.  *If you need a refill on your cardiac medications before your next appointment, please call your pharmacy*   Follow-Up: At [facility], you and your health needs are our priority.  As part of our continuing mission to provide you with exceptional heart care, we have created designated Provider Care Teams.  These Care Teams include your primary Cardiologist (physician) and Advanced Practice Providers (APPs -  Physician Assistants and Nurse Practitioners) who all work together to provide you with the care you need, when you need it.  We recommend signing up for the patient portal called "MyChart".  Sign up information is provided on this After Visit Summary.  MyChart is used to connect with patients for Virtual Visits (Telemedicine).  Patients are able to view lab/test results, encounter notes, upcoming appointments, etc.  Non-urgent messages can be sent to your provider as well.   To learn more about what you can do with MyChart, go to [email or web address].    Your next appointment:   May 2025  Provider:   Chilton Si, MD or Gillian Shields, NP    Other Instructions    Please bring BP cuff to next office visit to ensure accuracy.

## 2023-03-08 ENCOUNTER — Encounter (HOSPITAL_COMMUNITY): Payer: Self-pay | Admitting: Interventional Radiology

## 2023-03-08 ENCOUNTER — Encounter (HOSPITAL_BASED_OUTPATIENT_CLINIC_OR_DEPARTMENT_OTHER): Payer: Self-pay

## 2023-03-08 LAB — BASIC METABOLIC PANEL
BUN/Creatinine Ratio: 28 — ABNORMAL HIGH (ref 10–24)
BUN: 33 mg/dL — ABNORMAL HIGH (ref 8–27)
CO2: 20 mmol/L (ref 20–29)
Calcium: 10.9 mg/dL — ABNORMAL HIGH (ref 8.6–10.2)
Chloride: 108 mmol/L — ABNORMAL HIGH (ref 96–106)
Creatinine, Ser: 1.2 mg/dL (ref 0.76–1.27)
Glucose: 163 mg/dL — ABNORMAL HIGH (ref 70–99)
Potassium: 4.9 mmol/L (ref 3.5–5.2)
Sodium: 143 mmol/L (ref 134–144)
eGFR: 65 mL/min/{1.73_m2} (ref >59)

## 2023-03-09 ENCOUNTER — Telehealth (HOSPITAL_BASED_OUTPATIENT_CLINIC_OR_DEPARTMENT_OTHER): Payer: Self-pay

## 2023-03-09 DIAGNOSIS — I10 Essential (primary) hypertension: Secondary | ICD-10-CM

## 2023-03-09 MED ORDER — SPIRONOLACTONE 100 MG PO TABS: 100.0000 mg | ORAL_TABLET | Freq: Every day | ORAL | 3 refills | Status: DC

## 2023-03-09 NOTE — Telephone Encounter (Signed)
Called and spoke to pt; discussed results and NP's recommendations. Labs mailed to pt. New prescriptions sent to pharmacy. Potassium supplement d/c. He verbalized understanding.  ----- Message from Alver Sorrow sent at 03/08/2023  7:48 AM EST ----- Kidney function much improved which is good!   Due to persistently labile blood pressure recommend stop potassium supplement and increase spironolactone to 100 mg daily.  Please send in 100 mg tablets.   Repeat BMP in 1-2 weeks for monitoring.

## 2023-03-09 NOTE — Telephone Encounter (Signed)
-----   Message from Alver Sorrow sent at 03/08/2023  7:48 AM EST ----- Kidney function much improved which is good!  Due to persistently labile blood pressure recommend stop potassium supplement and increase spironolactone to 100 mg daily.  Please send in 100 mg tablets.  Repeat BMP in 1-2 weeks for monitoring.

## 2023-03-23 ENCOUNTER — Ambulatory Visit (HOSPITAL_COMMUNITY): Payer: Medicare Other

## 2023-04-01 NOTE — Progress Notes (Signed)
Left voicemail of P.A. Line requesting orders in epic from Careers adviser.

## 2023-04-02 ENCOUNTER — Other Ambulatory Visit: Payer: Self-pay | Admitting: Radiology

## 2023-04-02 DIAGNOSIS — N2889 Other specified disorders of kidney and ureter: Secondary | ICD-10-CM

## 2023-04-02 NOTE — Progress Notes (Addendum)
 Anesthesia Review:  PCP: Enrique Sack  Cardiologist :  Chilton Si LOV [facility]ait[facility] 03/07/23  Chest x-ray : 1v-04/05/23  CT Chest- 11/16/22  EKG : 03/07/2023  Monitor- 11/07/22  Echo : 06/22/22  Stress test: 2014 Cardiac Cath :  Activity level: can do a flight of stiars without difficulty  Sleep Study/ CPAP : none  Fasting Blood Sugar :      / Checks Blood Sugar -- times a day:   Blood Thinner/ Instructions /Last Dose: ASA / Instructions/ Last Dose :    DM- type 2- Dexcom monitor  Hgba1c-  04/05/23-  7.5- rouited to DR Mugweru on 04/06/23  Metformin- none day of [employer]- with meals- none am of surgery  Lantus - Take 1/2 dose am of procedure     04/05/23- LVMM on home and cell phone of [phone] number and [phone] number.     PT was 23 minutes late for preop appt.  Wife with pt at preop appt.      PT states he has been taking Lantus instead of Basaglar per MD.  Also has been taking Lantus bid due to out of Novolog.  Per wife Novolog is on way. PT has been outtof Novolog for apporx 2 weeks per pt and glucose has been running higher due to out of Novolog.  Will check with pt to see when he returns to normal regimen.     HOH     CMP done 04/05/23 routed to Dr Milford Cage on 04/05/23.    Spoke with pt's wife on 04/12/23.  PT still has not received his Humalog Insulin per iwfe.  Wife states it is enroute.  PT still talking  lantus bid.  Wife sttaes his Dexcom monitor is also malfuncitoning.  PT checking glucose by sticking finger.   Wife was instructed by preop nurse for pt to take 1/2 dose of Lantus tonite ( nite before surgery ) and to take 1/2 dose Lantus Insuilin am of procedure.  Wife voiced understanding.  Wife also instructed for pt to eat a bedtime snack tonite on 04/12/23 before he goes to bed.  Wife voiced understanding.

## 2023-04-04 NOTE — Patient Instructions (Signed)
 SURGICAL WAITING ROOM VISITATION  Patients having surgery or a procedure may have no more than 2 support people in the waiting area - these visitors may rotate.    Children under the age of 64 must have an adult with them who is not the patient.  Due to an increase in RSV and influenza rates and associated hospitalizations, children ages 46 and under may not visit patients in [facility] hospitals.  Visitors with respiratory illnesses are discouraged from visiting and should remain at home.  If the patient needs to stay at the hospital during part of their recovery, the visitor guidelines for inpatient rooms apply. Pre-op nurse will coordinate an appropriate time for 1 support person to accompany patient in pre-op.  This support person may not rotate.    Please refer to the [facility] website for the visitor guidelines for Inpatients (after your surgery is over and you are in a regular room).       Your procedure is scheduled on: 04/13/2023    Report to [facility] Main Entrance    Report to admitting at   [phone]   Call this number if you have problems the morning of surgery [phone]   Do not eat food  or drink liquids :After Midnight.                              If you have questions, please contact your surgeon's office.        Oral Hygiene is also important to reduce your risk of infection.                                    Remember - BRUSH YOUR TEETH THE MORNING OF SURGERY WITH YOUR REGULAR TOOTHPASTE  DENTURES WILL BE REMOVED PRIOR TO SURGERY PLEASE DO NOT APPLY "Poly grip" OR ADHESIVES!!!   Do NOT smoke after Midnight   Stop all vitamins and herbal supplements 7 days before surgery.   Take these medicines the morning of surgery with A SIP OF WATER:  Amiodarone Hydralazinel Toprol l, Topamax    Metformin- none am of procedure,  Novolog- none am of procedure Basaglar-   DO NOT TAKE ANY ORAL DIABETIC MEDICATIONS DAY OF YOUR SURGERY  Bring CPAP  mask and tubing day of surgery.                              You may not have any metal on your body including hair pins, jewelry, and body piercing             Do not wear make-up, lotions, powders, perfumes/cologne, or deodorant  Do not wear nail polish including gel and S&S, artificial/acrylic nails, or any other type of covering on natural nails including finger and toenails. If you have artificial nails, gel coating, etc. that needs to be removed by a nail salon please have this removed prior to surgery or surgery may need to be canceled/ delayed if the surgeon/ anesthesia feels like they are unable to be safely monitored.   Do not shave  48 hours prior to surgery.               Men may shave face and neck.   Do not bring valuables to the hospital. [facility] IS NOT  RESPONSIBLE   FOR VALUABLES.   Contacts, glasses, dentures or bridgework may not be worn into surgery.   Bring small overnight bag day of surgery.   DO NOT BRING YOUR HOME MEDICATIONS TO THE HOSPITAL. PHARMACY WILL DISPENSE MEDICATIONS LISTED ON YOUR MEDICATION LIST TO YOU DURING YOUR ADMISSION IN THE HOSPITAL!    Patients discharged on the day of surgery will not be allowed to drive home.  Someone NEEDS to stay with you for the first 24 hours after anesthesia.   Special Instructions: Bring a copy of your healthcare power of attorney and living will documents the day of surgery if you haven't scanned them before.              Please read over the following fact sheets you were given: IF YOU HAVE QUESTIONS ABOUT YOUR PRE-OP INSTRUCTIONS PLEASE CALL [phone]   If you received a COVID test during your pre-op visit  it is requested that you wear a mask when out in public, stay away from anyone that may not be feeling well and notify your surgeon if you develop symptoms. If you test positive for Covid or have been in contact with anyone that has tested positive in the last 10 days please notify you  surgeon.    [name] - Preparing for Surgery Before surgery, you can play an important role.  Because skin is not sterile, your skin needs to be as free of germs as possible.  You can reduce the number of germs on your skin by washing with CHG (chlorahexidine gluconate) soap before surgery.  CHG is an antiseptic cleaner which kills germs and bonds with the skin to continue killing germs even after washing. Please DO NOT use if you have an allergy to CHG or antibacterial soaps.  If your skin becomes reddened/irritated stop using the CHG and inform your nurse when you arrive at Short Stay. Do not shave (including legs and underarms) for at least 48 hours prior to the first CHG shower.  You may shave your face/neck. Please follow these instructions carefully:  1.  Shower with CHG Soap the night before surgery and the  morning of Surgery.  2.  If you choose to wash your hair, wash your hair first as usual with your  normal  shampoo.  3.  After you shampoo, rinse your hair and body thoroughly to remove the  shampoo.                           4.  Use CHG as you would any other liquid soap.  You can apply chg directly  to the skin and wash                       Gently with a scrungie or clean washcloth.  5.  Apply the CHG Soap to your body ONLY FROM THE NECK DOWN.   Do not use on face/ open                           Wound or open sores. Avoid contact with eyes, ears mouth and genitals (private parts).                       Wash face,  Genitals (private parts) with your normal soap.             6.  Wash thoroughly,  paying special attention to the area where your surgery  will be performed.  7.  Thoroughly rinse your body with warm water from the neck down.  8.  DO NOT shower/wash with your normal soap after using and rinsing off  the CHG Soap.                9.  Pat yourself dry with a clean towel.            10.  Wear clean pajamas.            11.  Place clean sheets on your bed the night of your  first shower and do not  sleep with pets. Day of Surgery : Do not apply any lotions/deodorants the morning of surgery.  Please wear clean clothes to the hospital/surgery center.  FAILURE TO FOLLOW THESE INSTRUCTIONS MAY RESULT IN THE CANCELLATION OF YOUR SURGERY PATIENT SIGNATURE_________________________________  NURSE SIGNATURE__________________________________  ________________________________________________________________________

## 2023-04-05 ENCOUNTER — Other Ambulatory Visit: Payer: Self-pay

## 2023-04-05 ENCOUNTER — Encounter (HOSPITAL_COMMUNITY): Payer: Self-pay

## 2023-04-05 ENCOUNTER — Ambulatory Visit (HOSPITAL_COMMUNITY)
Admission: RE | Admit: 2023-04-05 | Discharge: 2023-04-05 | Disposition: A | Discharge status: Home / Self Care | Payer: Medicare Other | Source: Ambulatory Visit | Attending: Radiology | Admitting: Radiology

## 2023-04-05 ENCOUNTER — Encounter (HOSPITAL_COMMUNITY)
Admission: RE | Admit: 2023-04-05 | Discharge: 2023-04-05 | Disposition: A | Discharge status: Home / Self Care | Payer: Medicare Other | Source: Ambulatory Visit | Attending: Interventional Radiology

## 2023-04-05 VITALS — BP 173/85 | HR 64 | Temp 98.2°F | Resp 16 | Ht 70.5 in | Wt 160.0 lb

## 2023-04-05 DIAGNOSIS — E1351 Other specified diabetes mellitus with diabetic peripheral angiopathy without gangrene: Secondary | ICD-10-CM | POA: Insufficient documentation

## 2023-04-05 DIAGNOSIS — N2889 Other specified disorders of kidney and ureter: Secondary | ICD-10-CM | POA: Insufficient documentation

## 2023-04-05 DIAGNOSIS — Z79899 Other long term (current) drug therapy: Secondary | ICD-10-CM | POA: Insufficient documentation

## 2023-04-05 DIAGNOSIS — I10 Essential (primary) hypertension: Secondary | ICD-10-CM | POA: Insufficient documentation

## 2023-04-05 DIAGNOSIS — Z7984 Long term (current) use of oral hypoglycemic drugs: Secondary | ICD-10-CM | POA: Insufficient documentation

## 2023-04-05 DIAGNOSIS — Z794 Long term (current) use of insulin: Secondary | ICD-10-CM | POA: Insufficient documentation

## 2023-04-05 DIAGNOSIS — Z8679 Personal history of other diseases of the circulatory system: Secondary | ICD-10-CM | POA: Diagnosis not present

## 2023-04-05 DIAGNOSIS — E139 Other specified diabetes mellitus without complications: Secondary | ICD-10-CM

## 2023-04-05 DIAGNOSIS — Z01818 Encounter for other preprocedural examination: Secondary | ICD-10-CM | POA: Diagnosis present

## 2023-04-05 HISTORY — DX: Family history of ischemic heart disease and other diseases of the circulatory system: Z82.49

## 2023-04-05 HISTORY — DX: Unspecified convulsions: R56.9

## 2023-04-05 HISTORY — DX: Unspecified osteoarthritis, unspecified site: M19.90

## 2023-04-05 LAB — CBC WITH DIFFERENTIAL/PLATELET
Abs Immature Granulocytes: 0.02 10*3/uL (ref 0.00–0.07)
Basophils Absolute: 0.1 10*3/uL (ref 0.0–0.1)
Basophils Relative: 1 %
Eosinophils Absolute: 0.3 10*3/uL (ref 0.0–0.5)
Eosinophils Relative: 4 %
HCT: 37.5 % — ABNORMAL LOW (ref 39.0–52.0)
Hemoglobin: 12.9 g/dL — ABNORMAL LOW (ref 13.0–17.0)
Immature Granulocytes: 0 %
Lymphocytes Relative: 30 %
Lymphs Abs: 2.5 10*3/uL (ref 0.7–4.0)
MCH: 31.7 pg (ref 26.0–34.0)
MCHC: 34.4 g/dL (ref 30.0–36.0)
MCV: 92.1 fL (ref 80.0–100.0)
Monocytes Absolute: 0.7 10*3/uL (ref 0.1–1.0)
Monocytes Relative: 8 %
Neutro Abs: 4.7 10*3/uL (ref 1.7–7.7)
Neutrophils Relative %: 57 %
Platelets: 268 10*3/uL (ref 150–400)
RBC: 4.07 MIL/uL — ABNORMAL LOW (ref 4.22–5.81)
RDW: 12.9 % (ref 11.5–15.5)
WBC: 8.2 10*3/uL (ref 4.0–10.5)
nRBC: 0 % (ref 0.0–0.2)

## 2023-04-05 LAB — COMPREHENSIVE METABOLIC PANEL
ALT: 20 U/L (ref 0–44)
AST: 16 U/L (ref 15–41)
Albumin: 4.4 g/dL (ref 3.5–5.0)
Alkaline Phosphatase: 70 U/L (ref 38–126)
Anion gap: 9 (ref 5–15)
BUN: 28 mg/dL — ABNORMAL HIGH (ref 8–23)
CO2: 21 mmol/L — ABNORMAL LOW (ref 22–32)
Calcium: 10.1 mg/dL (ref 8.9–10.3)
Chloride: 108 mmol/L (ref 98–111)
Creatinine, Ser: 1.03 mg/dL (ref 0.61–1.24)
GFR, Estimated: >60 mL/min (ref >60)
Glucose, Bld: 254 mg/dL — ABNORMAL HIGH (ref 70–99)
Potassium: 4.7 mmol/L (ref 3.5–5.1)
Sodium: 138 mmol/L (ref 135–145)
Total Bilirubin: 0.3 mg/dL (ref 0.0–1.2)
Total Protein: 7.8 g/dL (ref 6.5–8.1)

## 2023-04-05 LAB — GLUCOSE, CAPILLARY: Glucose-Capillary: 243 mg/dL — ABNORMAL HIGH (ref 70–99)

## 2023-04-05 LAB — HEMOGLOBIN A1C
Hgb A1c MFr Bld: 7.5 % — ABNORMAL HIGH (ref 4.8–5.6)
Mean Plasma Glucose: 168.55 mg/dL

## 2023-04-05 LAB — PROTIME-INR
INR: 1 (ref 0.8–1.2)
Prothrombin Time: 12.9 s (ref 11.4–15.2)

## 2023-04-06 NOTE — Anesthesia Preprocedure Evaluation (Signed)
 Anesthesia Evaluation    Airway        Dental   Pulmonary former smoker          Cardiovascular hypertension,      Neuro/Psych    GI/Hepatic   Endo/Other  diabetes    Renal/GU      Musculoskeletal   Abdominal   Peds  Hematology   Anesthesia Other Findings   Reproductive/Obstetrics                             Anesthesia Physical Anesthesia Plan  ASA:   Anesthesia Plan:    Post-op Pain Management:    Induction:   PONV Risk Score and Plan:   Airway Management Planned:   Additional Equipment:   Intra-op Plan:   Post-operative Plan:   Informed Consent:   Plan Discussed with:   Anesthesia Plan Comments: (See PAT note 04/05/2023)       Anesthesia Quick Evaluation

## 2023-04-06 NOTE — Progress Notes (Signed)
 Anesthesia Chart Review  Date/Time: 04/13/23 0830   Procedures:      CT GUIDE TISSUE ABLATION     CT RENAL BIOPSY   Diagnosis: Right renal mass [N28.89]   Indications:      RT RENAL CRYO & BX     RENAL CRYO & BX   Location: [facility]       DISCUSSION:67 y.o. former smoker with h/o HTN, SVT on amiodarone, PAD, DM II, right renal mass scheduled for above procedure 04/13/2023 .  Pt last seen by cardiology 03/07/2023. Per OV note, "Preop - Pending renal cryoablation. According to the Revised Cardiac Risk Index (RCRI), his Perioperative Risk of Major Cardiac Event is (%): 6.6. His Functional Capacity in METs is: 8.33 according to the [facility] Activity Status Index (DASI). Per AHA/ACC guidelines, he is deemed acceptable risk for the planned procedure without additional cardiovascular testing. Will route to surgical team so they are aware. "  VS: BP (!) 173/85   Pulse 64   Temp 36.8 C (Oral)   Resp 16   Ht 5' 10.5" (1.791 m)   Wt 72.6 kg   SpO2 98%   BMI 22.63 kg/m   PROVIDERS: Porfirio Oar, PA is PCP   Cardiologist :  Chilton Si, MD  LABS: Labs reviewed: Acceptable for surgery. (all labs ordered are listed, but only abnormal results are displayed)  Labs Reviewed  CBC WITH DIFFERENTIAL/PLATELET - Abnormal; Notable for the following components:      Result Value   RBC 4.07 (*)    Hemoglobin 12.9 (*)    HCT 37.5 (*)    All other components within normal limits  COMPREHENSIVE METABOLIC PANEL - Abnormal; Notable for the following components:   CO2 21 (*)    Glucose, Bld 254 (*)    BUN 28 (*)    All other components within normal limits  HEMOGLOBIN A1C - Abnormal; Notable for the following components:   Hgb A1c MFr Bld 7.5 (*)    All other components within normal limits  GLUCOSE, CAPILLARY - Abnormal; Notable for the following components:   Glucose-Capillary 243 (*)    All other components within normal limits  PROTIME-INR      IMAGES:   EKG:   CV: Echo 06/22/2022 1. Left ventricular ejection fraction, by estimation, is 55 to 60%. Left  ventricular ejection fraction by 2D MOD biplane is 56.7 %. The left  ventricle has normal function. The left ventricle has no regional wall  motion abnormalities. Left ventricular  diastolic parameters are consistent with Grade I diastolic dysfunction  (impaired relaxation).   2. Right ventricular systolic function is normal. The right ventricular  size is normal. There is normal pulmonary artery systolic pressure. The  estimated right ventricular systolic pressure is 19.0 mmHg.   3. The mitral valve is grossly normal. Trivial mitral valve  regurgitation. No evidence of mitral stenosis.   4. The aortic valve is tricuspid. Aortic valve regurgitation is not  visualized. No aortic stenosis is present.   5. The inferior vena cava is normal in size with greater than 50%  respiratory variability, suggesting right atrial pressure of 3 mmHg.  Past Medical History:  Diagnosis Date   Arthritis    Cataracts, bilateral    Diabetes mellitus without complication (HCC)    DM (diabetes mellitus) (HCC)    FHx: SVT (supraventricular tachycardia)    Focal seizures (HCC)    hx of   History of basal cell carcinoma of skin  shoulder   HLD (hyperlipidemia)    Hypertension    Migraines    PAD (peripheral artery disease) (HCC)    Prediabetes     Past Surgical History:  Procedure Laterality Date   IR RADIOLOGIST EVAL & MGMT  02/11/2023   SPINE SURGERY  1994   TONSILLECTOMY  1961    MEDICATIONS:  insulin glargine (LANTUS) 100 UNIT/ML injection   amiodarone (PACERONE) 200 MG tablet   clonazePAM (KLONOPIN) 0.25 MG disintegrating tablet   gemfibrozil (LOPID) 600 MG tablet   hydrALAZINE (APRESOLINE) 100 MG tablet   insulin aspart (NOVOLOG) 100 UNIT/ML injection   Lacosamide 100 MG TABS   metFORMIN (GLUCOPHAGE) 500 MG tablet   metoprolol succinate (TOPROL-XL) 100 MG 24 hr  tablet   olmesartan (BENICAR) 20 MG tablet   spironolactone (ALDACTONE) 100 MG tablet   topiramate (TOPAMAX) 100 MG tablet   No current facility-administered medications for this encounter.    Jodell Cipro Ward, PA-C [facility] Pre-Surgical Testing [phone]

## 2023-04-08 ENCOUNTER — Other Ambulatory Visit (HOSPITAL_COMMUNITY): Payer: Self-pay

## 2023-04-08 ENCOUNTER — Encounter: Payer: Self-pay | Admitting: Cardiovascular Disease

## 2023-04-11 ENCOUNTER — Telehealth: Payer: Self-pay | Admitting: Cardiovascular Disease

## 2023-04-11 ENCOUNTER — Other Ambulatory Visit: Payer: Self-pay

## 2023-04-11 ENCOUNTER — Other Ambulatory Visit (HOSPITAL_COMMUNITY): Payer: Self-pay

## 2023-04-11 ENCOUNTER — Other Ambulatory Visit: Payer: Self-pay | Admitting: Radiology

## 2023-04-11 MED ORDER — OLMESARTAN MEDOXOMIL 20 MG PO TABS
20.0000 mg | ORAL_TABLET | Freq: Every day | ORAL | 2 refills | Status: DC
  Filled 2023-04-11: qty 30, 30d supply, fill #0

## 2023-04-11 NOTE — Telephone Encounter (Signed)
 Refill sent to local preferred pharmacy.

## 2023-04-11 NOTE — Telephone Encounter (Signed)
*  STAT* If patient is at the pharmacy, call can be transferred to refill team.   1. Which medications need to be refilled? (please list name of each medication and dose if known) olmesartan (BENICAR) 20 MG tablet   2. Which pharmacy/location (including street and city if local pharmacy) is medication to be sent to?   3. Do they need a 30 day or 90 day supply? 90

## 2023-04-12 ENCOUNTER — Other Ambulatory Visit (HOSPITAL_COMMUNITY): Payer: Self-pay

## 2023-04-12 ENCOUNTER — Other Ambulatory Visit: Payer: Self-pay | Admitting: Radiology

## 2023-04-12 NOTE — Anesthesia Preprocedure Evaluation (Signed)
 Anesthesia Evaluation  Patient identified by MRN, date of birth, ID band Patient awake    Reviewed: Allergy & Precautions, H&P , NPO status , Patient's Chart, lab work & pertinent test results  Airway Mallampati: I  TM Distance: >3 FB Neck ROM: Full    Dental no notable dental hx. (+) Edentulous Upper, Edentulous Lower   Pulmonary neg pulmonary ROS, former smoker   Pulmonary exam normal breath sounds clear to auscultation       Cardiovascular Exercise Tolerance: Good hypertension, + Peripheral Vascular Disease  negative cardio ROS Normal cardiovascular exam+ dysrhythmias Supra Ventricular Tachycardia  Rhythm:Regular Rate:Normal  Echo 06/22/2022 1. Left ventricular ejection fraction, by estimation, is 55 to 60%. Left  ventricular ejection fraction by 2D MOD biplane is 56.7 %. The left  ventricle has normal function. The left ventricle has no regional wall  motion abnormalities. Left ventricular  diastolic parameters are consistent with Grade I diastolic dysfunction  (impaired relaxation).   2. Right ventricular systolic function is normal. The right ventricular  size is normal. There is normal pulmonary artery systolic pressure. The  estimated right ventricular systolic pressure is 19.0 mmHg.   3. The mitral valve is grossly normal. Trivial mitral valve  regurgitation. No evidence of mitral stenosis.   4. The aortic valve is tricuspid. Aortic valve regurgitation is not  visualized. No aortic stenosis is present.   5. The inferior vena cava is normal in size with greater than 50%  respiratory variability, suggesting right atrial pressure of 3 mmHg.     Neuro/Psych  Headaches, Seizures -,  negative neurological ROS  negative psych ROS   GI/Hepatic negative GI ROS, Neg liver ROS,,,  Endo/Other  negative endocrine ROSdiabetes, Type 2    Renal/GU negative Renal ROS  negative genitourinary   Musculoskeletal negative  musculoskeletal ROS (+) Arthritis ,    Abdominal   Peds negative pediatric ROS (+)  Hematology negative hematology ROS (+)   Anesthesia Other Findings   Reproductive/Obstetrics negative OB ROS                             Anesthesia Physical Anesthesia Plan  ASA: 3  Anesthesia Plan: General   Post-op Pain Management: Minimal or no pain anticipated   Induction: Intravenous  PONV Risk Score and Plan: 2 and Ondansetron and Dexamethasone  Airway Management Planned: Oral ETT  Additional Equipment: None  Intra-op Plan:   Post-operative Plan: Extubation in OR  Informed Consent: I have reviewed the patients History and Physical, chart, labs and discussed the procedure including the risks, benefits and alternatives for the proposed anesthesia with the patient or authorized representative who has indicated his/her understanding and acceptance.       Plan Discussed with: Anesthesiologist and CRNA  Anesthesia Plan Comments: (See PAT note 04/05/2023 DISCUSSION:68 y.o. former smoker with h/o HTN, SVT on amiodarone, PAD, DM II, right renal mass scheduled for above procedure 04/13/2023 .   Pt last seen by cardiology 03/07/2023. Per OV note, "Preop - Pending renal cryoablation. According to the Revised Cardiac Risk Index (RCRI), his Perioperative Risk of Major Cardiac Event is (%): 6.6. His Functional Capacity in METs is: 8.33 according to the [facility] Activity Status Index (DASI). Per AHA/ACC guidelines, he is deemed acceptable risk for the planned procedure without additional cardiovascular testing. Will route to surgical team so they are aw )       Anesthesia Quick Evaluation

## 2023-04-12 NOTE — H&P (Addendum)
 Chief Complaint:  Right renal mass suspicious for renal cell carcinoma- image guided right renal biopsy and cryoablation  Referring Provider(s): Rhoderick Moody  Supervising Physician: Roanna Banning  Patient Status: [facility] - Out-pt  History of Present Illness: Kyle [name] is a 68 y.o. male with a history of type 2 diabetes, former smoker (2009), and CAD with SVT controlled with amioderone.  Pt presented to the emergency department on 06/27/22 for seizures - workup included a CT CAP that revealed an incidental finding of renal masses.  He is being followed by urology where a miltiphase MR abdomen was obtained 12/11/22 revealing a 2.4 cm right renal mass suspicious for renal cell carcinoma.  He was referred to interventional radiology for minimally invasive treatment due to his comorbidities.  He met with Dr. Milford Cage on 02/11/23 who discussed image guided right renal biopsy and cryoablation and subsequently decided to move forward with the procedure.  Pt was scheduled for image guided right renal mass biopsy and right renal mass cryoablation on 04/13/23.    Patient is Full Code  Past Medical History:  Diagnosis Date   Arthritis    Cataracts, bilateral    Diabetes mellitus without complication (HCC)    DM (diabetes mellitus) (HCC)    FHx: SVT (supraventricular tachycardia)    Focal seizures (HCC)    hx of   History of basal cell carcinoma of skin    shoulder   HLD (hyperlipidemia)    Hypertension    Migraines    PAD (peripheral artery disease) (HCC)    Prediabetes     Past Surgical History:  Procedure Laterality Date   IR RADIOLOGIST EVAL & MGMT  02/11/2023   SPINE SURGERY  1994   TONSILLECTOMY  1961    Allergies: Statins and Gabapentin  Medications: Prior to Admission medications   Medication Sig Start Date End Date Taking? Authorizing Provider  amiodarone (PACERONE) 200 MG tablet Take 1 tablet (200 mg total) by mouth daily. 09/13/22   Alver Sorrow, NP   clonazePAM (KLONOPIN) 0.25 MG disintegrating tablet Take 1 tablet (0.25 mg total) by mouth at bedtime. 10/07/22 03/07/23  Van Clines, MD  gemfibrozil (LOPID) 600 MG tablet Take 1 tablet (600 mg total) by mouth 2 (two) times daily before a meal. 06/07/17   Porfirio Oar, PA  hydrALAZINE (APRESOLINE) 100 MG tablet Take 100 mg by mouth 3 (three) times daily.    [provider]  insulin aspart (NOVOLOG) 100 UNIT/ML injection Inject 4 Units into the skin 3 (three) times daily with meals. Patient taking differently: Inject 6 Units into the skin 3 (three) times daily with meals. 07/09/22   Dorcas Carrow, MD  insulin glargine (LANTUS) 100 UNIT/ML injection Inject 18 Units into the skin daily. Pt takes in the am    [provider]  Lacosamide 100 MG TABS Take 1 tablet (100 mg total) by mouth 2 (two) times daily. 10/07/22   Van Clines, MD  metFORMIN (GLUCOPHAGE) 500 MG tablet Take 2 tablets (1,000 mg total) by mouth 2 (two) times daily with a meal. 12/07/16   Porfirio Oar, PA  metoprolol succinate (TOPROL-XL) 100 MG 24 hr tablet Take 1 tablet (100 mg total) by mouth daily. Take with or immediately following a meal. 09/13/22 03/07/23  Alver Sorrow, NP  olmesartan (BENICAR) 20 MG tablet Take 1 tablet (20 mg total) by mouth daily.**PLEASE KEEP UPCOMING VISIT WITH DR. [place] FOR FURTHER REFILLS.** 04/11/23   Chilton Si, MD  spironolactone (ALDACTONE)  100 MG tablet Take 1 tablet (100 mg total) by mouth daily. 03/09/23   Alver Sorrow, NP  topiramate (TOPAMAX) 100 MG tablet Take 1 and 1/2 tablets twice a day 10/07/22   Van Clines, MD     Family History  Problem Relation Age of Onset   Diabetes Father    Hypertension Father    Hypertension Mother    Hypertension Sister    Diabetes Sister        diet controlled    Social History   Socioeconomic History   Marital status: Married    Spouse name: Dennie Bible   Number of children: 2   Years of education: GED    Highest education level: Not on file  Occupational History   Occupation: retired 07/2015    Comment: former truck Hospital doctor, Museum/gallery exhibitions officer   Occupation: drives cars    Comment: [employer], Wednesdays   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   Daughters live in [place] and [place].   Son lives in [place].   Enjoying doing work around the house and yard.   Is thinking about part time work at some time.   Right handed   Social Drivers of Health   Financial Resource Strain: Low Risk  (05/25/2022)   Received from [facility], [facility]   Overall Financial Resource Strain (CARDIA)    Difficulty of Paying Living Expenses: Not very hard  Food Insecurity: No Food Insecurity (06/21/2022)   Hunger Vital Sign    Worried About Running Out of Food in the Last Year: Never true    Ran Out of Food in the Last Year: Never true  Transportation Needs: No Transportation Needs (06/21/2022)   PRAPARE - Administrator, Civil Service (Medical): No    Lack of Transportation (Non-Medical): No  Physical Activity: Insufficiently Active (05/25/2022)   Received from [facility], [facility]   Exercise Vital Sign    Days of Exercise per Week: 4 days    Minutes of Exercise per Session: 20 min  Stress: No Stress Concern Present (05/25/2022)   Received from [facility] - Occupational Stress Questionnaire    Feeling of Stress : Not at all  Social Connections:  Socially Integrated (05/25/2022)   Received from [facility], [facility]   Social Network    How would you rate your social network (family, work, friends)?: Good participation with social networks     Review of Systems: A 12 point ROS discussed and pertinent positives are indicated in the HPI above.  All other systems are negative.  Review of Systems  Constitutional:  Negative for fatigue and fever.  HENT:  Negative for congestion and dental problem.   Respiratory:  Negative for cough, chest tightness, shortness of breath and wheezing.   Cardiovascular:  Negative for chest pain.  Gastrointestinal:  Negative for  diarrhea, nausea and vomiting.  Neurological:  Negative for light-headedness and headaches.  Psychiatric/Behavioral:  Negative for agitation, behavioral problems and confusion.     Vital Signs: BP 139/67 (BP Location: Right Arm)   Pulse 66   Temp 98.1 F (36.7 C) (Oral)   Resp 16   Ht 5' 10.5" (1.791 m)   Wt 160 lb (72.6 kg)   SpO2 97%   BMI 22.63 kg/m   Advance Care Plan: The advanced care place/surrogate decision maker was discussed at the time of visit and the patient did not wish to discuss or was not able to name a surrogate decision maker or provide an advance care plan.  Physical Exam Constitutional:      Appearance: Normal appearance.  HENT:     Head: Normocephalic and atraumatic.     Mouth/Throat:     Mouth: Mucous membranes are moist.  Cardiovascular:     Rate and Rhythm: Normal rate and regular rhythm.  Pulmonary:     Effort: Pulmonary effort is normal.     Breath sounds: Normal breath sounds.  Abdominal:     General: Bowel sounds are normal.     Palpations: Abdomen is soft.  Musculoskeletal:        General: Normal range of motion.     Cervical back: Normal range of motion.  Skin:    General: Skin is warm and dry.  Neurological:     Mental Status: He is alert and oriented to person, place, and time.  Psychiatric:        Mood and Affect:  Mood normal.        Behavior: Behavior normal.     Imaging: DG Chest 1 View Result Date: 04/06/2023 CLINICAL DATA:  Preop exam for ablation. EXAM: CHEST  1 VIEW COMPARISON:  03/14/2016, CT 11/12/2022 FINDINGS: The cardiomediastinal contours are normal. The lungs are clear. Extrapleural opacity in the lateral right hemithorax corresponds to smoothly marginated pleural lesion on prior CT. Pulmonary vasculature is normal. No consolidation, pleural effusion, or pneumothorax. No acute osseous abnormalities are seen. IMPRESSION: No active disease. Electronically Signed   By: Narda Rutherford M.D.   On: 04/06/2023 11:53    Labs:  CBC: Recent Labs    07/05/22 0341 07/07/22 0135 10/01/22 1631 10/01/22 1640 04/05/23 1420  WBC 7.9 9.4 9.1  --  8.2  HGB 13.2 12.3* 14.1 14.3 12.9*  HCT 38.3* 34.9* 42.3 42.0 37.5*  PLT 347 327 299  --  268    COAGS: Recent Labs    05/19/22 1509 04/05/23 1420  INR 1.0 1.0  APTT 29  --     BMP: Recent Labs    07/06/22 0212 07/07/22 0135 09/13/22 1605 10/01/22 1631 10/01/22 1640 01/03/23 0805 01/24/23 0844 03/07/23 0809 04/05/23 1420  NA 138 138   < > 135   < > 143 140 143 138  K 4.1 3.2*   < > 3.8   < > 4.3 4.7 4.9 4.7  CL 114* 110   < > 104   < > 107* 105 108* 108  CO2 15* 18*   < > 18*   < > 18* 17* 20 21*  GLUCOSE 125* 166*   < > 346*   < > 140* 286* 163* 254*  BUN 20 22   < > 23   < > 24 30* 33* 28*  CALCIUM 9.8 9.7   < > 9.5   < > 10.0 10.3* 10.9* 10.1  CREATININE 1.08 1.10   < >  0.95   < > 1.11 1.51* 1.20 1.03  GFRNONAA >60 >60  --  >60  --   --   --   --  >60   < > = values in this interval not displayed.    LIVER FUNCTION TESTS: Recent Labs    09/13/22 1605 10/01/22 1631 01/03/23 0805 04/05/23 1420  BILITOT 0.2 0.3 0.3 0.3  AST 11 15 19 16   ALT 15 18 20 20   ALKPHOS 146* 96 105 70  PROT 7.0 7.0 7.1 7.8  ALBUMIN 4.7 3.7 4.8 4.4    TUMOR MARKERS: No results for input(s): "AFPTM", "CEA", "CA199", "CHROMGRNA" in the last  8760 hours.  Assessment and Plan:  Pt with right renal mass and possible right renal cell carcinoma scheduled for image guided right renal mass biopsy and right renal mass cryoablation 04/13/23.  He met with Dr. Milford Cage on 02/11/23 who discussed image guided right renal biopsy and cryoablation and subsequently decided to move forward with the procedure.    Risks and benefits of image guided right renal biopsy was discussed with the patient and/or patient's family including, but not limited to bleeding, infection, damage to adjacent structures or low yield requiring additional tests.  Risks and benefits of image guided renal cryoablation was discussed with the patient including, but not limited to, failure to treat entire lesion, bleeding, infection, damage to adjacent structures, hematuria, urine leak, decrease in renal function or post procedural neuropathy.  All of the patient's questions were answered and the patient is agreeable to proceed. Consent signed and in chart.  Thank you for allowing our service to participate in Yosiel Thieme 's care.  Electronically Signed: Loman Brooklyn, PA-C   04/13/2023, 7:54 AM    I spent a total of  15 Minutes in face to face in clinical consultation, greater than 50% of which was counseling/coordinating care for image guided right renal mass biopsy and right renal mass cryoablation.

## 2023-04-12 NOTE — H&P (Shared)
 Chief Complaint:  Right renal mass suspicious for renal cell carcinoma- image guided right renal biopsy and cryoablation  Referring Provider(s): Rhoderick Moody  Supervising Physician: Roanna Banning  Patient Status: [facility] - Out-pt  History of Present Illness: Kyle [name] is a 68 y.o. male with a history of type 2 diabetes, former smoker (2009), and CAD with SVT controlled with amioderone.  Pt presented to the emergency department on 06/27/22 for seizures - workup included a CT CAP that revealed an incidental finding of renal masses.  He is being followed by urology where a miltiphase MR abdomen was obtained 12/11/22 revealing a 2.4 cm right renal mass suspicious for renal cell carcinoma.  He was referred to interventional radiology for minimally invasive treatment due to his comorbidities.  He met with Dr. Milford Cage on 02/11/23 who discussed image guided right renal biopsy and cryoablation and subsequently decided to move forward with the procedure.  Pt was scheduled for image guided right renal mass biopsy and right renal mass cryoablation on 04/13/23.    Patient is Full Code  Past Medical History:  Diagnosis Date   Arthritis    Cataracts, bilateral    Diabetes mellitus without complication (HCC)    DM (diabetes mellitus) (HCC)    FHx: SVT (supraventricular tachycardia)    Focal seizures (HCC)    hx of   History of basal cell carcinoma of skin    shoulder   HLD (hyperlipidemia)    Hypertension    Migraines    PAD (peripheral artery disease) (HCC)    Prediabetes     Past Surgical History:  Procedure Laterality Date   IR RADIOLOGIST EVAL & MGMT  02/11/2023   SPINE SURGERY  1994   TONSILLECTOMY  1961    Allergies: Statins and Gabapentin  Medications: Prior to Admission medications   Medication Sig Start Date End Date Taking? Authorizing Provider  amiodarone (PACERONE) 200 MG tablet Take 1 tablet (200 mg total) by mouth daily. 09/13/22   Alver Sorrow, NP   clonazePAM (KLONOPIN) 0.25 MG disintegrating tablet Take 1 tablet (0.25 mg total) by mouth at bedtime. 10/07/22 03/07/23  Van Clines, MD  gemfibrozil (LOPID) 600 MG tablet Take 1 tablet (600 mg total) by mouth 2 (two) times daily before a meal. 06/07/17   Porfirio Oar, PA  hydrALAZINE (APRESOLINE) 100 MG tablet Take 100 mg by mouth 3 (three) times daily.    [provider]  insulin aspart (NOVOLOG) 100 UNIT/ML injection Inject 4 Units into the skin 3 (three) times daily with meals. Patient taking differently: Inject 6 Units into the skin 3 (three) times daily with meals. 07/09/22   Dorcas Carrow, MD  insulin glargine (LANTUS) 100 UNIT/ML injection Inject 18 Units into the skin daily. Pt takes in the am    [provider]  Lacosamide 100 MG TABS Take 1 tablet (100 mg total) by mouth 2 (two) times daily. 10/07/22   Van Clines, MD  metFORMIN (GLUCOPHAGE) 500 MG tablet Take 2 tablets (1,000 mg total) by mouth 2 (two) times daily with a meal. 12/07/16   Porfirio Oar, PA  metoprolol succinate (TOPROL-XL) 100 MG 24 hr tablet Take 1 tablet (100 mg total) by mouth daily. Take with or immediately following a meal. 09/13/22 03/07/23  Alver Sorrow, NP  olmesartan (BENICAR) 20 MG tablet Take 1 tablet (20 mg total) by mouth daily.**PLEASE KEEP UPCOMING VISIT WITH DR. [place] FOR FURTHER REFILLS.** 04/11/23   Chilton Si, MD  spironolactone (ALDACTONE)  100 MG tablet Take 1 tablet (100 mg total) by mouth daily. 03/09/23   Alver Sorrow, NP  topiramate (TOPAMAX) 100 MG tablet Take 1 and 1/2 tablets twice a day 10/07/22   Van Clines, MD     Family History  Problem Relation Age of Onset   Diabetes Father    Hypertension Father    Hypertension Mother    Hypertension Sister    Diabetes Sister        diet controlled    Social History   Socioeconomic History   Marital status: Married    Spouse name: Dennie Bible   Number of children: 2   Years of education: GED    Highest education level: Not on file  Occupational History   Occupation: retired 07/2015    Comment: former truck Hospital doctor, Museum/gallery exhibitions officer   Occupation: drives cars    Comment: [employer], Wednesdays   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   Daughters live in [place] and [place].   Son lives in [place].   Enjoying doing work around the house and yard.   Is thinking about part time work at some time.   Right handed   Social Drivers of Health   Financial Resource Strain: Low Risk  (05/25/2022)   Received from [facility], [facility]   Overall Financial Resource Strain (CARDIA)    Difficulty of Paying Living Expenses: Not very hard  Food Insecurity: No Food Insecurity (06/21/2022)   Hunger Vital Sign    Worried About Running Out of Food in the Last Year: Never true    Ran Out of Food in the Last Year: Never true  Transportation Needs: No Transportation Needs (06/21/2022)   PRAPARE - Administrator, Civil Service (Medical): No    Lack of Transportation (Non-Medical): No  Physical Activity: Insufficiently Active (05/25/2022)   Received from [facility], [facility]   Exercise Vital Sign    Days of Exercise per Week: 4 days    Minutes of Exercise per Session: 20 min  Stress: No Stress Concern Present (05/25/2022)   Received from [facility], [facility] - Occupational Stress Questionnaire    Feeling of Stress : Not at all  Social Connections:  Socially Integrated (05/25/2022)   Received from [facility], [facility]   Social Network    How would you rate your social network (family, work, friends)?: Good participation with social networks     Review of Systems: A 12 point ROS discussed and pertinent positives are indicated in the HPI above.  All other systems are negative.  Review of Systems  Constitutional:  Negative for fatigue and fever.  HENT:  Negative for congestion and dental problem.   Respiratory:  Negative for cough, chest tightness, shortness of breath and wheezing.   Cardiovascular:  Negative for chest pain.  Gastrointestinal:  Negative for

## 2023-04-13 ENCOUNTER — Encounter (HOSPITAL_COMMUNITY): Payer: Self-pay

## 2023-04-13 ENCOUNTER — Ambulatory Visit (HOSPITAL_COMMUNITY)
Admission: RE | Admit: 2023-04-13 | Discharge: 2023-04-13 | Disposition: A | Discharge status: Home / Self Care | Payer: Medicare Other | Source: Ambulatory Visit | Attending: Interventional Radiology | Admitting: Interventional Radiology

## 2023-04-13 ENCOUNTER — Encounter: Payer: Self-pay | Admitting: Cardiovascular Disease

## 2023-04-13 ENCOUNTER — Ambulatory Visit (HOSPITAL_COMMUNITY): Payer: Self-pay | Admitting: Certified Registered"

## 2023-04-13 ENCOUNTER — Encounter (HOSPITAL_COMMUNITY)
Admission: RE | Disposition: A | Discharge status: Home / Self Care | Payer: Self-pay | Source: Home / Self Care | Attending: Interventional Radiology

## 2023-04-13 ENCOUNTER — Encounter (HOSPITAL_COMMUNITY): Payer: Self-pay | Admitting: Physician Assistant

## 2023-04-13 ENCOUNTER — Other Ambulatory Visit (HOSPITAL_COMMUNITY): Payer: Self-pay

## 2023-04-13 ENCOUNTER — Other Ambulatory Visit: Payer: Self-pay

## 2023-04-13 ENCOUNTER — Ambulatory Visit (HOSPITAL_BASED_OUTPATIENT_CLINIC_OR_DEPARTMENT_OTHER): Payer: Self-pay | Admitting: Certified Registered"

## 2023-04-13 ENCOUNTER — Ambulatory Visit (HOSPITAL_COMMUNITY)
Admission: RE | Admit: 2023-04-13 | Discharge: 2023-04-13 | Disposition: A | Discharge status: Home / Self Care | Payer: Medicare Other | Attending: Interventional Radiology | Admitting: Interventional Radiology

## 2023-04-13 DIAGNOSIS — Z87891 Personal history of nicotine dependence: Secondary | ICD-10-CM | POA: Diagnosis not present

## 2023-04-13 DIAGNOSIS — Z794 Long term (current) use of insulin: Secondary | ICD-10-CM | POA: Insufficient documentation

## 2023-04-13 DIAGNOSIS — Z7984 Long term (current) use of oral hypoglycemic drugs: Secondary | ICD-10-CM | POA: Insufficient documentation

## 2023-04-13 DIAGNOSIS — Z01818 Encounter for other preprocedural examination: Secondary | ICD-10-CM

## 2023-04-13 DIAGNOSIS — E119 Type 2 diabetes mellitus without complications: Secondary | ICD-10-CM

## 2023-04-13 DIAGNOSIS — Z833 Family history of diabetes mellitus: Secondary | ICD-10-CM | POA: Diagnosis not present

## 2023-04-13 DIAGNOSIS — R569 Unspecified convulsions: Secondary | ICD-10-CM | POA: Insufficient documentation

## 2023-04-13 DIAGNOSIS — M1990 Unspecified osteoarthritis, unspecified site: Secondary | ICD-10-CM | POA: Diagnosis not present

## 2023-04-13 DIAGNOSIS — N2889 Other specified disorders of kidney and ureter: Secondary | ICD-10-CM

## 2023-04-13 DIAGNOSIS — I10 Essential (primary) hypertension: Secondary | ICD-10-CM | POA: Insufficient documentation

## 2023-04-13 DIAGNOSIS — Z8249 Family history of ischemic heart disease and other diseases of the circulatory system: Secondary | ICD-10-CM | POA: Diagnosis not present

## 2023-04-13 DIAGNOSIS — I739 Peripheral vascular disease, unspecified: Secondary | ICD-10-CM | POA: Insufficient documentation

## 2023-04-13 DIAGNOSIS — E785 Hyperlipidemia, unspecified: Secondary | ICD-10-CM

## 2023-04-13 HISTORY — PX: RADIOLOGY WITH ANESTHESIA: SHX6223

## 2023-04-13 LAB — GLUCOSE, CAPILLARY
Glucose-Capillary: 317 mg/dL — ABNORMAL HIGH (ref 70–99)
Glucose-Capillary: 253 mg/dL — ABNORMAL HIGH (ref 70–99)
Glucose-Capillary: 149 mg/dL — ABNORMAL HIGH (ref 70–99)

## 2023-04-13 SURGERY — IR WITH ANESTHESIA
Anesthesia: General | Laterality: Right

## 2023-04-13 MED ORDER — MIDAZOLAM HCL 2 MG/2ML IJ SOLN
INTRAMUSCULAR | Status: AC
  Filled 2023-04-13: qty 2

## 2023-04-13 MED ORDER — DOCUSATE SODIUM 100 MG PO CAPS
100.0000 mg | ORAL_CAPSULE | Freq: Every day | ORAL | 0 refills | Status: AC
  Filled 2023-04-13: qty 7, 7d supply, fill #0

## 2023-04-13 MED ORDER — LACTATED RINGERS IV SOLN: INTRAVENOUS | Status: DC

## 2023-04-13 MED ORDER — FENTANYL CITRATE PF 50 MCG/ML IJ SOSY
25.0000 ug | PREFILLED_SYRINGE | INTRAMUSCULAR | Status: DC | PRN
Start: 2023-04-13 — End: 2023-04-13

## 2023-04-13 MED ORDER — ACETAMINOPHEN 500 MG PO TABS
1000.0000 mg | ORAL_TABLET | Freq: Once | ORAL | Status: AC
  Administered 2023-04-13: 1000 mg via ORAL
  Filled 2023-04-13: qty 2

## 2023-04-13 MED ORDER — SUGAMMADEX SODIUM 200 MG/2ML IV SOLN
INTRAVENOUS | Status: DC | PRN
  Administered 2023-04-13: 150 mg via INTRAVENOUS

## 2023-04-13 MED ORDER — PROPOFOL 10 MG/ML IV BOLUS
INTRAVENOUS | Status: AC
  Filled 2023-04-13: qty 20

## 2023-04-13 MED ORDER — ACETAMINOPHEN 325 MG PO TABS: 325.0000 mg | ORAL_TABLET | ORAL | Status: DC | PRN

## 2023-04-13 MED ORDER — ONDANSETRON HCL 4 MG/2ML IJ SOLN
INTRAMUSCULAR | Status: DC | PRN
  Administered 2023-04-13: 4 mg via INTRAVENOUS

## 2023-04-13 MED ORDER — SODIUM CHLORIDE 0.9 % IV SOLN: INTRAVENOUS | Status: DC

## 2023-04-13 MED ORDER — OXYCODONE HCL 5 MG PO TABS
5.0000 mg | ORAL_TABLET | Freq: Four times a day (QID) | ORAL | 0 refills | Status: AC | PRN
  Filled 2023-04-13: qty 10, 3d supply, fill #0

## 2023-04-13 MED ORDER — OXYCODONE HCL 5 MG PO TABS: 5.0000 mg | ORAL_TABLET | Freq: Once | ORAL | Status: DC | PRN

## 2023-04-13 MED ORDER — EPHEDRINE SULFATE-NACL 50-0.9 MG/10ML-% IV SOSY
PREFILLED_SYRINGE | INTRAVENOUS | Status: DC | PRN
Start: 2023-04-13 — End: 2023-04-13
  Administered 2023-04-13: 5 mg via INTRAVENOUS

## 2023-04-13 MED ORDER — INSULIN ASPART 100 UNIT/ML IJ SOLN
0.0000 [IU] | INTRAMUSCULAR | Status: AC | PRN
  Administered 2023-04-13 (×2): 5 [IU] via SUBCUTANEOUS
  Filled 2023-04-13 (×2): qty 1

## 2023-04-13 MED ORDER — BUPIVACAINE HCL (PF) 0.5 % IJ SOLN
INTRAMUSCULAR | Status: AC
Start: 2023-04-13 — End: ?
  Filled 2023-04-13: qty 30

## 2023-04-13 MED ORDER — ROCURONIUM BROMIDE 10 MG/ML (PF) SYRINGE
PREFILLED_SYRINGE | INTRAVENOUS | Status: DC | PRN
  Administered 2023-04-13: 20 mg via INTRAVENOUS
  Administered 2023-04-13: 60 mg via INTRAVENOUS
  Administered 2023-04-13: 20 mg via INTRAVENOUS

## 2023-04-13 MED ORDER — SODIUM CHLORIDE (PF) 0.9 % IJ SOLN
INTRAMUSCULAR | Status: AC
  Filled 2023-04-13: qty 50

## 2023-04-13 MED ORDER — LIDOCAINE HCL (PF) 2 % IJ SOLN
INTRAMUSCULAR | Status: DC | PRN
  Administered 2023-04-13: 40 mg via INTRADERMAL

## 2023-04-13 MED ORDER — MIDAZOLAM HCL 2 MG/2ML IJ SOLN
INTRAMUSCULAR | Status: DC | PRN
  Administered 2023-04-13: 2 mg via INTRAVENOUS

## 2023-04-13 MED ORDER — ONDANSETRON HCL 4 MG/2ML IJ SOLN: 4.0000 mg | Freq: Once | INTRAMUSCULAR | Status: DC | PRN

## 2023-04-13 MED ORDER — DEXAMETHASONE SODIUM PHOSPHATE 10 MG/ML IJ SOLN
INTRAMUSCULAR | Status: DC | PRN
  Administered 2023-04-13: 4 mg via INTRAVENOUS

## 2023-04-13 MED ORDER — ORAL CARE MOUTH RINSE: 15.0000 mL | Freq: Once | OROMUCOSAL | Status: AC

## 2023-04-13 MED ORDER — SODIUM CHLORIDE 0.9 % IV SOLN
2.0000 g | Freq: Once | INTRAVENOUS | Status: AC
  Administered 2023-04-13: 2 g via INTRAVENOUS
  Filled 2023-04-13: qty 20

## 2023-04-13 MED ORDER — IOHEXOL 350 MG/ML SOLN
100.0000 mL | Freq: Once | INTRAVENOUS | Status: AC | PRN
Start: 2023-04-13 — End: 2023-04-13
  Administered 2023-04-13: 100 mL via INTRAVENOUS

## 2023-04-13 MED ORDER — CHLORHEXIDINE GLUCONATE 0.12 % MT SOLN
15.0000 mL | Freq: Once | OROMUCOSAL | Status: AC
  Administered 2023-04-13: 15 mL via OROMUCOSAL
  Filled 2023-04-13: qty 15

## 2023-04-13 MED ORDER — CHLORHEXIDINE GLUCONATE CLOTH 2 % EX PADS: 6.0000 | MEDICATED_PAD | Freq: Every day | CUTANEOUS | Status: DC

## 2023-04-13 MED ORDER — ACETAMINOPHEN 160 MG/5ML PO SOLN: 325.0000 mg | ORAL | Status: DC | PRN

## 2023-04-13 MED ORDER — SODIUM CHLORIDE 0.9 % IV SOLN
INTRAVENOUS | Status: AC
Start: 2023-04-13 — End: ?
  Filled 2023-04-13: qty 250

## 2023-04-13 MED ORDER — PHENYLEPHRINE HCL-NACL 20-0.9 MG/250ML-% IV SOLN
INTRAVENOUS | Status: DC | PRN
  Administered 2023-04-13: 10 ug/min via INTRAVENOUS

## 2023-04-13 MED ORDER — OXYCODONE HCL 5 MG PO TABS: 10.0000 mg | ORAL_TABLET | ORAL | Status: DC | PRN

## 2023-04-13 MED ORDER — FENTANYL CITRATE (PF) 100 MCG/2ML IJ SOLN
INTRAMUSCULAR | Status: DC | PRN
  Administered 2023-04-13: 50 ug via INTRAVENOUS

## 2023-04-13 MED ORDER — FENTANYL CITRATE (PF) 250 MCG/5ML IJ SOLN
INTRAMUSCULAR | Status: AC
  Filled 2023-04-13: qty 5

## 2023-04-13 MED ORDER — MEPERIDINE HCL 50 MG/ML IJ SOLN: 6.2500 mg | INTRAMUSCULAR | Status: DC | PRN

## 2023-04-13 MED ORDER — SODIUM CHLORIDE 0.9 % IV SOLN
INTRAVENOUS | Status: AC
  Filled 2023-04-13: qty 250

## 2023-04-13 MED ORDER — PROPOFOL 10 MG/ML IV BOLUS
INTRAVENOUS | Status: DC | PRN
  Administered 2023-04-13: 100 mg via INTRAVENOUS

## 2023-04-13 MED ORDER — OXYCODONE HCL 5 MG/5ML PO SOLN: 5.0000 mg | Freq: Once | ORAL | Status: DC | PRN

## 2023-04-13 NOTE — Discharge Instructions (Signed)
 May resume home medications, stay well-hydrated, avoid strenuous activity for 1 week; call [phone] with any questions related to your recent kidney procedure

## 2023-04-13 NOTE — Anesthesia Postprocedure Evaluation (Signed)
 Anesthesia Post Note  Patient: Kyle [name]  Procedure(s) Performed: CT cryoablation (Right)     Patient location during evaluation: PACU Anesthesia Type: General Level of consciousness: awake and alert and oriented Pain management: pain level controlled Vital Signs Assessment: post-procedure vital signs reviewed and stable Respiratory status: spontaneous breathing, nonlabored ventilation and respiratory function stable Cardiovascular status: blood pressure returned to baseline and stable Postop Assessment: no apparent nausea or vomiting Anesthetic complications: no   No notable events documented.  Last Vitals:  Vitals:   04/13/23 1115 04/13/23 1130  BP: (!) 168/87 (!) 159/75  Pulse: (!) 58 (!) 53  Resp: 10 13  Temp: 36.4 C   SpO2: 100% 97%    Last Pain:  Vitals:   04/13/23 1130  PainSc: 0-No pain                 Marilyn Nihiser A.

## 2023-04-13 NOTE — Anesthesia Procedure Notes (Addendum)
 Procedure Name: Intubation Date/Time: 04/13/2023 8:48 AM  Performed by: Sindy Guadeloupe, CRNAPre-anesthesia Checklist: Emergency Drugs available, Patient identified, Suction available and Patient being monitored Patient Re-evaluated:Patient Re-evaluated prior to induction Oxygen Delivery Method: Circle system utilized Preoxygenation: Pre-oxygenation with 100% oxygen Induction Type: IV induction Ventilation: Oral airway inserted - appropriate to patient size and Mask ventilation without difficulty Laryngoscope Size: Mac and 4 Grade View: Grade II Tube type: Oral Tube size: 7.5 mm Number of attempts: 1 Airway Equipment and Method: Stylet Placement Confirmation: ETT inserted through vocal cords under direct vision, positive ETCO2 and breath sounds checked- equal and bilateral Secured at: 23 cm Tube secured with: Tape Dental Injury: Teeth and Oropharynx as per pre-operative assessment

## 2023-04-13 NOTE — Procedures (Signed)
 Vascular and Interventional Radiology Procedure Note  Patient: Kyle [name] DOB: 1955-11-15 Medical Record Number: [id number] Note Date/Time: 04/13/23 8:43 AM   Performing Physician: Roanna Banning, MD Assistant(s): None  Diagnosis: R renal mass    Procedure:  PERCUTANEOUS CRYOABLATION of RIGHT RENAL MASS RIGHT RENAL MASS BIOPSY     Anesthesia: General Anesthesia Complications: None Estimated Blood Loss: Minimal Specimens: Sent for Pathology   Findings:  Successful CT-guided biopsy of R renal mass. A total of 3 samples were obtained.   Successful CT-guided cryoablation using a single IceForce 2.1 CX probe. Contrasted post-ablation CT with adequate lesional coverage.  Hemostasis of the tract was achieved using Manual Pressure.   Plan: Bed rest for 3 hours.  See detailed procedure note with images in PACS. The patient tolerated the procedure well without incident or complication and was returned to PACU in stable condition.    Roanna Banning, MD [facility]   Pager. [phone] Clinic. [phone]

## 2023-04-13 NOTE — Transfer of Care (Signed)
 Immediate Anesthesia Transfer of Care Note  Patient: Kyle [name]  Procedure(s) Performed: CT cryoablation (Right)  Patient Location: PACU  Anesthesia Type:General  Level of Consciousness: drowsy and patient cooperative  Airway & Oxygen Therapy: Patient Spontanous Breathing and Patient connected to face mask oxygen  Post-op Assessment: Report given to RN and Post -op Vital signs reviewed and stable  Post vital signs: Reviewed and stable  Last Vitals:  Vitals Value Taken Time  BP 186/82 04/13/23 1111  Temp    Pulse 54 04/13/23 1115  Resp 10 04/13/23 1114  SpO2 100 % 04/13/23 1115  Vitals shown include unfiled device data.  Last Pain: There were no vitals filed for this visit.       Complications: No notable events documented.

## 2023-04-13 NOTE — OR Nursing (Signed)
 V.O. received from Allred PA, 14Fr f/c in room to go with patient.

## 2023-04-13 NOTE — Discharge Summary (Signed)
 Patient ID: Kyle [name] MRN: [id number] DOB/AGE: Dec 11, 1955 68 y.o.  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 *  Past Medical History:  Diagnosis Date   Arthritis    Cataracts, bilateral    Diabetes mellitus without complication (HCC)    DM (diabetes mellitus) (HCC)    FHx: SVT (supraventricular tachycardia)    Focal seizures (HCC)    hx of   History of basal cell carcinoma of skin    shoulder   HLD (hyperlipidemia)    Hypertension    Migraines    PAD (peripheral artery disease) (HCC)    Prediabetes    Past Surgical History:  Procedure Laterality Date   IR RADIOLOGIST EVAL & MGMT  02/11/2023   SPINE SURGERY  1994   TONSILLECTOMY  1961     Discharged Condition: good  Hospital Course: Kyle [name] is a 68 y.o. male with a history of type 2 diabetes, former smoker (2009), and CAD with SVT controlled with amioderone.  Pt presented to the emergency department on 06/27/22 for seizures - workup included a CT CAP that revealed an incidental finding of renal masses.  He is being followed by urology where a miltiphase MR abdomen was obtained 12/11/22 revealing a 2.4 cm right renal mass suspicious for renal cell carcinoma.  He was referred to interventional radiology for minimally invasive treatment due to his comorbidities.  He met with Dr. Milford Cage on 02/11/23 who discussed image guided right renal mass biopsy and cryoablation  and underwent the procedure in CT on 04/13/2023 with general anesthesia.  The procedure was performed without immediate complications and the patient was subsequently extubated and transferred to PACU for observation.  Patient was  seen in PACU with Dr. Milford Cage following observation period and was stable.  He denied fever, headache, chest pain, dyspnea, cough, abdominal/flank pain, nausea, vomiting or bleeding.  He was able to tolerate his diet and void without difficulty.  He was deemed stable for discharge home at this time.  Patient will follow-up with Dr.Mugweru in IR clinic in 4 weeks. He may resume his home medications.  Electronic prescriptions were sent to [facility] outpatient pharmacy for oxycodone and colace. Pt was told to contact our team with any additional questions.   Consults: anesthesia  Significant Diagnostic Studies:  Results for orders placed or performed during the hospital encounter of 04/13/23  Glucose, capillary   Collection Time: 04/13/23  6:14 AM  Result Value Ref Range   Glucose-Capillary 317 (H) 70 - 99 mg/dL   Comment 1 Notify RN    Comment 2 Document in Chart   Glucose, capillary   Collection Time: 04/13/23  9:06 AM  Result Value Ref Range   Glucose-Capillary 253 (H) 70 - 99 mg/dL  Glucose, capillary   Collection Time: 04/13/23 11:15 AM  Result Value Ref Range   Glucose-Capillary 149 (H) 70 - 99 mg/dL   Comment 1 Document in Chart      Treatments: CT-guided cryoablation and biopsy of right renal mass with general anesthesia on 04/13/2023  Discharge Exam: Blood pressure (!) 148/64, pulse (!) 58, temperature 97.6 F (36.4 C), resp. rate 10, height 5' 10.5" (1.791 m), weight 160 lb (72.6 kg), SpO2 96%. Awake, alert.  Chest  clear to auscultation bilaterally.  Heart with regular rate and rhythm.  Abdomen soft, positive bowel sounds, nontender.  Puncture site right flank with clean, intact gauze bandage, nontender to palpation.  No lower extremity edema  Disposition: Discharge disposition: 01-Home or Self Care       Discharge Instructions     Call MD for:  difficulty breathing, headache or visual disturbances   Complete by: As directed    Call MD for:  extreme fatigue    Complete by: As directed    Call MD for:  hives   Complete by: As directed    Call MD for:  persistant dizziness or light-headedness   Complete by: As directed    Call MD for:  persistant nausea and vomiting   Complete by: As directed    Call MD for:  redness, tenderness, or signs of infection (pain, swelling, redness, odor or green/yellow discharge around incision site)   Complete by: As directed    Call MD for:  severe uncontrolled pain   Complete by: As directed    Call MD for:  temperature >100.4   Complete by: As directed    Change dressing (specify)   Complete by: As directed    May shower and take bandage off right flank region and replace with Band-Aid in 24 hours.   Diet - low sodium heart healthy   Complete by: As directed    Discharge instructions   Complete by: As directed    May resume home medications, avoid strenuous activity for 1 week; stay well hydrated   Driving Restrictions   Complete by: As directed    No driving for next 24 hours or after taking narcotic medication   Increase activity slowly   Complete by: As directed    Lifting restrictions   Complete by: As directed    Avoid strenuous activity for 1 week   May shower / Bathe   Complete by: As directed    May walk up steps   Complete by: As directed       Allergies as of 04/13/2023       Reactions   Statins    Leg cramps   Gabapentin Palpitations        Medication List     TAKE these medications    amiodarone 200 MG tablet Commonly known as: PACERONE Take 1 tablet (200 mg total) by mouth daily.   clonazePAM 0.25 MG disintegrating tablet Commonly known as: KLONOPIN Take 1 tablet (0.25 mg total) by mouth at bedtime.   docusate sodium 100 MG capsule Commonly known as: Colace Take 1 capsule (100 mg total) by mouth daily for 7 days.   gemfibrozil 600 MG tablet Commonly known as: LOPID Take 1 tablet (600 mg total) by mouth 2 (two) times daily before a meal.   hydrALAZINE 100 MG  tablet Commonly known as: APRESOLINE Take 100 mg by mouth 3 (three) times daily.   insulin aspart 100 UNIT/ML injection Commonly known as: novoLOG Inject 4 Units into the skin 3 (three) times daily with meals. What changed: how much to take   insulin glargine 100 UNIT/ML injection Commonly known as: LANTUS Inject 18 Units into the skin daily. Pt takes in the am   Lacosamide 100 MG Tabs Take 1 tablet (100 mg total) by mouth 2 (two) times daily.   metFORMIN 500 MG tablet Commonly known as: GLUCOPHAGE Take 2 tablets (1,000 mg total) by mouth 2 (two) times daily with a meal.   metoprolol  succinate 100 MG 24 hr tablet Commonly known as: TOPROL-XL Take 1 tablet (100 mg total) by mouth daily. Take with or immediately following a meal.   olmesartan 20 MG tablet Commonly known as: BENICAR Take 1 tablet (20 mg total) by mouth daily.**PLEASE KEEP UPCOMING VISIT WITH DR. [place] FOR FURTHER REFILLS.**   oxyCODONE 5 MG immediate release tablet Commonly known as: Roxicodone Take 1 tablet (5 mg total) by mouth every 6 (six) hours as needed for up to 10 doses for severe pain (pain score 7-10).   spironolactone 100 MG tablet Commonly known as: ALDACTONE Take 1 tablet (100 mg total) by mouth daily.   topiramate 100 MG tablet Commonly known as: TOPAMAX Take 1 and 1/2 tablets twice a day               Discharge Care Instructions  (From admission, onward)           Start     Ordered   04/13/23 0000  Change dressing (specify)       Comments: May shower and take bandage off right flank region and replace with Band-Aid in 24 hours.   04/13/23 1516            Follow-up Information     Mugweru, Cletis [place], MD Follow up.   Specialties: Interventional Radiology, Diagnostic Radiology, Radiology Why: Radiology team will contact you regarding follow-up with Dr. Milford Cage in 4 weeks; call [phone] with any questions Contact information: [address] [place]  [address] [phone]         Rene Paci, MD Follow up.   Specialty: Urology Why: Follow-up with Dr. Liliane Shi as scheduled Contact information: [address] [place] [address] [phone]                  Electronically Signed: D. Jeananne Rama, PA-C 04/13/2023, 3:21 PM   I have spent Less Than 30 Minutes discharging Kyle [name].

## 2023-04-14 ENCOUNTER — Encounter (HOSPITAL_COMMUNITY): Payer: Self-pay | Admitting: Interventional Radiology

## 2023-04-14 LAB — SURGICAL PATHOLOGY

## 2023-04-28 ENCOUNTER — Other Ambulatory Visit (HOSPITAL_COMMUNITY): Payer: Self-pay

## 2023-04-28 ENCOUNTER — Encounter: Payer: Self-pay | Admitting: Neurology

## 2023-04-28 ENCOUNTER — Ambulatory Visit (INDEPENDENT_AMBULATORY_CARE_PROVIDER_SITE_OTHER): Payer: Medicare Other | Admitting: Neurology

## 2023-04-28 VITALS — BP 163/76 | HR 66 | Ht 70.5 in | Wt 166.2 lb

## 2023-04-28 DIAGNOSIS — R9089 Other abnormal findings on diagnostic imaging of central nervous system: Secondary | ICD-10-CM | POA: Diagnosis not present

## 2023-04-28 DIAGNOSIS — G40201 Localization-related (focal) (partial) symptomatic epilepsy and epileptic syndromes with complex partial seizures, not intractable, with status epilepticus: Secondary | ICD-10-CM

## 2023-04-28 MED ORDER — TOPIRAMATE 100 MG PO TABS: ORAL_TABLET | ORAL | 3 refills | Status: DC

## 2023-04-28 MED ORDER — LACOSAMIDE 100 MG PO TABS
100.0000 mg | ORAL_TABLET | Freq: Two times a day (BID) | ORAL | 3 refills | Status: DC
  Filled 2023-04-28 – 2023-07-13 (×2): qty 180, 90d supply, fill #0
  Filled 2023-10-08: qty 180, 90d supply, fill #1

## 2023-04-28 MED ORDER — CLONAZEPAM 0.25 MG PO TBDP: 0.2500 mg | ORAL_TABLET | Freq: Every day | ORAL | 3 refills | Status: DC

## 2023-04-28 NOTE — Patient Instructions (Signed)
 Good to see you doing well!  Schedule follow-up brain MRI with and without contrast  2. Refills have been sent for your medications, continue Lacosamide 100mg  twice a day, Topiramate 100mg : take 1 and 1/2 tablet twice a day, and clonazepam 0.25mg  every night  3. Follow-up in 6 months, call for any changes   Seizure Precautions: 1. If medication has been prescribed for you to prevent seizures, take it exactly as directed.  Do not stop taking the medicine without talking to your doctor first, even if you have not had a seizure in a long time.   2. Avoid activities in which a seizure would cause danger to yourself or to others.  Don't operate dangerous machinery, swim alone, or climb in high or dangerous places, such as on ladders, roofs, or girders.  Do not drive unless your doctor says you may.  3. If you have any warning that you may have a seizure, lay down in a safe place where you can't hurt yourself.    4.  No driving for 6 months from last seizure, as per [facility].   Please refer to the following link on the [employer]ile[employer] of [employer]'s website for more information: http://www.epilepsyfoundation.org/answerplace/Social/driving/drivingu.cfm   5.  Maintain good sleep hygiene.  6.  Contact your doctor if you have any problems that may be related to the medicine you are taking.  7.  Call 911 and bring the patient back to the ED if:        A.  The seizure lasts longer than 5 minutes.       B.  The patient doesn't awaken shortly after the seizure  C.  The patient has new problems such as difficulty seeing, speaking or moving  D.  The patient was injured during the seizure  E.  The patient has a temperature over 102 F (39C)  F.  The patient vomited and now is having trouble breathing

## 2023-04-28 NOTE — Progress Notes (Signed)
 NEUROLOGY FOLLOW UP OFFICE NOTE  Kyle [name] [id number] December 03, 1955  HISTORY OF PRESENT ILLNESS: I had the pleasure of seeing Kyle [name] in follow-up in the neurology clinic on 04/28/2023.  The patient was last seen 7 months ago for seizures. He is alone in the office today.  Records and images were personally reviewed where available. Since his last visit, he underwent renal biopsy and ablation, he states he has not heard back about biopsy results yet. Since his last visit, he denies any seizures or seizure-like symptoms since 06/2022, he is on Topiramate 150mg  BID, Lacosamide 100mg  BID, and clonazepam 0.25mg  at bedtime without side effects. He denies any staring/unresponsive episodes, gaps in time, visual/olfactory/gustatory hallucinations, focal numbness/tingling/weakness, myoclonic jerks. No headaches, dizziness, vision changes, no falls. Sleep and mood are good. He was supposed to be have a repeat brain MRI last month but due to other medical issues, has been unable to do so yet. His brain MRI in 09/2022 showed magnetic susceptibility effect in the left occipital lobe with mild associated contrast enhancement that appears leptomeningeal, in close proximity to the region of signal change on prior MRI in 05/2022. This is noted to be new but may be a sequela of prior ischemia or hemorrhage.   History On Initial Assessment 08/03/2022: This is a pleasant 68 year old right-handed man with a history of hypertension, hyperlipidemia, DM, PAD, presenting for evaluation of new onset seizure. He was in the ER on 4/3 reporting 2 days of headache and right-sided vision changes. He describes intermittent flashing lights with 2 squares of primary colors on the right visual field lasting 30 seconds to 5 minutes. He would get confused, starting to talk then forget what he was trying to say. No focal numbness/tingling/weakness, jerking movements. He put his hand on the right inferior quadrant and would see a  shadow even when the flashes stopped. His wife noticed he would look to the right side and rub his fingers. His glucose levels were in the 300s to 400s due to medication cost. He had a brain MRI without contrast with no acute changes reported. He was back in the ER on 06/21/22 due to an increase in vision changes. During evaluation, he would become very slow to respond but would still be able to interact somewhat. There was variable right gaze preference during these events. He reported flashing lights on the right side of his vision and had a left inferior quadrantanopia on exam even after episodes resolved. MRI brain from 4/3 was reviewed and it was noted that there was a small curvilinear focus of susceptibility artifact in the left occipital lobe, likely a small focus of chronic hemosiderin staining that could serve as seizure focus. 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 He underwent video EEG monitoring from 5/6 to 5/15 with continued seizures occurring every hour, last seizure was 06/29/22. He required multiple seizure medications with increasing doses for seizure control, Levetiracetam, Vimpat, Topiramate, clonazepam, Perampanel. He became increasingly sedated and Levetiracetam was weaned off, clonazepam reduced, and Topamax stopped due to hyperammonemia. Overnight EEG on 5/20-21 showed diffuse slowing. Repeat brain MRI with and without contrast on 06/29/22 again showed subtle abnormality in the left occipital lobe with mildly heterogeneous diffusion and FLAIR, but also a larger area of asymmetrically decreased T2 white matter signal. He had a lumbar puncture with normal CSF, including paraneoplastic panel. Serum paraneoplastic panel also negative. CT chest/abdomen/pelvis showed a  heterogeneously enhancing mass in the right kidney. Symptoms favoring non-ketotic hyperglycemic occipital seizures. It was  recommended to repeat brain MRI and ambulatory EEG to help guide weaning of seizure medications. He was discharged home on 5/24 with note of poorly controlled DM, HbA1c was 11.6. Seizure medications on discharge were Topiramate 150mg  BID, Lacosamide 200mg  BID, Perampanel 4mg  at bedtime, and clonazepam 0.25mg  at bedtime with prn clonazepam for seizure rescue that he has not needed.   He initially had a headache but these have resolved. He has not had any visual changes since hospital discharge. He denies any prior history of seizures. He denies any headaches, dizziness. He has ran out of Fycompa for the past 4 days. He continues to fight with insurance for his DM medications. He has not been sleeping well, new since the seizures. No daytime drowsiness. He feels memory is good.   Epilepsy Risk Factors:  His mother had a few seizures later in life.He had a normal birth and early development.  There is no history of febrile convulsions, CNS infections such as meningitis/encephalitis, significant traumatic brain injury, neurosurgical procedures.  Prior ASMs: Levetiracetam, Vimpat  PAST MEDICAL HISTORY: Past Medical History:  Diagnosis Date   Arthritis    Cataracts, bilateral    Diabetes mellitus without complication (HCC)    DM (diabetes mellitus) (HCC)    FHx: SVT (supraventricular tachycardia)    Focal seizures (HCC)    hx of   History of basal cell carcinoma of skin    shoulder   HLD (hyperlipidemia)    Hypertension    Migraines    PAD (peripheral artery disease) (HCC)    Prediabetes     MEDICATIONS: Current Outpatient Medications on File Prior to Visit  Medication Sig Dispense Refill   amiodarone (PACERONE) 200 MG tablet Take 1 tablet (200 mg total) by mouth daily. 90 tablet 1   clonazePAM (KLONOPIN) 0.25 MG disintegrating tablet Take 1 tablet (0.25 mg total) by mouth at bedtime. 90 tablet 3   gemfibrozil (LOPID) 600 MG tablet Take 1 tablet (600 mg total) by mouth 2 (two) times daily  before a meal. 180 tablet 1   hydrALAZINE (APRESOLINE) 100 MG tablet Take 100 mg by mouth 3 (three) times daily.     insulin aspart (NOVOLOG) 100 UNIT/ML injection Inject 4 Units into the skin 3 (three) times daily with meals. (Patient taking differently: Inject 6 Units into the skin 3 (three) times daily with meals.) 10 mL 11   insulin glargine (LANTUS) 100 UNIT/ML injection Inject 18 Units into the skin daily. Pt takes in the am     Lacosamide 100 MG TABS Take 1 tablet (100 mg total) by mouth 2 (two) times daily. 180 tablet 3   metFORMIN (GLUCOPHAGE) 500 MG tablet Take 2 tablets (1,000 mg total) by mouth 2 (two) times daily with a meal. 360 tablet 3   metoprolol succinate (TOPROL-XL) 100 MG 24 hr tablet Take 1 tablet (100 mg total) by mouth daily. Take with or immediately following a meal. 90 tablet 1   olmesartan (BENICAR) 20 MG tablet Take 1 tablet (20 mg total) by mouth daily.**PLEASE KEEP UPCOMING VISIT WITH DR. Marshall FOR FURTHER REFILLS.** 30 tablet 2   oxyCODONE (ROXICODONE) 5 MG immediate release tablet Take 1 tablet (5 mg total) by mouth every 6 (six) hours as needed for up to 10 doses for severe pain (pain score 7-10). 10 tablet 0   spironolactone (ALDACTONE) 100 MG tablet Take 1 tablet (100 mg total) by mouth  daily. 90 tablet 3   topiramate (TOPAMAX) 100 MG tablet Take 1 and 1/2 tablets twice a day 270 tablet 3   No current facility-administered medications on file prior to visit.    ALLERGIES: Allergies  Allergen Reactions   Statins     Leg cramps   Gabapentin Palpitations    FAMILY HISTORY: Family History  Problem Relation Age of Onset   Diabetes Father    Hypertension Father    Hypertension Mother    Hypertension Sister    Diabetes Sister        diet controlled    SOCIAL HISTORY: Social History   Socioeconomic History   Marital status: Married    Spouse name: Dennie Bible   Number of children: 2   Years of education: GED   Highest education level: Not on file   Occupational History   Occupation: retired 07/2015    Comment: former truck Hospital doctor, Museum/gallery exhibitions officer   Occupation: drives cars    Comment: [employer], Wednesdays   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   Daughters live in [place] and [place].   Son lives in [place].   Enjoying doing work around the house and yard.   Is thinking about part time work at some time.   Right handed   Social Drivers of Health   Financial Resource Strain: Low Risk  (04/18/2023)   Received from [facility]   Overall Financial Resource Strain (CARDIA)    Difficulty of Paying Living Expenses: Not hard at all  Food Insecurity: No Food Insecurity (04/18/2023)   Received from [facility]   Hunger Vital Sign    Worried About Running Out of Food in the Last Year: Never true    Ran Out of Food in the Last Year: Never true  Transportation Needs: No Transportation Needs (04/18/2023)   Received from [facility] - Transportation    Lack of Transportation (Medical): No    Lack of Transportation (Non-Medical): No  Physical Activity: Insufficiently Active (04/18/2023)   Received from [facility]   Exercise Vital Sign    Days of Exercise per Week: 3 days    Minutes of Exercise per Session: 20 min  Stress: No Stress Concern Present (04/18/2023)   Received from [facility] - Occupational Stress Questionnaire    Feeling of Stress : Not at all  Social Connections: Moderately  Integrated (04/18/2023)   Received from [facility]   Social Network    How would you rate your social network (family, work, friends)?: Adequate participation with social networks  Intimate Partner Violence: Not At Risk (04/18/2023)   Received from [facility]   HITS    Over the last 12 months how often did your partner physically hurt you?: Never    Over the last 12 months how often did your partner insult you or talk down to  you?: Never    Over the last 12 months how often did your partner threaten you with physical harm?: Never    Over the last 12 months how often did your partner scream or curse at you?: Never     PHYSICAL EXAM: Vitals:   04/28/23 1114  BP: (!) 163/76  Pulse: 66  SpO2: 98%   General: No acute distress Head:  Normocephalic/atraumatic Skin/Extremities: No rash, no edema Neurological Exam: alert and awake. No aphasia or dysarthria. Fund of knowledge is appropriate.  Attention and concentration are normal.   Cranial nerves: Pupils equal, round. Extraocular movements intact with no nystagmus. Visual fields full.  No facial asymmetry.  Motor: Bulk and tone normal, muscle strength 5/5 throughout with no pronator drift.   Finger to nose testing intact.  Gait narrow-based and steady, able to tandem walk adequately.  Romberg negative.   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 He had focal sensory seizures with visual features in the setting of non-ketotic hyperglycemia. 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 This is noted to be new but may be a sequela of prior  ischemia or hemorrhage. We will do interval MRI brain with and without contrast for follow-up. He has not had any seizures since 06/2022, continue Topiramate 150mg  BID, Lacosamide 100mg  BID, and clonazepam 0.25mg  at bedtime. Continue glucose control. He is aware of [place] driving laws to stop driving after a seizure until 6 months seizure-free. Follow-up in 6 months, call for any changes.    Thank you for allowing me to participate in his care.  Please do not hesitate to call for any questions or concerns.    Patrcia Dolly, M.D.   CC: Porfirio Oar, [place]

## 2023-05-11 ENCOUNTER — Telehealth: Payer: Medicare Other

## 2023-05-11 ENCOUNTER — Telehealth: Payer: Self-pay | Admitting: Family

## 2023-05-11 NOTE — Telephone Encounter (Signed)
 Pt c/o medication issue:  1. Name of Medication:   olmesartan (BENICAR) 20 MG tablet    2. How are you currently taking this medication (dosage and times per day)? As written  3. Are you having a reaction (difficulty breathing--STAT)? none  4. What is your medication issue? Script was sent to wrong Pharmacy, Should be [facility] for 90 days  Pt was seen in Jan and has appt sched for May

## 2023-05-12 MED ORDER — OLMESARTAN MEDOXOMIL 20 MG PO TABS: 20.0000 mg | ORAL_TABLET | Freq: Every day | ORAL | 2 refills | Status: DC

## 2023-05-18 ENCOUNTER — Ambulatory Visit
Admission: RE | Admit: 2023-05-18 | Discharge: 2023-05-18 | Disposition: A | Discharge status: Home / Self Care | Source: Ambulatory Visit | Attending: Radiology

## 2023-05-18 DIAGNOSIS — N2889 Other specified disorders of kidney and ureter: Secondary | ICD-10-CM

## 2023-05-18 HISTORY — PX: IR RADIOLOGIST EVAL & MGMT: IMG5224

## 2023-05-18 NOTE — Progress Notes (Signed)
 Reason for visit: Follow up R renal mass Bx and ablation   Care Team: PCP:  Porfirio Oar, PA Urology: Rene Paci, MD Cardiology: Chilton Si, MD and Alver Sorrow, NP Neurology: Van Clines, MD     Virtual Visit via Video Conferencing   I connected with Kyle [name] on 05/18/23 by video-telephonic conference and verified that I am speaking with the correct person using two identifiers. I discussed the limitations, risks, security and privacy concerns of performing an evaluation and management service by tele-visit and the availability of in-person appointments.  History of present illness:  Kyle. Kyle [name] is a 68 y.o. 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 Pt w incidental renal masses on occult malignancy workup w CT CAP (06/27/22) after he presented to ER  w seizures. He was seen in urological followup, had multiphase Kyle abdomen (12/11/22) confirming a 2.4 cm R renal mass suspicious for RCC and ruling out a L renal mass as a benign proteinaceous cyst.   He was referred for minimally invasive treatment given his comorbidities and patient concern for recovery with surgical option.  He underwent successful cryoablation and biopsy on 04/13/23, and was discharged without event post procedure, same day. He reports that he is back to his baseline health and was back to work within a week, post procedure. He denies any discomfort, unintentional weight loss, hematuria or localizing symptoms.   Review of Systems: A 12-point ROS discussed, and pertinent positives are indicated in the HPI above.  All other systems are negative   Past Medical History:  Diagnosis Date   Arthritis    Cataracts, bilateral    Diabetes mellitus without complication (HCC)    DM (diabetes mellitus) (HCC)    FHx: SVT (supraventricular tachycardia)    Focal seizures (HCC)    hx of   History of basal cell  carcinoma of skin    shoulder   HLD (hyperlipidemia)    Hypertension    Migraines    PAD (peripheral artery disease) (HCC)    Prediabetes     Past Surgical History:  Procedure Laterality Date   IR RADIOLOGIST EVAL & MGMT  02/11/2023   RADIOLOGY WITH ANESTHESIA Right 04/13/2023   Procedure: CT cryoablation;  Surgeon: Roanna Banning, MD;  Location: [facility];  Service: Radiology;  Laterality: Right;   SPINE SURGERY  1994   TONSILLECTOMY  1961    Allergies: Statins and Gabapentin  Medications: Prior to Admission medications   Medication Sig Start Date End Date Taking? Authorizing Provider  amiodarone (PACERONE) 200 MG tablet Take 1 tablet (200 mg total) by mouth daily. 09/13/22   Alver Sorrow, NP  clonazePAM (KLONOPIN) 0.25 MG disintegrating tablet Take 1 tablet (0.25 mg total) by mouth at bedtime. 04/28/23 05/28/23  Van Clines, MD  gemfibrozil (LOPID) 600 MG tablet Take 1 tablet (600 mg total) by mouth 2 (two) times daily before a meal. 06/07/17   Porfirio Oar, PA  hydrALAZINE (APRESOLINE) 100 MG tablet Take 100 mg by mouth 3 (three) times daily.    [provider]  insulin aspart (NOVOLOG) 100 UNIT/ML injection Inject 4 Units into the skin 3 (three) times daily with meals. Patient taking differently: Inject 6 Units into the skin 3 (three) times daily with meals. 07/09/22   Dorcas Carrow, MD  insulin glargine (LANTUS) 100 UNIT/ML injection Inject 18 Units into the skin daily. Pt takes in the  am    [provider]  Lacosamide 100 MG TABS Take 1 tablet (100 mg total) by mouth 2 (two) times daily. 04/28/23   Van Clines, MD  metFORMIN (GLUCOPHAGE) 500 MG tablet Take 2 tablets (1,000 mg total) by mouth 2 (two) times daily with a meal. 12/07/16   Porfirio Oar, PA  metoprolol succinate (TOPROL-XL) 100 MG 24 hr tablet Take 1 tablet (100 mg total) by mouth daily. Take with or immediately following a meal. 09/13/22 04/28/23  Alver Sorrow, NP  olmesartan (BENICAR)  20 MG tablet Take 1 tablet (20 mg total) by mouth daily.**PLEASE KEEP UPCOMING VISIT WITH DR. Verona FOR FURTHER REFILLS.** 05/12/23   Alver Sorrow, NP  oxyCODONE (ROXICODONE) 5 MG immediate release tablet Take 1 tablet (5 mg total) by mouth every 6 (six) hours as needed for up to 10 doses for severe pain (pain score 7-10). 04/13/23   Loman Brooklyn, PA-C  spironolactone (ALDACTONE) 100 MG tablet Take 1 tablet (100 mg total) by mouth daily. 03/09/23   Alver Sorrow, NP  topiramate (TOPAMAX) 100 MG tablet Take 1 and 1/2 tablets twice a day 04/28/23   Van Clines, MD     Family History  Problem Relation Age of Onset   Diabetes Father    Hypertension Father    Hypertension Mother    Hypertension Sister    Diabetes Sister        diet controlled    Social History   Socioeconomic History   Marital status: Married    Spouse name: Dennie Bible   Number of children: 2   Years of education: GED   Highest education level: Not on file  Occupational History   Occupation: retired 07/2015    Comment: former truck Hospital doctor, Museum/gallery exhibitions officer   Occupation: drives cars    Comment: [employer], Wednesdays   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   Daughters live in [place] and [place].   Son lives in [place].   Enjoying doing work around the house and yard.   Is thinking about part time work at some time.   Right handed   Social Drivers of Health   Financial Resource Strain: Low Risk  (04/18/2023)   Received  [facility]   Overall Financial Resource Strain (CARDIA)    Difficulty of Paying Living Expenses: Not hard at all  Food Insecurity: No Food Insecurity (04/18/2023)   Received from [facility]   Hunger Vital Sign    Worried About Running Out of Food in the Last Year: Never true    Ran Out of Food in the Last Year: Never true  Transportation Needs: No Transportation Needs (04/18/2023)   Received from [facility] - Transportation    Lack of Transportation (Medical): No    Lack of Transportation (Non-Medical): No  Physical Activity: Insufficiently Active (04/18/2023)   Received from [facility]   Exercise Vital Sign    Days of Exercise per Week: 3 days    Minutes of Exercise per Session: 20 min  Stress: No Stress Concern Present (04/18/2023)   Received from [facility] - Occupational Stress Questionnaire    Feeling of Stress : Not at all  Social Connections: Moderately Integrated (04/18/2023)   [facility] from [facility]   Social Network    How would you rate your social network (family, work, friends)?: Adequate participation with social networks    Vital Signs: There were no vitals taken for this visit.  Physical Exam Deferred secondary to virtual visit.  Imaging:  IR R renal mass cryoablation: 04/13/23 Adequate R renal mass targeting and ablation.      No results found.  Labs:  CBC: Recent Labs    07/05/22 0341 07/07/22 0135 10/01/22 1631 10/01/22 1640 04/05/23 1420  WBC 7.9 9.4 9.1  --  8.2  HGB 13.2 12.3* 14.1 14.3 12.9*  HCT 38.3* 34.9* 42.3 42.0 37.5*  PLT 347 327 299  --  268    COAGS: Recent Labs    05/19/22 1509 04/05/23 1420  INR 1.0 1.0  APTT 29  --     BMP: Recent Labs    07/06/22 0212 07/07/22 0135 09/13/22 1605 10/01/22 1631 10/01/22 1640 01/03/23 0805 01/24/23 0844 03/07/23 0809 04/05/23 1420  NA 138 138   < > 135   < > 143 140 143 138  K 4.1 3.2*   < > 3.8   < >  4.3 4.7 4.9 4.7  CL 114* 110   < > 104   < > 107* 105 108* 108  CO2 15* 18*   < > 18*   < > 18* 17* 20 21*  GLUCOSE 125* 166*   < > 346*   < > 140* 286* 163* 254*  BUN 20 22   < > 23   < > 24 30* 33* 28*  CALCIUM 9.8 9.7   < > 9.5   < > 10.0 10.3* 10.9* 10.1  CREATININE 1.08 1.10   < > 0.95   < > 1.11 1.51* 1.20 1.03  GFRNONAA >60 >60  --  >60  --   --   --   --  >60   < > = values in this interval not displayed.    ONCOLOGY:  SURGICAL PATHOLOGY CASE: [id number] PATIENT: Kyle [name] Surgical Pathology Report  Clinical History: C/F RCC (las)  FINAL MICROSCOPIC DIAGNOSIS:  A. 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  Incidental renal masses on occult malignancy workup w CT CAP (06/27/22). Kyle abdomen (12/11/22) confirming a 2.4 cm R renal mass c/f RCC and ruling out a L renal mass as a benign proteinaceous cyst. Baseline sCr ~1.1.  Pt now s/p R renal mass biopsy and cryoablation on 04/13/23 Pathology positive for ccRCC    *Pt back to baseline. No post procedural concern. *3 mos follow up imaging w multiphasic Kyle post ablation (04/13/23), no sooner. *Will see him back in clinic, virtual option, for imaging review in 3 mos. *Pt will also follow up with his Urologist.   Thank you for allowing [place] to participate in the care of this Patient. Please contact me with questions, concerns, or  if new issues arise.   Electronically Signed:  Roanna Banning, MD [facility]   Pager. [phone] Clinic. [phone]  I spent a total of 30 Minutes of non-face-to-face time in clinical consultation, greater than 50% of which was counseling/coordinating care for Kyle. Kyle [name] treatment for R renal mass.  As part of this  video-telephonic encounter, no in-person exam was conducted.   The patient was physically located in [place] or a state in which I am permitted to provide care. The encounter was reasonable and appropriate under the circumstances given the patient's presentation at the time.

## 2023-05-23 ENCOUNTER — Ambulatory Visit: Payer: Medicare Other

## 2023-05-26 ENCOUNTER — Ambulatory Visit (INDEPENDENT_AMBULATORY_CARE_PROVIDER_SITE_OTHER): Payer: Medicare Other

## 2023-05-26 VITALS — BP 164/77 | HR 54 | Temp 97.6°F | Resp 18 | Ht 70.0 in | Wt 167.0 lb

## 2023-05-26 DIAGNOSIS — E782 Mixed hyperlipidemia: Secondary | ICD-10-CM | POA: Diagnosis not present

## 2023-05-26 MED ORDER — INCLISIRAN SODIUM 284 MG/1.5ML ~~LOC~~ SOSY
284.0000 mg | PREFILLED_SYRINGE | Freq: Once | SUBCUTANEOUS | Status: AC
  Administered 2023-05-26: 284 mg via SUBCUTANEOUS
  Filled 2023-05-26: qty 1.5

## 2023-05-26 NOTE — Progress Notes (Signed)
 Diagnosis: Hyperlipidemia  Provider:  Chilton Greathouse MD  Procedure: Injection  Leqvio (inclisiran), Dose: 284 mg, Site: subcutaneous, Number of injections: 1  Injection Site(s): Right arm  Post Care: Patient declined observation  Discharge: Condition: Good, Destination: Home . AVS Provided  Performed by:  Adriana Mccallum, RN

## 2023-06-11 ENCOUNTER — Ambulatory Visit
Admission: RE | Admit: 2023-06-11 | Discharge: 2023-06-11 | Disposition: A | Discharge status: Home / Self Care | Source: Ambulatory Visit | Attending: Neurology | Admitting: Neurology

## 2023-06-11 DIAGNOSIS — G40201 Localization-related (focal) (partial) symptomatic epilepsy and epileptic syndromes with complex partial seizures, not intractable, with status epilepticus: Secondary | ICD-10-CM

## 2023-06-11 DIAGNOSIS — R9089 Other abnormal findings on diagnostic imaging of central nervous system: Secondary | ICD-10-CM

## 2023-06-11 MED ORDER — GADOPICLENOL 0.5 MMOL/ML IV SOLN
7.5000 mL | Freq: Once | INTRAVENOUS | Status: AC | PRN
  Administered 2023-06-11: 7.5 mL via INTRAVENOUS

## 2023-07-06 ENCOUNTER — Ambulatory Visit (HOSPITAL_BASED_OUTPATIENT_CLINIC_OR_DEPARTMENT_OTHER): Payer: Medicare Other | Admitting: Cardiovascular Disease

## 2023-07-06 ENCOUNTER — Ambulatory Visit: Payer: Self-pay | Admitting: Neurology

## 2023-07-07 NOTE — Telephone Encounter (Signed)
-----   Message from Jhonny Moss sent at 07/06/2023 11:27 PM EDT ----- Pls let him know brain MRI looked good, changes seen on prior MRI have resolved. No tumor, stroke, or bleed. Thanks

## 2023-07-07 NOTE — Telephone Encounter (Signed)
 Pt called an informed that brain MRI looked good, changes seen on prior MRI have resolved. No tumor, stroke, or bleed

## 2023-07-13 ENCOUNTER — Encounter (HOSPITAL_BASED_OUTPATIENT_CLINIC_OR_DEPARTMENT_OTHER): Payer: Self-pay | Admitting: Cardiovascular Disease

## 2023-07-13 ENCOUNTER — Other Ambulatory Visit (HOSPITAL_COMMUNITY): Payer: Self-pay

## 2023-07-13 ENCOUNTER — Other Ambulatory Visit (HOSPITAL_BASED_OUTPATIENT_CLINIC_OR_DEPARTMENT_OTHER): Payer: Self-pay

## 2023-07-13 ENCOUNTER — Other Ambulatory Visit: Payer: Self-pay

## 2023-07-13 ENCOUNTER — Ambulatory Visit (INDEPENDENT_AMBULATORY_CARE_PROVIDER_SITE_OTHER): Payer: Medicare Other | Admitting: Cardiovascular Disease

## 2023-07-13 VITALS — BP 166/68 | HR 63 | Ht 70.5 in | Wt 166.5 lb

## 2023-07-13 DIAGNOSIS — I471 Supraventricular tachycardia, unspecified: Secondary | ICD-10-CM

## 2023-07-13 DIAGNOSIS — I10 Essential (primary) hypertension: Secondary | ICD-10-CM | POA: Diagnosis not present

## 2023-07-13 DIAGNOSIS — I700 Atherosclerosis of aorta: Secondary | ICD-10-CM

## 2023-07-13 DIAGNOSIS — I739 Peripheral vascular disease, unspecified: Secondary | ICD-10-CM

## 2023-07-13 DIAGNOSIS — E269 Hyperaldosteronism, unspecified: Secondary | ICD-10-CM

## 2023-07-13 DIAGNOSIS — E782 Mixed hyperlipidemia: Secondary | ICD-10-CM

## 2023-07-13 MED ORDER — FISH OIL 1000 MG PO CAPS
1000.0000 mg | ORAL_CAPSULE | Freq: Two times a day (BID) | ORAL | 3 refills | Status: AC
  Filled 2023-07-13: qty 160, 80d supply, fill #0

## 2023-07-13 MED ORDER — OLMESARTAN MEDOXOMIL 20 MG PO TABS: 20.0000 mg | ORAL_TABLET | Freq: Every day | ORAL | 3 refills | Status: DC

## 2023-07-13 NOTE — Patient Instructions (Addendum)
 Medication Instructions:  START FISH OIL  1,000 MG TWICE A DAY. IF YOU START TO GET A FISHY TASTE IN YOUR MOUTH YOU CAN PUT IN FREEZER WHICH SHOULD HELP   Labwork: RENIN/ALDOSTERONE LEVEL TODAY   Testing/Procedures: NONE  Follow-Up: 2 MONTHS WITH EITHER [facility] NP OR DR Starke IN [facility]   Any Other Special Instructions Will Be Listed Below (If Applicable).  Consider getting an Omron or Welch Allyn blood pressure cuff.  MONITOR YOUR BLOOD PRESSURE TWICE A DAY, LOG IN THE BOOK PROVIDED. BRING THE BOOK AND YOUR BLOOD PRESSURE MACHINE TO YOUR FOLLOW UP IN 2 MONTHS

## 2023-07-13 NOTE — Progress Notes (Signed)
 Cardiology Office Note:  .   Date:  07/13/2023  ID:  Kyle [name], DOB 01/29/1956, MRN [id number] PCP: Mannam, Praveen, MD  [facility] Providers Cardiologist:  Maudine Sos, MD    History of Present Illness: .   Kyle [name] is a 68 y.o. male with diabetes, hypertension, hyperaldosteronism, hyperlipidemia, PAD, SVT and seizures here for follow up.  He was seen in the ED 06/2022 with SVT.  He denied with visual disturbances.  He was started on metoprolol  and amiodarone .  Labetalol  was discontinued.  Echo that admission revealed LVEF within normal range and no significant valvular abnormalities.  He was noted to have a right renal mass concerning for RCC.  A1c was 11.6%.  He followed up with our pharmacy team and was started on Leqvio  and Vascpea.  He has been on spironolactone  for hyperaldosteronism. He underwent cryoablation of his R renal mass on 03/2023.  He last saw Neomi Banks, NP 02/2023.  Blood pressure was elevated in the office.  Spironolactone  was increased.  Discussed the use of AI scribe software for clinical note transcription with the patient, who gave verbal consent to proceed.  History of Present Illness Kyle [name] has been monitoring his blood pressure at home using an old cuff that belonged to his parents. His home readings typically range from 110 to 130 mmHg systolic and 60 to 70 mmHg diastolic, which are generally lower than those recorded in the clinic, where readings have been as high as 175/85 mmHg.  He has a history of hyperaldosteronism, diagnosed years ago, and is currently on spironolactone  to manage this condition. Previously, his blood pressure was low when he was discharged from the hospital after being taken off spironolactone , but it began to rise again, prompting him to resume the medication. He has not undergone adrenal nodule removal.  He is on multiple medications for blood pressure and heart rhythm management, including amiodarone ,  hydralazine , metoprolol , and olmesartan . No recent episodes of supraventricular tachycardia have occurred, and his heart rate is usually between 60 and 70 beats per minute.  For cholesterol management, he is on gemfibrozil  due to high triglycerides. He has a known allergy to statins, having experienced leg cramps with Lipitor, which led to discontinuation. His LDL cholesterol is well-controlled, but triglycerides remain elevated.  In terms of family history, both his parents had hypertension, and he inherited their blood pressure cuff. 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 No chest pain, pressure, or breathing difficulties. No swelling reported.   ROS:  As per HPI  Studies Reviewed: .       Echo 06/2022: 1. Left ventricular ejection fraction, by estimation, is 55 to 60%. Left  ventricular ejection fraction by 2D MOD biplane is 56.7 %. The left  ventricle has normal function. The left ventricle has no regional wall  motion abnormalities. Left ventricular  diastolic parameters are consistent with Grade I diastolic dysfunction  (impaired relaxation).   2. Right ventricular systolic function is normal. The right ventricular  size is normal. There is normal pulmonary artery systolic pressure. The  estimated right ventricular systolic pressure is 19.0 mmHg.   3. The mitral valve is grossly normal. Trivial mitral valve  regurgitation. No evidence of mitral stenosis.   4. The aortic valve is tricuspid. Aortic valve regurgitation is not  visualized. No aortic stenosis is present.   5. The inferior vena cava is normal in size with greater than 50%  respiratory variability,  suggesting right atrial pressure of 3 mmHg.   Risk Assessment/Calculations:     HYPERTENSION CONTROL Vitals:   07/13/23 0920 07/13/23 0955  BP: (!) 158/70 (!) 166/68    The patient's blood pressure is elevated above target today.  In order to  address the patient's elevated BP:           Physical Exam:   VS:  BP (!) 166/68   Pulse 63   Ht 5' 10.5" (1.791 m)   Wt 166 lb 8 oz (75.5 kg)   SpO2 98%   BMI 23.55 kg/m  , BMI Body mass index is 23.55 kg/m. GENERAL:  Well appearing HEENT: Pupils equal round and reactive, fundi not visualized, oral mucosa unremarkable NECK:  No jugular venous distention, waveform within normal limits, carotid upstroke brisk and symmetric, no bruits, no thyromegaly LUNGS:  Clear to auscultation bilaterally HEART:  RRR.  PMI not displaced or sustained,S1 and S2 within normal limits, no S3, no S4, no clicks, no rubs, no murmurs ABD:  Flat, positive bowel sounds normal in frequency in pitch, no bruits, no rebound, no guarding, no midline pulsatile mass, no hepatomegaly, no splenomegaly EXT:  2 plus pulses throughout, no edema, no cyanosis no clubbing SKIN:  No rashes no nodules NEURO:  Cranial nerves II through XII grossly intact, motor grossly intact throughout PSYCH:  Cognitively intact, oriented to person place and time   ASSESSMENT AND PLAN: .    Assessment & Plan # Hyperaldosteronism Chronic hyperaldosteronism with elevated blood pressure, managed with spironolactone . Adrenal adenoma on CT 06/2022.  Potential surgical intervention if blood pressure remains uncontrolled. - Check aldosterone and renin levels today. - Instruct him to monitor blood pressure at home with a new cuff. - Reassess blood pressure control in a couple of months. - If BP remains uncontrolled on current regimen, recommend adrenal vein sampling off spironolactone  and olmesartan  to consider surgical removal of adrenal adenoma  # Hypertension Hypertension likely secondary to hyperaldosteronism, managed with spironolactone , hydralazine , metoprolol , and olmesartan . Accurate home monitoring essential for surgical decision-making. - Instruct him to purchase a new blood pressure cuff (Omron or Welch-Allyn recommended). - Instruct  him to record home blood pressure readings. - Reassess blood pressure control in a couple of months.  # Supraventricular Tachycardia (SVT) SVT managed with amiodarone  and metoprolol . Heart rate well-managed between 60-70 bpm. - Check TSH.  LFTs are wnl.  - Order PFTs at follow up.   # Hypertriglyceridemia Elevated triglycerides with statin intolerance, managed with gemfibrozil . Considering switch to Vascepa  pending cost evaluation. - Consulted pharmacist regarding cost of Vascepa  which is high. - Add fish oil  2000mg  daily    Dispo: f/u in [facility] clinic in 2 months  Signed, Maudine Sos, MD

## 2023-07-17 LAB — ALDOSTERONE + RENIN ACTIVITY W/ RATIO
Aldos/Renin Ratio: 7.1 (ref 0.0–30.0)
Aldosterone: 17.6 ng/dL (ref 0.0–30.0)
Renin Activity, Plasma: 2.477 ng/mL/h (ref 0.167–5.380)

## 2023-07-22 ENCOUNTER — Ambulatory Visit: Payer: Self-pay | Admitting: Cardiovascular Disease

## 2023-07-23 ENCOUNTER — Other Ambulatory Visit (HOSPITAL_BASED_OUTPATIENT_CLINIC_OR_DEPARTMENT_OTHER): Payer: Self-pay

## 2023-08-03 ENCOUNTER — Other Ambulatory Visit: Payer: Self-pay | Admitting: Interventional Radiology

## 2023-08-03 DIAGNOSIS — N2889 Other specified disorders of kidney and ureter: Secondary | ICD-10-CM

## 2023-09-06 ENCOUNTER — Ambulatory Visit
Admission: RE | Admit: 2023-09-06 | Discharge: 2023-09-06 | Disposition: A | Discharge status: Home / Self Care | Source: Ambulatory Visit | Attending: Interventional Radiology | Admitting: Interventional Radiology

## 2023-09-06 DIAGNOSIS — N2889 Other specified disorders of kidney and ureter: Secondary | ICD-10-CM

## 2023-09-06 MED ORDER — GADOPICLENOL 0.5 MMOL/ML IV SOLN
8.0000 mL | Freq: Once | INTRAVENOUS | Status: AC | PRN
  Administered 2023-09-06: 8 mL via INTRAVENOUS

## 2023-09-15 ENCOUNTER — Encounter (HOSPITAL_BASED_OUTPATIENT_CLINIC_OR_DEPARTMENT_OTHER): Payer: Self-pay

## 2023-09-15 ENCOUNTER — Ambulatory Visit (INDEPENDENT_AMBULATORY_CARE_PROVIDER_SITE_OTHER): Admitting: Family

## 2023-09-15 ENCOUNTER — Encounter (HOSPITAL_BASED_OUTPATIENT_CLINIC_OR_DEPARTMENT_OTHER): Payer: Self-pay | Admitting: Family

## 2023-09-15 VITALS — BP 148/70 | HR 55 | Ht 70.0 in | Wt 168.4 lb

## 2023-09-15 DIAGNOSIS — I10 Essential (primary) hypertension: Secondary | ICD-10-CM | POA: Diagnosis not present

## 2023-09-15 DIAGNOSIS — Z79899 Other long term (current) drug therapy: Secondary | ICD-10-CM

## 2023-09-15 DIAGNOSIS — I471 Supraventricular tachycardia, unspecified: Secondary | ICD-10-CM

## 2023-09-15 NOTE — Patient Instructions (Signed)
 Medication Instructions:  Continue your current medications    Follow-Up: Please follow up in 3-4 months in [facility] with Dr. Raford, Reche Finder, NP or Allean Mink PharmD    Special Instructions:    If you will, please bring BP cuff to next office visit.   Check blood pressure in the evening at least an hour after medications. This gets a more accurate measurement of BP.  If possible, take Hydralazine  as close to 8 hours apart as possible for most effective dosing

## 2023-09-15 NOTE — Progress Notes (Signed)
 Cardiology Office Note:  .   Date:  09/15/2023  ID:  Kyle [name], DOB 07-26-1955, MRN [id number] PCP: Juliane Che, PA  [facility] Providers Cardiologist:  Annabella Scarce, MD    History of Present Illness: .   Kyle [name] is a 68 y.o. male with history of DM 2, HTN, HLD, PAD, SVT, seizure, hyperaldosteronism.   ED visit 05/18/2022 with visual disturbance x 2 days with CT head, MRI brain, neurological test unremarkable.  Presented 06/20/2022 with palpitations, lightheadedness, dizziness, presyncopal episodes.  Found to be in SVT.  He also had focal seizures.  MRI brain 5/14 septal abnormality left occipital lobe.  Continuous EEG demonstrated seizures.  He was started on Toprol , amiodarone  for SVT.  Labetalol  was discontinued.  Lisinopril , spironolactone  held due to AKI which normalized prior to discharge.  Echocardiogram during admission normal LVEF, no significant valvular maladies.  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  His A1c was 11.6 and was discharged on insulin .   He saw pharmacy team 07/23/2022 for lipid management and was started on Vascepa  and Leqvio . At visit 09/13/22 kidney function stable and Spironolactone  resumed at 25mg  daily.    At visit 12/2022 Olmesartan  was added for BP control. At visit 03/07/23 Spironolactone  increased to 100mg  daily. On 04/13/23 had right renal mass biopsy and croablation.   07/13/23 seen by Dr. Scarce. Instructed to purchase new BP cuff for accurate home monitoring. As vascepa  cost prohibitive, fish oil  2,000mg  daily added. Recommended if BP high at follow up consider adrenal vein sampling off spiro and olmesartan  to consider surgical removal of adrenal adenoma  Presents today for follow up independently. Reports feeling overall well. He has purchased a new upper arm Omron BP cuff and is monitoring BP at home twice per day. BP recently at PCP 07/26/23 of 116/78. His heart rate at home is  most often 60s with occasional readings in 50s or 70s. Average BP over the last 11 readings of 133/71. Of note, takes evening meds around 5pm. Checking BP in the evening around 5-6. Encouraged to check BP at least an hour after medications and to try to space out Hydralazine  ~q8h. BP range at home 106/61-157/70.   Reports no shortness of breath nor dyspnea on exertion. Reports no chest pain, pressure, or tightness. No edema, orthopnea, PND. Reports no palpitations.    ROS: Please see the history of present illness.    All other systems reviewed and are negative.   Studies Reviewed: .             Risk Assessment/Calculations:     HYPERTENSION CONTROL Vitals:   09/15/23 0850 09/15/23 0911  BP: (!) 149/75 (!) 148/70    The patient's blood pressure is elevated above target today.  In order to address the patient's elevated BP:           Physical Exam:   VS:  BP (!) 148/70   Pulse (!) 55   Ht 5' 10 (1.778 m)   Wt 168 lb 6.4 oz (76.4 kg)   SpO2 97%   BMI 24.16 kg/m    Wt Readings from Last 3 Encounters:  09/15/23 168 lb 6.4 oz (76.4 kg)  07/13/23 166 lb 8 oz (75.5 kg)  05/26/23 167 lb (75.8 kg)    GEN: Well nourished, well developed in no acute distress NECK: No JVD; No carotid bruits CARDIAC: RRR, no murmurs, rubs, gallops RESPIRATORY:  Clear to auscultation without rales, wheezing or rhonchi  ABDOMEN: Soft, non-tender, non-distended EXTREMITIES:  No edema; No deformity   ASSESSMENT AND PLAN: .    SVT / On Amiodarone  therapy- Diagnosed during 06/2022 admission.  Echo 06/2022 normal LVEF, no significant valvular abnormalities.  Monitor 08/2022 with two brief <20 second episodes of SVT which were asymptomatic. No recent palpitations.  Amiodarone  monitoring: 01/04/23 TSH 1.51, 02/23/23 ALT 20  Aortic atherosclerosis/ HLD, LDL goal <70 / Hypertriglyceridemia- Presently on Fish oil . Vascepa  cost prohibitive.  01/03/2023 HDL 40, triglycerides 267, LDL 26, total cholesterol  106.07/25/24 total cholesterol 105, HDL 32, triglycerides 252, LDL 34.   HTN - BP reasonably controlled by home readings with average over last 10 readings of 133/71. Continue Hydralazine  100mg  TID, Toprol  100mg  daily, Olmesartan  20mg  daily, Spironolactone  100mg  daily.  Encouraged to space Hydralazine  about 8 hours apart for best BP control. Instructed to check BP in the evening an hour after medication as presently checking around same time as he takes mediations which may be leading to higher readings.  Check in via My Chart in 3 weeks on BP readings.  Secondary hypertension workup 2014 renal duplex with no stenosis. CT 06/2022 normal adrenal glands. Hyperaldosteronism, as below.   Hyperaldosteronism / Hypokalemia- prior evaluation at [facility] in 2009 with aldosterone 18.9.  He was started on spironolactone  with subsequent normalization of aldosterone and improvement in BP control.  CT 06/2022 normal adrenal glands. Labs 07/13/23 while on spironolactone  and olmesartan  of aldosterone 17.6, renin 2.477, aldos/renin ratio 7.1 unremarkable. If BP persistently difficult to control, consider holding spironolactone  and olmesartan  with adrenal vein samples. As BP reasonably controlled by recent readings, will defer at this time.        Dispo: follow up in 3-4 mos with [facility]   Signed, Reche GORMAN Finder, NP

## 2023-10-10 ENCOUNTER — Other Ambulatory Visit: Payer: Self-pay

## 2023-11-24 ENCOUNTER — Ambulatory Visit (INDEPENDENT_AMBULATORY_CARE_PROVIDER_SITE_OTHER): Admitting: Neurology

## 2023-11-24 ENCOUNTER — Encounter: Payer: Self-pay | Admitting: Neurology

## 2023-11-24 ENCOUNTER — Other Ambulatory Visit (HOSPITAL_COMMUNITY): Payer: Self-pay

## 2023-11-24 VITALS — BP 170/74 | HR 66 | Ht 70.5 in | Wt 170.0 lb

## 2023-11-24 DIAGNOSIS — G40201 Localization-related (focal) (partial) symptomatic epilepsy and epileptic syndromes with complex partial seizures, not intractable, with status epilepticus: Secondary | ICD-10-CM | POA: Diagnosis not present

## 2023-11-24 MED ORDER — TOPIRAMATE 100 MG PO TABS: ORAL_TABLET | ORAL | 3 refills | Status: AC

## 2023-11-24 MED ORDER — LACOSAMIDE 100 MG PO TABS
100.0000 mg | ORAL_TABLET | Freq: Two times a day (BID) | ORAL | 3 refills | Status: AC
  Filled 2023-11-24 – 2024-01-09 (×2): qty 180, 90d supply, fill #0

## 2023-11-24 NOTE — Patient Instructions (Signed)
 Good to see you.  Continue all your medications.  2. Pls call our office in 2 weeks and we will send in the next refills for Clonazepam  to [facility]  3. Follow-up in 6 months, call for any changes   Seizure Precautions: 1. If medication has been prescribed for you to prevent seizures, take it exactly as directed.  Do not stop taking the medicine without talking to your doctor first, even if you have not had a seizure in a long time.   2. Avoid activities in which a seizure would cause danger to yourself or to others.  Don't operate dangerous machinery, swim alone, or climb in high or dangerous places, such as on ladders, roofs, or girders.  Do not drive unless your doctor says you may.  3. If you have any warning that you may have a seizure, lay down in a safe place where you can't hurt yourself.    4.  No driving for 6 months from last seizure, as per [place]  state law.   Please refer to the following link on the [employer]ile[employer] of [employer]'s website for more information: http://www.epilepsyfoundation.org/answerplace/Social/driving/drivingu.cfm   5.  Maintain good sleep hygiene. Avoid alcohol.  6.  Contact your doctor if you have any problems that may be related to the medicine you are taking.  7.  Call 911 and bring the patient back to the ED if:        A.  The seizure lasts longer than 5 minutes.       B.  The patient doesn't awaken shortly after the seizure  C.  The patient has new problems such as difficulty seeing, speaking or moving  D.  The patient was injured during the seizure  E.  The patient has a temperature over 102 F (39C)  F.  The patient vomited and now is having trouble breathing

## 2023-11-24 NOTE — Progress Notes (Signed)
 NEUROLOGY FOLLOW UP OFFICE NOTE  Kyle [name] [id number] 1955/03/21  HISTORY OF PRESENT ILLNESS: I had the pleasure of seeing Kyle [name] in follow-up in the neurology clinic on 11/24/2023.  The patient was last seen 7 months ago for seizures. He is alone in the office today.  Records and images were personally reviewed where available.  I personally reviewed repeat brain MRI with and without contrast done 06/2023 which did not show any acute changes. 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 There was mild chronic microvascular disease. Since his last visit, he continues to deny any seizures since 06/2022. He was in status epilepticus requiring multiple seizure medications. He continues on Topiramate  150mg  BID (100mg  1.5 tabs BID), Lacosamide  100mg  BID, and clonazepam  0.25mg  at bedtime. He denies any staring/unresponsive episodes, gaps in time, visual/olfactory/gustatory hallucinations, focal numbness/tingling/weakness, myoclonic jerks. No headaches, dizziness, vision changes, no falls. Glucose levels are still up and down but not in the 300s. He reports delivery issues with the clonazepam , he ran out 4 days ago, supposedly the medication package is being delivered today. He feels the clonazepam  also helps him get to sleep faster, he gets 5 hours of sleep. He dozes off on his days off, he works every other week delivering car parts. Mood is okay.    History On Initial Assessment 08/03/2022: This is a pleasant 68 year old right-handed man with a history of hypertension, hyperlipidemia, DM, PAD, presenting for evaluation of new onset seizure. He was in the ER on 4/3 reporting 2 days of headache and right-sided vision changes. He describes intermittent flashing lights with 2 squares of primary colors on the right visual field lasting 30 seconds to 5 minutes. He would get confused, starting to talk then forget what he was trying to say. No focal  numbness/tingling/weakness, jerking movements. He put his hand on the right inferior quadrant and would see a shadow even when the flashes stopped. His wife noticed he would look to the right side and rub his fingers. His glucose levels were in the 300s to 400s due to medication cost. He had a brain MRI without contrast with no acute changes reported. He was back in the ER on 06/21/22 due to an increase in vision changes. During evaluation, he would become very slow to respond but would still be able to interact somewhat. There was variable right gaze preference during these events. He reported flashing lights on the right side of his vision and had a left inferior quadrantanopia on exam even after episodes resolved. MRI brain from 4/3 was reviewed and it was noted that there was a small curvilinear focus of susceptibility artifact in the left occipital lobe, likely a small focus of chronic hemosiderin staining that could serve as seizure focus. 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 He underwent video EEG monitoring from 5/6 to 5/15 with continued seizures occurring every hour, last seizure was 06/29/22. He required multiple seizure medications with increasing doses for seizure control, Levetiracetam , Vimpat , Topiramate , clonazepam , Perampanel . He became increasingly sedated and Levetiracetam  was weaned off, clonazepam  reduced, and Topamax  stopped due to hyperammonemia. Overnight EEG on 5/20-21 showed diffuse slowing. Repeat brain MRI with and without contrast on 06/29/22 again showed subtle abnormality in the left occipital lobe with mildly heterogeneous diffusion and FLAIR, but also a larger area of asymmetrically decreased T2 white matter signal. He had a lumbar puncture with normal  CSF, including paraneoplastic panel. Serum paraneoplastic panel also negative. CT chest/abdomen/pelvis showed a heterogeneously  enhancing mass in the right kidney. Symptoms favoring non-ketotic hyperglycemic occipital seizures. It was recommended to repeat brain MRI and ambulatory EEG to help guide weaning of seizure medications. He was discharged home on 5/24 with note of poorly controlled DM, HbA1c was 11.6. Seizure medications on discharge were Topiramate  150mg  BID, Lacosamide  200mg  BID, Perampanel  4mg  at bedtime, and clonazepam  0.25mg  at bedtime with prn clonazepam  for seizure rescue that he has not needed.   He initially had a headache but these have resolved. He has not had any visual changes since hospital discharge. He denies any prior history of seizures. He denies any headaches, dizziness. He has ran out of Fycompa  for the past 4 days. He continues to fight with insurance for his DM medications. He has not been sleeping well, new since the seizures. No daytime drowsiness. He feels memory is good.   Epilepsy Risk Factors:  His mother had a few seizures later in life.He had a normal birth and early development.  There is no history of febrile convulsions, CNS infections such as meningitis/encephalitis, significant traumatic brain injury, neurosurgical procedures.  Prior ASMs: Levetiracetam , Vimpat   Diagnostic Data: MRI brain from 05/19/22 showed a small curvilinear focus of susceptibility artifact in the left occipital lobe, likely a small focus of chronic hemosiderin staining that could serve as seizure focus.   Repeat brain MRI with and without contrast on 06/29/22 again showed subtle abnormality in the left occipital lobe with mildly heterogeneous diffusion and FLAIR, but also a larger area of asymmetrically decreased T2 white matter signal.   Repeat brain MRI with and without contrast 06/2023 showed resolution of leptomeningeal enhancement in the left occipital region, no acute changes, mild chronic microvascular disease.  EEG in 06/2022 showed recurrent electrographic seizures from the left posterior quadrant lasting  2-3 minutes, averaging 3 seizures every hour.   Routine EEG 07/2022 normal  Video EEG monitoring from 5/6 to 5/15 showed continued seizures occurring every hour, last seizure was 06/29/22. He required multiple seizure medications with increasing doses for seizure control, Levetiracetam , Vimpat , Topiramate , clonazepam , Perampanel .   Lumbar puncture with normal CSF, including paraneoplastic panel. Serum paraneoplastic panel also negative. CT chest/abdomen/pelvis showed a heterogeneously enhancing mass in the right kidney.   PAST MEDICAL HISTORY: Past Medical History:  Diagnosis Date   Arthritis    Cataracts, bilateral    Diabetes mellitus without complication (HCC)    DM (diabetes mellitus) (HCC)    FHx: SVT (supraventricular tachycardia)    Focal seizures (HCC)    hx of   History of basal cell carcinoma of skin    shoulder   HLD (hyperlipidemia)    Hypertension    Migraines    PAD (peripheral artery disease)    Prediabetes     MEDICATIONS: Current Outpatient Medications on File Prior to Visit  Medication Sig Dispense Refill   amiodarone  (PACERONE ) 200 MG tablet Take 1 tablet (200 mg total) by mouth daily. 90 tablet 1   clonazePAM  (KLONOPIN ) 0.25 MG disintegrating tablet Take 1 tablet (0.25 mg total) by mouth at bedtime. 90 tablet 3   gemfibrozil  (LOPID ) 600 MG tablet Take 1 tablet (600 mg total) by mouth 2 (two) times daily before a meal. 180 tablet 1   glipiZIDE  (GLUCOTROL ) 10 MG tablet Take 10 mg by mouth 2 (two) times daily.     hydrALAZINE  (APRESOLINE ) 100 MG tablet Take 100 mg by mouth 3 (three) times daily.  Insulin  Aspart, w/Niacinamide, 100 UNIT/ML SOLN Inject 6 Units into the skin in the morning, at noon, and at bedtime.     Insulin  Degludec FlexTouch 100 UNIT/ML SOPN SMARTSIG:18 Unit(s) SUB-Q Daily     Lacosamide  100 MG TABS Take 1 tablet (100 mg total) by mouth 2 (two) times daily. 180 tablet 3   metFORMIN  (GLUCOPHAGE ) 500 MG tablet Take 2 tablets (1,000 mg total) by  mouth 2 (two) times daily with a meal. 360 tablet 3   metoprolol  succinate (TOPROL -XL) 100 MG 24 hr tablet Take 1 tablet (100 mg total) by mouth daily. Take with or immediately following a meal. 90 tablet 1   olmesartan  (BENICAR ) 20 MG tablet Take 1 tablet (20 mg total) by mouth daily. 90 tablet 3   Omega-3 Fatty Acids  (FISH OIL ) 1000 MG CAPS Take 1 capsule (1,000 mg total) by mouth in the morning and at bedtime. 180 capsule 3   spironolactone  (ALDACTONE ) 100 MG tablet Take 1 tablet (100 mg total) by mouth daily. 90 tablet 3   topiramate  (TOPAMAX ) 100 MG tablet Take 1 and 1/2 tablets twice a day 270 tablet 3   insulin  glargine (LANTUS ) 100 UNIT/ML injection Inject 18 Units into the skin daily. Pt takes in the am (Patient not taking: Reported on 09/15/2023)     oxyCODONE  (ROXICODONE ) 5 MG immediate release tablet Take 1 tablet (5 mg total) by mouth every 6 (six) hours as needed for up to 10 doses for severe pain (pain score 7-10). (Patient not taking: Reported on 11/24/2023) 10 tablet 0   No current facility-administered medications on file prior to visit.    ALLERGIES: Allergies  Allergen Reactions   Statins     Leg cramps   Gabapentin  Palpitations    FAMILY HISTORY: Family History  Problem Relation Age of Onset   Diabetes Father    Hypertension Father    Hypertension Mother    Hypertension Sister    Diabetes Sister        diet controlled    SOCIAL HISTORY: Social History   Socioeconomic History   Marital status: Married    Spouse name: Bruna   Number of children: 2   Years of education: GED   Highest education level: Not on file  Occupational History   Occupation: retired 07/2015    Comment: former truck Hospital doctor, Museum/gallery exhibitions officer   Occupation: drives cars    Comment: [employer], Wednesdays   Occupation: Parts Delivery    Comment: [employer], 3 days/week  Tobacco Use   Smoking status: Former    Types: Cigarettes    Start date: 11/05/2007    Passive  exposure: Current   Smokeless tobacco: Never  Vaping Use   Vaping status: Never Used  Substance and Sexual Activity   Alcohol use: Not Currently   Drug use: No   Sexual activity: Yes    Partners: Female    Birth control/protection: None  Other Topics Concern   Not on file  Social History Narrative   Lives with his wife and their pets.   Daughters live in [place]  and [place] .   Son lives in [place] .   Enjoying doing work around the house and yard.   Is thinking about part time work at some time.   Right handed   Social Drivers of Health   Financial Resource Strain: Low Risk  (07/26/2023)   Received from [facility]   Overall Financial Resource Strain (CARDIA)    Difficulty of Paying Living  Expenses: Not hard at all  Food Insecurity: No Food Insecurity (07/26/2023)   Received from [facility]   Hunger Vital Sign    Within the past 12 months, you worried that your food would run out before you got the money to buy more.: Never true    Within the past 12 months, the food you bought just didn't last and you didn't have money to get more.: Never true  Transportation Needs: No Transportation Needs (07/26/2023)   Received from [facility]   PRAPARE - Transportation    Lack of Transportation (Medical): No    Lack of Transportation (Non-Medical): No  Physical Activity: Sufficiently Active (07/26/2023)   Received from [facility]   Exercise Vital Sign    On average, how many days per week do you engage in moderate to strenuous exercise (like a brisk walk)?: 3 days    On average, how many minutes do you engage in exercise at this level?: 60 min  Stress: No Stress Concern Present (07/26/2023)   Received from [facility] - Occupational Stress Questionnaire    Feeling of Stress : Not at all  Social Connections: Moderately Integrated (07/26/2023)   Received from [facility]   Social Network    How would you rate your social network  (family, work, friends)?: Adequate participation with social networks  Intimate Partner Violence: Not At Risk (07/26/2023)   Received from [facility]   HITS    Over the last 12 months how often did your partner physically hurt you?: Never    Over the last 12 months how often did your partner insult you or talk down to you?: Never    Over the last 12 months how often did your partner threaten you with physical harm?: Never    Over the last 12 months how often did your partner scream or curse at you?: Never     PHYSICAL EXAM: Vitals:   11/24/23 0812  BP: (!) 170/74  Pulse: 66  SpO2: 97%   General: No acute distress Head:  Normocephalic/atraumatic Skin/Extremities: No rash, no edema Neurological Exam: alert and awake. No aphasia or dysarthria. Fund of knowledge is appropriate.  Attention and concentration are normal.   Cranial nerves: Pupils equal, round. Extraocular movements intact with no nystagmus. Visual fields full.  No facial asymmetry.  Motor: Bulk and tone normal, muscle strength 5/5 throughout with no pronator drift.   Finger to nose testing intact.  Gait narrow-based and steady, difficulty with tandem walk.  Romberg negative.   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 He had focal sensory seizures with visual features in the setting of non-ketotic hyperglycemia. MRI brain showed increased signal in the left occipital lobe, repeat MRI in 06/2023 showed resolution of left occipital leptomeningeal enhancement. He denies any seizures or seizure-like symptoms since 06/2022 on Topiramate  150mg  BID, Lacosamide  100mg  BID, and Clonazepam  0.25mg  at bedtime. There are some mailing issues with the clonazepam , we may consider weaning off clonazepam  on next visit to streamline medications. He will call our office in 2 weeks  and we will send in clonazepam  to [facility] at that time. Continue with glucose control. BP today elevated, continue to monitor and follow-up with PCP. We discussed avoidance of seizure triggers. He is aware of  driving laws to stop driving  after a seizure until 6 months seizure-free. Follow-up in 6 months, call for any changes.    Thank you for allowing me to participate in his care.  Please do not hesitate to call for any questions or concerns.  Darice Shivers, M.D.   CC: Bernita Ned, [place]

## 2023-11-28 ENCOUNTER — Ambulatory Visit

## 2023-11-28 VITALS — BP 176/76 | HR 54 | Temp 97.4°F | Resp 18 | Ht 70.5 in | Wt 168.2 lb

## 2023-11-28 DIAGNOSIS — E782 Mixed hyperlipidemia: Secondary | ICD-10-CM

## 2023-11-28 MED ORDER — INCLISIRAN SODIUM 284 MG/1.5ML ~~LOC~~ SOSY
284.0000 mg | PREFILLED_SYRINGE | Freq: Once | SUBCUTANEOUS | Status: AC
  Administered 2023-11-28: 284 mg via SUBCUTANEOUS
  Filled 2023-11-28: qty 1.5

## 2023-11-28 NOTE — Progress Notes (Signed)
 Diagnosis: Hyperlipidemia  Provider:  Mannam, Praveen MD  Procedure: Injection  Leqvio  (inclisiran), Dose: 284 mg, Site: subcutaneous, Number of injections: 1  Injection Site(s): Left arm  Post Care:     Discharge: Condition: Good, Destination: Home . AVS Declined  Performed by:  Virtie Bungert, RN

## 2023-12-08 ENCOUNTER — Other Ambulatory Visit: Payer: Self-pay | Admitting: Neurology

## 2023-12-08 MED ORDER — CLONAZEPAM 0.25 MG PO TBDP: 0.2500 mg | ORAL_TABLET | Freq: Every day | ORAL | 3 refills | Status: DC

## 2023-12-08 NOTE — Telephone Encounter (Signed)
 [facility] Call: [phone]  Caller Name: Declined to provide  Ph: [phone]  Caller states he was told to call after [facility] and he wants the prices [facility] has for them

## 2023-12-08 NOTE — Telephone Encounter (Signed)
 Pt called its been 2 weeks and he needs his RX for clonazepam  changed to [facility]

## 2023-12-13 NOTE — Progress Notes (Signed)
 Reason for visit: Follow up s/p R renal mass Bx and ablation   Care Team: PCP:  Juliane Che, PA Urology: Devere Lonni Righter, MD Cardiology: Raford Riggs, MD and Vannie Reche RAMAN, NP Neurology: Georjean Darice HERO, MD      History of present illness:   [name] is a 68 y.o. 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 Pt w incidental renal masses on occult malignancy workup w CT CAP (06/27/22) after he presented to ER  w seizures. He was seen in urological followup, had multiphase MR abdomen (12/11/22) confirming a 2.4 cm R renal mass suspicious for RCC and ruling out a L renal mass as a benign proteinaceous cyst.   He was referred for minimally invasive treatment given his comorbidities and patient concern for recovery with surgical option. He underwent successful cryoablation and biopsy on 04/13/23. He was last seen in initial post op follow up, and was back to his baseline health. He presents today for imaging follow up and reports to be in his usual state of health. He denies any discomfort, unintentional weight loss, hematuria or localizing symptoms.   Review of Systems: A 12-point ROS discussed, and pertinent positives are indicated in the HPI above.  All other systems are negative  Past Medical History:  Diagnosis Date   Arthritis    Cataracts, bilateral    Diabetes mellitus without complication (HCC)    DM (diabetes mellitus) (HCC)    FHx: SVT (supraventricular tachycardia)    Focal seizures (HCC)    hx of   History of basal cell carcinoma of skin    shoulder   HLD (hyperlipidemia)    Hypertension    Migraines    PAD (peripheral artery disease)    Prediabetes     Past Surgical History:  Procedure Laterality Date   IR RADIOLOGIST EVAL & MGMT  02/11/2023   IR RADIOLOGIST EVAL & MGMT  05/18/2023   IR RADIOLOGIST EVAL & MGMT  12/14/2023   RADIOLOGY WITH ANESTHESIA Right 04/13/2023    Procedure: CT cryoablation;  Surgeon: Hughes Simmonds, MD;  Location: [facility];  Service: Radiology;  Laterality: Right;   SPINE SURGERY  1994   TONSILLECTOMY  1961    Allergies: Statins and Gabapentin   Medications: Prior to Admission medications   Medication Sig Start Date End Date Taking? Authorizing Provider  amiodarone  (PACERONE ) 200 MG tablet Take 1 tablet (200 mg total) by mouth daily. 09/13/22   Vannie Reche RAMAN, NP  clonazePAM  (KLONOPIN ) 0.25 MG disintegrating tablet Take 1 tablet (0.25 mg total) by mouth at bedtime. 12/08/23 01/07/24  Georjean Darice HERO, MD  gemfibrozil  (LOPID ) 600 MG tablet Take 1 tablet (600 mg total) by mouth 2 (two) times daily before a meal. 06/07/17   Juliane Che, PA  glipiZIDE  (GLUCOTROL ) 10 MG tablet Take 10 mg by mouth 2 (two) times daily. 05/09/23   [provider]  hydrALAZINE  (APRESOLINE ) 100 MG tablet Take 100 mg by mouth 3 (three) times daily.    [provider]  Insulin  Aspart, w/Niacinamide, 100 UNIT/ML SOLN Inject 6 Units into the skin in the morning, at noon, and at bedtime. 06/10/23   [provider]  Insulin  Degludec FlexTouch 100 UNIT/ML SOPN SMARTSIG:18 Unit(s) SUB-Q Daily 07/12/23   [provider]  insulin  glargine (LANTUS ) 100 UNIT/ML injection Inject 18 Units into the skin daily. Pt takes in the am Patient not taking: Reported on 09/15/2023  [provider]  Lacosamide  100 MG TABS Take 1 tablet (100 mg total) by mouth 2 (two) times daily. 11/24/23   Georjean Darice HERO, MD  metFORMIN  (GLUCOPHAGE ) 500 MG tablet Take 2 tablets (1,000 mg total) by mouth 2 (two) times daily with a meal. 12/07/16   Juliane Che, PA  metoprolol  succinate (TOPROL -XL) 100 MG 24 hr tablet Take 1 tablet (100 mg total) by mouth daily. Take with or immediately following a meal. 09/13/22 11/24/23  Vannie Reche RAMAN, NP  olmesartan  (BENICAR ) 20 MG tablet Take 1 tablet (20 mg total) by mouth daily. 07/13/23   Raford Riggs, MD  Omega-3  Fatty Acids (FISH OIL ) 1000 MG CAPS Take 1 capsule (1,000 mg total) by mouth in the morning and at bedtime. 07/13/23   Raford Riggs, MD  oxyCODONE  (ROXICODONE ) 5 MG immediate release tablet Take 1 tablet (5 mg total) by mouth every 6 (six) hours as needed for up to 10 doses for severe pain (pain score 7-10). Patient not taking: Reported on 11/24/2023 04/13/23   Emerson, Abigail C, PA-C  spironolactone  (ALDACTONE ) 100 MG tablet Take 1 tablet (100 mg total) by mouth daily. 03/09/23   Vannie Reche RAMAN, NP  topiramate  (TOPAMAX ) 100 MG tablet Take 1 and 1/2 tablets twice a day 11/24/23   Georjean Darice HERO, MD     Family History  Problem Relation Age of Onset   Diabetes Father    Hypertension Father    Hypertension Mother    Hypertension Sister    Diabetes Sister        diet controlled    Social History   Socioeconomic History   Marital status: Married    Spouse name: Bruna   Number of children: 2   Years of education: GED   Highest education level: Not on file  Occupational History   Occupation: retired 07/2015    Comment: former truck hospital doctor, museum/gallery exhibitions officer   Occupation: drives cars    Comment: [employer], Wednesdays   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   Daughters live in [place]  and [place] .   Son lives in [place] .   Enjoying doing work around the house and yard.   Is thinking about part time work at some time.   Right handed   Social Drivers of Health   Financial Resource Strain: Low Risk   (07/26/2023)   Received from [facility]   Overall Financial Resource Strain (CARDIA)    Difficulty of Paying Living Expenses: Not hard at all  Food Insecurity: No Food Insecurity (07/26/2023)   Received from [facility]   Hunger Vital Sign    Within the past 12 months, you worried that your food would run out before you got the money to buy more.: Never true    Within the past 12 months, the food you bought just didn't last and you didn't have money to get more.: Never true  Transportation Needs: No Transportation Needs (07/26/2023)   Received from [facility]   PRAPARE - Transportation    Lack of Transportation (Medical): No    Lack of Transportation (Non-Medical): No  Physical Activity: Sufficiently Active (07/26/2023)   Received from [facility]   Exercise Vital Sign    On average, how many days per week do you engage in moderate to strenuous exercise (like a brisk walk)?: 3 days    On average, how many minutes do you engage in exercise at this level?: 60 min  Stress: No Stress Concern Present (07/26/2023)   Received from [facility] - Occupational Stress Questionnaire    Feeling of Stress : Not at all  Social Connections: Moderately Integrated (07/26/2023)   Received from [facility]   Social Network    How would you rate your social network (family, work, friends)?: Adequate participation with social networks     Vital Signs: BP (!) 189/84 (BP Location: Left Arm, Patient Position: Sitting, Cuff Size: Normal)   Pulse 62   Temp 97.9 F (36.6 C) (Oral)   Resp 20   SpO2 96%   Physical Exam  General: WN, NAD  CV: RRR on monitor Pulm: normal work of breathing on RA Abd: S, ND, NT MSK: Grossly normal Psych: Appropriate affect.  Imaging:    MR Abd W/WO, 09/06/23 IMPRESSION:  1. Interval partial right upper nephrectomy with a 13 mm collection in the surgical bed which demonstrates a 4 mm intrinsically T1 hyperintense  nodular focus without suspicious postcontrast enhancement identified, likely reflecting a small postoperative hematoma. Suggest attention on follow-up imaging.  2. Stable layering hemorrhagic/proteinaceous left renal cyst. No new suspicious renal mass.  No results found.  Labs:  CBC: Recent Labs    04/05/23 1420  WBC 8.2  HGB 12.9*  HCT 37.5*  PLT 268    COAGS: Recent Labs    04/05/23 1420  INR 1.0    BMP: Recent Labs    01/03/23 0805 01/24/23 0844 03/07/23 0809 04/05/23 1420  NA 143 140 143 138  K 4.3 4.7 4.9 4.7  CL 107* 105 108* 108  CO2 18* 17* 20 21*  GLUCOSE 140* 286* 163* 254*  BUN 24 30* 33* 28*  CALCIUM  10.0 10.3* 10.9* 10.1  CREATININE 1.11 1.51* 1.20 1.03  GFRNONAA  --   --   --  >60   ONCOLOGY:   SURGICAL PATHOLOGY CASE: [id number] PATIENT: Kyle [name] Surgical Pathology Report   Clinical History: C/F RCC (las)   FINAL MICROSCOPIC DIAGNOSIS:   A. 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  Incidental renal masses on occult malignancy workup w CT CAP (06/27/22). MR abdomen (12/11/22) confirming a 2.4 cm R renal mass c/f RCC and ruling out a L renal mass as a benign proteinaceous cyst. Baseline sCr ~1.1.   Pt now s/p R renal mass biopsy and cryoablation on 04/13/23 Pathology positive for ccRCC   *Imaging follow up without evidence of residual or recurrent disease. *missed 6 mos interval / 9 mos post ablation imaging *proceed w 9 mos interval / 18 mos post ablation (04/13/23) follow up imaging w multiphasic MR. *Will see him back in clinic, virtual option, for imaging. *Pt will also follow up with his Urologist. *HTN w  SBP >180 on today's visit. Comorbid follow up w PCP   Thank you for allowing us  to participate in the care of this Patient. Please  contact me with questions, concerns, or if new issues arise.   Electronically Signed:  Thom Hall, MD [facility]   Pager. [phone] Clinic. [phone]  I spent a total of 30 Minutes of face-to-face time in clinical consultation, greater than 50% of which was counseling/coordinating care for Mr. Daejon Lich treatment for R renal mass.

## 2023-12-14 ENCOUNTER — Ambulatory Visit
Admission: RE | Admit: 2023-12-14 | Discharge: 2023-12-14 | Disposition: A | Source: Ambulatory Visit | Attending: Interventional Radiology

## 2023-12-14 DIAGNOSIS — N2889 Other specified disorders of kidney and ureter: Secondary | ICD-10-CM

## 2023-12-14 HISTORY — PX: IR RADIOLOGIST EVAL & MGMT: IMG5224

## 2023-12-20 ENCOUNTER — Other Ambulatory Visit (HOSPITAL_COMMUNITY): Payer: Self-pay

## 2023-12-20 ENCOUNTER — Other Ambulatory Visit: Payer: Self-pay | Admitting: Neurology

## 2023-12-20 MED ORDER — CLONAZEPAM 0.25 MG PO TBDP: 0.2500 mg | ORAL_TABLET | Freq: Every day | ORAL | 3 refills | Status: AC

## 2023-12-20 NOTE — Telephone Encounter (Signed)
 Who's calling (name and relationship to patient) : Bruna Scotts; Spouse  Best contact number:  [phone]  Provider they see: [facility]  Reason for call:Pat called in stating she would like for Rx( clonazepam  0.25 mg) sent to [facility] instead of [facility]. She stated he will be out of Rx this Friday.

## 2023-12-21 ENCOUNTER — Encounter (HOSPITAL_BASED_OUTPATIENT_CLINIC_OR_DEPARTMENT_OTHER): Payer: Self-pay

## 2023-12-22 ENCOUNTER — Ambulatory Visit (INDEPENDENT_AMBULATORY_CARE_PROVIDER_SITE_OTHER): Admitting: Family

## 2023-12-22 ENCOUNTER — Encounter (HOSPITAL_BASED_OUTPATIENT_CLINIC_OR_DEPARTMENT_OTHER): Payer: Self-pay | Admitting: Family

## 2023-12-22 VITALS — BP 152/72 | HR 66 | Ht 70.5 in | Wt 168.6 lb

## 2023-12-22 DIAGNOSIS — I10 Essential (primary) hypertension: Secondary | ICD-10-CM

## 2023-12-22 DIAGNOSIS — E876 Hypokalemia: Secondary | ICD-10-CM

## 2023-12-22 DIAGNOSIS — E785 Hyperlipidemia, unspecified: Secondary | ICD-10-CM | POA: Diagnosis not present

## 2023-12-22 DIAGNOSIS — R002 Palpitations: Secondary | ICD-10-CM

## 2023-12-22 DIAGNOSIS — E269 Hyperaldosteronism, unspecified: Secondary | ICD-10-CM | POA: Diagnosis not present

## 2023-12-22 DIAGNOSIS — I471 Supraventricular tachycardia, unspecified: Secondary | ICD-10-CM

## 2023-12-22 MED ORDER — METOPROLOL SUCCINATE ER 100 MG PO TB24: 100.0000 mg | ORAL_TABLET | Freq: Every day | ORAL | 1 refills | Status: AC

## 2023-12-22 MED ORDER — SPIRONOLACTONE 100 MG PO TABS: 100.0000 mg | ORAL_TABLET | Freq: Every day | ORAL | 3 refills | Status: AC

## 2023-12-22 MED ORDER — AMIODARONE HCL 200 MG PO TABS: 200.0000 mg | ORAL_TABLET | Freq: Every day | ORAL | 1 refills | Status: AC

## 2023-12-22 MED ORDER — OLMESARTAN MEDOXOMIL 20 MG PO TABS: 20.0000 mg | ORAL_TABLET | Freq: Every day | ORAL | 3 refills | Status: AC

## 2023-12-22 NOTE — Patient Instructions (Addendum)
 Medication Instructions:  Continue your current medications.   If your blood pressure is consistently more than 130/80 at home, let us  know. If so, we may consider increasing your Olmesartan .    Follow-Up: Please follow up in 6 months in [facility] with Dr. Raford, Reche Finder, NP or Allean Mink PharmD    Special Instructions:   Heart Healthy Diet Recommendations: A low-salt diet is recommended. Meats should be grilled, baked, or boiled. Avoid fried foods. Focus on lean protein sources like fish or chicken with vegetables and fruits. The American Heart Association is a Chief Technology Officer!  American Heart Association Diet and Lifeystyle Recommendations   Exercise recommendations: The American Heart Association recommends 150 minutes of moderate intensity exercise weekly. Try 30 minutes of moderate intensity exercise 4-5 times per week. This could include walking, jogging, or swimming.

## 2023-12-22 NOTE — Progress Notes (Signed)
 Cardiology Office Note:  .   Date:  12/22/2023  ID:  Kyle [name], DOB 05-05-1955, MRN [id number] PCP: Juliane Che, PA  [facility] Providers Cardiologist:  Annabella Scarce, MD Cardiology APP:  Vannie Reche RAMAN, NP    History of Present Illness: .   Kyle [name] is a 68 y.o. male with history of DM 2, HTN, HLD, PAD, SVT, seizure, hyperaldosteronism.   ED visit 05/18/2022 with visual disturbance x 2 days with CT head, MRI brain, neurological test unremarkable.  Presented 06/20/2022 with palpitations, lightheadedness, dizziness, presyncopal episodes.  Found to be in SVT.  He also had focal seizures.  MRI brain 5/14 septal abnormality left occipital lobe.  Continuous EEG demonstrated seizures.  He was started on Toprol , amiodarone  for SVT.  Labetalol  was discontinued.  Lisinopril , spironolactone  held due to AKI which normalized prior to discharge.  Echocardiogram during admission normal LVEF, no significant valvular maladies.  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  His A1c was 11.6 and was discharged on insulin .   He saw pharmacy team 07/23/2022 for lipid management and was started on Vascepa  and Leqvio . At visit 09/13/22 kidney function stable and Spironolactone  resumed at 25mg  daily.    At visit 12/2022 Olmesartan  was added for BP control. At visit 03/07/23 Spironolactone  increased to 100mg  daily. On 04/13/23 had right renal mass biopsy and croablation.   07/13/23 seen by Dr. Scarce. Instructed to purchase new BP cuff for accurate home monitoring. As vascepa  cost prohibitive, fish oil  2,000mg  daily added. Recommended if BP high at follow up consider adrenal vein sampling off spiro and olmesartan  to consider surgical removal of adrenal adenoma  At visit 09/15/23 BP was reasonably controlled - Hydralazine  100mg  TID, Toprol  10mg  daily, Olmesartan  20mg  daily, Spironolactone  100mg  daily continued.   Presents today for follow up  independently. Reports feeling well since last seen. Reports no shortness of breath nor dyspnea on exertion. Reports no chest pain, pressure, or tightness. No edema, orthopnea, PND. Reports no palpitations.  Reports blood pressure at home has been steady with most readings 110s-130s. He attributes high BP today to stressors with traffic on his way to visit. Does not bring specific BP readings to clinic today. Exericing by walking (outside or on his treadmill) as well as lifting heavy parts at work.   ROS: Please see the history of present illness.    All other systems reviewed and are negative.   Studies Reviewed: .             Risk Assessment/Calculations:     HYPERTENSION CONTROL Vitals:   12/22/23 0914 12/22/23 0932  BP: (!) 162/74 (!) 152/72    The patient's blood pressure is elevated above target today.  In order to address the patient's elevated BP: Blood pressure will be monitored at home to determine if medication changes need to be made. (BP controlled at home)          Physical Exam:   VS:  BP (!) 152/72   Pulse 66   Ht 5' 10.5 (1.791 m)   Wt 168 lb 9.6 oz (76.5 kg)   SpO2 97%   BMI 23.85 kg/m    Wt Readings from Last 3 Encounters:  12/22/23 168 lb 9.6 oz (76.5 kg)  11/28/23 168 lb 3.2 oz (76.3 kg)  11/24/23 170 lb (77.1 kg)    GEN: Well nourished, well developed in no acute distress NECK: No JVD; No carotid bruits CARDIAC: RRR, no murmurs, rubs, gallops  RESPIRATORY:  Clear to auscultation without rales, wheezing or rhonchi  ABDOMEN: Soft, non-tender, non-distended EXTREMITIES:  No edema; No deformity   ASSESSMENT AND PLAN: .    SVT / On Amiodarone  therapy- Diagnosed during 06/2022 admission.  Echo 06/2022 normal LVEF, no significant valvular abnormalities.  Monitor 08/2022 with two brief <20 second episodes of SVT which were asymptomatic. No recent palpitations. Amiodarone  refills provided Amiodarone  monitoring: 10/2023 ALT 17, AST14, 07/2023 TSH 1.51  Aortic  atherosclerosis/ HLD, LDL goal <70 / Hypertriglyceridemia- Presently on Fish oil . Vascepa  cost prohibitive.  10/2023 total cholesterol 175, triglycerides 437, HDL 36, LDL 71. Anticipate triglycerides will improve with improved A1c control, as above.   HTN - reports BP at home well controlled Continue Hydralazine  100mg  TID, Toprol  100mg  daily, Olmesartan  20mg  daily, Spironolactone  100mg  daily. Refills provided. If elevated BP in the future, plan to increase Olmesartan  dose to 40mg . 10/31/23 creatinine 1.17, K 4.8. Secondary hypertension workup 2014 renal duplex with no stenosis. CT 06/2022 normal adrenal glands. Hyperaldosteronism, as below.  DM2 - 10/2023 A1c 9.2. Recent issue with insulin  needles which is being addressed by primary care. Continue to follow with PCP.   Hyperaldosteronism / Hypokalemia- prior evaluation at [facility] in 2009 with aldosterone 18.9.  He was started on spironolactone  with subsequent normalization of aldosterone and improvement in BP control.  CT 06/2022 normal adrenal glands. Labs 07/13/23 while on spironolactone  and olmesartan  of aldosterone 17.6, renin 2.477, aldos/renin ratio 7.1 unremarkable. If BP persistently difficult to control, consider holding spironolactone  and olmesartan  with adrenal vein samples. As BP reasonably controlled by recent readings, will defer at this time.        Dispo: follow up in 6 mos with [facility]   Signed, Reche GORMAN Finder, NP

## 2024-01-03 ENCOUNTER — Encounter: Payer: Self-pay | Admitting: Neurology

## 2024-01-09 ENCOUNTER — Other Ambulatory Visit (HOSPITAL_COMMUNITY): Payer: Self-pay

## 2024-01-10 ENCOUNTER — Other Ambulatory Visit (HOSPITAL_COMMUNITY): Payer: Self-pay

## 2024-02-21 ENCOUNTER — Telehealth: Payer: Self-pay

## 2024-02-21 NOTE — Telephone Encounter (Signed)
 Auth Submission: NO AUTH NEEDED Site of care: Site of care: [facility] Payer: Medicare A/B with [employer] supplement Medication & CPT/J Code(s) submitted: Leqvio  (Inclisiran) J1306 Diagnosis Code:  Route of submission (phone, fax, portal):  Phone # Fax # Auth type: Buy/Bill PB Units/visits requested: 284mg  x 2 doses Reference number:  Approval from: 02/21/24 to 03/17/25

## 2024-05-28 ENCOUNTER — Ambulatory Visit

## 2024-06-13 ENCOUNTER — Ambulatory Visit: Admitting: Neurology
